# Patient Record
Sex: Female | Born: 1940 | Race: White | Hispanic: No | Smoking: Never smoker
Health system: Southern US, Community
[De-identification: ages and names within clinical notes are randomized; demographics above are authoritative.]

## PROBLEM LIST (undated history)

## (undated) DIAGNOSIS — I10 Essential (primary) hypertension: Secondary | ICD-10-CM

## (undated) DIAGNOSIS — Z780 Asymptomatic menopausal state: Secondary | ICD-10-CM

## (undated) DIAGNOSIS — M1990 Unspecified osteoarthritis, unspecified site: Secondary | ICD-10-CM

## (undated) DIAGNOSIS — E785 Hyperlipidemia, unspecified: Secondary | ICD-10-CM

## (undated) DIAGNOSIS — E079 Disorder of thyroid, unspecified: Secondary | ICD-10-CM

## (undated) DIAGNOSIS — M8580 Other specified disorders of bone density and structure, unspecified site: Secondary | ICD-10-CM

## (undated) DIAGNOSIS — F329 Major depressive disorder, single episode, unspecified: Secondary | ICD-10-CM

## (undated) DIAGNOSIS — I4891 Unspecified atrial fibrillation: Secondary | ICD-10-CM

## (undated) DIAGNOSIS — L301 Dyshidrosis [pompholyx]: Secondary | ICD-10-CM

## (undated) DIAGNOSIS — IMO0001 Reserved for inherently not codable concepts without codable children: Secondary | ICD-10-CM

## (undated) DIAGNOSIS — C07 Malignant neoplasm of parotid gland: Secondary | ICD-10-CM

## (undated) DIAGNOSIS — F419 Anxiety disorder, unspecified: Secondary | ICD-10-CM

## (undated) DIAGNOSIS — H269 Unspecified cataract: Secondary | ICD-10-CM

## (undated) DIAGNOSIS — T7840XA Allergy, unspecified, initial encounter: Secondary | ICD-10-CM

## (undated) DIAGNOSIS — E119 Type 2 diabetes mellitus without complications: Secondary | ICD-10-CM

## (undated) HISTORY — DX: Allergy, unspecified, initial encounter: T78.40XA

## (undated) HISTORY — DX: Anxiety disorder, unspecified: F41.9

## (undated) HISTORY — DX: Essential (primary) hypertension: I10

## (undated) HISTORY — PX: EYE SURGERY: SHX253

## (undated) HISTORY — DX: Other specified disorders of bone density and structure, unspecified site: M85.80

## (undated) HISTORY — DX: Type 2 diabetes mellitus without complications: E11.9

## (undated) HISTORY — PX: LUMBAR DISC SURGERY: SHX700

## (undated) HISTORY — PX: ABLATION: SHX5711

## (undated) HISTORY — DX: Disorder of thyroid, unspecified: E07.9

## (undated) HISTORY — PX: ROTATOR CUFF REPAIR: SHX139

## (undated) HISTORY — DX: Hyperlipidemia, unspecified: E78.5

## (undated) HISTORY — PX: ABDOMINAL HYSTERECTOMY: SHX81

## (undated) HISTORY — DX: Dyshidrosis (pompholyx): L30.1

## (undated) HISTORY — DX: Asymptomatic menopausal state: Z78.0

## (undated) HISTORY — DX: Major depressive disorder, single episode, unspecified: F32.9

## (undated) HISTORY — PX: OTHER SURGICAL HISTORY: SHX169

## (undated) HISTORY — DX: Unspecified atrial fibrillation: I48.91

## (undated) HISTORY — DX: Malignant neoplasm of parotid gland: C07

## (undated) HISTORY — DX: Unspecified cataract: H26.9

## (undated) HISTORY — DX: Reserved for inherently not codable concepts without codable children: IMO0001

## (undated) HISTORY — DX: Unspecified osteoarthritis, unspecified site: M19.90

## (undated) HISTORY — PX: TUBAL LIGATION: SHX77

---

## 1997-12-08 ENCOUNTER — Other Ambulatory Visit: Admission: RE | Admit: 1997-12-08 | Discharge: 1997-12-08 | Payer: Self-pay

## 1999-01-05 ENCOUNTER — Other Ambulatory Visit: Admission: RE | Admit: 1999-01-05 | Discharge: 1999-01-05 | Payer: Self-pay | Admitting: Family Medicine

## 2000-01-19 ENCOUNTER — Other Ambulatory Visit: Admission: RE | Admit: 2000-01-19 | Discharge: 2000-01-19 | Payer: Self-pay | Admitting: Family Medicine

## 2001-02-11 ENCOUNTER — Other Ambulatory Visit: Admission: RE | Admit: 2001-02-11 | Discharge: 2001-02-11 | Payer: Self-pay | Admitting: Family Medicine

## 2002-05-18 ENCOUNTER — Other Ambulatory Visit: Admission: RE | Admit: 2002-05-18 | Discharge: 2002-05-18 | Payer: Self-pay | Admitting: Obstetrics and Gynecology

## 2003-06-09 ENCOUNTER — Other Ambulatory Visit: Admission: RE | Admit: 2003-06-09 | Discharge: 2003-06-09 | Payer: Self-pay | Admitting: Obstetrics and Gynecology

## 2004-07-25 ENCOUNTER — Other Ambulatory Visit: Admission: RE | Admit: 2004-07-25 | Discharge: 2004-07-25 | Payer: Self-pay | Admitting: Obstetrics and Gynecology

## 2005-01-01 ENCOUNTER — Other Ambulatory Visit: Admission: RE | Admit: 2005-01-01 | Discharge: 2005-01-01 | Payer: Self-pay | Admitting: Obstetrics and Gynecology

## 2008-03-03 ENCOUNTER — Ambulatory Visit: Payer: Self-pay | Admitting: Cardiology

## 2008-03-15 ENCOUNTER — Ambulatory Visit: Payer: Self-pay

## 2009-08-19 ENCOUNTER — Encounter (INDEPENDENT_AMBULATORY_CARE_PROVIDER_SITE_OTHER): Payer: Self-pay | Admitting: *Deleted

## 2009-10-20 ENCOUNTER — Encounter (INDEPENDENT_AMBULATORY_CARE_PROVIDER_SITE_OTHER): Payer: Self-pay | Admitting: *Deleted

## 2009-10-20 ENCOUNTER — Ambulatory Visit: Payer: Self-pay | Admitting: Gastroenterology

## 2009-10-31 ENCOUNTER — Ambulatory Visit: Payer: Self-pay | Admitting: Gastroenterology

## 2009-11-01 ENCOUNTER — Encounter: Payer: Self-pay | Admitting: Gastroenterology

## 2010-08-01 NOTE — Procedures (Signed)
Summary: Colonoscopy  Patient: [name] Note: All result statuses are Final unless otherwise noted.  Tests: (1) Colonoscopy (COL)   COL Colonoscopy           DONE      [facility]     [address]     [place], [place]  [address]           COLONOSCOPY PROCEDURE REPORT           PATIENT:  Meredith Anderson, Meredith Anderson  MR#:  [id number]     BIRTHDATE:  [birth year], 68 yrs. old  GENDER:  female     ENDOSCOPIST:  Vania Rea. Jarold Motto, MD, [facility]     REF. BY:  Rudi Heap, M.D.     PROCEDURE DATE:  10/31/2009     PROCEDURE:  Colonoscopy with snare polypectomy, Average-risk     screening colonoscopy     G0121     ASA CLASS:  Class III     INDICATIONS:  Routine Risk Screening     MEDICATIONS:   Fentanyl 25 mcg IV, Versed 3 mg IV           DESCRIPTION OF PROCEDURE:   After the risks benefits and     alternatives of the procedure were thoroughly explained, informed     consent was obtained.  Digital rectal exam was performed and     revealed no abnormalities.   The LB [id number] endoscope     was introduced through the anus and advanced to the cecum, which     was identified by both the appendix and ileocecal valve, without     limitations.  The quality of the prep was excellent, using     MoviPrep.  The instrument was then slowly withdrawn as the colon     was fully examined.     <<PROCEDUREIMAGES>>           FINDINGS:  A diminutive polyp was found in the cecum. cold snare     excised and retrieved.   Retroflexed views in the rectum revealed     no abnormalities.    The scope was then withdrawn from the patient     and the procedure completed.           COMPLICATIONS:  None     ENDOSCOPIC IMPRESSION:     1) Diminutive polyp in the cecum     R/O ADENOMA.     RECOMMENDATIONS:     RESUME COUMADIN.PRN F/U     REPEAT EXAM:  No           ______________________________     Vania Rea. Jarold Motto, MD, Clement[facility] Graham           CC:           n.     eSIGNED:   Vania Rea. Roise Emert at 10/31/2009 10:21  AM           Eulis Foster, [id number]  Note: An exclamation mark (!) indicates a result that was not dispersed into the flowsheet. Document Creation Date: 10/31/2009 10:21 AM _______________________________________________________________________  (1) Order result status: Final Collection or observation date-time: 10/31/2009 10:14 Requested date-time:  Receipt date-time:  Reported date-time:  Referring Physician:   Ordering Physician: Sheryn Bison [phone]) Specimen Source:  Source: Launa Grill Order Number: [id number] Lab site:   Appended Document: Colonoscopy     Procedures Next Due Date:    Colonoscopy: 10/2014

## 2010-08-01 NOTE — Letter (Signed)
Summary: [facility] Instructions  [facility]  [address] [place], [place] [address]   Phone: [phone]  Fax: [phone]       Meredith Anderson    1941/05/06    MRN: [id number]        Procedure Day /Date: Monday, 10/31/09     Arrival Time: 8:30     Procedure Time: 9:30     Location of Procedure:                    [facility] ([facility] _                       PREPARATION FOR COLONOSCOPY WITH MOVIPREP   Starting 5 days prior to your procedure 10/26/09 do not eat nuts, seeds, popcorn, corn, beans, peas,  salads, or any raw vegetables.  Do not take any fiber supplements (e.g. Metamucil, Citrucel, and Benefiber).  THE DAY BEFORE YOUR PROCEDURE         DATE: 10/30/09   DAY: Sunday  1.  Drink clear liquids the entire day-NO SOLID FOOD  2.  Do not drink anything colored red or purple.  Avoid juices with pulp.  No orange juice.  3.  Drink at least 64 oz. (8 glasses) of fluid/clear liquids during the day to prevent dehydration and help the prep work efficiently.  CLEAR LIQUIDS INCLUDE: Water Jello Ice Popsicles Tea (sugar ok, no milk/cream) Powdered fruit flavored drinks Coffee (sugar ok, no milk/cream) Gatorade Juice: apple, white grape, white cranberry  Lemonade Clear bullion, consomm, broth Carbonated beverages (any kind) Strained chicken noodle soup Hard Candy                             4.  In the morning, mix first dose of MoviPrep solution:    Empty 1 Pouch A and 1 Pouch B into the disposable container    Add lukewarm drinking water to the top line of the container. Mix to dissolve    Refrigerate (mixed solution should be used within 24 hrs)  5.  Begin drinking the prep at 5:00 p.m. The MoviPrep container is divided by 4 marks.   Every 15 minutes drink the solution down to the next mark (approximately 8 oz) until the full liter is complete.   6.  Follow completed prep with 16 oz of clear liquid of your choice (Nothing red or  purple).  Continue to drink clear liquids until bedtime.  7.  Before going to bed, mix second dose of MoviPrep solution:    Empty 1 Pouch A and 1 Pouch B into the disposable container    Add lukewarm drinking water to the top line of the container. Mix to dissolve    Refrigerate  THE DAY OF YOUR PROCEDURE      DATE: 10/31/09   DAY: Monday  Beginning at 4:30 a.m. (5 hours before procedure):         1. Every 15 minutes, drink the solution down to the next mark (approx 8 oz) until the full liter is complete.  2. Follow completed prep with 16 oz. of clear liquid of your choice.    3. You may drink clear liquids until 7:30 (2 HOURS BEFORE PROCEDURE).   MEDICATION INSTRUCTIONS  Unless otherwise instructed, you should take regular prescription medications with a small sip of water   as early as possible the morning of  your procedure.  Diabetic patients - see separate instructions.      Stop taking Coumadin on  10/26/09  (5 days before procedure).           OTHER INSTRUCTIONS  You will need a responsible adult at least 70 years of age to accompany you and drive you home.   This person must remain in the waiting room during your procedure.  Wear loose fitting clothing that is easily removed.  Leave jewelry and other valuables at home.  However, you may wish to bring a book to read or  an iPod/MP3 player to listen to music as you wait for your procedure to start.  Remove all body piercing jewelry and leave at home.  Total time from sign-in until discharge is approximately 2-3 hours.  You should go home directly after your procedure and rest.  You can resume normal activities the  day after your procedure.  The day of your procedure you should not:   Drive   Make legal decisions   Operate machinery   Drink alcohol   Return to work  You will receive specific instructions about eating, activities and medications before you leave.    The above instructions have  been reviewed and explained to me by   _______________________    I fully understand and can verbalize these instructions _____________________________ Date _________

## 2010-08-01 NOTE — Letter (Signed)
Summary: Diabetic Instructions  [facility]  [address] [place], [place] [address]   Phone: [phone]  Fax: [phone]    Meredith Anderson January 29, 1941 MRN: [id number]   _ X _   ORAL DIABETIC MEDICATION INSTRUCTIONS  The day before your procedure:   Take your diabetic pill as you do normally  The day of your procedure:   Do not take your diabetic pill    We will check your blood sugar levels during the admission process and again in Recovery before discharging you home  ________________________________________________________________________  _  _   INSULIN (LONG ACTING) MEDICATION INSTRUCTIONS (Lantus, NPH, 70/30, Humulin, Novolin-N)   The day before your procedure:   Take  your regular evening dose    The day of your procedure:   Do not take your morning dose    _  _   INSULIN (SHORT ACTING) MEDICATION INSTRUCTIONS (Regular, Humulog, Novolog)   The day before your procedure:   Do not take your evening dose   The day of your procedure:   Do not take your morning dose   _  _   INSULIN PUMP MEDICATION INSTRUCTIONS  We will contact the physician managing your diabetic care for written dosage instructions for the day before your procedure and the day of your procedure.  Once we have received the instructions, we will contact you.

## 2010-08-01 NOTE — Assessment & Plan Note (Signed)
Summary: discuss colon on BT--ch.   History of Present Illness Visit Type: Initial Consult Primary GI MD: Sheryn Bison MD FACP FAGA Primary Provider: Rudi Heap, MD Requesting Provider: Rudi Heap, MD Chief Complaint: screening for colonoscopy on blood thinner History of Present Illness:   Pleasant 70 year old white female referred by Dr. Alden Server for possible screening colonoscopy.  This patient is a middle-aged Caucasian female with adult onset diabetes, atrial fibrillation requiring Coumadin therapy, chronic thyroid dysfunction, hypertension, hyperlipidemia, osteoporosis, and peripheral arthritis. She denies any GI complaints but has never had screening colonoscopy. She specifically denies abdominal pain, change in bowel habits, upper abdominal or hepatobiliary symptomatology. Her appetite is good and her weight is stable. Family history is noncontributory.  She is followed at [facility] by Dr. Rozell Searing for atrial fibrillation. She has not had previous cardioversion, and she denies a previous history of embolic problems. She also denies a current cardiovascular or pulmonary complaints. In addition to Coumadin she is on a daily aspirin 81 mg. She relates that Hemoccult cards in the past been guaiac-negative, and she has no history of anemia.   GI Review of Systems    Reports acid reflux and  bloating.      Denies abdominal pain, belching, chest pain, dysphagia with liquids, dysphagia with solids, heartburn, loss of appetite, nausea, vomiting, vomiting blood, weight loss, and  weight gain.      Reports diarrhea and  hemorrhoids.     Denies anal fissure, black tarry stools, change in bowel habit, constipation, diverticulosis, fecal incontinence, heme positive stool, irritable bowel syndrome, jaundice, light color stool, liver problems, rectal bleeding, and  rectal pain.    Current Medications (verified): 1)  Aspir-Low 81 Mg Tbec (Aspirin) .... Daily 2)  Warfarin  Sodium 2.5 Mg Tabs (Warfarin Sodium) .... Qd 3)  Pantoprazole Sodium 40 Mg Tbec (Pantoprazole Sodium) .... Qd 4)  Glipizide Xl 5 Mg Xr24h-Tab (Glipizide) .... Qd 5)  Actos 30 Mg Tabs (Pioglitazone Hcl) .... 1/2 Qd 6)  Sotalol Hcl 80 Mg Tabs (Sotalol Hcl) .... Bid 7)  Levoxyl 88 Mcg Tabs (Levothyroxine Sodium) .... Qd 8)  Zestoretic 20-25 Mg Tabs (Lisinopril-Hydrochlorothiazide) .... Qd 9)  Actos 15 Mg Tabs (Pioglitazone Hcl) .Marland Kitchen.. 1 Tablet By Mouth Once Daily 10)  Lipitor 10 Mg Tabs (Atorvastatin Calcium) .Marland Kitchen.. 1 Tablet By Mouth Once Daily 11)  Calcium 600+d Plus Minerals 600-400 Mg-Unit Tabs (Calcium Carbonate-Vit D-Min) .Marland Kitchen.. 1 Tablet By Mouth Once Weekly  Allergies (verified): No Known Drug Allergies  Past History:  Past medical, surgical, family and social histories (including risk factors) reviewed for relevance to current acute and chronic problems.  Past Medical History: Arthritis Atrial Fibrillation Diabetes Hemorrhoids Hyperlipidemia Hypertension Left Parotiod Radiation Hypothyroidism  Past Surgical History: Tubal Ligation Catarqct surgery x 2  Rotator cuff surgery  Rt. Shoulder Disc surgery 4-5   Family History: Reviewed history and no changes required. Family History of Diabetes: daughter, sisters , brother and mother Family History of Heart Disease: father No FH of Colon Cancer:  Social History: Reviewed history and no changes required. Divorced 2 girls Retired Patient has never smoked.  Alcohol Use - no Daily Caffeine Use 2 per day Illicit Drug Use - no Smoking Status:  never Drug Use:  no  Review of Systems       The patient complains of allergy/sinus, arthritis/joint pain, back pain, and change in vision.  The patient denies anemia, anxiety-new, blood in urine, breast changes/lumps, confusion, cough, coughing up blood, depression-new, fainting, fatigue,  fever, headaches-new, hearing problems, heart murmur, heart rhythm changes, itching, muscle  pains/cramps, night sweats, nosebleeds, shortness of breath, skin rash, sleeping problems, sore throat, swelling of feet/legs, swollen lymph glands, thirst - excessive, urination - excessive, urination changes/pain, urine leakage, vision changes, and voice change.    Vital Signs:  Patient profile:   70 year old female Height:      64 inches Weight:      173 pounds BMI:     29.80 Pulse rate:   56 / minute Pulse rhythm:   regular BP sitting:   152 / 84  (left arm)  Vitals Entered By: Milford Cage NCMA (October 20, 2009 9:48 AM)  Physical Exam  General:  Well developed, well nourished, no acute distress.healthy appearing.   Head:  Normocephalic and atraumatic. Eyes:  PERRLA, no icterus.exam deferred to patient's ophthalmologist.   Neck:  Supple; no masses or thyromegaly. Lungs:  Clear throughout to auscultation. Heart:  ; no murmurs, rubs,  or bruits.Distant heart sounds noted.irregular rhythm:.   Abdomen:  Soft, nontender and nondistended. No masses, hepatosplenomegaly or hernias noted. Normal bowel sounds. Pulses:  Normal pulses noted. Extremities:  No clubbing, cyanosis, edema or deformities noted. Neurologic:  Alert and  oriented x4;  grossly normal neurologically. Cervical Nodes:  No significant cervical adenopathy. Psych:  Alert and cooperative. Normal mood and affect.   Impression & Recommendations:  Problem # 1:  SCREENING COLORECTAL-CANCER (ICD-V76.51) Assessment Unchanged Because of Her Age She will need screening colonoscopy despite increased risk from associated adult onset diabetes and her chronic Coumadin administration because of atrial fibrillation. We will make adjustments in her medications for colonoscopy procedure in preparation with a balanced electrolyte solution. Unless otherwise notified, we will hold her Coumadin 5 days before her procedure but continue her salicylate therapy. I've explained the risk and benefits of colonoscopy and possible polypectomy to the  patient and her daughter in detail. They've agreed to proceed as planned. Of note is the fact that the patient also is on pantoprazole therapy for reasons which are unclear after speaking with her. We will send this note to Dr.Karl at [facility] cardiology Department for review. She seems well compensated at this time in terms of any cardiovascular or pulmonary compromise. Also her diabetes apparently is under good control.  Patient Instructions: 1)  You are scheduled for a colonoscopy. 2)  You will need to stop coumadin 5 days before the procedure. 3)  Pick your prep up from your pharmacy,. 4)  The medication list was reviewed and reconciled.  All changed / newly prescribed medications were explained.  A complete medication list was provided to the patient / caregiver. 5)  Copy sent to : Dr. Rudi Heap and Dr. Rozell Searing division of cardiology at [facility]. 6)  Please continue current medications.  7)  Colonoscopy and Flexible Sigmoidoscopy brochure given.  8)  Conscious Sedation brochure given.   Appended Document: discuss colon on BT--ch.    Clinical Lists Changes  Medications: Added new medication of MOVIPREP 100 GM  SOLR (PEG-KCL-NACL-NASULF-NA ASC-C) As per prep instructions. - Signed Rx of MOVIPREP 100 GM  SOLR (PEG-KCL-NACL-NASULF-NA ASC-C) As per prep instructions.;  #1 x 0;  Signed;  Entered by: Ashok Cordia RN;  Authorized by: Mardella Layman MD [facility];  Method used: Electronically to [facility] [phone]*, [address], [place], [place], [place]  [address], Ph: [phone] or [phone], Fax: [phone] Orders: Added new Test order of Colonoscopy (  Colon) - Signed    Prescriptions: MOVIPREP 100 GM  SOLR (PEG-KCL-NACL-NASULF-NA ASC-C) As per prep instructions.  #1 x 0   Entered by:   Ashok Cordia RN   Authorized by:   Mardella Layman MD [facility]   Signed by:   Ashok Cordia RN on 10/20/2009   Method used:    Electronically to        [facility] [phone]* (retail)       [address] [place], [place]  [address]       Ph: [phone] or [phone]       Fax: [phone]   RxID:   [id number]

## 2010-08-01 NOTE — Letter (Signed)
Summary: Patient Notice- Polyp Results  [facility]  [address] [place], [place] [address]   Phone: [phone]  Fax: [phone]        Nov 01, 2009 MRN: [id number]    [facility] [address] [place], [place]  [address]    Dear Ms. Newsome,  I am pleased to inform you that the colon polyp(s) removed during your recent colonoscopy was (were) found to be benign (no cancer detected) upon pathologic examination.  I recommend you have a repeat colonoscopy examination in 5_ years to look for recurrent polyps, as having colon polyps increases your risk for having recurrent polyps or even colon cancer in the future.  Should you develop new or worsening symptoms of abdominal pain, bowel habit changes or bleeding from the rectum or bowels, please schedule an evaluation with either your primary care physician or with me.  Additional information/recommendations:  _x_ No further action with gastroenterology is needed at this time. Please      follow-up with your primary care physician for your other healthcare      needs.  __ Please call [phone] to schedule a return visit to review your      situation.  __ Please keep your follow-up visit as already scheduled.  __ Continue treatment plan as outlined the day of your exam.  Please call us if you are having persistent problems or have questions about your condition that have not been fully answered at this time.  Sincerely,  Mardella Layman MD [facility]  This letter has been electronically signed by your physician.  Appended Document: Patient Notice- Polyp Results letter mailed 5.5.11  Appended Document: Patient Notice- Polyp Results Nov 01, 2009 MRN: [id number]    [facility] [address] [place], [place]  [address]    Dear Ms. Kenedy,  I am pleased to inform you that the colon polyp(s) removed during your recent colonoscopy was (were) found to be benign (no cancer detected) upon pathologic  examination.  I recommend you have a repeat colonoscopy examination in 5_ years to look for recurrent polyps, as having colon polyps increases your risk for having recurrent polyps or even colon cancer in the future.  Should you develop new or worsening symptoms of abdominal pain, bowel habit changes or bleeding from the rectum or bowels, please schedule an evaluation with either your primary care physician or with me.  Additional information/recommendations:  _x_ No further action with gastroenterology is needed at this time. Please      follow-up with your primary care physician for your other healthcare      needs.  __ Please call [phone] to schedule a return visit to review your      situation.  __ Please keep your follow-up visit as already scheduled.  __ Continue treatment plan as outlined the day of your exam.  Please call us if you are having persistent problems or have questions about your condition that have not been fully answered at this time.  Sincerely,  Mardella Layman MD [facility]  This letter has been electronically signed by your physician.    MAILED TO PATIENTS P.O BOX

## 2010-08-01 NOTE — Letter (Signed)
Summary: New Patient letter  [facility]  [address] [place], [place] [address]   Phone: [phone]  Fax: [phone]       08/19/2009 MRN: [id number]  [facility] [place], [place]  [address]  Dear Meredith Anderson,  Welcome to the Gastroenterology Division at [facility].    You are scheduled to see Dr. Jarold Motto on 10-18-09 at 10:00a.m. on the 3rd floor at [facility], [address]  We ask that you try to arrive at our office 15 minutes prior to your appointment time to allow for check-in.  We would like you to complete the enclosed self-administered evaluation form prior to your visit and bring it with you on the day of your appointment.  We will review it with you.  Also, please bring a complete list of all your medications or, if you prefer, bring the medication bottles and we will list them.  Please bring your insurance card so that we may make a copy of it.  If your insurance requires a referral to see a specialist, please bring your referral form from your primary care physician.  Co-payments are due at the time of your visit and may be paid by cash, check or credit card.     Your office visit will consist of a consult with your physician (includes a physical exam), any laboratory testing he/she may order, scheduling of any necessary diagnostic testing (e.g. x-ray, ultrasound, CT-scan), and scheduling of a procedure (e.g. Endoscopy, Colonoscopy) if required.  Please allow enough time on your schedule to allow for any/all of these possibilities.    If you cannot keep your appointment, please call [phone] to cancel or reschedule prior to your appointment date.  This allows [place] the opportunity to schedule an appointment for another patient in need of care.  If you do not cancel or reschedule by 5 p.m. the business day prior to your appointment date, you will be charged a $50.00 late cancellation/no-show fee.    Thank you for  choosing [facility] for your medical needs.  We appreciate the opportunity to care for you.  Please visit [place] at our website  to learn more about our practice.                     Sincerely,                                                             The Gastroenterology Division

## 2010-11-14 NOTE — Assessment & Plan Note (Signed)
[facility]                            CARDIOLOGY OFFICE NOTE   Meredith Anderson, Meredith Anderson                         MRN:          [id number]  DATE:03/03/2008                            DOB:          [birth year]    REASON FOR PRESENTATION:  Evaluate the patient with chest pain.   HISTORY OF PRESENT ILLNESS:  The patient is 70 years old.  She has no  history of coronary artery diseases.  She has multiple cardiovascular  risk factors.  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  She was told she had heart murmurs as a child, but it went away as an  adult.  She has never had an echocardiogram, this is the reason to have  one.   On February 09, 2008, she developed chest discomfort.  This was at rest at  8:30 p.m.  She was watching TV.  She felt a severe chest tightness or  fullness.  It was left-sided.  It did not radiate to her jaw or to her  arms.  It was unlike previous indigestion, though she did feel like she  needed to burp.  She could not.  She did feel her heart racing.  She  said it was 10/10 in intensity.  She was short of breath.  There was no  associated nausea or vomiting.  It went away spontaneously.  She did  present it on the next day and was not found to have any acute findings.  She did not have any recurrence of this.  She states she is otherwise  able to be active and has been able to bring on these symptoms.  She  denies any resting PND or orthopnea.  She had no palpitations,  presyncope, or syncope.   PAST MEDICAL HISTORY:  Hypertension since 1980s, hyperlipidemia since  1990s, hypothyroidism, recently elevated blood sugar (though not a  fasting level).   PAST SURGICAL HISTORY:  Tubal; bunionectomy bilaterally; back surgery,  lumbar; and cataract surgery.   ALLERGIES:  None.   MEDICATIONS:  1. Benicar 20 mg daily (just started).  2. Zetia 10 mg daily.  3. Crestor 10 mg daily.  4. Aspirin 81 mg daily.  5. Tylenol.  6. Levothyroxine 75 mcg daily.  7. Lisinopril HCT 20/25 daily.   SOCIAL HISTORY:  The patient is retired.  She is single.  She has 2  children.  She never really smoked cigarettes and does not drink  alcohol.   FAMILY HISTORY:  Contributory for her brother having emergent bypass in  his 15s.  Her father had heart disease at a later age, though she cannot  describe this in detail.   REVIEW OF SYSTEMS:  Positive for occasional dizziness, varicose veins,  and reflux.  Negative for all other systems.   PHYSICAL EXAMINATION:  GENERAL:  The patient is in no distress.  VITAL SIGNS:  Blood pressure 150/88, heart rate 76 and regular, weight  175 pounds, and body mass index 29.  HEENT:  Eyes unremarkable.  Pupils equal, round, and reactive to light.  Fundi not visualized.  Oral mucosa unremarkable.  NECK:  No jugular venous distension at 45 degrees, carotid upstroke  brisk and symmetrical.  No bruit.  No thyromegaly.  LYMPHATIC:  No cervical, axillary, or inguinal lymphadenopathy.  LUNGS:  Clear to auscultation bilaterally.  BACK:  No costovertebral angle tenderness.  CHEST:  Unremarkable.  HEART:  PMI not displaced or sustained.  S1 and S2 within normal limits.  No S3.  No S4.  No clicks.  No rubs.  No murmurs.  ABDOMEN:  Flat, positive bowel sounds, normal in frequency and pitch.  No bruits.  No rebound.  No guarding.  No midline pulsatile mass.  No  hepatomegaly.  No splenomegaly.  SKIN:  No rashes.  No nodules.  EXTREMITIES:  Pulses 2+ throughout.  No edema.  No cyanosis or clubbing.  NEUROLOGIC:  Oriented to place, person, and time.  Cranial nerves II  through XII grossly intact.  Motor grossly intact.   LABORATORY DATA:  EKG (done by Ms. Steadman) sinus rhythm, rate 64, axis  within normal limits, intervals within normal limits, poor anterior R-  wave progression.  No acute ST-T wave changes.   ASSESSMENT AND PLAN:   1. Chest discomfort.  The patient's chest discomfort has some      worrisome features.  She has a strong family history of coronary      disease.  She has multiple cardiovascular risk factors that she is      not taking care of aggressively.  Given this, her pretest probably      of obstructive coronary artery disease is at least moderately high.      Therefore, screening with a stress perfusion study is indicated.      The patient would be able to walk on a treadmill and will be      scheduled for an exercise perfusion study.  2. Hypertension.  Blood pressure is elevated.  She was recently      started on the Benicar.  I will defer to Ms. Steadman.  I would      suggest based on recent trials to use a combination other than ACEs      plus ARBs.  Perhaps, spironolactone or calcium channel blocker.  3. Non-insulin-dependent diabetes mellitus.  It has been diet      controlled in the past.  She is going to have this followed by Ms.      Bevelyn Buckles and is planning to have repeat fasting blood work.  4. Dyslipidemia.  I would say given her diabetes and family history,      the another risk factor of the goal should be an LDL less than 100      and an HDL greater than 50.  5. Hypothyroidism.  The patient is to continue on her Synthroid.   FOLLOWUP:  I will see the patient again as needed based on the results  of the above tests or future symptoms.     Rollene Rotunda, MD, [facility]  Electronically Signed    JH/MedQ  DD: 03/03/2008  DT: 03/04/2008  Job #: [id number]   cc:   Paulita Cradle

## 2010-11-15 ENCOUNTER — Encounter: Payer: Self-pay | Admitting: Family Medicine

## 2011-07-30 DIAGNOSIS — Z9889 Other specified postprocedural states: Secondary | ICD-10-CM | POA: Insufficient documentation

## 2011-07-30 DIAGNOSIS — E119 Type 2 diabetes mellitus without complications: Secondary | ICD-10-CM | POA: Insufficient documentation

## 2011-11-27 DIAGNOSIS — IMO0001 Reserved for inherently not codable concepts without codable children: Secondary | ICD-10-CM | POA: Insufficient documentation

## 2012-02-15 DIAGNOSIS — M545 Low back pain, unspecified: Secondary | ICD-10-CM | POA: Insufficient documentation

## 2012-02-15 DIAGNOSIS — M5136 Other intervertebral disc degeneration, lumbar region: Secondary | ICD-10-CM | POA: Insufficient documentation

## 2012-10-13 ENCOUNTER — Other Ambulatory Visit: Payer: Self-pay | Admitting: *Deleted

## 2012-10-13 MED ORDER — ATORVASTATIN CALCIUM 10 MG PO TABS: 10.0000 mg | ORAL_TABLET | Freq: Every day | ORAL | Status: DC

## 2012-10-27 DIAGNOSIS — H5319 Other subjective visual disturbances: Secondary | ICD-10-CM | POA: Insufficient documentation

## 2012-10-27 DIAGNOSIS — L659 Nonscarring hair loss, unspecified: Secondary | ICD-10-CM | POA: Insufficient documentation

## 2012-11-03 ENCOUNTER — Ambulatory Visit (INDEPENDENT_AMBULATORY_CARE_PROVIDER_SITE_OTHER): Payer: Medicare Other | Admitting: Pharmacist

## 2012-11-03 DIAGNOSIS — Z7901 Long term (current) use of anticoagulants: Secondary | ICD-10-CM | POA: Insufficient documentation

## 2012-11-03 DIAGNOSIS — I455 Other specified heart block: Secondary | ICD-10-CM | POA: Insufficient documentation

## 2012-11-03 DIAGNOSIS — I4891 Unspecified atrial fibrillation: Secondary | ICD-10-CM

## 2012-11-03 LAB — POCT INR: INR: 2.1

## 2012-11-03 NOTE — Patient Instructions (Addendum)
Anticoagulation Dose Instructions as of 11/03/2012     Meredith Anderson Tue Wed Thu Fri Sat   New Dose 2.5 mg 2.5 mg 2.5 mg 2.5 mg 2.5 mg 1.25 mg 2.5 mg    Description       Continue same dose. Recheck protime in 6 weeks       INR was 2.1 today

## 2012-11-05 ENCOUNTER — Ambulatory Visit (INDEPENDENT_AMBULATORY_CARE_PROVIDER_SITE_OTHER): Payer: Medicare Other | Admitting: General Practice

## 2012-11-05 VITALS — BP 159/78 | HR 59 | Temp 97.6°F | Ht 64.0 in | Wt 155.0 lb

## 2012-11-05 DIAGNOSIS — E119 Type 2 diabetes mellitus without complications: Secondary | ICD-10-CM

## 2012-11-05 DIAGNOSIS — I10 Essential (primary) hypertension: Secondary | ICD-10-CM

## 2012-11-05 DIAGNOSIS — J322 Chronic ethmoidal sinusitis: Secondary | ICD-10-CM

## 2012-11-05 DIAGNOSIS — E785 Hyperlipidemia, unspecified: Secondary | ICD-10-CM

## 2012-11-05 LAB — POCT CBC
Granulocyte percent: 73.4 %G (ref 37–80)
Lymph, poc: 1.3 (ref 0.6–3.4)
MCH, POC: 28.8 pg (ref 27–31.2)
MCHC: 32.3 g/dL (ref 31.8–35.4)
MCV: 89.4 fL (ref 80–97)
Platelet Count, POC: 202 10*3/uL (ref 142–424)
RDW, POC: 14.8 %
WBC: 5 10*3/uL (ref 4.6–10.2)

## 2012-11-05 LAB — COMPLETE METABOLIC PANEL WITH GFR
ALT: 41 U/L — ABNORMAL HIGH (ref 0–35)
AST: 42 U/L — ABNORMAL HIGH (ref 0–37)
Albumin: 4.4 g/dL (ref 3.5–5.2)
Alkaline Phosphatase: 98 U/L (ref 39–117)
Calcium: 9.4 mg/dL (ref 8.4–10.5)
Chloride: 105 mEq/L (ref 96–112)
Potassium: 3.7 mEq/L (ref 3.5–5.3)
Sodium: 139 mEq/L (ref 135–145)

## 2012-11-05 LAB — LIPID PANEL
LDL Cholesterol: 58 mg/dL (ref 0–99)
Total CHOL/HDL Ratio: 3.5 Ratio
VLDL: 28 mg/dL (ref 0–40)

## 2012-11-05 MED ORDER — AMOXICILLIN 500 MG PO CAPS: 500.0000 mg | ORAL_CAPSULE | Freq: Two times a day (BID) | ORAL | Status: DC

## 2012-11-05 NOTE — Patient Instructions (Addendum)
Sinusitis Sinusitis is redness, soreness, and swelling (inflammation) of the paranasal sinuses. Paranasal sinuses are air pockets within the bones of your face (beneath the eyes, the middle of the forehead, or above the eyes). In healthy paranasal sinuses, mucus is able to drain out, and air is able to circulate through them by way of your nose. However, when your paranasal sinuses are inflamed, mucus and air can become trapped. This can allow bacteria and other germs to grow and cause infection. Sinusitis can develop quickly and last only a short time (acute) or continue over a long period (chronic). Sinusitis that lasts for more than 12 weeks is considered chronic.  CAUSES  Causes of sinusitis include:  Allergies.  Structural abnormalities, such as displacement of the cartilage that separates your nostrils (deviated septum), which can decrease the air flow through your nose and sinuses and affect sinus drainage.  Functional abnormalities, such as when the small hairs (cilia) that line your sinuses and help remove mucus do not work properly or are not present. SYMPTOMS  Symptoms of acute and chronic sinusitis are the same. The primary symptoms are pain and pressure around the affected sinuses. Other symptoms include:  Upper toothache.  Earache.  Headache.  Bad breath.  Decreased sense of smell and taste.  A cough, which worsens when you are lying flat.  Fatigue.  Fever.  Thick drainage from your nose, which often is green and may contain pus (purulent).  Swelling and warmth over the affected sinuses. DIAGNOSIS  Your caregiver will perform a physical exam. During the exam, your caregiver may:  Look in your nose for signs of abnormal growths in your nostrils (nasal polyps).  Tap over the affected sinus to check for signs of infection.  View the inside of your sinuses (endoscopy) with a special imaging device with a light attached (endoscope), which is inserted into your  sinuses. If your caregiver suspects that you have chronic sinusitis, one or more of the following tests may be recommended:  Allergy tests.  Nasal culture A sample of mucus is taken from your nose and sent to a lab and screened for bacteria.  Nasal cytology A sample of mucus is taken from your nose and examined by your caregiver to determine if your sinusitis is related to an allergy. TREATMENT  Most cases of acute sinusitis are related to a viral infection and will resolve on their own within 10 days. Sometimes medicines are prescribed to help relieve symptoms (pain medicine, decongestants, nasal steroid sprays, or saline sprays).  However, for sinusitis related to a bacterial infection, your caregiver will prescribe antibiotic medicines. These are medicines that will help kill the bacteria causing the infection.  Rarely, sinusitis is caused by a fungal infection. In theses cases, your caregiver will prescribe antifungal medicine. For some cases of chronic sinusitis, surgery is needed. Generally, these are cases in which sinusitis recurs more than 3 times per year, despite other treatments. HOME CARE INSTRUCTIONS   Drink plenty of water. Water helps thin the mucus so your sinuses can drain more easily.  Use a humidifier.  Inhale steam 3 to 4 times a day (for example, sit in the bathroom with the shower running).  Apply a warm, moist washcloth to your face 3 to 4 times a day, or as directed by your caregiver.  Use saline nasal sprays to help moisten and clean your sinuses.  Take over-the-counter or prescription medicines for pain, discomfort, or fever only as directed by your caregiver. SEEK IMMEDIATE MEDICAL   CARE IF:  You have increasing pain or severe headaches.  You have nausea, vomiting, or drowsiness.  You have swelling around your face.  You have vision problems.  You have a stiff neck.  You have difficulty breathing. MAKE SURE YOU:   Understand these  instructions.  Will watch your condition.  Will get help right away if you are not doing well or get worse. Document Released: 06/18/2005 Document Revised: 09/10/2011 Document Reviewed: 07/03/2011 [facility] Patient Information 2013 [place], [place]. Hypertriglyceridemia  Diet for High blood levels of Triglycerides Most fats in food are triglycerides. Triglycerides in your blood are stored as fat in your body. High levels of triglycerides in your blood may put you at a greater risk for heart disease and stroke.  Normal triglyceride levels are less than 150 mg/dL. Borderline high levels are 150-199 mg/dl. High levels are 200 - 499 mg/dL, and very high triglyceride levels are greater than 500 mg/dL. The decision to treat high triglycerides is generally based on the level. For people with borderline or high triglyceride levels, treatment includes weight loss and exercise. Drugs are recommended for people with very high triglyceride levels. Many people who need treatment for high triglyceride levels have metabolic syndrome. This syndrome is a collection of disorders that often include: insulin resistance, high blood pressure, blood clotting problems, high cholesterol and triglycerides. TESTING PROCEDURE FOR TRIGLYCERIDES  You should not eat 4 hours before getting your triglycerides measured. The normal range of triglycerides is between 10 and 250 milligrams per deciliter (mg/dl). Some people may have extreme levels (1000 or above), but your triglyceride level may be too high if it is above 150 mg/dl, depending on what other risk factors you have for heart disease.  People with high blood triglycerides may also have high blood cholesterol levels. If you have high blood cholesterol as well as high blood triglycerides, your risk for heart disease is probably greater than if you only had high triglycerides. High blood cholesterol is one of the main risk factors for heart disease. CHANGING YOUR DIET  Your  weight can affect your blood triglyceride level. If you are more than 20% above your ideal body weight, you may be able to lower your blood triglycerides by losing weight. Eating less and exercising regularly is the best way to combat this. Fat provides more calories than any other food. The best way to lose weight is to eat less fat. Only 30% of your total calories should come from fat. Less than 7% of your diet should come from saturated fat. A diet low in fat and saturated fat is the same as a diet to decrease blood cholesterol. By eating a diet lower in fat, you may lose weight, lower your blood cholesterol, and lower your blood triglyceride level.  Eating a diet low in fat, especially saturated fat, may also help you lower your blood triglyceride level. Ask your dietitian to help you figure how much fat you can eat based on the number of calories your caregiver has prescribed for you.  Exercise, in addition to helping with weight loss may also help lower triglyceride levels.   Alcohol can increase blood triglycerides. You may need to stop drinking alcoholic beverages.  Too much carbohydrate in your diet may also increase your blood triglycerides. Some complex carbohydrates are necessary in your diet. These may include bread, rice, potatoes, other starchy vegetables and cereals.  Reduce "simple" carbohydrates. These may include pure sugars, candy, honey, and jelly without losing other nutrients. If you  have the kind of high blood triglycerides that is affected by the amount of carbohydrates in your diet, you will need to eat less sugar and less high-sugar foods. Your caregiver can help you with this.  Adding 2-4 grams of fish oil (EPA+ DHA) may also help lower triglycerides. Speak with your caregiver before adding any supplements to your regimen. Following the Diet  Maintain your ideal weight. Your caregivers can help you with a diet. Generally, eating less food and getting more exercise will help  you lose weight. Joining a weight control group may also help. Ask your caregivers for a good weight control group in your area.  Eat low-fat foods instead of high-fat foods. This can help you lose weight too.  These foods are lower in fat. Eat MORE of these:   Dried beans, peas, and lentils.  Egg whites.  Low-fat cottage cheese.  Fish.  Lean cuts of meat, such as round, sirloin, rump, and flank (cut extra fat off meat you fix).  Whole grain breads, cereals and pasta.  Skim and nonfat dry milk.  Low-fat yogurt.  Poultry without the skin.  Cheese made with skim or part-skim milk, such as mozzarella, parmesan, farmers', ricotta, or pot cheese. These are higher fat foods. Eat LESS of these:   Whole milk and foods made from whole milk, such as American, blue, cheddar, monterey jack, and swiss cheese  High-fat meats, such as luncheon meats, sausages, knockwurst, bratwurst, hot dogs, ribs, corned beef, ground pork, and regular ground beef.  Fried foods. Limit saturated fats in your diet. Substituting unsaturated fat for saturated fat may decrease your blood triglyceride level. You will need to read package labels to know which products contain saturated fats.  These foods are high in saturated fat. Eat LESS of these:   Fried pork skins.  Whole milk.  Skin and fat from poultry.  Palm oil.  Butter.  Shortening.  Cream cheese.  Tomasa Blase.  Margarines and baked goods made from listed oils.  Vegetable shortenings.  Chitterlings.  Fat from meats.  Coconut oil.  Palm kernel oil.  Lard.  Cream.  Sour cream.  Fatback.  Coffee whiteners and non-dairy creamers made with these oils.  Cheese made from whole milk. Use unsaturated fats (both polyunsaturated and monounsaturated) moderately. Remember, even though unsaturated fats are better than saturated fats; you still want a diet low in total fat.  These foods are high in unsaturated fat:   Canola oil.  Sunflower  oil.  Mayonnaise.  Almonds.  Peanuts.  Pine nuts.  Margarines made with these oils.  Safflower oil.  Olive oil.  Avocados.  Cashews.  Peanut butter.  Sunflower seeds.  Soybean oil.  Peanut oil.  Olives.  Pecans.  Walnuts.  Pumpkin seeds. Avoid sugar and other high-sugar foods. This will decrease carbohydrates without decreasing other nutrients. Sugar in your food goes rapidly to your blood. When there is excess sugar in your blood, your liver may use it to make more triglycerides. Sugar also contains calories without other important nutrients.  Eat LESS of these:   Sugar, brown sugar, powdered sugar, jam, jelly, preserves, honey, syrup, molasses, pies, candy, cakes, cookies, frosting, pastries, colas, soft drinks, punches, fruit drinks, and regular gelatin.  Avoid alcohol. Alcohol, even more than sugar, may increase blood triglycerides. In addition, alcohol is high in calories and low in nutrients. Ask for sparkling water, or a diet soft drink instead of an alcoholic beverage. Suggestions for planning and preparing meals   Bake, broil,  grill or roast meats instead of frying.  Remove fat from meats and skin from poultry before cooking.  Add spices, herbs, lemon juice or vinegar to vegetables instead of salt, rich sauces or gravies.  Use a non-stick skillet without fat or use no-stick sprays.  Cool and refrigerate stews and broth. Then remove the hardened fat floating on the surface before serving.  Refrigerate meat drippings and skim off fat to make low-fat gravies.  Serve more fish.  Use less butter, margarine and other high-fat spreads on bread or vegetables.  Use skim or reconstituted non-fat dry milk for cooking.  Cook with low-fat cheeses.  Substitute low-fat yogurt or cottage cheese for all or part of the sour cream in recipes for sauces, dips or congealed salads.  Use half yogurt/half mayonnaise in salad recipes.  Substitute evaporated skim  milk for cream. Evaporated skim milk or reconstituted non-fat dry milk can be whipped and substituted for whipped cream in certain recipes.  Choose fresh fruits for dessert instead of high-fat foods such as pies or cakes. Fruits are naturally low in fat. When Dining Out   Order low-fat appetizers such as fruit or vegetable juice, pasta with vegetables or tomato sauce.  Select clear, rather than cream soups.  Ask that dressings and gravies be served on the side. Then use less of them.  Order foods that are baked, broiled, poached, steamed, stir-fried, or roasted.  Ask for margarine instead of butter, and use only a small amount.  Drink sparkling water, unsweetened tea or coffee, or diet soft drinks instead of alcohol or other sweet beverages. QUESTIONS AND ANSWERS ABOUT OTHER FATS IN THE BLOOD: SATURATED FAT, TRANS FAT, AND CHOLESTEROL What is trans fat? Trans fat is a type of fat that is formed when vegetable oil is hardened through a process called hydrogenation. This process helps makes foods more solid, gives them shape, and prolongs their shelf life. Trans fats are also called hydrogenated or partially hydrogenated oils.  What do saturated fat, trans fat, and cholesterol in foods have to do with heart disease? Saturated fat, trans fat, and cholesterol in the diet all raise the level of LDL "bad" cholesterol in the blood. The higher the LDL cholesterol, the greater the risk for coronary heart disease (CHD). Saturated fat and trans fat raise LDL similarly.  What foods contain saturated fat, trans fat, and cholesterol? High amounts of saturated fat are found in animal products, such as fatty cuts of meat, chicken skin, and full-fat dairy products like butter, whole milk, cream, and cheese, and in tropical vegetable oils such as palm, palm kernel, and coconut oil. Trans fat is found in some of the same foods as saturated fat, such as vegetable shortening, some margarines (especially hard or  stick margarine), crackers, cookies, baked goods, fried foods, salad dressings, and other processed foods made with partially hydrogenated vegetable oils. Small amounts of trans fat also occur naturally in some animal products, such as milk products, beef, and lamb. Foods high in cholesterol include liver, other organ meats, egg yolks, shrimp, and full-fat dairy products. How can I use the new food label to make heart-healthy food choices? Check the Nutrition Facts panel of the food label. Choose foods lower in saturated fat, trans fat, and cholesterol. For saturated fat and cholesterol, you can also use the Percent Daily Value (%DV): 5% DV or less is low, and 20% DV or more is high. (There is no %DV for trans fat.) Use the Nutrition Facts panel to choose  foods low in saturated fat and cholesterol, and if the trans fat is not listed, read the ingredients and limit products that list shortening or hydrogenated or partially hydrogenated vegetable oil, which tend to be high in trans fat. POINTS TO REMEMBER:   Discuss your risk for heart disease with your caregivers, and take steps to reduce risk factors.  Change your diet. Choose foods that are low in saturated fat, trans fat, and cholesterol.  Add exercise to your daily routine if it is not already being done. Participate in physical activity of moderate intensity, like brisk walking, for at least 30 minutes on most, and preferably all days of the week. No time? Break the 30 minutes into three, 10-minute segments during the day.  Stop smoking. If you do smoke, contact your caregiver to discuss ways in which they can help you quit.  Do not use street drugs.  Maintain a normal weight.  Maintain a healthy blood pressure.  Keep up with your blood work for checking the fats in your blood as directed by your caregiver. Document Released: 04/05/2004 Document Revised: 12/18/2011 Document Reviewed: 11/01/2008 [facility] Patient Information 2013 [place],  [place].

## 2012-11-05 NOTE — Progress Notes (Signed)
  Subjective:    Patient ID: Meredith Anderson, female    DOB: [birth year], 72 y.o.   MRN: [id number]  HPI Patient presents today for follow up of chronic health conditions. Hypertension, atrial fibrillation, hyperlipidemia, diabetes, hypothyridism. Aslo complains of sinus pressure over eyes and cheek area. Reports taking prescribed medications as directed. Blood sugar checked three times a week and diary kept, but reports forgetting to bring it with her. Reports blood sugar ranges from 80-to occasional 147. Blood pressure check a couple times week, unsure of range. Denies headaches.  Reports feeling tired at times, but taking synthroid as directed.     Review of Systems  Constitutional: Negative for fever and chills.  HENT: Positive for congestion, sneezing, postnasal drip and sinus pressure. Negative for neck pain.   Respiratory: Negative for chest tightness.   Cardiovascular: Negative for chest pain and palpitations.  Gastrointestinal: Negative for abdominal pain and abdominal distention.  Genitourinary: Negative for difficulty urinating.  Musculoskeletal: Negative for myalgias and back pain.  Skin: Negative.   Neurological: Positive for dizziness. Negative for headaches.       Objective:   Physical Exam  Constitutional: She is oriented to person, place, and time. She appears well-developed and well-nourished.  HENT:  Head: Normocephalic and atraumatic.  Right Ear: External ear normal.  Left Ear: External ear normal.  Nose: Right sinus exhibits maxillary sinus tenderness and frontal sinus tenderness. Left sinus exhibits maxillary sinus tenderness and frontal sinus tenderness.  Cardiovascular: Normal rate, regular rhythm and normal heart sounds.   No murmur heard. Pulmonary/Chest: Effort normal and breath sounds normal.  Abdominal: Soft. Bowel sounds are normal. She exhibits no distension and no mass. There is no tenderness. There is no rebound and no guarding.  Neurological: She is alert  and oriented to person, place, and time.  Skin: Skin is warm and dry.  Psychiatric: She has a normal mood and affect.          Assessment & Plan:  Take antibiotic as directed and complete course even if feeling better Increase fluid intake Continue working on eating healthy (baked food, low fat, low carb) Increase physical activity as tolerated RTO if symptoms develop, worsen, or unresolved Patient verbalized understanding Coralie Keens, FNP-C

## 2012-11-08 ENCOUNTER — Other Ambulatory Visit: Payer: Self-pay | Admitting: Nurse Practitioner

## 2012-11-10 NOTE — Progress Notes (Signed)
Please inform that labs are ok. Please continue to work on healthy low fat diet and regular exercise. thx

## 2012-11-13 ENCOUNTER — Telehealth: Payer: Self-pay | Admitting: *Deleted

## 2012-11-13 NOTE — Telephone Encounter (Signed)
Number on file is incorrect

## 2012-11-13 NOTE — Telephone Encounter (Signed)
Message copied by Magdalene River on Thu Nov 13, 2012  8:49 AM ------      Message from: Carl Best, [place] E      Created: Wed Nov 12, 2012  7:19 PM       Labs ok. Continue diet and exercise. thx ------

## 2012-11-17 ENCOUNTER — Encounter: Payer: Self-pay | Admitting: *Deleted

## 2012-11-25 ENCOUNTER — Telehealth: Payer: Self-pay | Admitting: Nurse Practitioner

## 2012-11-25 NOTE — Telephone Encounter (Signed)
protime appt made 

## 2012-11-27 ENCOUNTER — Other Ambulatory Visit: Payer: Medicare Other

## 2012-11-27 ENCOUNTER — Ambulatory Visit (INDEPENDENT_AMBULATORY_CARE_PROVIDER_SITE_OTHER): Payer: Medicare Other | Admitting: Pharmacist

## 2012-11-27 DIAGNOSIS — I4891 Unspecified atrial fibrillation: Secondary | ICD-10-CM

## 2012-11-27 DIAGNOSIS — Z7901 Long term (current) use of anticoagulants: Secondary | ICD-10-CM

## 2012-11-27 NOTE — Progress Notes (Signed)
Patient came in for labs only.

## 2012-11-27 NOTE — Patient Instructions (Signed)
Anticoagulation Dose Instructions as of 11/27/2012     Glynis Smiles Tue Wed Thu Fri Sat   New Dose 3 mg 2 mg 3 mg 3 mg 3 mg 2 mg 3 mg    Description       Changes to 3mg  daily except 2mg  on mondays and fridays      INR was 2.6 today

## 2012-12-02 ENCOUNTER — Encounter: Payer: Self-pay | Admitting: General Practice

## 2012-12-02 ENCOUNTER — Ambulatory Visit: Payer: Medicare Other | Admitting: General Practice

## 2012-12-02 ENCOUNTER — Ambulatory Visit: Payer: Medicare Other | Admitting: Nurse Practitioner

## 2012-12-02 ENCOUNTER — Ambulatory Visit (INDEPENDENT_AMBULATORY_CARE_PROVIDER_SITE_OTHER): Payer: Medicare Other | Admitting: General Practice

## 2012-12-02 VITALS — BP 138/75 | HR 60 | Temp 98.0°F | Ht 64.0 in | Wt 152.5 lb

## 2012-12-02 DIAGNOSIS — I10 Essential (primary) hypertension: Secondary | ICD-10-CM

## 2012-12-02 MED ORDER — LISINOPRIL-HYDROCHLOROTHIAZIDE 20-12.5 MG PO TABS: 1.0000 | ORAL_TABLET | ORAL | Status: DC

## 2012-12-02 NOTE — Progress Notes (Signed)
  Subjective:    Patient ID: Meredith Anderson, female    DOB: 10-22-1940, 72 y.o.   MRN: [id number]  HPI Presents today for hospital follow up. Reports she admitted on Sunday May 28th and discharged on Tuesday. Reports she admitted for afib. Denies any similar episodes. Reports she needs a refill on blood pressure medication.     Review of Systems  Constitutional: Negative for fever and chills.  Respiratory: Negative for chest tightness and shortness of breath.   Cardiovascular: Negative for chest pain and palpitations.  Skin: Negative.   Neurological: Negative for dizziness, weakness, numbness and headaches.  Psychiatric/Behavioral: Negative.        Objective:   Physical Exam  Constitutional: She is oriented to person, place, and time. She appears well-developed and well-nourished.  Cardiovascular: Normal rate, regular rhythm and normal heart sounds.   Pulmonary/Chest: Effort normal and breath sounds normal. No respiratory distress. She exhibits no tenderness.  Neurological: She is alert and oriented to person, place, and time.  Skin: Skin is warm and dry.  Psychiatric: She has a normal mood and affect.          Assessment & Plan:  Hospital follow up -continue current medications as prescribed -keep appointment with Cardiologist -discussed healthy eating habits -discussed physical activity as tolerated -call 911 if symptoms develop (including but not limited to shortness of breath, palpitations, chest pain) -Patient verbalized understanding -Coralie Keens, FNP-C

## 2012-12-04 ENCOUNTER — Encounter: Payer: Self-pay | Admitting: *Deleted

## 2012-12-11 ENCOUNTER — Telehealth: Payer: Self-pay | Admitting: Pharmacist

## 2012-12-11 ENCOUNTER — Ambulatory Visit (INDEPENDENT_AMBULATORY_CARE_PROVIDER_SITE_OTHER): Payer: Medicare Other | Admitting: Pharmacist

## 2012-12-11 DIAGNOSIS — Z7901 Long term (current) use of anticoagulants: Secondary | ICD-10-CM

## 2012-12-11 DIAGNOSIS — I4891 Unspecified atrial fibrillation: Secondary | ICD-10-CM

## 2012-12-11 LAB — POCT INR: INR: 4.3

## 2012-12-11 MED ORDER — WARFARIN SODIUM 1 MG PO TABS: ORAL_TABLET | ORAL | Status: DC

## 2012-12-11 NOTE — Telephone Encounter (Signed)
error 

## 2012-12-11 NOTE — Patient Instructions (Addendum)
Anticoagulation Dose Instructions as of 12/11/2012     Meredith Anderson Tue Wed Thu Fri Sat   New Dose 3 mg 2 mg 3 mg 2 mg 3 mg 2 mg 3 mg    Description       Hold warfarin for 2 days, then start warfarin 1mg  tablets -  Take 2 tablets (=2mg ) on mondays, wednesdays and fridays Take 3 tablets (=3mg ) on tuesdays, thursdays, saturdays and sundays.         INR was 4.3 today.

## 2012-12-16 ENCOUNTER — Ambulatory Visit (INDEPENDENT_AMBULATORY_CARE_PROVIDER_SITE_OTHER): Payer: Medicare Other | Admitting: Pharmacist

## 2012-12-16 DIAGNOSIS — I4891 Unspecified atrial fibrillation: Secondary | ICD-10-CM

## 2012-12-16 DIAGNOSIS — Z7901 Long term (current) use of anticoagulants: Secondary | ICD-10-CM

## 2012-12-16 LAB — POCT INR: INR: 2.3

## 2012-12-16 NOTE — Patient Instructions (Signed)
Anticoagulation Dose Instructions as of 12/16/2012     Meredith Anderson Tue Wed Thu Fri Sat   New Dose 2.5 mg 2.5 mg 2.5 mg 2.5 mg 2.5 mg 2.5 mg 2.5 mg    Description       Patient requested change back to 2.5mg  tablets.  New dose is 2.5mg  take 1 tablet daily       INR was 2.3 today

## 2013-01-05 ENCOUNTER — Ambulatory Visit (INDEPENDENT_AMBULATORY_CARE_PROVIDER_SITE_OTHER): Payer: Medicare Other | Admitting: Pharmacist

## 2013-01-05 DIAGNOSIS — I4891 Unspecified atrial fibrillation: Secondary | ICD-10-CM

## 2013-01-05 DIAGNOSIS — Z7901 Long term (current) use of anticoagulants: Secondary | ICD-10-CM

## 2013-01-05 LAB — POCT INR: INR: 2.9

## 2013-01-05 NOTE — Patient Instructions (Signed)
Anticoagulation Dose Instructions as of 01/05/2013     Glynis Smiles Tue Wed Thu Fri Sat   New Dose 2.5 mg 2.5 mg 2.5 mg 2.5 mg 2.5 mg 2.5 mg 2.5 mg    Description       Continue to take 1 tablet daily [= 2.5mg  daily]         INR was 2.9 today

## 2013-01-29 ENCOUNTER — Encounter: Payer: Self-pay | Admitting: *Deleted

## 2013-02-06 ENCOUNTER — Ambulatory Visit: Payer: Medicare Other | Admitting: General Practice

## 2013-02-09 ENCOUNTER — Encounter: Payer: Self-pay | Admitting: General Practice

## 2013-02-09 ENCOUNTER — Ambulatory Visit (INDEPENDENT_AMBULATORY_CARE_PROVIDER_SITE_OTHER): Payer: Medicare Other | Admitting: General Practice

## 2013-02-09 VITALS — BP 154/77 | HR 58 | Temp 99.3°F | Ht 64.0 in | Wt 153.0 lb

## 2013-02-09 DIAGNOSIS — E039 Hypothyroidism, unspecified: Secondary | ICD-10-CM

## 2013-02-09 DIAGNOSIS — I10 Essential (primary) hypertension: Secondary | ICD-10-CM

## 2013-02-09 DIAGNOSIS — E785 Hyperlipidemia, unspecified: Secondary | ICD-10-CM

## 2013-02-09 DIAGNOSIS — E119 Type 2 diabetes mellitus without complications: Secondary | ICD-10-CM

## 2013-02-09 DIAGNOSIS — Z7901 Long term (current) use of anticoagulants: Secondary | ICD-10-CM

## 2013-02-09 DIAGNOSIS — I4891 Unspecified atrial fibrillation: Secondary | ICD-10-CM

## 2013-02-09 MED ORDER — LEVOTHYROXINE SODIUM 100 MCG PO TABS: 100.0000 ug | ORAL_TABLET | Freq: Every day | ORAL | Status: DC

## 2013-02-09 MED ORDER — GLIPIZIDE ER 5 MG PO TB24: 5.0000 mg | ORAL_TABLET | ORAL | Status: DC

## 2013-02-09 MED ORDER — ROSUVASTATIN CALCIUM 20 MG PO TABS: 20.0000 mg | ORAL_TABLET | Freq: Every day | ORAL | Status: DC

## 2013-02-09 MED ORDER — LISINOPRIL-HYDROCHLOROTHIAZIDE 20-12.5 MG PO TABS: 1.0000 | ORAL_TABLET | ORAL | Status: DC

## 2013-02-09 MED ORDER — SITAGLIPTIN PHOSPHATE 100 MG PO TABS: 100.0000 mg | ORAL_TABLET | Freq: Every day | ORAL | Status: DC

## 2013-02-09 NOTE — Patient Instructions (Addendum)
Hypertriglyceridemia  Diet for High blood levels of Triglycerides Most fats in food are triglycerides. Triglycerides in your blood are stored as fat in your body. High levels of triglycerides in your blood may put you at a greater risk for heart disease and stroke.  Normal triglyceride levels are less than 150 mg/dL. Borderline high levels are 150-199 mg/dl. High levels are 200 - 499 mg/dL, and very high triglyceride levels are greater than 500 mg/dL. The decision to treat high triglycerides is generally based on the level. For people with borderline or high triglyceride levels, treatment includes weight loss and exercise. Drugs are recommended for people with very high triglyceride levels. Many people who need treatment for high triglyceride levels have metabolic syndrome. This syndrome is a collection of disorders that often include: insulin resistance, high blood pressure, blood clotting problems, high cholesterol and triglycerides. TESTING PROCEDURE FOR TRIGLYCERIDES  You should not eat 4 hours before getting your triglycerides measured. The normal range of triglycerides is between 10 and 250 milligrams per deciliter (mg/dl). Some people may have extreme levels (1000 or above), but your triglyceride level may be too high if it is above 150 mg/dl, depending on what other risk factors you have for heart disease.  People with high blood triglycerides may also have high blood cholesterol levels. If you have high blood cholesterol as well as high blood triglycerides, your risk for heart disease is probably greater than if you only had high triglycerides. High blood cholesterol is one of the main risk factors for heart disease. CHANGING YOUR DIET  Your weight can affect your blood triglyceride level. If you are more than 20% above your ideal body weight, you may be able to lower your blood triglycerides by losing weight. Eating less and exercising regularly is the best way to combat this. Fat provides more  calories than any other food. The best way to lose weight is to eat less fat. Only 30% of your total calories should come from fat. Less than 7% of your diet should come from saturated fat. A diet low in fat and saturated fat is the same as a diet to decrease blood cholesterol. By eating a diet lower in fat, you may lose weight, lower your blood cholesterol, and lower your blood triglyceride level.  Eating a diet low in fat, especially saturated fat, may also help you lower your blood triglyceride level. Ask your dietitian to help you figure how much fat you can eat based on the number of calories your caregiver has prescribed for you.  Exercise, in addition to helping with weight loss may also help lower triglyceride levels.   Alcohol can increase blood triglycerides. You may need to stop drinking alcoholic beverages.  Too much carbohydrate in your diet may also increase your blood triglycerides. Some complex carbohydrates are necessary in your diet. These may include bread, rice, potatoes, other starchy vegetables and cereals.  Reduce "simple" carbohydrates. These may include pure sugars, candy, honey, and jelly without losing other nutrients. If you have the kind of high blood triglycerides that is affected by the amount of carbohydrates in your diet, you will need to eat less sugar and less high-sugar foods. Your caregiver can help you with this.  Adding 2-4 grams of fish oil (EPA+ DHA) may also help lower triglycerides. Speak with your caregiver before adding any supplements to your regimen. Following the Diet  Maintain your ideal weight. Your caregivers can help you with a diet. Generally, eating less food and getting more   exercise will help you lose weight. Joining a weight control group may also help. Ask your caregivers for a good weight control group in your area.  Eat low-fat foods instead of high-fat foods. This can help you lose weight too.  These foods are lower in fat. Eat MORE of these:    Dried beans, peas, and lentils.  Egg whites.  Low-fat cottage cheese.  Fish.  Lean cuts of meat, such as round, sirloin, rump, and flank (cut extra fat off meat you fix).  Whole grain breads, cereals and pasta.  Skim and nonfat dry milk.  Low-fat yogurt.  Poultry without the skin.  Cheese made with skim or part-skim milk, such as mozzarella, parmesan, farmers', ricotta, or pot cheese. These are higher fat foods. Eat LESS of these:   Whole milk and foods made from whole milk, such as American, blue, cheddar, monterey jack, and swiss cheese  High-fat meats, such as luncheon meats, sausages, knockwurst, bratwurst, hot dogs, ribs, corned beef, ground pork, and regular ground beef.  Fried foods. Limit saturated fats in your diet. Substituting unsaturated fat for saturated fat may decrease your blood triglyceride level. You will need to read package labels to know which products contain saturated fats.  These foods are high in saturated fat. Eat LESS of these:   Fried pork skins.  Whole milk.  Skin and fat from poultry.  Palm oil.  Butter.  Shortening.  Cream cheese.  Bacon.  Margarines and baked goods made from listed oils.  Vegetable shortenings.  Chitterlings.  Fat from meats.  Coconut oil.  Palm kernel oil.  Lard.  Cream.  Sour cream.  Fatback.  Coffee whiteners and non-dairy creamers made with these oils.  Cheese made from whole milk. Use unsaturated fats (both polyunsaturated and monounsaturated) moderately. Remember, even though unsaturated fats are better than saturated fats; you still want a diet low in total fat.  These foods are high in unsaturated fat:   Canola oil.  Sunflower oil.  Mayonnaise.  Almonds.  Peanuts.  Pine nuts.  Margarines made with these oils.  Safflower oil.  Olive oil.  Avocados.  Cashews.  Peanut butter.  Sunflower seeds.  Soybean oil.  Peanut  oil.  Olives.  Pecans.  Walnuts.  Pumpkin seeds. Avoid sugar and other high-sugar foods. This will decrease carbohydrates without decreasing other nutrients. Sugar in your food goes rapidly to your blood. When there is excess sugar in your blood, your liver may use it to make more triglycerides. Sugar also contains calories without other important nutrients.  Eat LESS of these:   Sugar, brown sugar, powdered sugar, jam, jelly, preserves, honey, syrup, molasses, pies, candy, cakes, cookies, frosting, pastries, colas, soft drinks, punches, fruit drinks, and regular gelatin.  Avoid alcohol. Alcohol, even more than sugar, may increase blood triglycerides. In addition, alcohol is high in calories and low in nutrients. Ask for sparkling water, or a diet soft drink instead of an alcoholic beverage. Suggestions for planning and preparing meals   Bake, broil, grill or roast meats instead of frying.  Remove fat from meats and skin from poultry before cooking.  Add spices, herbs, lemon juice or vinegar to vegetables instead of salt, rich sauces or gravies.  Use a non-stick skillet without fat or use no-stick sprays.  Cool and refrigerate stews and broth. Then remove the hardened fat floating on the surface before serving.  Refrigerate meat drippings and skim off fat to make low-fat gravies.  Serve more fish.  Use less butter,   margarine and other high-fat spreads on bread or vegetables.  Use skim or reconstituted non-fat dry milk for cooking.  Cook with low-fat cheeses.  Substitute low-fat yogurt or cottage cheese for all or part of the sour cream in recipes for sauces, dips or congealed salads.  Use half yogurt/half mayonnaise in salad recipes.  Substitute evaporated skim milk for cream. Evaporated skim milk or reconstituted non-fat dry milk can be whipped and substituted for whipped cream in certain recipes.  Choose fresh fruits for dessert instead of high-fat foods such as pies or  cakes. Fruits are naturally low in fat. When Dining Out   Order low-fat appetizers such as fruit or vegetable juice, pasta with vegetables or tomato sauce.  Select clear, rather than cream soups.  Ask that dressings and gravies be served on the side. Then use less of them.  Order foods that are baked, broiled, poached, steamed, stir-fried, or roasted.  Ask for margarine instead of butter, and use only a small amount.  Drink sparkling water, unsweetened tea or coffee, or diet soft drinks instead of alcohol or other sweet beverages. QUESTIONS AND ANSWERS ABOUT OTHER FATS IN THE BLOOD: SATURATED FAT, TRANS FAT, AND CHOLESTEROL What is trans fat? Trans fat is a type of fat that is formed when vegetable oil is hardened through a process called hydrogenation. This process helps makes foods more solid, gives them shape, and prolongs their shelf life. Trans fats are also called hydrogenated or partially hydrogenated oils.  What do saturated fat, trans fat, and cholesterol in foods have to do with heart disease? Saturated fat, trans fat, and cholesterol in the diet all raise the level of LDL "bad" cholesterol in the blood. The higher the LDL cholesterol, the greater the risk for coronary heart disease (CHD). Saturated fat and trans fat raise LDL similarly.  What foods contain saturated fat, trans fat, and cholesterol? High amounts of saturated fat are found in animal products, such as fatty cuts of meat, chicken skin, and full-fat dairy products like butter, whole milk, cream, and cheese, and in tropical vegetable oils such as palm, palm kernel, and coconut oil. Trans fat is found in some of the same foods as saturated fat, such as vegetable shortening, some margarines (especially hard or stick margarine), crackers, cookies, baked goods, fried foods, salad dressings, and other processed foods made with partially hydrogenated vegetable oils. Small amounts of trans fat also occur naturally in some animal  products, such as milk products, beef, and lamb. Foods high in cholesterol include liver, other organ meats, egg yolks, shrimp, and full-fat dairy products. How can I use the new food label to make heart-healthy food choices? Check the Nutrition Facts panel of the food label. Choose foods lower in saturated fat, trans fat, and cholesterol. For saturated fat and cholesterol, you can also use the Percent Daily Value (%DV): 5% DV or less is low, and 20% DV or more is high. (There is no %DV for trans fat.) Use the Nutrition Facts panel to choose foods low in saturated fat and cholesterol, and if the trans fat is not listed, read the ingredients and limit products that list shortening or hydrogenated or partially hydrogenated vegetable oil, which tend to be high in trans fat. POINTS TO REMEMBER:   Discuss your risk for heart disease with your caregivers, and take steps to reduce risk factors.  Change your diet. Choose foods that are low in saturated fat, trans fat, and cholesterol.  Add exercise to your daily routine if   it is not already being done. Participate in physical activity of moderate intensity, like brisk walking, for at least 30 minutes on most, and preferably all days of the week. No time? Break the 30 minutes into three, 10-minute segments during the day.  Stop smoking. If you do smoke, contact your caregiver to discuss ways in which they can help you quit.  Do not use street drugs.  Maintain a normal weight.  Maintain a healthy blood pressure.  Keep up with your blood work for checking the fats in your blood as directed by your caregiver. Document Released: 04/05/2004 Document Revised: 12/18/2011 Document Reviewed: 11/01/2008 [employer] Patient Information 2014 [employer].  

## 2013-02-09 NOTE — Progress Notes (Signed)
  Subjective:    Patient ID: Meredith Anderson, female    DOB: 01/22/1941, 72 y.o.   MRN: [id number]  HPI Patient presents today for 3 month follow up of chronic health conditions. She has a history of diabetes, hypertension, afib, hyperlipidemia, and hypothyroidism. She reports taking medications as directed. She reports checking daily and ranges 110's. She reports having numbness periodically to bilateral lower extremities, not simitanously and would like to try something other than lipitor (to see if this is cause). Reports checking blood pressure 1-2 times daily (sitting and standing) and ranges 80's-140/50's-80's. She reports feeling dizzy upon standing and blood pressure is normally in the 80 to 90's systolic.     Review of Systems  Constitutional: Negative for fever and chills.  HENT: Negative for neck pain and neck stiffness.   Respiratory: Negative for chest tightness and shortness of breath.   Cardiovascular: Negative for chest pain and palpitations.  Gastrointestinal: Negative for vomiting, abdominal pain, diarrhea and blood in stool.  Genitourinary: Negative for dysuria and difficulty urinating.  Neurological: Negative for dizziness, weakness and headaches.       Objective:   Physical Exam  Constitutional: She is oriented to person, place, and time. She appears well-developed and well-nourished.  HENT:  Head: Normocephalic and atraumatic.  Right Ear: External ear normal.  Left Ear: External ear normal.  Nose: Nose normal.  Mouth/Throat: Oropharynx is clear and moist.  Eyes: EOM are normal. Pupils are equal, round, and reactive to light.  Neck: Normal range of motion. Neck supple. No thyromegaly present.  Cardiovascular: Normal rate, regular rhythm and normal heart sounds.   Pulmonary/Chest: Effort normal and breath sounds normal. No respiratory distress. She exhibits no tenderness.  Abdominal: Soft. Bowel sounds are normal. She exhibits no distension. There is no tenderness.   Musculoskeletal: She exhibits no edema and no tenderness.  Lymphadenopathy:    She has no cervical adenopathy.  Neurological: She is alert and oriented to person, place, and time.  Skin: Skin is warm and dry.  Psychiatric: She has a normal mood and affect.          Assessment & Plan:  1. Unspecified hypothyroidism - Thyroid Panel With TSH - levothyroxine (SYNTHROID, LEVOTHROID) 100 MCG tablet; Take 1 tablet (100 mcg total) by mouth daily before breakfast.  Dispense: 30 tablet; Refill: 3  2. Other and unspecified hyperlipidemia - NMR, lipoprofile - rosuvastatin (CRESTOR) 20 MG tablet; Take 1 tablet (20 mg total) by mouth daily.  Dispense: 30 tablet; Refill: 3 -stop taking lipitor for now  3. Diabetes - POCT glycosylated hemoglobin (Hb A1C) - glipiZIDE (GLUCOTROL XL) 5 MG 24 hr tablet; Take 1 tablet (5 mg total) by mouth as directed. Take one tablet by mouth every other day  Dispense: 30 tablet; Refill: 3 - sitaGLIPtin (JANUVIA) 100 MG tablet; Take 1 tablet (100 mg total) by mouth daily. Take every other day  Dispense: 30 tablet; Refill: 3  4. Hypertension - POCT CBC - CMP14+EGFR  5. Essential hypertension, benign - lisinopril-hydrochlorothiazide (PRINZIDE,ZESTORETIC) 20-12.5 MG per tablet; Take 1 tablet by mouth as directed.  Dispense: 30 tablet; Refill: 3  6. Atrial fibrillation - POCT INR  7. Long term (current) use of anticoagulants - POCT INR -Continue all current medications Labs pending F/u in 3 months and prn Discussed regular exercise and healthy eating Patient verbalized understanding Coralie Keens, FNP-C

## 2013-02-10 ENCOUNTER — Other Ambulatory Visit: Payer: Medicare Other

## 2013-02-10 LAB — POCT CBC
Hemoglobin: 12.8 g/dL (ref 12.2–16.2)
MCHC: 33.3 g/dL (ref 31.8–35.4)
MPV: 9.4 fL (ref 0–99.8)
POC Granulocyte: 3.6 (ref 2–6.9)
POC LYMPH PERCENT: 22 %L (ref 10–50)
RDW, POC: 14.3 %
WBC: 5.2 10*3/uL (ref 4.6–10.2)

## 2013-02-10 LAB — POCT GLYCOSYLATED HEMOGLOBIN (HGB A1C): Hemoglobin A1C: 6.3

## 2013-02-11 LAB — THYROID PANEL WITH TSH
Free Thyroxine Index: 2.9 (ref 1.2–4.9)
T3 Uptake Ratio: 31 % (ref 24–39)
TSH: 7.67 u[IU]/mL — ABNORMAL HIGH (ref 0.450–4.500)

## 2013-02-11 LAB — CMP14+EGFR
ALT: 46 IU/L — ABNORMAL HIGH (ref 0–32)
AST: 43 IU/L — ABNORMAL HIGH (ref 0–40)
CO2: 25 mmol/L (ref 18–29)
Calcium: 9.7 mg/dL (ref 8.6–10.2)
Potassium: 4.7 mmol/L (ref 3.5–5.2)
Sodium: 139 mmol/L (ref 134–144)

## 2013-02-12 LAB — NMR, LIPOPROFILE
HDL Cholesterol by NMR: 39 mg/dL — ABNORMAL LOW (ref >=40)
LDLC SERPL CALC-MCNC: 81 mg/dL (ref <100)
Small LDL Particle Number: 119 nmol/L (ref <=527)

## 2013-02-16 ENCOUNTER — Other Ambulatory Visit: Payer: Self-pay | Admitting: General Practice

## 2013-02-16 DIAGNOSIS — E039 Hypothyroidism, unspecified: Secondary | ICD-10-CM

## 2013-02-16 MED ORDER — LEVOTHYROXINE SODIUM 112 MCG PO TABS: 112.0000 ug | ORAL_TABLET | Freq: Every day | ORAL | Status: DC

## 2013-02-17 ENCOUNTER — Encounter: Payer: Self-pay | Admitting: *Deleted

## 2013-02-17 ENCOUNTER — Telehealth: Payer: Self-pay | Admitting: *Deleted

## 2013-02-17 NOTE — Telephone Encounter (Signed)
Pt aware.

## 2013-02-20 ENCOUNTER — Encounter: Payer: Self-pay | Admitting: General Practice

## 2013-02-20 ENCOUNTER — Ambulatory Visit (INDEPENDENT_AMBULATORY_CARE_PROVIDER_SITE_OTHER): Payer: Medicare Other | Admitting: General Practice

## 2013-02-20 VITALS — BP 178/91 | HR 66 | Temp 98.0°F | Ht 64.0 in | Wt 150.0 lb

## 2013-02-20 DIAGNOSIS — I10 Essential (primary) hypertension: Secondary | ICD-10-CM

## 2013-02-20 NOTE — Progress Notes (Signed)
  Subjective:    Patient ID: Meredith Anderson, female    DOB: 02-Jul-1941, 72 y.o.   MRN: [id number]  HPI Patient presents today for follow up after hospital visit. She reports being seen in the emergency room on 02/16/13 and 02/17/13 for elevated blood pressure. Reports home readings were 188/112 and 180/101, prior to hospital visits. She reports checking her blood pressure 150's-180's/80's. She reports taking blood pressure medication as prescribed.     Review of Systems  Constitutional: Negative for fever and chills.  Respiratory: Negative for chest tightness and shortness of breath.   Cardiovascular: Negative for chest pain and palpitations.  Neurological: Negative for dizziness, weakness and headaches.       Objective:   Physical Exam  Constitutional: She is oriented to person, place, and time. She appears well-developed and well-nourished.  Cardiovascular: Normal rate, regular rhythm and normal heart sounds.   Pulmonary/Chest: Effort normal and breath sounds normal. No respiratory distress. She exhibits no tenderness.  Neurological: She is alert and oriented to person, place, and time.  Skin: Skin is warm and dry.  Psychiatric: She has a normal mood and affect.          Assessment & Plan:  1. Hypertension Continue current blood pressure medication Start benicar 20mg  daily (samples given, 21 tablets, lot # [id number], exp 08/16 Monitor blood pressure and maintain diary (bring to next appointment) Follow up in 2 weeks for blood pressure RTO if symptoms worsen Patient verbalized understanding Coralie Keens, FNP-C

## 2013-02-20 NOTE — Patient Instructions (Addendum)

## 2013-03-10 ENCOUNTER — Ambulatory Visit (INDEPENDENT_AMBULATORY_CARE_PROVIDER_SITE_OTHER): Payer: Medicare Other

## 2013-03-10 ENCOUNTER — Telehealth: Payer: Self-pay | Admitting: Pharmacist Clinician (PhC)/ Clinical Pharmacy Specialist

## 2013-03-10 VITALS — BP 160/72

## 2013-03-10 DIAGNOSIS — I4891 Unspecified atrial fibrillation: Secondary | ICD-10-CM

## 2013-03-10 DIAGNOSIS — Z7901 Long term (current) use of anticoagulants: Secondary | ICD-10-CM

## 2013-03-10 NOTE — Telephone Encounter (Signed)
Stopped Benicar 20mg , patient already on ACEI and started amlodipine 2.5mg  qd (1/2 tablet of 5mg ).  BP checked twice today and both times was 160-175/70-80.  Patient will check BP daily at home has monitor.

## 2013-03-16 ENCOUNTER — Telehealth: Payer: Self-pay | Admitting: General Practice

## 2013-03-16 NOTE — Telephone Encounter (Signed)
Patient has scheduled an appt for tomorrow with Marcelino Duster. Should she take Amlodipine 2.5 this evening to help lower her blood pressure?

## 2013-03-16 NOTE — Telephone Encounter (Signed)
Yes. Take a whole tablet daily.

## 2013-03-16 NOTE — Telephone Encounter (Signed)
Left message on pt voice mail regarding message below and advised to call in am if further questions

## 2013-03-17 ENCOUNTER — Telehealth: Payer: Self-pay | Admitting: Nurse Practitioner

## 2013-03-20 ENCOUNTER — Encounter: Payer: Self-pay | Admitting: Family Medicine

## 2013-03-20 ENCOUNTER — Ambulatory Visit (INDEPENDENT_AMBULATORY_CARE_PROVIDER_SITE_OTHER): Payer: Medicare Other | Admitting: Family Medicine

## 2013-03-20 VITALS — BP 177/81 | HR 69 | Temp 97.1°F | Ht 64.0 in | Wt 151.8 lb

## 2013-03-20 DIAGNOSIS — I10 Essential (primary) hypertension: Secondary | ICD-10-CM

## 2013-03-20 MED ORDER — LISINOPRIL-HYDROCHLOROTHIAZIDE 20-25 MG PO TABS: 1.0000 | ORAL_TABLET | Freq: Every day | ORAL | Status: DC

## 2013-03-20 NOTE — Patient Instructions (Addendum)

## 2013-03-20 NOTE — Progress Notes (Signed)
  Subjective:    Patient ID: Meredith Anderson, female    DOB: 05/10/1941, 72 y.o.   MRN: [id number]  HPI  This 72 y.o. female presents for evaluation of hypertension.  She was told to increase her Amlodipine 5mg  one half po qd to one po qd and she has been doing this for the last few days. She is still having elevated blood pressure readings. Review of Systems No chest pain, SOB, HA, dizziness, vision change, N/V, diarrhea, constipation, dysuria, urinary urgency or frequency, myalgias, arthralgias or rash.     Objective:   Physical Exam  Vital signs noted  Well developed well nourished female.  HEENT - Head atraumatic Normocephalic                Eyes - PERRLA, Conjuctiva - clear Sclera- Clear EOMI                Ears - EAC's Wnl TM's Wnl Gross Hearing WNL                Nose - Nares patent                 Throat - oropharanx wnl Respiratory - Lungs CTA bilateral Cardiac - RRR S1 and S2 without murmur GI - Abdomen soft Nontender and bowel sounds active x 4 Extremities - No edema. Neuro - Grossly intact.      Assessment & Plan:  Unspecified essential hypertension Lisinopril HCT 20/25 one po qd #30w/11 rf Follow up in 2 weeks.

## 2013-03-31 NOTE — Telephone Encounter (Signed)
error 

## 2013-04-03 ENCOUNTER — Encounter: Payer: Self-pay | Admitting: Family Medicine

## 2013-04-03 ENCOUNTER — Ambulatory Visit (INDEPENDENT_AMBULATORY_CARE_PROVIDER_SITE_OTHER): Payer: Medicare Other | Admitting: Family Medicine

## 2013-04-03 VITALS — BP 135/71 | HR 63 | Temp 97.9°F | Ht 64.0 in | Wt 152.0 lb

## 2013-04-03 DIAGNOSIS — I10 Essential (primary) hypertension: Secondary | ICD-10-CM

## 2013-04-03 DIAGNOSIS — Z01818 Encounter for other preprocedural examination: Secondary | ICD-10-CM

## 2013-04-03 DIAGNOSIS — Z23 Encounter for immunization: Secondary | ICD-10-CM

## 2013-04-03 DIAGNOSIS — E1159 Type 2 diabetes mellitus with other circulatory complications: Secondary | ICD-10-CM | POA: Insufficient documentation

## 2013-04-03 DIAGNOSIS — G4700 Insomnia, unspecified: Secondary | ICD-10-CM

## 2013-04-03 LAB — POCT CBC
Granulocyte percent: 72 %G (ref 37–80)
HCT, POC: 39.5 % (ref 37.7–47.9)
Hemoglobin: 13.6 g/dL (ref 12.2–16.2)
Lymph, poc: 1.2 (ref 0.6–3.4)
MCH, POC: 31.4 pg — AB (ref 27–31.2)
MCHC: 34.4 g/dL (ref 31.8–35.4)
MCV: 91.2 fL (ref 80–97)
MPV: 8.8 fL (ref 0–99.8)
POC Granulocyte: 4.2 (ref 2–6.9)
POC LYMPH PERCENT: 21 %L (ref 10–50)
Platelet Count, POC: 234 10*3/uL (ref 142–424)
RBC: 4.3 M/uL (ref 4.04–5.48)
RDW, POC: 13.9 %
WBC: 5.8 10*3/uL (ref 4.6–10.2)

## 2013-04-03 MED ORDER — CLONIDINE HCL 0.1 MG PO TABS: ORAL_TABLET | ORAL | Status: DC

## 2013-04-03 NOTE — Progress Notes (Signed)
  Subjective:    Patient ID: Meredith Anderson, female    DOB: 11-21-1940, 72 y.o.   MRN: [id number]  HPI This 72 y.o. female presents for evaluation of follow up on her blood pressure.  She was seen about 2 weeks Ago and she was put on lisinopril hct 20/25 one po qd along with amlodipine 5mg  po qd.  She states she Has been having some heart pounding and feels her her heart pound at night.  She c/o ringing in her ears At night when she lays down.  She c/o insomnia.  She is seeing cardiology for atrial fibrillation and she states She called her cardiologist and was told to f/u with pcp for bp management.  She states when she has these Episodes her bp is 150/90 and she feels flush.  She is seeing OBGYN for uterine prolapse and she states she Needs a surgical clearance for OBGYN to do a hysterectomy.   Review of Systems C/o uterine prolapse, hypertension, insomnia, and tinnitis No chest pain, SOB, HA, dizziness, vision change, N/V, diarrhea, constipation, dysuria, urinary urgency or frequency, myalgias, arthralgias or rash.     Objective:   Physical Exam Vital signs noted  Well developed well nourished anxious female.  HEENT - Head atraumatic Normocephalic                Eyes - PERRLA, Conjuctiva - clear Sclera- Clear EOMI                Ears - EAC's Wnl TM's Wnl Gross Hearing WNL                Nose - Nares patent                 Throat - oropharanx wnl Respiratory - Lungs CTA bilateral Cardiac - RRR S1 and S2 without murmur GI - Abdomen soft Nontender and bowel sounds active x 4 Extremities - No edema. Neuro - Grossly intact.       Assessment & Plan:  Unspecified essential hypertension - Plan: cloNIDine (CATAPRES) 0.1 MG tablet po qhs. B/p is better controlled but she has some breakthrough hypertension in the evenings and some Of this could be anxiety related.  Follow up in 3 months.  Insomnia - Plan: cloNIDine (CATAPRES) 0.1 MG tablet po qhs  Pre-operative clearance - Plan:  Ambulatory referral to Cardiology, POCT CBC, Thyroid Panel With TSH, CMP14+EGFR She is clear pending cardiac clearance.  Hypothyroidism - Check TSH.  Deatra Canter FNP

## 2013-04-03 NOTE — Patient Instructions (Signed)

## 2013-04-04 LAB — CMP14+EGFR
ALT: 42 IU/L — ABNORMAL HIGH (ref 0–32)
AST: 43 IU/L — ABNORMAL HIGH (ref 0–40)
Albumin/Globulin Ratio: 1.6 (ref 1.1–2.5)
Albumin: 5 g/dL — ABNORMAL HIGH (ref 3.5–4.8)
Alkaline Phosphatase: 106 IU/L (ref 39–117)
BUN/Creatinine Ratio: 15 (ref 11–26)
BUN: 18 mg/dL (ref 8–27)
CO2: 27 mmol/L (ref 18–29)
Calcium: 10.5 mg/dL — ABNORMAL HIGH (ref 8.6–10.2)
Chloride: 91 mmol/L — ABNORMAL LOW (ref 97–108)
Creatinine, Ser: 1.2 mg/dL — ABNORMAL HIGH (ref 0.57–1.00)
GFR calc Af Amer: 53 mL/min/{1.73_m2} — ABNORMAL LOW (ref >59)
GFR calc non Af Amer: 46 mL/min/{1.73_m2} — ABNORMAL LOW (ref >59)
Globulin, Total: 3.1 g/dL (ref 1.5–4.5)
Glucose: 111 mg/dL — ABNORMAL HIGH (ref 65–99)
Potassium: 4.4 mmol/L (ref 3.5–5.2)
Sodium: 134 mmol/L (ref 134–144)
Total Bilirubin: 0.4 mg/dL (ref 0.0–1.2)
Total Protein: 8.1 g/dL (ref 6.0–8.5)

## 2013-04-04 LAB — THYROID PANEL WITH TSH
Free Thyroxine Index: 3.1 (ref 1.2–4.9)
T3 Uptake Ratio: 29 % (ref 24–39)
T4, Total: 10.8 ug/dL (ref 4.5–12.0)
TSH: 6.01 u[IU]/mL — ABNORMAL HIGH (ref 0.450–4.500)

## 2013-04-04 LAB — SPECIMEN STATUS REPORT

## 2013-04-06 ENCOUNTER — Other Ambulatory Visit: Payer: Self-pay | Admitting: Family Medicine

## 2013-04-06 DIAGNOSIS — E039 Hypothyroidism, unspecified: Secondary | ICD-10-CM

## 2013-04-06 MED ORDER — LEVOTHYROXINE SODIUM 125 MCG PO TABS: 125.0000 ug | ORAL_TABLET | Freq: Every day | ORAL | Status: DC

## 2013-04-08 ENCOUNTER — Telehealth: Payer: Self-pay | Admitting: Family Medicine

## 2013-04-10 ENCOUNTER — Other Ambulatory Visit: Payer: Self-pay | Admitting: *Deleted

## 2013-04-10 NOTE — Telephone Encounter (Signed)
Received rx request for amlodipine 5mg . According to last office note it looks like clonidine was order and amlodipine was D/Cd. Please advise

## 2013-04-13 ENCOUNTER — Other Ambulatory Visit: Payer: Self-pay

## 2013-04-13 MED ORDER — AMLODIPINE BESYLATE 5 MG PO TABS: 5.0000 mg | ORAL_TABLET | Freq: Every day | ORAL | Status: DC

## 2013-04-14 ENCOUNTER — Other Ambulatory Visit: Payer: Self-pay | Admitting: Family Medicine

## 2013-04-20 ENCOUNTER — Encounter (INDEPENDENT_AMBULATORY_CARE_PROVIDER_SITE_OTHER): Payer: Self-pay

## 2013-04-20 ENCOUNTER — Ambulatory Visit (INDEPENDENT_AMBULATORY_CARE_PROVIDER_SITE_OTHER): Payer: Medicare Other | Admitting: Pharmacist

## 2013-04-20 VITALS — BP 146/68 | HR 68

## 2013-04-20 DIAGNOSIS — I10 Essential (primary) hypertension: Secondary | ICD-10-CM

## 2013-04-20 DIAGNOSIS — Z7901 Long term (current) use of anticoagulants: Secondary | ICD-10-CM

## 2013-04-20 DIAGNOSIS — I4891 Unspecified atrial fibrillation: Secondary | ICD-10-CM

## 2013-04-20 LAB — POCT INR: INR: 2.2

## 2013-04-20 NOTE — Patient Instructions (Addendum)
Anticoagulation Dose Instructions as of 04/20/2013     Glynis Smiles Tue Wed Thu Fri Sat   New Dose 2.5 mg 2.5 mg 2.5 mg 2.5 mg 2.5 mg 2.5 mg 2.5 mg    Description       Continue 2.5mg  -  1 tablet daily        INR was 2.2 today

## 2013-04-20 NOTE — Progress Notes (Signed)
Also checked BP today.  Current medications for BP include:  Lisinopril HCTZ 20/25mg  daily, amlodipine 5mg  daily, diltiazem 120mg  daily and she is suppose to take clonidine 0.1mg  at bedtime but she has not been taking this.   No LEE today Patient denies constipation  She has atrial fibrillation and in June was in hospital for change in medications to regulate heart rate.  She is currently taking diltiazem and rythmol / propafenone for rate control.   Filed Vitals:   04/20/13 1509  BP: 146/68  Pulse: 68   Assessment: BP and rate controlled today but unsure of patient being on both diltiazem and amlopidine since both are CCB's  Plan:   Recommended continue current medications since BP good today.  I have contacted patient's cardiologist at [facility] Dr. Sharol Harness and left a message with his nurse.  Would like his opinion about increasing diltiazem to 180mg  and discontinuing amlodipine.

## 2013-05-12 DIAGNOSIS — I340 Nonrheumatic mitral (valve) insufficiency: Secondary | ICD-10-CM | POA: Insufficient documentation

## 2013-05-12 DIAGNOSIS — I272 Pulmonary hypertension, unspecified: Secondary | ICD-10-CM | POA: Insufficient documentation

## 2013-05-21 ENCOUNTER — Ambulatory Visit (INDEPENDENT_AMBULATORY_CARE_PROVIDER_SITE_OTHER): Payer: Medicare Other | Admitting: Pharmacist

## 2013-05-21 ENCOUNTER — Encounter (INDEPENDENT_AMBULATORY_CARE_PROVIDER_SITE_OTHER): Payer: Self-pay

## 2013-05-21 VITALS — BP 140/66 | HR 64

## 2013-05-21 DIAGNOSIS — Z7901 Long term (current) use of anticoagulants: Secondary | ICD-10-CM

## 2013-05-21 DIAGNOSIS — I4891 Unspecified atrial fibrillation: Secondary | ICD-10-CM

## 2013-05-21 LAB — POCT INR: INR: 2.2

## 2013-05-21 NOTE — Progress Notes (Signed)
Had planned to check BMET and TSH today but patient had labs at [facility] / Cardiologist 05/18/13.  TSH was normal and calcium and renal function back to normal.

## 2013-05-21 NOTE — Patient Instructions (Signed)
Anticoagulation Dose Instructions as of 05/21/2013     Meredith Anderson Tue Wed Thu Fri Sat   New Dose 2.5 mg 2.5 mg 2.5 mg 2.5 mg 2.5 mg 2.5 mg 2.5 mg    Description       Continue 2.5mg  -  1 tablet daily       INR was 2.2 today

## 2013-05-31 ENCOUNTER — Other Ambulatory Visit: Payer: Self-pay | Admitting: General Practice

## 2013-06-05 ENCOUNTER — Other Ambulatory Visit: Payer: Self-pay | Admitting: *Deleted

## 2013-06-05 ENCOUNTER — Encounter: Payer: Self-pay | Admitting: *Deleted

## 2013-06-05 MED ORDER — ROSUVASTATIN CALCIUM 20 MG PO TABS: 20.0000 mg | ORAL_TABLET | Freq: Every day | ORAL | Status: DC

## 2013-06-08 ENCOUNTER — Encounter: Payer: Self-pay | Admitting: Family Medicine

## 2013-06-08 ENCOUNTER — Ambulatory Visit (INDEPENDENT_AMBULATORY_CARE_PROVIDER_SITE_OTHER): Payer: Medicare Other | Admitting: Family Medicine

## 2013-06-08 VITALS — BP 137/72 | HR 67 | Temp 98.2°F | Ht 64.0 in | Wt 157.2 lb

## 2013-06-08 DIAGNOSIS — M25512 Pain in left shoulder: Secondary | ICD-10-CM

## 2013-06-08 DIAGNOSIS — J069 Acute upper respiratory infection, unspecified: Secondary | ICD-10-CM

## 2013-06-08 DIAGNOSIS — M25519 Pain in unspecified shoulder: Secondary | ICD-10-CM

## 2013-06-08 MED ORDER — AMOXICILLIN 875 MG PO TABS: 875.0000 mg | ORAL_TABLET | Freq: Two times a day (BID) | ORAL | Status: DC

## 2013-06-08 MED ORDER — METHYLPREDNISOLONE (PAK) 4 MG PO TABS: ORAL_TABLET | ORAL | Status: DC

## 2013-06-08 NOTE — Progress Notes (Signed)
   Subjective:    Patient ID: Meredith Anderson, female    DOB: 07-06-1940, 72 y.o.   MRN: [id number]  HPI  This 72 y.o. female presents for evaluation of left shoulder discomfort.  She is  Having URI sx's for over a week.  Review of Systems C/o left shoulder discomfort and URI sx's.   No chest pain, SOB, HA, dizziness, vision change, N/V, diarrhea, constipation, dysuria, urinary urgency or frequency, myalgias, arthralgias or rash.  Objective:   Physical Exam  Vital signs noted  Well developed well nourished female.  HEENT - Head atraumatic Normocephalic                Eyes - PERRLA, Conjuctiva - clear Sclera- Clear EOMI                Ears - EAC's Wnl TM's Wnl Gross Hearing WNL                Throat - oropharanx wnl Respiratory - Lungs CTA bilateral Cardiac - RRR S1 and S2 without murmur GI - Abdomen soft Nontender and bowel sounds active x 4 Extremities - No edema. Neuro - Grossly intact. MS - TTP left shoulder, positive NEER and Hawkins Left shoulder.     Assessment & Plan:  Left shoulder pain - Plan: methylPREDNIsolone (MEDROL DOSPACK) 4 MG tablet as directed. Circumduction exercises left shoulder.    URI (upper respiratory infection) - Plan: amoxicillin (AMOXIL) 875 MG tablet po bid x 10 days  Push po fluids, rest, tylenol and motrin otc prn as directed for fever, arthralgias, and myalgias.  Follow up prn if sx's continue or persist.  Deatra Canter FNP

## 2013-06-23 DIAGNOSIS — I471 Supraventricular tachycardia: Secondary | ICD-10-CM | POA: Insufficient documentation

## 2013-07-03 ENCOUNTER — Telehealth: Payer: Self-pay | Admitting: Family Medicine

## 2013-07-05 ENCOUNTER — Other Ambulatory Visit: Payer: Self-pay | Admitting: Family Medicine

## 2013-07-06 ENCOUNTER — Encounter: Payer: Self-pay | Admitting: Family Medicine

## 2013-07-06 ENCOUNTER — Ambulatory Visit (INDEPENDENT_AMBULATORY_CARE_PROVIDER_SITE_OTHER): Payer: Medicare HMO | Admitting: Family Medicine

## 2013-07-06 VITALS — BP 116/64 | HR 61 | Temp 97.7°F | Ht 64.0 in | Wt 159.4 lb

## 2013-07-06 DIAGNOSIS — E559 Vitamin D deficiency, unspecified: Secondary | ICD-10-CM

## 2013-07-06 DIAGNOSIS — E119 Type 2 diabetes mellitus without complications: Secondary | ICD-10-CM

## 2013-07-06 DIAGNOSIS — Z7901 Long term (current) use of anticoagulants: Secondary | ICD-10-CM

## 2013-07-06 DIAGNOSIS — E039 Hypothyroidism, unspecified: Secondary | ICD-10-CM

## 2013-07-06 DIAGNOSIS — I4891 Unspecified atrial fibrillation: Secondary | ICD-10-CM

## 2013-07-06 LAB — POCT CBC
Granulocyte percent: 72.7 %G (ref 37–80)
HCT, POC: 37.4 % — AB (ref 37.7–47.9)
Hemoglobin: 12.5 g/dL (ref 12.2–16.2)
Lymph, poc: 1.3 (ref 0.6–3.4)
MCH, POC: 30.8 pg (ref 27–31.2)
MCHC: 33.3 g/dL (ref 31.8–35.4)
MCV: 92.4 fL (ref 80–97)
MPV: 8.8 fL (ref 0–99.8)
POC Granulocyte: 4.3 (ref 2–6.9)
POC LYMPH PERCENT: 22.4 %L (ref 10–50)
Platelet Count, POC: 219 10*3/uL (ref 142–424)
RBC: 4.1 M/uL (ref 4.04–5.48)
RDW, POC: 12.6 %
WBC: 5.9 10*3/uL (ref 4.6–10.2)

## 2013-07-06 LAB — POCT GLYCOSYLATED HEMOGLOBIN (HGB A1C): Hemoglobin A1C: 6.3

## 2013-07-06 LAB — POCT INR: INR: 1.6

## 2013-07-06 MED ORDER — GLIPIZIDE ER 5 MG PO TB24: 5.0000 mg | ORAL_TABLET | ORAL | Status: DC

## 2013-07-06 NOTE — Progress Notes (Signed)
   Subjective:    Patient ID: Meredith Anderson, female    DOB: [birth year], 73 y.o.   MRN: [id number]  HPI  This 73 y.o. female presents for evaluation of pre-operative evaluation.  She has seen cardiology and  Has had cardiology clearance.  She has hx of atrial fibrillation and is on chronic coumadin therapy. She has had PTINR this am and it is subtherapeutic at 1.6.  She has hx diabetes and her glucose has been elevated since she has had the steroid injection in her left shoulder.  She has been taking Januvia and has been having difficulty affording this.  She would like to change to a generic medicine. She is due for labs.    Review of Systems    No chest pain, SOB, HA, dizziness, vision change, N/V, diarrhea, constipation, dysuria, urinary urgency or frequency, myalgias, arthralgias or rash.  Objective:   Physical Exam  Vital signs noted  Well developed well nourished female.  HEENT - Head atraumatic Normocephalic                Eyes - PERRLA, Conjuctiva - clear Sclera- Clear EOMI                Ears - EAC's Wnl TM's Wnl Gross Hearing WNL                Nose - Nares patent                 Throat - oropharanx wnl Respiratory - Lungs CTA bilateral Cardiac - RRR S1 and S2 without murmur GI - Abdomen soft Nontender and bowel sounds active x 4 Extremities - No edema. Neuro - Grossly intact.      Results for orders placed in visit on 07/06/13  POCT INR      Result Value Range   INR 1.6    POCT CBC      Result Value Range   WBC 5.9  4.6 - 10.2 K/uL   Lymph, poc 1.3  0.6 - 3.4   POC LYMPH PERCENT 22.4  10 - 50 %L   POC Granulocyte 4.3  2 - 6.9   Granulocyte percent 72.7  37 - 80 %G   RBC 4.1  4.04 - 5.48 M/uL   Hemoglobin 12.5  12.2 - 16.2 g/dL   HCT, POC 37.4 (*) 37.7 - 47.9 %   MCV 92.4  80 - 97 fL   MCH, POC 30.8  27 - 31.2 pg   MCHC 33.3  31.8 - 35.4 g/dL   RDW, POC 12.6     Platelet Count, POC 219.0  142 - 424 K/uL   MPV 8.8  0 - 99.8 fL  POCT GLYCOSYLATED  HEMOGLOBIN (HGB A1C)      Result Value Range   Hemoglobin A1C 6.3     Assessment & Plan:  Atrial fibrillation - Plan: POCT INR.  Continue current coumadin and stop coumadin Saturday 5 days before surgery and restart coumadin day after surgery.  Long term (current) use of anticoagulants - Plan: POCT INR.  Continue current regimen.  Diabetes - Plan: glipiZIDE (GLUCOTROL XL) 5 MG 24 hr tablet, POCT CBC, POCT glycosylated hemoglobin (Hb A1C), CMP14+EGFR  Unspecified hypothyroidism - Plan: Thyroid Panel With TSH  Unspecified vitamin D deficiency - Plan: Vit D  25 hydroxy (rtn osteoporosis monitoring).  Lysbeth Penner FNP

## 2013-07-06 NOTE — Patient Instructions (Signed)
Anticoagulation Dose Instructions as of 07/06/2013     Meredith Anderson Tue Wed Thu Fri Sat   New Dose 2.5 mg 2.5 mg 2.5 mg 2.5 mg 2.5 mg 2.5 mg 2.5 mg    Description       Continue 2.5mg  -  1 tablet daily and stop coumadin 5 days prior to surgery and resume day after surgery.

## 2013-07-07 LAB — CMP14+EGFR
ALT: 16 IU/L (ref 0–32)
AST: 18 IU/L (ref 0–40)
Albumin/Globulin Ratio: 1.6 (ref 1.1–2.5)
Albumin: 4.7 g/dL (ref 3.5–4.8)
Alkaline Phosphatase: 98 IU/L (ref 39–117)
BUN/Creatinine Ratio: 19 (ref 11–26)
BUN: 20 mg/dL (ref 8–27)
CO2: 22 mmol/L (ref 18–29)
Calcium: 9.8 mg/dL (ref 8.6–10.2)
Chloride: 95 mmol/L — ABNORMAL LOW (ref 97–108)
Creatinine, Ser: 1.04 mg/dL — ABNORMAL HIGH (ref 0.57–1.00)
GFR calc Af Amer: 62 mL/min/{1.73_m2} (ref >59)
GFR calc non Af Amer: 54 mL/min/{1.73_m2} — ABNORMAL LOW (ref >59)
Globulin, Total: 2.9 g/dL (ref 1.5–4.5)
Glucose: 112 mg/dL — ABNORMAL HIGH (ref 65–99)
Potassium: 4.1 mmol/L (ref 3.5–5.2)
Sodium: 138 mmol/L (ref 134–144)
Total Bilirubin: 0.3 mg/dL (ref 0.0–1.2)
Total Protein: 7.6 g/dL (ref 6.0–8.5)

## 2013-07-07 LAB — THYROID PANEL WITH TSH
Free Thyroxine Index: 2.7 (ref 1.2–4.9)
T3 Uptake Ratio: 29 % (ref 24–39)
T4, Total: 9.3 ug/dL (ref 4.5–12.0)
TSH: 4.74 u[IU]/mL — ABNORMAL HIGH (ref 0.450–4.500)

## 2013-07-07 LAB — VITAMIN D 25 HYDROXY (VIT D DEFICIENCY, FRACTURES): Vit D, 25-Hydroxy: 21.1 ng/mL — ABNORMAL LOW (ref 30.0–100.0)

## 2013-07-09 ENCOUNTER — Other Ambulatory Visit: Payer: Self-pay | Admitting: Family Medicine

## 2013-07-09 ENCOUNTER — Other Ambulatory Visit: Payer: Self-pay

## 2013-07-09 DIAGNOSIS — E119 Type 2 diabetes mellitus without complications: Secondary | ICD-10-CM

## 2013-07-09 MED ORDER — VITAMIN D (ERGOCALCIFEROL) 1.25 MG (50000 UNIT) PO CAPS: 50000.0000 [IU] | ORAL_CAPSULE | ORAL | Status: DC

## 2013-07-09 MED ORDER — GLIPIZIDE ER 5 MG PO TB24: ORAL_TABLET | ORAL | Status: DC

## 2013-07-10 ENCOUNTER — Other Ambulatory Visit: Payer: Self-pay | Admitting: Nurse Practitioner

## 2013-07-10 ENCOUNTER — Ambulatory Visit: Payer: Medicare Other | Admitting: Family Medicine

## 2013-07-15 ENCOUNTER — Telehealth: Payer: Self-pay | Admitting: *Deleted

## 2013-07-15 NOTE — Telephone Encounter (Signed)
Pt notified and verbalized understanding.

## 2013-07-17 DIAGNOSIS — N814 Uterovaginal prolapse, unspecified: Secondary | ICD-10-CM | POA: Insufficient documentation

## 2013-07-22 ENCOUNTER — Other Ambulatory Visit: Payer: Self-pay | Admitting: General Practice

## 2013-07-22 DIAGNOSIS — E119 Type 2 diabetes mellitus without complications: Secondary | ICD-10-CM

## 2013-08-13 ENCOUNTER — Ambulatory Visit (INDEPENDENT_AMBULATORY_CARE_PROVIDER_SITE_OTHER): Payer: Medicare HMO | Admitting: Family Medicine

## 2013-08-13 ENCOUNTER — Encounter: Payer: Self-pay | Admitting: Family Medicine

## 2013-08-13 VITALS — BP 142/72 | HR 63 | Temp 98.2°F | Ht 64.0 in | Wt 157.0 lb

## 2013-08-13 DIAGNOSIS — E119 Type 2 diabetes mellitus without complications: Secondary | ICD-10-CM

## 2013-08-13 DIAGNOSIS — Z7901 Long term (current) use of anticoagulants: Secondary | ICD-10-CM

## 2013-08-13 DIAGNOSIS — I4891 Unspecified atrial fibrillation: Secondary | ICD-10-CM

## 2013-08-13 LAB — POCT INR: INR: 2

## 2013-08-13 MED ORDER — GLIPIZIDE ER 5 MG PO TB24: 5.0000 mg | ORAL_TABLET | Freq: Every day | ORAL | Status: DC

## 2013-08-13 NOTE — Patient Instructions (Addendum)
   Anticoagulation Dose Instructions as of 08/13/2013     Dorene Grebe Tue Wed Thu Fri Sat   New Dose 2.5 mg 2.5 mg 2.5 mg 2.5 mg 2.5 mg 2.5 mg 2.5 mg    Description       Continue current coumadin regimen and follow up in 1 month for ptinr

## 2013-08-13 NOTE — Progress Notes (Signed)
   Subjective:    Patient ID: Meredith Anderson, female    DOB: 07-02-1941, 73 y.o.   MRN: [id number]  HPI  This 73 y.o. female presents for evaluation of elevated blood sugar.  She has glucose readings of  100 - 160.  She is taking glipizide 5mg  po qd.  She admits to eating too many sweets.  She has started back on her coumadin after surgery and is here to get her INR checked.  Review of Systems    No chest pain, SOB, HA, dizziness, vision change, N/V, diarrhea, constipation, dysuria, urinary urgency or frequency, myalgias, arthralgias or rash.  Objective:   Physical Exam Vital signs noted  Well developed well nourished female.  HEENT - Head atraumatic Normocephalic                Eyes - PERRLA, Conjuctiva - clear Sclera- Clear EOMI                Ears - EAC's Wnl TM's Wnl Gross Hearing WNL Respiratory - Lungs CTA bilateral Cardiac - RRR S1 and S2 without murmur GI - Abdomen soft Nontender and bowel sounds active x 4   Results for orders placed in visit on 08/13/13  POCT INR      Result Value Ref Range   INR 2.0         Assessment & Plan:  Atrial fibrillation - Plan: POCT INR  Long term (current) use of anticoagulants - Plan: POCT INR.  Take   Diabetes - Plan: glipiZIDE (GLUCOTROL XL) 5 MG 24 hr tablet  Hyperlipidemia - Crestor 20mg  one po qd #56 samples given  Lysbeth Penner FNP

## 2013-10-08 ENCOUNTER — Telehealth: Payer: Self-pay | Admitting: Family Medicine

## 2013-10-08 NOTE — Telephone Encounter (Signed)
error 

## 2013-10-09 ENCOUNTER — Ambulatory Visit (INDEPENDENT_AMBULATORY_CARE_PROVIDER_SITE_OTHER): Payer: Medicare HMO | Admitting: Family Medicine

## 2013-10-09 ENCOUNTER — Encounter: Payer: Self-pay | Admitting: Family Medicine

## 2013-10-09 VITALS — BP 140/74 | HR 67 | Temp 98.2°F | Ht 64.0 in | Wt 160.0 lb

## 2013-10-09 DIAGNOSIS — Z7901 Long term (current) use of anticoagulants: Secondary | ICD-10-CM

## 2013-10-09 DIAGNOSIS — I4891 Unspecified atrial fibrillation: Secondary | ICD-10-CM

## 2013-10-09 DIAGNOSIS — J069 Acute upper respiratory infection, unspecified: Secondary | ICD-10-CM

## 2013-10-09 LAB — POCT INR: INR: 1.9

## 2013-10-09 MED ORDER — AMOXICILLIN 875 MG PO TABS: 875.0000 mg | ORAL_TABLET | Freq: Two times a day (BID) | ORAL | Status: DC

## 2013-10-09 MED ORDER — BENZONATATE 100 MG PO CAPS: 100.0000 mg | ORAL_CAPSULE | Freq: Three times a day (TID) | ORAL | Status: DC | PRN

## 2013-10-09 NOTE — Progress Notes (Signed)
   Subjective:    Patient ID: Meredith Anderson, female    DOB: 1940-10-31, 72 y.o.   MRN: [id number]  HPI This 73 y.o. female presents for evaluation of c/o uri and is wanting to get her inr checked.   Review of Systems    No chest pain, SOB, HA, dizziness, vision change, N/V, diarrhea, constipation, dysuria, urinary urgency or frequency, myalgias, arthralgias or rash.  Objective:   Physical Exam Vital signs noted  Well developed well nourished female.  HEENT - Head atraumatic Normocephalic                Eyes - PERRLA, Conjuctiva - clear Sclera- Clear EOMI                Ears - EAC's Wnl TM's Wnl Gross Hearing WNL                Throat - oropharanx wnl Respiratory - Lungs CTA bilateral Cardiac - RRR S1 and S2 without murmur GI - Abdomen soft Nontender and bowel sounds active x 4       Assessment & Plan:  Atrial fibrillation - Plan: POCT INR, amoxicillin (AMOXIL) 875 MG tablet, benzonatate (TESSALON PERLES) 100 MG capsule  Long term (current) use of anticoagulants - Plan: POCT INR, amoxicillin (AMOXIL) 875 MG tablet, benzonatate (TESSALON PERLES) 100 MG capsule  URI (upper respiratory infection) - Plan: amoxicillin (AMOXIL) 875 MG tablet, benzonatate (TESSALON PERLES) 100 MG capsule  Push po fluids, rest, tylenol and motrin otc prn as directed for fever, arthralgias, and myalgias.  Follow up prn if sx's continue or persist.  Lysbeth Penner FNP

## 2013-10-09 NOTE — Patient Instructions (Signed)
Anticoagulation Dose Instructions as of 10/09/2013     Meredith Anderson Tue Wed Thu Fri Sat   New Dose 2.5 mg 2.5 mg 2.5 mg 2.5 mg 2.5 mg 2.5 mg 2.5 mg    Description       Continue current coumadin regimen and follow up next week for repeat inr

## 2013-10-12 ENCOUNTER — Other Ambulatory Visit: Payer: Self-pay | Admitting: Family Medicine

## 2013-10-19 ENCOUNTER — Ambulatory Visit (INDEPENDENT_AMBULATORY_CARE_PROVIDER_SITE_OTHER): Payer: Medicare HMO | Admitting: Pharmacist

## 2013-10-19 DIAGNOSIS — I4891 Unspecified atrial fibrillation: Secondary | ICD-10-CM

## 2013-10-19 DIAGNOSIS — Z7901 Long term (current) use of anticoagulants: Secondary | ICD-10-CM

## 2013-10-19 LAB — POCT INR: INR: 2

## 2013-10-19 NOTE — Patient Instructions (Signed)
Anticoagulation Dose Instructions as of 10/19/2013     Dorene Grebe Tue Wed Thu Fri Sat   New Dose 2.5 mg 2.5 mg 2.5 mg 2.5 mg 2.5 mg 2.5 mg 2.5 mg    Description       Continue current coumadin/warfarin regimen of 1 tablet daily      INR was 2.0 today

## 2013-11-06 ENCOUNTER — Other Ambulatory Visit: Payer: Self-pay

## 2013-11-06 MED ORDER — DILTIAZEM HCL ER COATED BEADS 120 MG PO CP24: 120.0000 mg | ORAL_CAPSULE | Freq: Every day | ORAL | Status: DC

## 2013-11-19 ENCOUNTER — Ambulatory Visit (INDEPENDENT_AMBULATORY_CARE_PROVIDER_SITE_OTHER): Payer: Medicare HMO | Admitting: Pharmacist

## 2013-11-19 DIAGNOSIS — Z7901 Long term (current) use of anticoagulants: Secondary | ICD-10-CM

## 2013-11-19 DIAGNOSIS — I4891 Unspecified atrial fibrillation: Secondary | ICD-10-CM

## 2013-11-19 LAB — POCT INR: INR: 2.1

## 2013-11-19 NOTE — Patient Instructions (Signed)
Anticoagulation Dose Instructions as of 11/19/2013     Meredith Anderson Tue Wed Thu Fri Sat   New Dose 2.5 mg 2.5 mg 2.5 mg 2.5 mg 2.5 mg 2.5 mg 2.5 mg    Description       Continue current coumadin/warfarin regimen of 1 tablet daily      INR was 2.1 today

## 2013-12-15 ENCOUNTER — Telehealth: Payer: Self-pay | Admitting: Family Medicine

## 2013-12-16 MED ORDER — ROSUVASTATIN CALCIUM 20 MG PO TABS: 20.0000 mg | ORAL_TABLET | Freq: Every day | ORAL | Status: DC

## 2013-12-16 NOTE — Telephone Encounter (Signed)
No samples available at this time

## 2013-12-16 NOTE — Telephone Encounter (Signed)
Patient aware. Script sent to [facility] per patient's request.

## 2013-12-30 ENCOUNTER — Ambulatory Visit (INDEPENDENT_AMBULATORY_CARE_PROVIDER_SITE_OTHER): Payer: Medicare HMO | Admitting: Pharmacist

## 2013-12-30 DIAGNOSIS — I4891 Unspecified atrial fibrillation: Secondary | ICD-10-CM

## 2013-12-30 DIAGNOSIS — Z7901 Long term (current) use of anticoagulants: Secondary | ICD-10-CM

## 2013-12-30 LAB — POCT INR: INR: 2.4

## 2013-12-30 NOTE — Patient Instructions (Signed)
Anticoagulation Dose Instructions as of 12/30/2013     Meredith Anderson Tue Wed Thu Fri Sat   New Dose 2.5 mg 2.5 mg 2.5 mg 2.5 mg 2.5 mg 2.5 mg 2.5 mg    Description       Continue current coumadin/warfarin regimen of 1 tablet daily      INR was 2.4 today

## 2014-01-14 ENCOUNTER — Telehealth: Payer: Self-pay | Admitting: Pharmacist

## 2014-01-14 NOTE — Telephone Encounter (Signed)
Patient's HR was elevated so she went to ER.  They did not find anything wrong with HR / heart but she had UTI.  Started cipro for UTI for 7 days.  Recommend take 2 tablets today then resume 1 tablet daily since cipro can increase INR.  appt given to recheck INR 01/19/14.

## 2014-01-19 ENCOUNTER — Ambulatory Visit (INDEPENDENT_AMBULATORY_CARE_PROVIDER_SITE_OTHER): Payer: Medicare HMO | Admitting: Pharmacist

## 2014-01-19 DIAGNOSIS — Z7901 Long term (current) use of anticoagulants: Secondary | ICD-10-CM

## 2014-01-19 DIAGNOSIS — I4891 Unspecified atrial fibrillation: Secondary | ICD-10-CM

## 2014-01-19 LAB — POCT INR: INR: 1.7

## 2014-01-19 NOTE — Patient Instructions (Signed)
Anticoagulation Dose Instructions as of 01/19/2014     Meredith Anderson Tue Wed Thu Fri Sat   New Dose 2.5 mg 2.5 mg 2.5 mg 2.5 mg 2.5 mg 2.5 mg 2.5 mg    Description       Take 2 tablets today, then continue current coumadin/warfarin regimen of 1 tablet daily      INR was 1.7 today

## 2014-01-26 ENCOUNTER — Telehealth: Payer: Self-pay | Admitting: Family Medicine

## 2014-01-26 ENCOUNTER — Ambulatory Visit (INDEPENDENT_AMBULATORY_CARE_PROVIDER_SITE_OTHER): Payer: Medicare HMO | Admitting: Family Medicine

## 2014-01-26 ENCOUNTER — Encounter: Payer: Self-pay | Admitting: Family Medicine

## 2014-01-26 VITALS — BP 109/66 | HR 72 | Temp 98.7°F | Ht 64.0 in | Wt 163.0 lb

## 2014-01-26 DIAGNOSIS — J209 Acute bronchitis, unspecified: Secondary | ICD-10-CM

## 2014-01-26 MED ORDER — AMOXICILLIN 875 MG PO TABS: 875.0000 mg | ORAL_TABLET | Freq: Two times a day (BID) | ORAL | Status: DC

## 2014-01-26 NOTE — Progress Notes (Signed)
   Subjective:    Patient ID: Meredith Anderson, female    DOB: Feb 04, 1941, 73 y.o.   MRN: [id number]  HPI This 73 y.o. female presents for evaluation of URI.  She has been congested and has been feeling tired.  She was at her cardiologist office today and was told he could hear RALES with her breath sounds and to follow up with her PCP.   Review of Systems C/o uri sx's   No chest pain, SOB, HA, dizziness, vision change, N/V, diarrhea, constipation, dysuria, urinary urgency or frequency, myalgias, arthralgias or rash.  Objective:   Physical Exam   Vital signs noted  Well developed well nourished female.  HEENT - Head atraumatic Normocephalic                Eyes - PERRLA, Conjuctiva - clear Sclera- Clear EOMI                Ears - EAC's Wnl TM's Wnl Gross Hearing WNL                Throat - oropharanx wnl Respiratory - Lungs rales right middle lobe  Cardiac - RRR S1 and S2 without murmur GI - Abdomen soft Nontender and bowel sounds active x 4 Extremities - No edema. Neuro - Grossly intact.     Assessment & Plan:  Acute bronchitis, unspecified organism - Plan: amoxicillin (AMOXIL) 875 MG tablet, DG Chest 2 View Push po fluids, rest, tylenol and motrin otc prn as directed for fever, arthralgias, and myalgias.  Follow up prn if sx's continue or persist.  Follow up for cxr in 2 days and in one week for follow up.  Lysbeth Penner FNP

## 2014-01-27 DIAGNOSIS — J189 Pneumonia, unspecified organism: Secondary | ICD-10-CM | POA: Insufficient documentation

## 2014-01-27 NOTE — Telephone Encounter (Signed)
Appt scheduled

## 2014-02-02 ENCOUNTER — Encounter: Payer: Self-pay | Admitting: Family Medicine

## 2014-02-02 ENCOUNTER — Ambulatory Visit (INDEPENDENT_AMBULATORY_CARE_PROVIDER_SITE_OTHER): Payer: Medicare HMO

## 2014-02-02 ENCOUNTER — Ambulatory Visit (INDEPENDENT_AMBULATORY_CARE_PROVIDER_SITE_OTHER): Payer: Medicare HMO | Admitting: Family Medicine

## 2014-02-02 VITALS — BP 162/77 | HR 67 | Temp 97.8°F | Ht 64.0 in | Wt 160.0 lb

## 2014-02-02 DIAGNOSIS — J189 Pneumonia, unspecified organism: Secondary | ICD-10-CM

## 2014-02-02 DIAGNOSIS — I4891 Unspecified atrial fibrillation: Secondary | ICD-10-CM

## 2014-02-02 DIAGNOSIS — Z7901 Long term (current) use of anticoagulants: Secondary | ICD-10-CM

## 2014-02-02 DIAGNOSIS — J90 Pleural effusion, not elsewhere classified: Secondary | ICD-10-CM

## 2014-02-02 LAB — POCT CBC
Granulocyte percent: 70.6 %G (ref 37–80)
HCT, POC: 33.6 % — AB (ref 37.7–47.9)
Hemoglobin: 11 g/dL — AB (ref 12.2–16.2)
Lymph, poc: 1.2 (ref 0.6–3.4)
MCH, POC: 28.3 pg (ref 27–31.2)
MCHC: 32.6 g/dL (ref 31.8–35.4)
MCV: 86.6 fL (ref 80–97)
MPV: 7.2 fL (ref 0–99.8)
POC Granulocyte: 4 (ref 2–6.9)
POC LYMPH PERCENT: 20.7 %L (ref 10–50)
Platelet Count, POC: 383 10*3/uL (ref 142–424)
RBC: 3.9 M/uL — AB (ref 4.04–5.48)
RDW, POC: 15.7 %
WBC: 5.7 10*3/uL (ref 4.6–10.2)

## 2014-02-02 LAB — POCT INR: INR: 2.7

## 2014-02-02 NOTE — Progress Notes (Signed)
   Subjective:    Patient ID: Meredith Anderson, female    DOB: October 05, 1940, 73 y.o.   MRN: [id number]  HPI  This 73 y.o. female presents for evaluation of pneumonia.  She was seen a week ago for bronchitis and was tx'd with amoxicillin and [facility]'t [facility] and was hospitalized and tx with  levofloxacin 750mg  po qd.  She has one more tablet of levaquin.  She is not having fever. She is c/o DOE.  Review of chart for hospitalization at [facility] showing bilateral pneumonia with  Small left pleural effusion.  She is c/o severe SOB and DOE.  Review of Systems C/o fatigue and SOB   No chest pain, HA, dizziness, vision change, N/V, diarrhea, constipation, dysuria, urinary urgency or frequency, myalgias, arthralgias or rash.  Objective:   Physical Exam  Vital signs noted  Well developed well nourished female  HEENT - Head atraumatic Normocephalic                Eyes - PERRLA, Conjuctiva - clear Sclera- Clear EOMI                Ears - EAC's Wnl TM's Wnl Gross Hearing WNL                Throat - oropharanx wnl Respiratory - Lungs CTA bilateral Cardiac - RRR S1 and S2 without murmur GI - Abdomen soft Nontender and bowel sounds active x 4 Extremities - No edema. Neuro - Grossly intact. Results for orders placed in visit on 02/02/14  POCT INR      Result Value Ref Range   INR 2.7    POCT CBC      Result Value Ref Range   WBC 5.7  4.6 - 10.2 K/uL   Lymph, poc 1.2  0.6 - 3.4   POC LYMPH PERCENT 20.7  10 - 50 %L   POC Granulocyte 4.0  2 - 6.9   Granulocyte percent 70.6  37 - 80 %G   RBC 3.9 (*) 4.04 - 5.48 M/uL   Hemoglobin 11.0 (*) 12.2 - 16.2 g/dL   HCT, POC 33.6 (*) 37.7 - 47.9 %   MCV 86.6  80 - 97 fL   MCH, POC 28.3  27 - 31.2 pg   MCHC 32.6  31.8 - 35.4 g/dL   RDW, POC 15.7     Platelet Count, POC 383.0  142 - 424 K/uL   MPV 7.2  0 - 99.8 fL   Results for orders placed in visit on 02/02/14  POCT  INR      Result Value Ref Range   INR 2.7       CXR - Left pleural effusion Prelimnary reading by Gwyndolyn Saxon [place],FNP Assessment & Plan:  Atrial fibrillation - Plan: POCT INR  Long term (current) use of anticoagulants - Plan: POCT INR.  Recommend to continue current regimen and then follow up in 2 weeks with clinical pharmacist.  Pneumonia - CXR and continue levaquin 750mg .  CBC shows she is not failing levaquin.  Left Pleural Effusion - CXR - Left lateral decubitus film and pulmonary referral.  Lysbeth Penner FNP

## 2014-02-02 NOTE — Patient Instructions (Addendum)
Anticoagulation Dose Instructions as of 02/02/2014     Meredith Anderson Tue Wed Thu Fri Sat   New Dose 2.5 mg 2.5 mg 2.5 mg 2.5 mg 2.5 mg 2.5 mg 2.5 mg    Description       Continue current regimen and follow up in 2 weeks

## 2014-02-05 ENCOUNTER — Ambulatory Visit: Payer: Self-pay | Admitting: Family Medicine

## 2014-02-09 ENCOUNTER — Other Ambulatory Visit: Payer: Self-pay | Admitting: Family Medicine

## 2014-02-10 ENCOUNTER — Ambulatory Visit (INDEPENDENT_AMBULATORY_CARE_PROVIDER_SITE_OTHER): Payer: Medicare HMO | Admitting: Family Medicine

## 2014-02-10 ENCOUNTER — Ambulatory Visit: Payer: Medicare HMO

## 2014-02-10 VITALS — BP 149/79 | HR 68 | Temp 96.9°F | Ht 64.0 in | Wt 161.2 lb

## 2014-02-10 DIAGNOSIS — R0602 Shortness of breath: Secondary | ICD-10-CM

## 2014-02-10 DIAGNOSIS — R0789 Other chest pain: Secondary | ICD-10-CM

## 2014-02-10 DIAGNOSIS — J90 Pleural effusion, not elsewhere classified: Secondary | ICD-10-CM

## 2014-02-10 LAB — POCT CBC
Granulocyte percent: 71.7 %G (ref 37–80)
HCT, POC: 35.9 % — AB (ref 37.7–47.9)
Hemoglobin: 11.7 g/dL — AB (ref 12.2–16.2)
Lymph, poc: 1.7 (ref 0.6–3.4)
MCH, POC: 28.8 pg (ref 27–31.2)
MCHC: 32.6 g/dL (ref 31.8–35.4)
MCV: 88.2 fL (ref 80–97)
MPV: 7.2 fL (ref 0–99.8)
POC Granulocyte: 5.9 (ref 2–6.9)
POC LYMPH PERCENT: 21.1 %L (ref 10–50)
Platelet Count, POC: 423 10*3/uL (ref 142–424)
RBC: 4.1 M/uL (ref 4.04–5.48)
RDW, POC: 16.6 %
WBC: 8.2 10*3/uL (ref 4.6–10.2)

## 2014-02-10 MED ORDER — TRAMADOL HCL 50 MG PO TABS: 50.0000 mg | ORAL_TABLET | Freq: Three times a day (TID) | ORAL | Status: DC | PRN

## 2014-02-10 MED ORDER — METHYLPREDNISOLONE (PAK) 4 MG PO TABS: ORAL_TABLET | ORAL | Status: DC

## 2014-02-10 NOTE — Progress Notes (Signed)
   Subjective:    Patient ID: EMMALENA CANNY, female    DOB: 1940/07/29, 73 y.o.   MRN: [id number]  HPI  Patient is here for c/o left chest pain and discomfort that was worse this am and she hurt more when taking a deep breathe.  She has had pneumonia and subsequent left pleural effusion mild to moderate with consolidation and she had left lateral decubitus in [facility] showing pleural effusion.  She is scheduled to see pulmonary 02/24/14.  She denies Sob but has some mild left chest discomfort and she notices this more with taking a deep breath.   Review of Systems    No chest pain, SOB, HA, dizziness, vision change, N/V, diarrhea, constipation, dysuria, urinary urgency or frequency, myalgias, arthralgias or rash.  Objective:   Physical Exam Vital signs noted  Well developed well nourished female.  HEENT - Head atraumatic Normocephalic                Eyes - PERRLA, Conjuctiva - clear Sclera- Clear EOMI                Ears - EAC's Wnl TM's Wnl Gross Hearing WNL                Throat - oropharanx wnl Respiratory - Lungs CTA bilateral Cardiac - RRR S1 and S2 without murmur GI - Abdomen soft Nontender and bowel sounds active x 4  EKG - SB with first degree AV block an no acute st-t changes  Lysbeth Penner FNP    CXR - Pleural effusion left chest Prelimnary reading by Gwyndolyn Saxon [place],FNP Assessment & Plan:  SOB (shortness of breath) - Plan: EKG 12-Lead, DG Chest 2 View, POCT CBC  Other chest pain - Plan: DG Chest 2 View, POCT CBC, methylPREDNIsolone (MEDROL DOSPACK) 4 MG tablet, traMADol (ULTRAM) 50 MG tablet  Pleural effusion - Plan: methylPREDNIsolone (MEDROL DOSPACK) 4 MG tablet, traMADol (ULTRAM) 50 MG tablet  Lysbeth Penner FNP

## 2014-02-13 ENCOUNTER — Telehealth: Payer: Self-pay | Admitting: Family Medicine

## 2014-02-13 NOTE — Telephone Encounter (Signed)
Patient called back and stated BP was 171/84 and BS now is 262.

## 2014-02-13 NOTE — Telephone Encounter (Addendum)
Patient saw Rush [facility]mark on 8/12 and he started her on prednisone her BP was 166/94 and BS was 255. She started prednisone on the 12th. Per Mae have patient check BP and BS around 11 am with readings

## 2014-02-13 NOTE — Telephone Encounter (Signed)
Per Mae continue to take prednisone. Monitor BP and BS. If BP gets higher than 175/90 to call provider on call and if BS gets higher than 275 to call provider on call. Looking back at BP readings here at the office was 149/79. Patients normal BS readings at home are 125-130. Patient told that if BS do not return to normal after completing Prednisone dose pack to follow up with PCP. Patient also notified if she has any chest pain, increased sob, to call 911 and get to the hospital. Patient also notified any light headiness to call provider on call.   BS now is 230.  BP now is 173/85.

## 2014-02-15 ENCOUNTER — Telehealth: Payer: Self-pay | Admitting: Pharmacist

## 2014-02-15 NOTE — Telephone Encounter (Signed)
Patient's INR was 3.5 in hospital - likely related to prednisone which she stopped 3 days ago.  Recommend she hold warfarin 1 day then restart regular warfarin dose.  RTC 02/19/14 to check INR .

## 2014-02-19 ENCOUNTER — Ambulatory Visit (INDEPENDENT_AMBULATORY_CARE_PROVIDER_SITE_OTHER): Payer: Medicare HMO | Admitting: Pharmacist

## 2014-02-19 DIAGNOSIS — E8881 Metabolic syndrome: Secondary | ICD-10-CM | POA: Insufficient documentation

## 2014-02-19 DIAGNOSIS — E782 Mixed hyperlipidemia: Secondary | ICD-10-CM

## 2014-02-19 DIAGNOSIS — R7989 Other specified abnormal findings of blood chemistry: Secondary | ICD-10-CM

## 2014-02-19 DIAGNOSIS — R739 Hyperglycemia, unspecified: Secondary | ICD-10-CM

## 2014-02-19 DIAGNOSIS — Z7901 Long term (current) use of anticoagulants: Secondary | ICD-10-CM

## 2014-02-19 DIAGNOSIS — R7309 Other abnormal glucose: Secondary | ICD-10-CM | POA: Insufficient documentation

## 2014-02-19 DIAGNOSIS — E785 Hyperlipidemia, unspecified: Secondary | ICD-10-CM | POA: Insufficient documentation

## 2014-02-19 DIAGNOSIS — I4891 Unspecified atrial fibrillation: Secondary | ICD-10-CM

## 2014-02-19 LAB — POCT INR: INR: 2.4

## 2014-02-19 NOTE — Patient Instructions (Signed)
Anticoagulation Dose Instructions as of 02/19/2014     Dorene Grebe Tue Wed Thu Fri Sat   New Dose 2.5 mg 2.5 mg 2.5 mg 2.5 mg 2.5 mg 2.5 mg 2.5 mg    Description       Continue current regimen and follow up in 2 weeks      INR was 2.4 today

## 2014-02-23 ENCOUNTER — Other Ambulatory Visit: Payer: Self-pay

## 2014-02-24 ENCOUNTER — Telehealth: Payer: Self-pay | Admitting: Family Medicine

## 2014-02-24 NOTE — Telephone Encounter (Signed)
Only thing she can do is take an extra 1/2 of clonidine- but i would rather her not do anything and see what it is tomorrow. The red face and burning sensation comes from prednisone

## 2014-02-24 NOTE — Telephone Encounter (Signed)
Patient was taking prednisone on 8-12,13,14. Her BPs have been high since then. It was 175/79 yesterday and 170/86 last night, This morning it is 152/79. Wants to know if she can take additional BP meds because her face is red and burning. She is no longer taking prednisone. Only took for those 3 days

## 2014-02-25 ENCOUNTER — Other Ambulatory Visit (INDEPENDENT_AMBULATORY_CARE_PROVIDER_SITE_OTHER): Payer: Medicare HMO

## 2014-02-25 DIAGNOSIS — E8881 Metabolic syndrome: Secondary | ICD-10-CM

## 2014-02-25 DIAGNOSIS — E782 Mixed hyperlipidemia: Secondary | ICD-10-CM

## 2014-02-25 DIAGNOSIS — R7989 Other specified abnormal findings of blood chemistry: Secondary | ICD-10-CM

## 2014-02-25 DIAGNOSIS — R7309 Other abnormal glucose: Secondary | ICD-10-CM

## 2014-02-25 LAB — POCT GLYCOSYLATED HEMOGLOBIN (HGB A1C): HEMOGLOBIN A1C: 6.8

## 2014-02-25 NOTE — Telephone Encounter (Signed)
Patient reports blood pressure readings of 123/75 and 119/70 this morning. Her face is not flushed and warm anymore either. She did not take any additional blood pressure medications.  She will f/u as needed.

## 2014-02-25 NOTE — Telephone Encounter (Signed)
Left message to call back with current blood pressure reading. Left message that elev blood pressure and the facial redness is coming from the prednisone and it stays in your system for several days.

## 2014-02-26 ENCOUNTER — Other Ambulatory Visit: Payer: Self-pay | Admitting: Family Medicine

## 2014-02-26 LAB — CMP14+EGFR
ALBUMIN: 4.5 g/dL (ref 3.5–4.8)
ALT: 26 IU/L (ref 0–32)
AST: 25 IU/L (ref 0–40)
Albumin/Globulin Ratio: 1.5 (ref 1.1–2.5)
Alkaline Phosphatase: 99 IU/L (ref 39–117)
BUN/Creatinine Ratio: 14 (ref 11–26)
BUN: 14 mg/dL (ref 8–27)
CALCIUM: 10.1 mg/dL (ref 8.7–10.3)
CHLORIDE: 96 mmol/L — AB (ref 97–108)
CO2: 23 mmol/L (ref 18–29)
Creatinine, Ser: 1.03 mg/dL — ABNORMAL HIGH (ref 0.57–1.00)
GFR calc Af Amer: 63 mL/min/{1.73_m2} (ref >59)
GFR calc non Af Amer: 54 mL/min/{1.73_m2} — ABNORMAL LOW (ref >59)
GLUCOSE: 181 mg/dL — AB (ref 65–99)
Globulin, Total: 3 g/dL (ref 1.5–4.5)
Potassium: 4.5 mmol/L (ref 3.5–5.2)
Sodium: 137 mmol/L (ref 134–144)
TOTAL PROTEIN: 7.5 g/dL (ref 6.0–8.5)
Total Bilirubin: 0.4 mg/dL (ref 0.0–1.2)

## 2014-02-26 LAB — NMR, LIPOPROFILE
Cholesterol: 134 mg/dL (ref 100–199)
HDL CHOLESTEROL BY NMR: 45 mg/dL (ref >39)
HDL Particle Number: 31.8 umol/L (ref >=30.5)
LDL PARTICLE NUMBER: 581 nmol/L (ref <1000)
LDL Size: 19.7 nm (ref >20.5)
LDLC SERPL CALC-MCNC: 50 mg/dL (ref 0–99)
LP-IR Score: 43 (ref <=45)
SMALL LDL PARTICLE NUMBER: 471 nmol/L (ref <=527)
Triglycerides by NMR: 193 mg/dL — ABNORMAL HIGH (ref 0–149)

## 2014-02-26 LAB — VITAMIN D 25 HYDROXY (VIT D DEFICIENCY, FRACTURES): VIT D 25 HYDROXY: 32.2 ng/mL (ref 30.0–100.0)

## 2014-03-01 ENCOUNTER — Telehealth: Payer: Self-pay | Admitting: Pharmacist

## 2014-03-01 NOTE — Telephone Encounter (Signed)
Serum creatinine, calcium, potassium, LDL - C, HDL, A1c and vitamin D are at goals or WNL.  Triglycerides elevated but better than 1 year ago.  Continue current medications.  Patient notified of results.

## 2014-03-15 ENCOUNTER — Ambulatory Visit (INDEPENDENT_AMBULATORY_CARE_PROVIDER_SITE_OTHER): Payer: Medicare HMO | Admitting: Family Medicine

## 2014-03-15 ENCOUNTER — Encounter: Payer: Self-pay | Admitting: Family Medicine

## 2014-03-15 ENCOUNTER — Other Ambulatory Visit: Payer: Self-pay | Admitting: Family Medicine

## 2014-03-15 VITALS — BP 156/75 | HR 66 | Temp 96.8°F | Ht 64.0 in | Wt 158.2 lb

## 2014-03-15 DIAGNOSIS — F411 Generalized anxiety disorder: Secondary | ICD-10-CM

## 2014-03-15 MED ORDER — HYDROXYZINE HCL 25 MG PO TABS: 25.0000 mg | ORAL_TABLET | Freq: Three times a day (TID) | ORAL | Status: DC | PRN

## 2014-03-15 MED ORDER — CITALOPRAM HYDROBROMIDE 20 MG PO TABS: 20.0000 mg | ORAL_TABLET | Freq: Every day | ORAL | Status: DC

## 2014-03-15 NOTE — Progress Notes (Signed)
   Subjective:    Patient ID: Meredith Anderson, female    DOB: [birth year], 73 y.o.   MRN: [id number]  HPI This 73 y.o. female presents for evaluation of hospital follow up.  She was seen a week ago at [facility] for chest pain and hypertension. She states she had sbp of 200 and was tx'd.  According to patient she was told everything for her heart was normal and she has anxiety.  Review of her chart shows she was seen for atypical chest pain and had negative CE's and negative lexiscan.  She had her diltiazem dc'd.  She has appointment with her cardiologist in the next few months.  According to her chart she had some GERD sx's and was tx'd with PPI.  She denies any chest pain but does have anxiety and feels tense all the time.  She has hx of Afib and is on coumadin.  Her INR was 1.9.   Review of Systems C/o anxiety  No chest pain, SOB, HA, dizziness, vision change, N/V, diarrhea, constipation, dysuria, urinary urgency or frequency, myalgias, arthralgias or rash.     Objective:   Physical Exam Vital signs noted  Well developed well nourished female.  HEENT - Head atraumatic Normocephalic                Eyes - PERRLA, Conjuctiva - clear Sclera- Clear EOMI                Ears - EAC's Wnl TM's Wnl Gross Hearing WNL                Nose - Nares patent                 Throat - oropharanx wnl Respiratory - Lungs CTA bilateral Cardiac - RRR S1 and S2 without murmur GI - Abdomen soft Nontender and bowel sounds active x 4 Extremities - No edema. Neuro - Grossly intact.       Assessment & Plan:  Anxiety state, unspecified - Plan: citalopram (CELEXA) 20 MG tablet, hydrOXYzine (ATARAX/VISTARIL) 25 MG tablet  Follow up in one months  Atrial Fibrillation - continue current coumadin regimen and follow up with [place] FNP

## 2014-03-16 NOTE — Telephone Encounter (Signed)
Atarax was refilled 03/15/14. Please refuse this RX. Last thyroid 1/15.

## 2014-03-18 ENCOUNTER — Telehealth: Payer: Self-pay | Admitting: *Deleted

## 2014-03-18 NOTE — Telephone Encounter (Signed)
Please find out whythey will  Not pay for meclizine

## 2014-03-18 NOTE — Telephone Encounter (Signed)
Pharmacy called and said that insurance will not cover hydroxyzine. Can med be changed to something else

## 2014-03-19 NOTE — Telephone Encounter (Signed)
Hydroxyzine just isn't on their formulary. Med only costs $12.34 out of pocket.  Tried to contact patient but she was unavailable.

## 2014-03-26 ENCOUNTER — Ambulatory Visit (INDEPENDENT_AMBULATORY_CARE_PROVIDER_SITE_OTHER): Payer: Medicare HMO | Admitting: Family Medicine

## 2014-03-26 ENCOUNTER — Encounter: Payer: Self-pay | Admitting: Family Medicine

## 2014-03-26 VITALS — BP 139/78 | HR 69 | Temp 97.7°F | Ht 64.0 in | Wt 158.0 lb

## 2014-03-26 DIAGNOSIS — Z7901 Long term (current) use of anticoagulants: Secondary | ICD-10-CM

## 2014-03-26 DIAGNOSIS — I4891 Unspecified atrial fibrillation: Secondary | ICD-10-CM

## 2014-03-26 DIAGNOSIS — S2249XA Multiple fractures of ribs, unspecified side, initial encounter for closed fracture: Secondary | ICD-10-CM

## 2014-03-26 LAB — POCT INR: INR: 2

## 2014-03-26 MED ORDER — HYDROCODONE-ACETAMINOPHEN 5-325 MG PO TABS: 1.0000 | ORAL_TABLET | Freq: Four times a day (QID) | ORAL | Status: DC | PRN

## 2014-03-26 NOTE — Progress Notes (Addendum)
   Subjective:    Patient ID: Meredith Anderson, female    DOB: [birth year], 73 y.o.   MRN: [id number]  HPI Wrong Patient Note and Error made.  New note for today.  Please disregard prior note.  Patient was seen in the ED and hospitalized at [facility] and she states she was put on IV medicine and  Her heart rate was reduced.  She was discharged yesterday.  She states her coumadin was subtherapeutic and was adjusted and she needs INR.  She is taking coumadin 3 mg po qd.   Review of Systems No chest pain, SOB, HA, dizziness, vision change, N/V, diarrhea, constipation, dysuria, urinary urgency or frequency, myalgias, arthralgias or rash.     Objective:   Physical Exam Vital signs noted  Well developed well nourished female.  HEENT - Head atraumatic Normocephalic Respiratory - Lungs CTA bilateral Cardiac - RRR S1 and S2 without murmur GI - Abdomen soft Nontender and bowel sounds active x 4 Extremities - No edema. Neuro - Grossly intact.  Results for orders placed in visit on 03/26/14  POCT INR      Result Value Ref Range   INR 2.0         Assessment & Plan:   Atrial fibrillation - Plan: POCT INR  Long term (current) use of anticoagulants - Plan: POCT INR  Lysbeth Penner FNP

## 2014-03-26 NOTE — Telephone Encounter (Signed)
Aware, she can get meclizine but must pay out of pocket because pharmacy says it was not on their formulary.

## 2014-03-26 NOTE — Addendum Note (Signed)
Addended by: Earlene Plater on: 03/26/2014 11:11 AM   Modules accepted: Orders

## 2014-03-26 NOTE — Progress Notes (Signed)
   Subjective:    Patient ID: Meredith Anderson, female    DOB: April 03, 1941, 73 y.o.   MRN: [id number]  HPI She fell 4 days ago and fractured several ribs on the right side. She is in severe pain.  She denies Any SOB or fever.   Review of Systems No chest pain, SOB, HA, dizziness, vision change, N/V, diarrhea, constipation, dysuria, urinary urgency or frequency, myalgias, arthralgias or rash.     Objective:   Physical Exam   Vital signs noted  Well developed well nourished female.  HEENT - Head atraumatic Normocephalic                Eyes - PERRLA, Conjuctiva - clear Sclera- Clear EOMI                Ears - EAC's Wnl TM's Wnl Gross Hearing WNL                Throat - oropharanx wnl Respiratory - Lungs CTA bilateral Cardiac - RRR S1 and S2 without murmur GI - Abdomen soft Nontender and bowel sounds active x 4 MS - TTP right lateral chest wall.  No paradoxical movement and no sq emphysema.     Assessment & Plan:  Fracture five ribs-closed, right, initial encounter Norco 5mg  one to two po qid prn pain #45. Toradol 30 mg IM Discussed splinting right chest wall with pillow to TCDB. Incentive Spirometer Rx given.  Lysbeth Penner FNP

## 2014-03-26 NOTE — Patient Instructions (Signed)
Anticoagulation Dose Instructions as of 03/26/2014     Meredith Anderson Tue Wed Thu Fri Sat   New Dose 3 mg 3 mg 3 mg 3 mg 3 mg 3 mg 3 mg    Description       Continue current regimen and follow up in 2 weeks

## 2014-03-31 ENCOUNTER — Other Ambulatory Visit: Payer: Self-pay | Admitting: Family Medicine

## 2014-04-01 ENCOUNTER — Ambulatory Visit (INDEPENDENT_AMBULATORY_CARE_PROVIDER_SITE_OTHER): Payer: Medicare HMO | Admitting: Pharmacist

## 2014-04-01 DIAGNOSIS — Z7901 Long term (current) use of anticoagulants: Secondary | ICD-10-CM

## 2014-04-01 DIAGNOSIS — I4891 Unspecified atrial fibrillation: Secondary | ICD-10-CM

## 2014-04-01 LAB — POCT INR: INR: 3.2

## 2014-04-01 NOTE — Patient Instructions (Signed)
Anticoagulation Dose Instructions as of 04/01/2014     Meredith Anderson Tue Wed Thu Fri Sat   New Dose 3 mg 3 mg 3 mg 3 mg 1.5 mg 3 mg 3 mg    Description       No warfarin today - Thursdays, October 1st, then start new dose on 1/2 tablet (=1.5mg ) every Thursday and 1 tablet (=3mg ) all other days      INR was 3.2 today

## 2014-04-02 ENCOUNTER — Telehealth: Payer: Self-pay | Admitting: Family Medicine

## 2014-04-02 LAB — BMP8+EGFR
BUN/Creatinine Ratio: 17 (ref 11–26)
BUN: 16 mg/dL (ref 8–27)
CO2: 27 mmol/L (ref 18–29)
Calcium: 9.8 mg/dL (ref 8.7–10.3)
Chloride: 98 mmol/L (ref 97–108)
Creatinine, Ser: 0.96 mg/dL (ref 0.57–1.00)
GFR calc Af Amer: 68 mL/min/{1.73_m2} (ref >59)
GFR, EST NON AFRICAN AMERICAN: 59 mL/min/{1.73_m2} — AB (ref >59)
Glucose: 98 mg/dL (ref 65–99)
POTASSIUM: 4.2 mmol/L (ref 3.5–5.2)
SODIUM: 140 mmol/L (ref 134–144)

## 2014-04-02 LAB — MAGNESIUM: Magnesium: 2.1 mg/dL (ref 1.6–2.6)

## 2014-04-03 ENCOUNTER — Encounter: Payer: Self-pay | Admitting: *Deleted

## 2014-04-04 ENCOUNTER — Other Ambulatory Visit: Payer: Self-pay | Admitting: Family Medicine

## 2014-04-12 ENCOUNTER — Encounter: Payer: Self-pay | Admitting: Family

## 2014-04-12 ENCOUNTER — Telehealth: Payer: Self-pay | Admitting: Family Medicine

## 2014-04-12 ENCOUNTER — Ambulatory Visit (INDEPENDENT_AMBULATORY_CARE_PROVIDER_SITE_OTHER): Payer: Medicare HMO | Admitting: Family

## 2014-04-12 VITALS — BP 161/81 | HR 70 | Temp 99.3°F | Ht 64.0 in | Wt 161.0 lb

## 2014-04-12 DIAGNOSIS — J029 Acute pharyngitis, unspecified: Secondary | ICD-10-CM

## 2014-04-12 DIAGNOSIS — J069 Acute upper respiratory infection, unspecified: Secondary | ICD-10-CM

## 2014-04-12 LAB — POCT RAPID STREP A (OFFICE): Rapid Strep A Screen: NEGATIVE

## 2014-04-12 MED ORDER — AMOXICILLIN 500 MG PO CAPS: 500.0000 mg | ORAL_CAPSULE | Freq: Two times a day (BID) | ORAL | Status: DC

## 2014-04-12 NOTE — Patient Instructions (Addendum)
Upper Respiratory Infection, Adult An upper respiratory infection (URI) is also known as the common cold. It is often caused by a type of germ (virus). Colds are easily spread (contagious). You can pass it to others by kissing, coughing, sneezing, or drinking out of the same glass. Usually, you get better in 1 or 2 weeks.  HOME CARE   Only take medicine as told by your doctor.  Use a warm mist humidifier or breathe in steam from a hot shower.  Drink enough water and fluids to keep your pee (urine) clear or pale yellow.  Get plenty of rest.  Return to work when your temperature is back to normal or as told by your doctor. You may use a face mask and wash your hands to stop your cold from spreading. GET HELP RIGHT AWAY IF:   After the first few days, you feel you are getting worse.  You have questions about your medicine.  You have chills, shortness of breath, or brown or red spit (mucus).  You have yellow or brown snot (nasal discharge) or pain in the face, especially when you bend forward.  You have a fever, puffy (swollen) neck, pain when you swallow, or white spots in the back of your throat.  You have a bad headache, ear pain, sinus pain, or chest pain.  You have a high-pitched whistling sound when you breathe in and out (wheezing).  You have a lasting cough or cough up blood.  You have sore muscles or a stiff neck. MAKE SURE YOU:   Understand these instructions.  Will watch your condition.  Will get help right away if you are not doing well or get worse. Document Released: 12/05/2007 Document Revised: 09/10/2011 Document Reviewed: 09/23/2013 [facility] Patient Information 2015 [place], [place]. This information is not intended to replace advice given to you by your health care provider. Make sure you discuss any questions you have with your health care provider.  - Take meds as prescribed - Use a cool mist humidifier  -Use saline nose sprays frequently -Saline  irrigations of the nose can be very helpful if done frequently.  * 4X daily for 1 week*  * Use of a nettie pot can be helpful with this. Follow directions with this* -Force fluids -For any cough or congestion  Use plain Mucinex- regular strength or max strength is fine   * Children- consult with Pharmacist for dosing -For fever or aces or pains- take tylenol or ibuprofen appropriate for age and weight.  * for fevers greater than 101 orally you may alternate ibuprofen and tylenol every  3 hours. -Throat lozenges if help  Evelina Dun, FNP

## 2014-04-12 NOTE — Progress Notes (Signed)
Subjective:    Patient ID: Meredith Anderson, female    DOB: 24-Jan-1941, 73 y.o.   MRN: [id number]  Otalgia  There is pain in both ears. This is a new problem. The current episode started yesterday. The problem occurs constantly. The problem has been waxing and waning. The maximum temperature recorded prior to her arrival was 100 - 100.9 F. The patient is experiencing no pain. Associated symptoms include coughing, headaches, rhinorrhea and a sore throat. Pertinent negatives include no drainage. Treatments tried: benadryl. The treatment provided mild relief.  Sore Throat  This is a new problem. The current episode started yesterday. The problem has been waxing and waning. The maximum temperature recorded prior to her arrival was 100 - 100.9 F. The patient is experiencing no pain. Associated symptoms include congestion, coughing, ear pain, headaches, a hoarse voice and a plugged ear sensation. Pertinent negatives include no shortness of breath or trouble swallowing. Treatments tried: Benadryl. The treatment provided mild relief.      Review of Systems  Constitutional: Negative.   HENT: Positive for congestion, ear pain, hoarse voice, rhinorrhea and sore throat. Negative for trouble swallowing.   Eyes: Negative.   Respiratory: Positive for cough. Negative for shortness of breath.   Cardiovascular: Negative.  Negative for palpitations.  Gastrointestinal: Negative.   Endocrine: Negative.   Genitourinary: Negative.   Musculoskeletal: Negative.   Neurological: Positive for headaches.  Hematological: Negative.   Psychiatric/Behavioral: Negative.   All other systems reviewed and are negative.      Objective:   Physical Exam  Vitals reviewed. Constitutional: She is oriented to person, place, and time. She appears well-developed and well-nourished. No distress.  HENT:  Head: Normocephalic and atraumatic.  Right Ear: External ear normal.  Left Ear: External ear normal.  Nasal passage erythemas  with mild swelling Oropharynx erythemas  Eyes: Pupils are equal, round, and reactive to light.  Neck: Normal range of motion. Neck supple. No thyromegaly present.  Cardiovascular: Normal rate, regular rhythm, normal heart sounds and intact distal pulses.   No murmur heard. Pulmonary/Chest: Effort normal and breath sounds normal. No respiratory distress. She has no wheezes.  Abdominal: Soft. Bowel sounds are normal. She exhibits no distension. There is no tenderness.  Musculoskeletal: Normal range of motion. She exhibits no edema and no tenderness.  Neurological: She is alert and oriented to person, place, and time. She has normal reflexes. No cranial nerve deficit.  Skin: Skin is warm and dry.  Psychiatric: She has a normal mood and affect. Her behavior is normal. Judgment and thought content normal.      BP 161/81  Pulse 70  Temp(Src) 99.3 F (37.4 C) (Oral)  Ht 5\' 4"  (1.626 m)  Wt 161 lb (73.029 kg)  BMI 27.62 kg/m2     Assessment & Plan:  1. Sore throat - POCT rapid strep A  2. URI, acute - Take meds as prescribed - Use a cool mist humidifier  -Use saline nose sprays frequently -Saline irrigations of the nose can be very helpful if done frequently.  * 4X daily for 1 week*  * Use of a nettie pot can be helpful with this. Follow directions with this* -Force fluids -For any cough or congestion  Use plain Mucinex- regular strength or max strength is fine   * Children- consult with Pharmacist for dosing -For fever or aces or pains- take tylenol or ibuprofen appropriate for age and weight.  * for fevers greater than 101 orally you may alternate ibuprofen  and tylenol every  3 hours. -Throat lozenges if help - amoxicillin (AMOXIL) 500 MG capsule; Take 1 capsule (500 mg total) by mouth 2 (two) times daily.  Dispense: 14 capsule; Refill: 0  Evelina Dun, FNP

## 2014-04-12 NOTE — Telephone Encounter (Signed)
Appt given for today 

## 2014-04-13 ENCOUNTER — Encounter: Payer: Self-pay | Admitting: Gastroenterology

## 2014-04-14 ENCOUNTER — Ambulatory Visit: Payer: Medicare HMO | Admitting: Family Medicine

## 2014-04-19 DIAGNOSIS — M542 Cervicalgia: Secondary | ICD-10-CM | POA: Insufficient documentation

## 2014-04-26 ENCOUNTER — Ambulatory Visit (INDEPENDENT_AMBULATORY_CARE_PROVIDER_SITE_OTHER): Payer: Medicare HMO | Admitting: Nurse Practitioner

## 2014-04-26 ENCOUNTER — Encounter: Payer: Self-pay | Admitting: Nurse Practitioner

## 2014-04-26 VITALS — BP 121/71 | HR 62 | Temp 97.4°F | Ht 64.0 in | Wt 160.8 lb

## 2014-04-26 DIAGNOSIS — I482 Chronic atrial fibrillation, unspecified: Secondary | ICD-10-CM

## 2014-04-26 DIAGNOSIS — I4891 Unspecified atrial fibrillation: Secondary | ICD-10-CM

## 2014-04-26 DIAGNOSIS — S161XXA Strain of muscle, fascia and tendon at neck level, initial encounter: Secondary | ICD-10-CM

## 2014-04-26 DIAGNOSIS — Z7901 Long term (current) use of anticoagulants: Secondary | ICD-10-CM

## 2014-04-26 LAB — POCT INR: INR: 4

## 2014-04-26 MED ORDER — CYCLOBENZAPRINE HCL 5 MG PO TABS: 5.0000 mg | ORAL_TABLET | Freq: Three times a day (TID) | ORAL | Status: DC | PRN

## 2014-04-26 NOTE — Progress Notes (Signed)
   Subjective:    Patient ID: Meredith Anderson, female    DOB: 01-Oct-1940, 73 y.o.   MRN: [id number]  HPI Patient is here today complaining of neck pain that started a couple of weeks ago and it is gradually worsen. She has taken tramadol and tylenol and bengay without relief. Patient rate her pain a 4/10. The pain is constant, there are no aggravating factors, she reports the pain radiate to her left arm once in a while. She denies any headaches or fever, denies any recent trauma.    Review of Systems  Musculoskeletal: Positive for neck pain and neck stiffness (unable to turn her head to the left. ).  All other systems reviewed and are negative.      Objective:   Physical Exam  Constitutional: She is oriented to person, place, and time. She appears well-developed and well-nourished.  HENT:  Head: Normocephalic.  Eyes: Pupils are equal, round, and reactive to light.  Neck: Normal range of motion.  Cardiovascular: Normal rate.   Pulmonary/Chest: Effort normal.  Musculoskeletal: She exhibits tenderness (neck pain. ).  Decrease ROM of cervical spine due to pain with extension and rotation to the left Grips equal bil Motor strength and sensation bil upper ext intact and equla  Neurological: She is alert and oriented to person, place, and time. She has normal reflexes.  Skin: Skin is warm.  Psychiatric: She has a normal mood and affect. Her behavior is normal. Judgment and thought content normal.    BP 121/71  Pulse 62  Temp(Src) 97.4 F (36.3 C) (Oral)  Ht 5\' 4"  (1.626 m)  Wt 160 lb 12.8 oz (72.938 kg)  BMI 27.59 kg/m2       Assessment & Plan:  1. Long term current use of anticoagulant therapy - POCT INR  2. Chronic atrial fibrillation Take coumadin as instructed and recheck in 1 week  3. Strain of neck muscle, initial encounter Moist heat Continue tylenol OTC RTO prn - cyclobenzaprine (FLEXERIL) 5 MG tablet; Take 1 tablet (5 mg total) by mouth 3 (three) times daily as  needed for muscle spasms.  Dispense: 30 tablet; Refill: [place], FNP

## 2014-04-26 NOTE — Patient Instructions (Addendum)
  Anticoagulation Dose Instructions as of 04/26/2014     Meredith Anderson Tue Wed Thu Fri Sat   New Dose 4 mg 0 mg 2 mg 4 mg 2 mg 4 mg 2 mg    Description       Hold today- alt 2mg  and 4mg  daily- recheck in 1 week     Recheck in 1 week        Muscle Strain A muscle strain is an injury that occurs when a muscle is stretched beyond its normal length. Usually a small number of muscle fibers are torn when this happens. Muscle strain is rated in degrees. First-degree strains have the least amount of muscle fiber tearing and pain. Second-degree and third-degree strains have increasingly more tearing and pain.  Usually, recovery from muscle strain takes 1-2 weeks. Complete healing takes 5-6 weeks.  CAUSES  Muscle strain happens when a sudden, violent force placed on a muscle stretches it too far. This may occur with lifting, sports, or a fall.  RISK FACTORS Muscle strain is especially common in athletes.  SIGNS AND SYMPTOMS At the site of the muscle strain, there may be:  Pain.  Bruising.  Swelling.  Difficulty using the muscle due to pain or lack of normal function. DIAGNOSIS  Your health care provider will perform a physical exam and ask about your medical history. TREATMENT  Often, the best treatment for a muscle strain is resting, icing, and applying cold compresses to the injured area.  HOME CARE INSTRUCTIONS   Use the PRICE method of treatment to promote muscle healing during the first 2-3 days after your injury. The PRICE method involves:  Protecting the muscle from being injured again.  Restricting your activity and resting the injured body part.  Icing your injury. To do this, put ice in a plastic bag. Place a towel between your skin and the bag. Then, apply the ice and leave it on from 15-20 minutes each hour. After the third day, switch to moist heat packs.  Apply compression to the injured area with a splint or elastic bandage. Be careful not to wrap it too tightly.  This may interfere with blood circulation or increase swelling.  Elevate the injured body part above the level of your heart as often as you can.  Only take over-the-counter or prescription medicines for pain, discomfort, or fever as directed by your health care provider.  Warming up prior to exercise helps to prevent future muscle strains. SEEK MEDICAL CARE IF:   You have increasing pain or swelling in the injured area.  You have numbness, tingling, or a significant loss of strength in the injured area. MAKE SURE YOU:   Understand these instructions.  Will watch your condition.  Will get help right away if you are not doing well or get worse. Document Released: 06/18/2005 Document Revised: 04/08/2013 Document Reviewed: 01/15/2013 [facility] Patient Information 2015 [place], [place]. This information is not intended to replace advice given to you by your health care provider. Make sure you discuss any questions you have with your health care provider.

## 2014-04-29 ENCOUNTER — Telehealth: Payer: Self-pay | Admitting: Nurse Practitioner

## 2014-04-29 NOTE — Telephone Encounter (Signed)
Pt notified of MMM recommendations Verbalizes understanding

## 2014-04-29 NOTE — Telephone Encounter (Signed)
Really od not want to do anything stronger than tramadol due to her age.

## 2014-05-03 ENCOUNTER — Ambulatory Visit (INDEPENDENT_AMBULATORY_CARE_PROVIDER_SITE_OTHER): Payer: Medicare HMO | Admitting: Pharmacist

## 2014-05-03 DIAGNOSIS — Z7901 Long term (current) use of anticoagulants: Secondary | ICD-10-CM

## 2014-05-03 DIAGNOSIS — I482 Chronic atrial fibrillation, unspecified: Secondary | ICD-10-CM

## 2014-05-03 DIAGNOSIS — I4891 Unspecified atrial fibrillation: Secondary | ICD-10-CM

## 2014-05-03 LAB — POCT INR: INR: 2.1

## 2014-05-03 MED ORDER — DICLOFENAC SODIUM 1 % TD GEL: TRANSDERMAL | Status: DC

## 2014-05-03 MED ORDER — DILTIAZEM HCL ER COATED BEADS 120 MG PO CP24: 120.0000 mg | ORAL_CAPSULE | Freq: Every day | ORAL | Status: DC

## 2014-05-03 NOTE — Patient Instructions (Signed)
Anticoagulation Dose Instructions as of 05/03/2014      Meredith Anderson Tue Wed Thu Fri Sat   New Dose 2 mg 4 mg 2 mg 2 mg 2 mg 4 mg 2 mg    Description        Change to 4mg  or 2 tablets on mondays and fridays and 2mg  or 1 tablet all other days     INR was 2.1 today

## 2014-05-03 NOTE — Progress Notes (Signed)
Patient continues to c/o of neck pain that is not relieved with tramadol or cyclobenzeprine.  Discussed with PCP Chevis Pretty, NP.   Suggested trial of either Lidoderm patches or volteran gel Volteran gel - apply 1 to 2 grams to area of pain up to qid

## 2014-05-10 ENCOUNTER — Other Ambulatory Visit: Payer: Self-pay | Admitting: Family Medicine

## 2014-05-11 NOTE — Telephone Encounter (Signed)
Last seen 04/26/14 MMM  

## 2014-05-17 ENCOUNTER — Other Ambulatory Visit: Payer: Self-pay | Admitting: Pharmacist

## 2014-05-17 ENCOUNTER — Ambulatory Visit (INDEPENDENT_AMBULATORY_CARE_PROVIDER_SITE_OTHER): Payer: Medicare HMO | Admitting: Pharmacist

## 2014-05-17 ENCOUNTER — Encounter: Payer: Self-pay | Admitting: Pharmacist

## 2014-05-17 VITALS — BP 142/80 | HR 82 | Ht 64.0 in | Wt 164.5 lb

## 2014-05-17 DIAGNOSIS — I4891 Unspecified atrial fibrillation: Secondary | ICD-10-CM

## 2014-05-17 DIAGNOSIS — E119 Type 2 diabetes mellitus without complications: Secondary | ICD-10-CM

## 2014-05-17 DIAGNOSIS — Z13820 Encounter for screening for osteoporosis: Secondary | ICD-10-CM

## 2014-05-17 DIAGNOSIS — Z0000 Encounter for general adult medical examination without abnormal findings: Secondary | ICD-10-CM

## 2014-05-17 DIAGNOSIS — Z23 Encounter for immunization: Secondary | ICD-10-CM

## 2014-05-17 DIAGNOSIS — Z7901 Long term (current) use of anticoagulants: Secondary | ICD-10-CM

## 2014-05-17 LAB — POCT INR: INR: 2

## 2014-05-17 MED ORDER — WARFARIN SODIUM 2 MG PO TABS: 2.0000 mg | ORAL_TABLET | Freq: Every day | ORAL | Status: DC

## 2014-05-17 MED ORDER — ROSUVASTATIN CALCIUM 20 MG PO TABS: 20.0000 mg | ORAL_TABLET | Freq: Every day | ORAL | Status: DC

## 2014-05-17 NOTE — Addendum Note (Signed)
Addended by: Cherre Robins on: 05/17/2014 04:05 PM   Modules accepted: Level of Service

## 2014-05-17 NOTE — Patient Instructions (Addendum)
Anticoagulation Dose Instructions as of 05/17/2014      Meredith Anderson Tue Wed Thu Fri Sat   New Dose 2 mg 4 mg 2 mg 2 mg 2 mg 4 mg 2 mg    Description        Continue same warfarin dose of 42m or 2 tablets on mondays and fridays and 25mor 1 tablet all other days     INR was 2.0 today   Health Maintenance Summary     FOOT EXAM Done today      OPHTHALMOLOGY EXAM Overdue 06/24/1951 Make appt for Dr MaHassell Done  URINE MICROALBUMIN Done today      MAMMOGRAM Next Due 07/25/2014 Last done 07/25/2012    HEMOGLOBIN A1C Next Due 08/28/2014 Last done 02/25/2014    INFLUENZA VACCINE Next Due 01/31/2015 Last done 03/15/2014    COLONOSCOPY Next Due 11/01/2019 Last done 10/31/2009   Dexa / Bone density  Referral made today     Pneumonia Vaccine Completed     Zostavax / Shingles vaccine Completed      TETANUS/TDAP Next Due 01/30/2022 Last done 01/31/2012      Preventive Care for Adults A healthy lifestyle and preventive care can promote health and wellness. Preventive health guidelines for women include the following key practices.  A routine yearly physical is a good way to check with your health care provider about your health and preventive screening. It is a chance to share any concerns and updates on your health and to receive a thorough exam.  Visit your dentist for a routine exam and preventive care every 6 months. Brush your teeth twice a day and floss once a day. Good oral hygiene prevents tooth decay and gum disease.  The frequency of eye exams is based on your age, health, family medical history, use of contact lenses, and other factors. Follow your health care provider's recommendations for frequency of eye exams.  Eat a healthy diet. Foods like vegetables, fruits, whole grains, low-fat dairy products, and lean protein foods contain the nutrients you need without too many calories. Decrease your intake of foods high in solid fats, added sugars, and salt. Eat the right amount of calories for you.Get  information about a proper diet from your health care provider, if necessary.  Regular physical exercise is one of the most important things you can do for your health. Most adults should get at least 150 minutes of moderate-intensity exercise (any activity that increases your heart rate and causes you to sweat) each week. In addition, most adults need muscle-strengthening exercises on 2 or more days a week.  Maintain a healthy weight. The body mass index (BMI) is a screening tool to identify possible weight problems. It provides an estimate of body fat based on height and weight. Your health care provider can find your BMI and can help you achieve or maintain a healthy weight.For adults 20 years and older:  A BMI below 18.5 is considered underweight.  A BMI of 18.5 to 24.9 is normal.  A BMI of 25 to 29.9 is considered overweight.  A BMI of 30 and above is considered obese.  Maintain normal blood lipids and cholesterol levels by exercising and minimizing your intake of saturated fat. Eat a balanced diet with plenty of fruit and vegetables. Blood tests for lipids and cholesterol should begin at age 855nd be repeated every 5 years. If your lipid or cholesterol levels are high, you are over 50, or you are at high risk  for heart disease, you may need your cholesterol levels checked more frequently.Ongoing high lipid and cholesterol levels should be treated with medicines if diet and exercise are not working.  If you smoke, find out from your health care provider how to quit. If you do not use tobacco, do not start.  Lung cancer screening is recommended for adults aged 57-80 years who are at high risk for developing lung cancer because of a history of smoking. A yearly low-dose CT scan of the lungs is recommended for people who have at least a 30-pack-year history of smoking and are a current smoker or have quit within the past 15 years. A pack year of smoking is smoking an average of 1 pack of  cigarettes a day for 1 year (for example: 1 pack a day for 30 years or 2 packs a day for 15 years). Yearly screening should continue until the smoker has stopped smoking for at least 15 years. Yearly screening should be stopped for people who develop a health problem that would prevent them from having lung cancer treatment.  If you are pregnant, do not drink alcohol. If you are breastfeeding, be very cautious about drinking alcohol. If you are not pregnant and choose to drink alcohol, do not have more than 1 drink per day. One drink is considered to be 12 ounces (355 mL) of beer, 5 ounces (148 mL) of wine, or 1.5 ounces (44 mL) of liquor.  Avoid use of street drugs. Do not share needles with anyone. Ask for help if you need support or instructions about stopping the use of drugs.  High blood pressure causes heart disease and increases the risk of stroke. Your blood pressure should be checked at least every 1 to 2 years. Ongoing high blood pressure should be treated with medicines if weight loss and exercise do not work.  If you are 60-37 years old, ask your health care provider if you should take aspirin to prevent strokes.  Diabetes screening involves taking a blood sample to check your fasting blood sugar level. This should be done once every 3 years, after age 62, if you are within normal weight and without risk factors for diabetes. Testing should be considered at a younger age or be carried out more frequently if you are overweight and have at least 1 risk factor for diabetes.  Breast cancer screening is essential preventive care for women. You should practice "breast self-awareness." This means understanding the normal appearance and feel of your breasts and may include breast self-examination. Any changes detected, no matter how small, should be reported to a health care provider. Women in their 53s and 30s should have a clinical breast exam (CBE) by a health care provider as part of a regular  health exam every 1 to 3 years. After age 21, women should have a CBE every year. Starting at age 79, women should consider having a mammogram (breast X-ray test) every year. Women who have a family history of breast cancer should talk to their health care provider about genetic screening. Women at a high risk of breast cancer should talk to their health care providers about having an MRI and a mammogram every year.  Breast cancer gene (BRCA)-related cancer risk assessment is recommended for women who have family members with BRCA-related cancers. BRCA-related cancers include breast, ovarian, tubal, and peritoneal cancers. Having family members with these cancers may be associated with an increased risk for harmful changes (mutations) in the breast cancer genes BRCA1 and  BRCA2. Results of the assessment will determine the need for genetic counseling and BRCA1 and BRCA2 testing.  Routine pelvic exams to screen for cancer are no longer recommended for nonpregnant women who are considered low risk for cancer of the pelvic organs (ovaries, uterus, and vagina) and who do not have symptoms. Ask your health care provider if a screening pelvic exam is right for you.  If you have had past treatment for cervical cancer or a condition that could lead to cancer, you need Pap tests and screening for cancer for at least 20 years after your treatment. If Pap tests have been discontinued, your risk factors (such as having a new sexual partner) need to be reassessed to determine if screening should be resumed. Some women have medical problems that increase the chance of getting cervical cancer. In these cases, your health care provider may recommend more frequent screening and Pap tests.  The HPV test is an additional test that may be used for cervical cancer screening. The HPV test looks for the virus that can cause the cell changes on the cervix. The cells collected during the Pap test can be tested for HPV. The HPV test  could be used to screen women aged 37 years and older, and should be used in women of any age who have unclear Pap test results. After the age of 10, women should have HPV testing at the same frequency as a Pap test.  Colorectal cancer can be detected and often prevented. Most routine colorectal cancer screening begins at the age of 37 years and continues through age 21 years. However, your health care provider may recommend screening at an earlier age if you have risk factors for colon cancer. On a yearly basis, your health care provider may provide home test kits to check for hidden blood in the stool. Use of a small camera at the end of a tube, to directly examine the colon (sigmoidoscopy or colonoscopy), can detect the earliest forms of colorectal cancer. Talk to your health care provider about this at age 49, when routine screening begins. Direct exam of the colon should be repeated every 5-10 years through age 48 years, unless early forms of pre-cancerous polyps or small growths are found.  People who are at an increased risk for hepatitis B should be screened for this virus. You are considered at high risk for hepatitis B if:  You were born in a country where hepatitis B occurs often. Talk with your health care provider about which countries are considered high risk.  Your parents were born in a high-risk country and you have not received a shot to protect against hepatitis B (hepatitis B vaccine).  You have HIV or AIDS.  You use needles to inject street drugs.  You live with, or have sex with, someone who has hepatitis B.  You get hemodialysis treatment.  You take certain medicines for conditions like cancer, organ transplantation, and autoimmune conditions.  Hepatitis C blood testing is recommended for all people born from [birth year] through 1965 and any individual with known risks for hepatitis C.  Practice safe sex. Use condoms and avoid high-risk sexual practices to reduce the spread of  sexually transmitted infections (STIs). STIs include gonorrhea, chlamydia, syphilis, trichomonas, herpes, HPV, and human immunodeficiency virus (HIV). Herpes, HIV, and HPV are viral illnesses that have no cure. They can result in disability, cancer, and death.  You should be screened for sexually transmitted illnesses (STIs) including gonorrhea and chlamydia if:  You  are sexually active and are younger than 24 years.  You are older than 24 years and your health care provider tells you that you are at risk for this type of infection.  Your sexual activity has changed since you were last screened and you are at an increased risk for chlamydia or gonorrhea. Ask your health care provider if you are at risk.  If you are at risk of being infected with HIV, it is recommended that you take a prescription medicine daily to prevent HIV infection. This is called preexposure prophylaxis (PrEP). You are considered at risk if:  You are a heterosexual woman, are sexually active, and are at increased risk for HIV infection.  You take drugs by injection.  You are sexually active with a partner who has HIV.  Talk with your health care provider about whether you are at high risk of being infected with HIV. If you choose to begin PrEP, you should first be tested for HIV. You should then be tested every 3 months for as long as you are taking PrEP.  Osteoporosis is a disease in which the bones lose minerals and strength with aging. This can result in serious bone fractures or breaks. The risk of osteoporosis can be identified using a bone density scan. Women ages 34 years and over and women at risk for fractures or osteoporosis should discuss screening with their health care providers. Ask your health care provider whether you should take a calcium supplement or vitamin D to reduce the rate of osteoporosis.  Menopause can be associated with physical symptoms and risks. Hormone replacement therapy is available to  decrease symptoms and risks. You should talk to your health care provider about whether hormone replacement therapy is right for you.  Use sunscreen. Apply sunscreen liberally and repeatedly throughout the day. You should seek shade when your shadow is shorter than you. Protect yourself by wearing long sleeves, pants, a wide-brimmed hat, and sunglasses year round, whenever you are outdoors.  Once a month, do a whole body skin exam, using a mirror to look at the skin on your back. Tell your health care provider of new moles, moles that have irregular borders, moles that are larger than a pencil eraser, or moles that have changed in shape or color.  Stay current with required vaccines (immunizations).  Influenza vaccine. All adults should be immunized every year.  Tetanus, diphtheria, and acellular pertussis (Td, Tdap) vaccine. Pregnant women should receive 1 dose of Tdap vaccine during each pregnancy. The dose should be obtained regardless of the length of time since the last dose. Immunization is preferred during the 27th-36th week of gestation. An adult who has not previously received Tdap or who does not know her vaccine status should receive 1 dose of Tdap. This initial dose should be followed by tetanus and diphtheria toxoids (Td) booster doses every 10 years. Adults with an unknown or incomplete history of completing a 3-dose immunization series with Td-containing vaccines should begin or complete a primary immunization series including a Tdap dose. Adults should receive a Td booster every 10 years.  Varicella vaccine. An adult without evidence of immunity to varicella should receive 2 doses or a second dose if she has previously received 1 dose. Pregnant females who do not have evidence of immunity should receive the first dose after pregnancy. This first dose should be obtained before leaving the health care facility. The second dose should be obtained 4-8 weeks after the first dose.  Human  papillomavirus (  HPV) vaccine. Females aged 13-26 years who have not received the vaccine previously should obtain the 3-dose series. The vaccine is not recommended for use in pregnant females. However, pregnancy testing is not needed before receiving a dose. If a female is found to be pregnant after receiving a dose, no treatment is needed. In that case, the remaining doses should be delayed until after the pregnancy. Immunization is recommended for any person with an immunocompromised condition through the age of 74 years if she did not get any or all doses earlier. During the 3-dose series, the second dose should be obtained 4-8 weeks after the first dose. The third dose should be obtained 24 weeks after the first dose and 16 weeks after the second dose.  Zoster vaccine. One dose is recommended for adults aged 66 years or older unless certain conditions are present.  Measles, mumps, and rubella (MMR) vaccine. Adults born before [birth year] generally are considered immune to measles and mumps. Adults born in [birth year] or later should have 1 or more doses of MMR vaccine unless there is a contraindication to the vaccine or there is laboratory evidence of immunity to each of the three diseases. A routine second dose of MMR vaccine should be obtained at least 28 days after the first dose for students attending postsecondary schools, health care workers, or international travelers. People who received inactivated measles vaccine or an unknown type of measles vaccine during 1963-1967 should receive 2 doses of MMR vaccine. People who received inactivated mumps vaccine or an unknown type of mumps vaccine before 1979 and are at high risk for mumps infection should consider immunization with 2 doses of MMR vaccine. For females of childbearing age, rubella immunity should be determined. If there is no evidence of immunity, females who are not pregnant should be vaccinated. If there is no evidence of immunity, females who are pregnant  should delay immunization until after pregnancy. Unvaccinated health care workers born before [birth year] who lack laboratory evidence of measles, mumps, or rubella immunity or laboratory confirmation of disease should consider measles and mumps immunization with 2 doses of MMR vaccine or rubella immunization with 1 dose of MMR vaccine.  Pneumococcal 13-valent conjugate (PCV13) vaccine. When indicated, a person who is uncertain of her immunization history and has no record of immunization should receive the PCV13 vaccine. An adult aged 74 years or older who has certain medical conditions and has not been previously immunized should receive 1 dose of PCV13 vaccine. This PCV13 should be followed with a dose of pneumococcal polysaccharide (PPSV23) vaccine. The PPSV23 vaccine dose should be obtained at least 8 weeks after the dose of PCV13 vaccine. An adult aged 77 years or older who has certain medical conditions and previously received 1 or more doses of PPSV23 vaccine should receive 1 dose of PCV13. The PCV13 vaccine dose should be obtained 1 or more years after the last PPSV23 vaccine dose.  Pneumococcal polysaccharide (PPSV23) vaccine. When PCV13 is also indicated, PCV13 should be obtained first. All adults aged 97 years and older should be immunized. An adult younger than age 81 years who has certain medical conditions should be immunized. Any person who resides in a nursing home or long-term care facility should be immunized. An adult smoker should be immunized. People with an immunocompromised condition and certain other conditions should receive both PCV13 and PPSV23 vaccines. People with human immunodeficiency virus (HIV) infection should be immunized as soon as possible after diagnosis. Immunization during chemotherapy or radiation therapy should be  avoided. Routine use of PPSV23 vaccine is not recommended for American Indians, [place] Natives, or people younger than 65 years unless there are medical conditions  that require PPSV23 vaccine. When indicated, people who have unknown immunization and have no record of immunization should receive PPSV23 vaccine. One-time revaccination 5 years after the first dose of PPSV23 is recommended for people aged 19-64 years who have chronic kidney failure, nephrotic syndrome, asplenia, or immunocompromised conditions. People who received 1-2 doses of PPSV23 before age 98 years should receive another dose of PPSV23 vaccine at age 91 years or later if at least 5 years have passed since the previous dose. Doses of PPSV23 are not needed for people immunized with PPSV23 at or after age 66 years.  Meningococcal vaccine. Adults with asplenia or persistent complement component deficiencies should receive 2 doses of quadrivalent meningococcal conjugate (MenACWY-D) vaccine. The doses should be obtained at least 2 months apart. Microbiologists working with certain meningococcal bacteria, [place] recruits, people at risk during an outbreak, and people who travel to or live in countries with a high rate of meningitis should be immunized. A first-year college student up through age 39 years who is living in a residence hall should receive a dose if she did not receive a dose on or after her 16th birthday. Adults who have certain high-risk conditions should receive one or more doses of vaccine.  Hepatitis A vaccine. Adults who wish to be protected from this disease, have certain high-risk conditions, work with hepatitis A-infected animals, work in hepatitis A research labs, or travel to or work in countries with a high rate of hepatitis A should be immunized. Adults who were previously unvaccinated and who anticipate close contact with an international adoptee during the first 60 days after arrival in the [place] from a country with a high rate of hepatitis A should be immunized.  Hepatitis B vaccine. Adults who wish to be protected from this disease, have certain high-risk conditions, may  be exposed to blood or other infectious body fluids, are household contacts or sex partners of hepatitis B positive people, are clients or workers in certain care facilities, or travel to or work in countries with a high rate of hepatitis B should be immunized.  Haemophilus influenzae type b (Hib) vaccine. A previously unvaccinated person with asplenia or sickle cell disease or having a scheduled splenectomy should receive 1 dose of Hib vaccine. Regardless of previous immunization, a recipient of a hematopoietic stem cell transplant should receive a 3-dose series 6-12 months after her successful transplant. Hib vaccine is not recommended for adults with HIV infection. Preventive Services / Frequency Ages 45 to 56 years  Blood pressure check.** / Every 1 to 2 years.  Lipid and cholesterol check.** / Every 5 years beginning at age 43.  Clinical breast exam.** / Every 3 years for women in their 66s and 31s.  BRCA-related cancer risk assessment.** / For women who have family members with a BRCA-related cancer (breast, ovarian, tubal, or peritoneal cancers).  Pap test.** / Every 2 years from ages 49 through 29. Every 3 years starting at age 44 through age 73 or 28 with a history of 3 consecutive normal Pap tests.  HPV screening.** / Every 3 years from ages 97 through ages 75 to 93 with a history of 3 consecutive normal Pap tests.  Hepatitis C blood test.** / For any individual with known risks for hepatitis C.  Skin self-exam. / Monthly.  Influenza vaccine. / Every year.  Tetanus,  diphtheria, and acellular pertussis (Tdap, Td) vaccine.** / Consult your health care provider. Pregnant women should receive 1 dose of Tdap vaccine during each pregnancy. 1 dose of Td every 10 years.  Varicella vaccine.** / Consult your health care provider. Pregnant females who do not have evidence of immunity should receive the first dose after pregnancy.  HPV vaccine. / 3 doses over 6 months, if 68 and younger.  The vaccine is not recommended for use in pregnant females. However, pregnancy testing is not needed before receiving a dose.  Measles, mumps, rubella (MMR) vaccine.** / You need at least 1 dose of MMR if you were born in 1957 or later. You may also need a 2nd dose. For females of childbearing age, rubella immunity should be determined. If there is no evidence of immunity, females who are not pregnant should be vaccinated. If there is no evidence of immunity, females who are pregnant should delay immunization until after pregnancy.  Pneumococcal 13-valent conjugate (PCV13) vaccine.** / Consult your health care provider.  Pneumococcal polysaccharide (PPSV23) vaccine.** / 1 to 2 doses if you smoke cigarettes or if you have certain conditions.  Meningococcal vaccine.** / 1 dose if you are age 45 to 63 years and a Market researcher living in a residence hall, or have one of several medical conditions, you need to get vaccinated against meningococcal disease. You may also need additional booster doses.  Hepatitis A vaccine.** / Consult your health care provider.  Hepatitis B vaccine.** / Consult your health care provider.  Haemophilus influenzae type b (Hib) vaccine.** / Consult your health care provider. Ages 2 to 19 years  Blood pressure check.** / Every 1 to 2 years.  Lipid and cholesterol check.** / Every 5 years beginning at age 31 years.  Lung cancer screening. / Every year if you are aged 28-80 years and have a 30-pack-year history of smoking and currently smoke or have quit within the past 15 years. Yearly screening is stopped once you have quit smoking for at least 15 years or develop a health problem that would prevent you from having lung cancer treatment.  Clinical breast exam.** / Every year after age 73 years.  BRCA-related cancer risk assessment.** / For women who have family members with a BRCA-related cancer (breast, ovarian, tubal, or peritoneal  cancers).  Mammogram.** / Every year beginning at age 50 years and continuing for as long as you are in good health. Consult with your health care provider.  Pap test.** / Every 3 years starting at age 23 years through age 83 or 25 years with a history of 3 consecutive normal Pap tests.  HPV screening.** / Every 3 years from ages 63 years through ages 3 to 50 years with a history of 3 consecutive normal Pap tests.  Fecal occult blood test (FOBT) of stool. / Every year beginning at age 68 years and continuing until age 66 years. You may not need to do this test if you get a colonoscopy every 10 years.  Flexible sigmoidoscopy or colonoscopy.** / Every 5 years for a flexible sigmoidoscopy or every 10 years for a colonoscopy beginning at age 71 years and continuing until age 60 years.  Hepatitis C blood test.** / For all people born from [birth year] through 1965 and any individual with known risks for hepatitis C.  Skin self-exam. / Monthly.  Influenza vaccine. / Every year.  Tetanus, diphtheria, and acellular pertussis (Tdap/Td) vaccine.** / Consult your health care provider. Pregnant women should receive 1 dose of  Tdap vaccine during each pregnancy. 1 dose of Td every 10 years.  Varicella vaccine.** / Consult your health care provider. Pregnant females who do not have evidence of immunity should receive the first dose after pregnancy.  Zoster vaccine.** / 1 dose for adults aged 60 years or older.  Measles, mumps, rubella (MMR) vaccine.** / You need at least 1 dose of MMR if you were born in 1957 or later. You may also need a 2nd dose. For females of childbearing age, rubella immunity should be determined. If there is no evidence of immunity, females who are not pregnant should be vaccinated. If there is no evidence of immunity, females who are pregnant should delay immunization until after pregnancy.  Pneumococcal 13-valent conjugate (PCV13) vaccine.** / Consult your health care  provider.  Pneumococcal polysaccharide (PPSV23) vaccine.** / 1 to 2 doses if you smoke cigarettes or if you have certain conditions.  Meningococcal vaccine.** / Consult your health care provider.  Hepatitis A vaccine.** / Consult your health care provider.  Hepatitis B vaccine.** / Consult your health care provider.  Haemophilus influenzae type b (Hib) vaccine.** / Consult your health care provider. Ages 19 years and over  Blood pressure check.** / Every 1 to 2 years.  Lipid and cholesterol check.** / Every 5 years beginning at age 32 years.  Lung cancer screening. / Every year if you are aged 6-80 years and have a 30-pack-year history of smoking and currently smoke or have quit within the past 15 years. Yearly screening is stopped once you have quit smoking for at least 15 years or develop a health problem that would prevent you from having lung cancer treatment.  Clinical breast exam.** / Every year after age 46 years.  BRCA-related cancer risk assessment.** / For women who have family members with a BRCA-related cancer (breast, ovarian, tubal, or peritoneal cancers).  Mammogram.** / Every year beginning at age 24 years and continuing for as long as you are in good health. Consult with your health care provider.  Pap test.** / Every 3 years starting at age 39 years through age 62 or 67 years with 3 consecutive normal Pap tests. Testing can be stopped between 65 and 70 years with 3 consecutive normal Pap tests and no abnormal Pap or HPV tests in the past 10 years.  HPV screening.** / Every 3 years from ages 70 years through ages 44 or 23 years with a history of 3 consecutive normal Pap tests. Testing can be stopped between 65 and 70 years with 3 consecutive normal Pap tests and no abnormal Pap or HPV tests in the past 10 years.  Fecal occult blood test (FOBT) of stool. / Every year beginning at age 7 years and continuing until age 24 years. You may not need to do this test if you get  a colonoscopy every 10 years.  Flexible sigmoidoscopy or colonoscopy.** / Every 5 years for a flexible sigmoidoscopy or every 10 years for a colonoscopy beginning at age 69 years and continuing until age 46 years.  Hepatitis C blood test.** / For all people born from [birth year] through 1965 and any individual with known risks for hepatitis C.  Osteoporosis screening.** / A one-time screening for women ages 59 years and over and women at risk for fractures or osteoporosis.  Skin self-exam. / Monthly.  Influenza vaccine. / Every year.  Tetanus, diphtheria, and acellular pertussis (Tdap/Td) vaccine.** / 1 dose of Td every 10 years.  Varicella vaccine.** / Consult your health care provider.  Zoster vaccine.** / 1 dose for adults aged 33 years or older.  Pneumococcal 13-valent conjugate (PCV13) vaccine.** / Consult your health care provider.  Pneumococcal polysaccharide (PPSV23) vaccine.** / 1 dose for all adults aged 80 years and older.  Meningococcal vaccine.** / Consult your health care provider.  Hepatitis A vaccine.** / Consult your health care provider.  Hepatitis B vaccine.** / Consult your health care provider.  Haemophilus influenzae type b (Hib) vaccine.** / Consult your health care provider. ** Family history and personal history of risk and conditions may change your health care provider's recommendations. Document Released: 08/14/2001 Document Revised: 11/02/2013 Document Reviewed: 11/13/2010 [facility] Patient Information 2015 [place], [place]. This information is not intended to replace advice given to you by your health care provider. Make sure you discuss any questions you have with your health care provider.

## 2014-05-17 NOTE — Addendum Note (Signed)
Addended by: Marylin Crosby on: 05/17/2014 04:09 PM   Modules accepted: Orders

## 2014-05-17 NOTE — Progress Notes (Signed)
Patient ID: Meredith Anderson, female   DOB: 01/04/1941, 73 y.o.   MRN: [id number] Pt given Prevnar 13 injection left deltoid IM. Pt tolerated injection well.

## 2014-05-17 NOTE — Progress Notes (Signed)
Patient ID: Meredith Anderson, female   DOB: 07/25/1940, 73 y.o.   MRN: [id number] Subjective:    Meredith Anderson is a 73 y.o. female who presents for Medicare Initial Wellness Visit and recheck protime  Preventive Screening-Counseling & Management  Tobacco History  Smoking status  . Never Smoker   Smokeless tobacco  . Never Used    Current Problems (verified) Patient Active Problem List   Diagnosis Date Noted  . Strain of neck muscle 04/26/2014  . Elevated random blood glucose level 02/19/2014  . Elevated cholesterol with high triglycerides 02/19/2014  . Metabolic syndrome 82/50/5397  . Essential hypertension, benign 04/03/2013  . Atrial fibrillation 11/03/2012  . Long term (current) use of anticoagulants 11/03/2012    Medications Prior to Visit Current Outpatient Prescriptions on File Prior to Visit  Medication Sig Dispense Refill  . ACCU-CHEK AVIVA PLUS test strip USE TO CHECK BLOOD SUGAR ONCE A DAY OR AS INSTRUCTED 50 each 3  . acetaminophen (TYLENOL) 500 MG tablet 500 mg. Take 500 mg by mouth every 6 (six) hours as needed for Pain.    Marland Kitchen aspirin (ASPIRIN LOW DOSE) 81 MG EC tablet Take 81 mg by mouth daily.      Marland Kitchen conjugated estrogens (PREMARIN) vaginal cream Place 1 g vaginally 2 (two) times a week. Place vaginally twice a week.    . diclofenac sodium (VOLTAREN) 1 % GEL Apply 1 to 2 grams to area of pain up to qid as needed 100 g 0  . diltiazem (CARDIZEM CD) 120 MG 24 hr capsule Take 1 capsule (120 mg total) by mouth daily. Take one tablet by mouth daily for heart rate 90 capsule 1  . glipiZIDE (GLUCOTROL XL) 5 MG 24 hr tablet Take 1 tablet (5 mg total) by mouth daily with breakfast. 90 tablet 3  . hydrOXYzine (ATARAX/VISTARIL) 25 MG tablet TAKE ONE TABLET BY MOUTH THREE TIMES A DAY AS NEEDED FOR ANXIETY 30 tablet 0  . levothyroxine (SYNTHROID, LEVOTHROID) 125 MCG tablet TAKE 1 TABLET (125 MCG TOTAL) BY MOUTH DAILY. 90 tablet 0  . lisinopril-hydrochlorothiazide (PRINZIDE,ZESTORETIC)  20-25 MG per tablet TAKE ONE TABLET BY MOUTH ONE TIME DAILY 90 tablet 0  . magnesium oxide (MAG-OX) 400 MG tablet 400 mg.    . PROCTOZONE-HC 2.5 % rectal cream APPLY TO RECTAL AREA EVERY 6 HOURS AS NEEDED FOR HEMORRHOIDS 30 g 2  . propafenone (RYTHMOL) 150 MG tablet Take 225 mg by mouth every 8 (eight) hours.     . Cholecalciferol 1000 UNITS capsule Take 1,000 Units by mouth daily.    . citalopram (CELEXA) 20 MG tablet Take 1 tablet (20 mg total) by mouth daily. 30 tablet 3  . diphenhydrAMINE (BENADRYL) 25 mg capsule 25 mg.    . TETRAHYDROZOLINE HCL OP Apply to eye. Use as needed for redness     . traMADol (ULTRAM) 50 MG tablet Take 1 tablet (50 mg total) by mouth every 8 (eight) hours as needed. 30 tablet 0   No current facility-administered medications on file prior to visit.    Current Medications (verified) Current Outpatient Prescriptions  Medication Sig Dispense Refill  . ACCU-CHEK AVIVA PLUS test strip USE TO CHECK BLOOD SUGAR ONCE A DAY OR AS INSTRUCTED 50 each 3  . acetaminophen (TYLENOL) 500 MG tablet 500 mg. Take 500 mg by mouth every 6 (six) hours as needed for Pain.    Marland Kitchen aspirin (ASPIRIN LOW DOSE) 81 MG EC tablet Take 81 mg by mouth daily.      Marland Kitchen  conjugated estrogens (PREMARIN) vaginal cream Place 1 g vaginally 2 (two) times a week. Place vaginally twice a week.    . diclofenac sodium (VOLTAREN) 1 % GEL Apply 1 to 2 grams to area of pain up to qid as needed 100 g 0  . diltiazem (CARDIZEM CD) 120 MG 24 hr capsule Take 1 capsule (120 mg total) by mouth daily. Take one tablet by mouth daily for heart rate 90 capsule 1  . glipiZIDE (GLUCOTROL XL) 5 MG 24 hr tablet Take 1 tablet (5 mg total) by mouth daily with breakfast. 90 tablet 3  . hydrOXYzine (ATARAX/VISTARIL) 25 MG tablet TAKE ONE TABLET BY MOUTH THREE TIMES A DAY AS NEEDED FOR ANXIETY 30 tablet 0  . levothyroxine (SYNTHROID, LEVOTHROID) 125 MCG tablet TAKE 1 TABLET (125 MCG TOTAL) BY MOUTH DAILY. 90 tablet 0  .  lisinopril-hydrochlorothiazide (PRINZIDE,ZESTORETIC) 20-25 MG per tablet TAKE ONE TABLET BY MOUTH ONE TIME DAILY 90 tablet 0  . magnesium oxide (MAG-OX) 400 MG tablet 400 mg.    . PROCTOZONE-HC 2.5 % rectal cream APPLY TO RECTAL AREA EVERY 6 HOURS AS NEEDED FOR HEMORRHOIDS 30 g 2  . propafenone (RYTHMOL) 150 MG tablet Take 225 mg by mouth every 8 (eight) hours.     . rosuvastatin (CRESTOR) 20 MG tablet Take 1 tablet (20 mg total) by mouth daily. 90 tablet 1  . Cholecalciferol 1000 UNITS capsule Take 1,000 Units by mouth daily.    . citalopram (CELEXA) 20 MG tablet Take 1 tablet (20 mg total) by mouth daily. 30 tablet 3  . diphenhydrAMINE (BENADRYL) 25 mg capsule 25 mg.    . TETRAHYDROZOLINE HCL OP Apply to eye. Use as needed for redness     . traMADol (ULTRAM) 50 MG tablet Take 1 tablet (50 mg total) by mouth every 8 (eight) hours as needed. 30 tablet 0  . warfarin (COUMADIN) 2 MG tablet Take 1 tablet (2 mg total) by mouth daily. Take 1 to 2 tablets by mouth daily as directed by anticoagulation clinic. 120 tablet 1   No current facility-administered medications for this visit.     Allergies (verified) Amlodipine; Dronedarone; Glipizide-metformin hcl; Metformin; Metformin and related; Norvasc; and Niaspan   PAST HISTORY  Family History Family History  Problem Relation Age of Onset  . Stroke Mother   . Diabetes Mother   . Heart disease Father   . Cancer Sister   . Diabetes Sister   . Heart disease Brother   . Hyperlipidemia Brother   . Hypertension Brother   . Diabetes Brother   . Hyperlipidemia Sister   . Diabetes Sister   . Hyperlipidemia Sister   . Diabetes Sister   . Cancer Sister   . Diabetes Sister     Social History History  Substance Use Topics  . Smoking status: Never Smoker   . Smokeless tobacco: Never Used  . Alcohol Use: No     Are there smokers in your home (other than you)? No  Risk Factors Current exercise habits: The patient does not participate in  regular exercise at present.  Dietary issues discussed: limiting CHO   Cardiac risk factors: advanced age (older than 72 for men, 4 for women), diabetes mellitus, dyslipidemia, family history of premature cardiovascular disease, hypertension and sedentary lifestyle.  Depression Screen (Note: if answer to either of the following is "Yes", a more complete depression screening is indicated)   Over the past 2 weeks, have you felt down, depressed or hopeless? No  Over the  past 2 weeks, have you felt little interest or pleasure in doing things? No  Have you lost interest or pleasure in daily life? No  Do you often feel hopeless? No  Do you cry easily over simple problems? Yes  Activities of Daily Living In your present state of health, do you have any difficulty performing the following activities?:  Driving? No Managing money?  No Feeding yourself? No Getting from bed to chair? No  Climbing a flight of stairs? No - but hs to use handrails. Preparing food and eating?: No Bathing or showering? No Getting dressed: No Getting to the toilet? No Using the toilet:No Moving around from place to place: No In the past year have you fallen or had a near fall?:Yes   Are you sexually active?  No  Do you have more than one partner?  No  Hearing Difficulties: Yes Do you often ask people to speak up or repeat themselves? No Do you experience ringing or noises in your ears? Yes Do you have difficulty understanding soft or whispered voices? No   Do you feel that you have a problem with memory? No  Do you often misplace items? Yes  Do you feel safe at home?  Yes  Cognitive Testing  Alert? Yes  Normal Appearance?Yes  Oriented to person? Yes  Place? Yes   Time? Yes  Recall of three objects?  Yes  Can perform simple calculations? Yes  Displays appropriate judgment?Yes  Can read the correct time from a watch face?Yes   Advanced Directives have been discussed with the patient? Yes  List the  Names of Other Physician/Practitioners you currently use: 1.  Cardiology - Norville Haggard, MD and Anette Guarneri, MD 2.  [place], OD  Indicate any recent Medical Services you may have received from other than [facility] providers in the past year (date may be approximate).  Immunization History  Administered Date(s) Administered  . Influenza Whole 04/01/2012  . Influenza,inj,Quad PF,36+ Mos 04/03/2013  . Influenza-Unspecified 03/15/2014  . Pneumococcal Polysaccharide-23 08/02/2009  . Tdap 01/31/2012  . Zoster 01/31/2012    Screening Tests Health Maintenance  Topic Date Due  . FOOT EXAM  06/24/1951  . OPHTHALMOLOGY EXAM  06/24/1951  . URINE MICROALBUMIN  [birth year]1951  . MAMMOGRAM  07/25/2014  . HEMOGLOBIN A1C  08/28/2014  . INFLUENZA VACCINE  01/31/2015  . COLONOSCOPY  11/01/2019  . TETANUS/TDAP  01/30/2022  . PNEUMOCOCCAL POLYSACCHARIDE VACCINE AGE 64 AND OVER  Completed  . ZOSTAVAX  Completed    All answers were reviewed with the patient and necessary referrals were made:  Cherre Robins, [facility]   05/17/2014   History reviewed: allergies, current medications, past family history, past medical history, past social history, past surgical history and problem list      Objective:     Body mass index is 28.22 kg/(m^2). BP 142/80 mmHg  Pulse 82  Ht 5\' 4"  (1.626 m)  Wt 164 lb 8 oz (74.617 kg)  BMI 28.22 kg/m2   Detail food exam performed - see flow sheet  Assessment:     Initial Annual Wellness Visit Therapeutic Anticoagulation Foot Exam     Plan:     During the course of the visit the patient was educated and counseled about appropriate screening and preventive services including:    Pneumococcal vaccine - Prevnar 13 given in office today  Influenza vaccine - UTD  Td vaccine - UTD  Zostavax - UTD  Screening mammography- due 07/2014  Bone densitometry  screening - odered today  Colorectal cancer screening - UTD  Glaucoma and diabetic eye exam -  due now, offered to make referral but patient states she will make appt with Dr Hassell Done ASAP  Advanced directives: has NO advanced directive - not interested in additional information  Checking urine microalbumin today    Anticoagulation Dose Instructions as of 05/17/2014      Dorene Grebe Tue Wed Thu Fri Sat   New Dose 2 mg 4 mg 2 mg 2 mg 2 mg 4 mg 2 mg    Description        Continue same warfarin dose of 4mg  or 2 tablets on mondays and fridays and 2mg  or 1 tablet all other days      Patient Instructions (the written plan) was given to the patient.  Medicare Attestation I have personally reviewed: The patient's medical and social history Their use of alcohol, tobacco or illicit drugs Their current medications and supplements The patient's functional ability including ADLs,fall risks, home safety risks, cognitive, and hearing and visual impairment Diet and physical activities Evidence for depression or mood disorders  The patient's weight, height, BMI, and visual acuity have been recorded in the chart.  I have made referrals, counseling, and provided education to the patient based on review of the above and I have provided the patient with a written personalized care plan for preventive services.     Cherre Robins, [facility]   05/17/2014

## 2014-05-18 LAB — MICROALBUMIN, URINE

## 2014-05-19 ENCOUNTER — Telehealth: Payer: Self-pay | Admitting: Pharmacist

## 2014-05-19 NOTE — Telephone Encounter (Signed)
Urine microablumin was WNL.

## 2014-06-17 ENCOUNTER — Ambulatory Visit (INDEPENDENT_AMBULATORY_CARE_PROVIDER_SITE_OTHER): Payer: Commercial Managed Care - HMO | Admitting: Pharmacist

## 2014-06-17 DIAGNOSIS — I4891 Unspecified atrial fibrillation: Secondary | ICD-10-CM

## 2014-06-17 DIAGNOSIS — I482 Chronic atrial fibrillation, unspecified: Secondary | ICD-10-CM

## 2014-06-17 DIAGNOSIS — Z7901 Long term (current) use of anticoagulants: Secondary | ICD-10-CM

## 2014-06-17 LAB — POCT INR: INR: 1.8

## 2014-06-17 MED ORDER — HYDROXYZINE HCL 25 MG PO TABS: 25.0000 mg | ORAL_TABLET | Freq: Three times a day (TID) | ORAL | Status: DC | PRN

## 2014-06-17 NOTE — Patient Instructions (Signed)
Anticoagulation Dose Instructions as of 06/17/2014      Dorene Grebe Tue Wed Thu Fri Sat   New Dose 2 mg 4 mg 2 mg 2 mg 2 mg 4 mg 2 mg    Description        Take 2 tablets today, then continue same warfarin dose of 4mg  or 2 tablets on mondays and fridays and 2mg  or 1 tablet all other days.      INR was 1.8 today

## 2014-07-02 DIAGNOSIS — M8580 Other specified disorders of bone density and structure, unspecified site: Secondary | ICD-10-CM

## 2014-07-02 HISTORY — DX: Other specified disorders of bone density and structure, unspecified site: M85.80

## 2014-07-19 ENCOUNTER — Ambulatory Visit (INDEPENDENT_AMBULATORY_CARE_PROVIDER_SITE_OTHER): Payer: Commercial Managed Care - HMO | Admitting: Pharmacist

## 2014-07-19 DIAGNOSIS — I4891 Unspecified atrial fibrillation: Secondary | ICD-10-CM

## 2014-07-19 DIAGNOSIS — Z7901 Long term (current) use of anticoagulants: Secondary | ICD-10-CM

## 2014-07-19 DIAGNOSIS — I482 Chronic atrial fibrillation, unspecified: Secondary | ICD-10-CM

## 2014-07-19 LAB — POCT INR: INR: 1.5

## 2014-07-19 MED ORDER — LISINOPRIL-HYDROCHLOROTHIAZIDE 20-25 MG PO TABS: 1.0000 | ORAL_TABLET | Freq: Every morning | ORAL | Status: DC

## 2014-07-19 MED ORDER — PROPAFENONE HCL 225 MG PO TABS: 225.0000 mg | ORAL_TABLET | Freq: Three times a day (TID) | ORAL | Status: DC

## 2014-07-19 NOTE — Patient Instructions (Signed)
Anticoagulation Dose Instructions as of 07/19/2014      Meredith Anderson Tue Wed Thu Fri Sat   New Dose 2 mg 4 mg 2 mg 2 mg 2 mg 4 mg 2 mg    Description        Take 3 tablets today - Monday, January 18th 2016, then increase warfarin dose to 4mg  or 2 tablets on mondays, wednesdays and fridays and 2mg  or 1 tablet all other days.      INR was 1.5 today

## 2014-07-20 DIAGNOSIS — Z029 Encounter for administrative examinations, unspecified: Secondary | ICD-10-CM

## 2014-08-04 ENCOUNTER — Ambulatory Visit: Payer: Medicare HMO

## 2014-08-04 ENCOUNTER — Other Ambulatory Visit: Payer: Medicare HMO

## 2014-08-07 ENCOUNTER — Other Ambulatory Visit: Payer: Self-pay | Admitting: Family Medicine

## 2014-08-11 ENCOUNTER — Ambulatory Visit (INDEPENDENT_AMBULATORY_CARE_PROVIDER_SITE_OTHER): Payer: Commercial Managed Care - HMO

## 2014-08-11 ENCOUNTER — Ambulatory Visit (INDEPENDENT_AMBULATORY_CARE_PROVIDER_SITE_OTHER): Payer: Commercial Managed Care - HMO | Admitting: Pharmacist

## 2014-08-11 ENCOUNTER — Encounter: Payer: Self-pay | Admitting: Pharmacist

## 2014-08-11 VITALS — Ht 64.0 in | Wt 166.0 lb

## 2014-08-11 DIAGNOSIS — M899 Disorder of bone, unspecified: Secondary | ICD-10-CM | POA: Diagnosis not present

## 2014-08-11 DIAGNOSIS — Z13820 Encounter for screening for osteoporosis: Secondary | ICD-10-CM

## 2014-08-11 DIAGNOSIS — Z7901 Long term (current) use of anticoagulants: Secondary | ICD-10-CM

## 2014-08-11 DIAGNOSIS — I4891 Unspecified atrial fibrillation: Secondary | ICD-10-CM

## 2014-08-11 DIAGNOSIS — M8580 Other specified disorders of bone density and structure, unspecified site: Secondary | ICD-10-CM

## 2014-08-11 LAB — POCT INR: INR: 2

## 2014-08-11 LAB — HM DEXA SCAN

## 2014-08-11 NOTE — Patient Instructions (Signed)
Anticoagulation Dose Instructions as of 08/11/2014      Meredith Anderson Tue Wed Thu Fri Sat   New Dose 2 mg 4 mg 2 mg 4 mg 2 mg 4 mg 2 mg    Description        Continue warfarin dose of 7m or 2 tablets on mondays, wednesdays and fridays and 27mor 1 tablet all other days.      INR was 2.0 today                  Exercise for Strong Bones  Exercise is important to build and maintain strong bones / bone density.  There are 2 types of exercises that are important to building and maintaining strong bones:  Weight- bearing and muscle-stregthening.  Weight-bearing Exercises  These exercises include activities that make you move against gravity while staying upright. Weight-bearing exercises can be high-impact or low-impact.  High-impact weight-bearing exercises help build bones and keep them strong. If you have broken a bone due to osteoporosis or are at risk of breaking a bone, you may need to avoid high-impact exercises. If you're not sure, you should check with your healthcare provider.  Examples of high-impact weight-bearing exercises are: Dancing  Doing high-impact aerobics  Hiking  Jogging/running  Jumping Rope  Stair climbing  Tennis  Low-impact weight-bearing exercises can also help keep bones strong and are a safe alternative if you cannot do high-impact exercises.   Examples of low-impact weight-bearing exercises are: Using elliptical training machines  Doing low-impact aerobics  Using stair-step machines  Fast walking on a treadmill or outside   Muscle-Strengthening Exercises These exercises include activities where you move your body, a weight or some other resistance against gravity. They are also known as resistance exercises and include: Lifting weights  Using elastic exercise bands  Using weight machines  Lifting your own body weight  Functional movements, such as standing and rising up on your toes  Yoga and Pilates can also improve strength, balance and  flexibility. However, certain positions may not be safe for people with osteoporosis or those at increased risk of broken bones. For example, exercises that have you bend forward may increase the chance of breaking a bone in the spine.   Non-Impact Exercises There are other types of exercises that can help prevent falls.  Non-impact exercises can help you to improve balance, posture and how well you move in everyday activities. Some of these exercises include: Balance exercises that strengthen your legs and test your balance, such as Tai Chi, can decrease your risk of falls.  Posture exercises that improve your posture and reduce rounded or "sloping" shoulders can help you decrease the chance of breaking a bone, especially in the spine.  Functional exercises that improve how well you move can help you with everyday activities and decrease your chance of falling and breaking a bone. For example, if you have trouble getting up from a chair or climbing stairs, you should do these activities as exercises.   **A physical therapist can teach you balance, posture and functional exercises. He/she can also help you learn which exercises are safe and appropriate for you.  [place] has a physical therapy office in [place] front of our office and referrals can be made for assessments and treatment as needed and strength and balance training.  If you would like to have an assessment with Meredith Anderson our physical therapy team please let a nurse or provider know.

## 2014-08-11 NOTE — Patient Instructions (Addendum)
Anticoagulation Dose Instructions as of 08/11/2014      Meredith Anderson Tue Wed Thu Fri Sat   New Dose 2 mg 4 mg 2 mg 4 mg 2 mg 4 mg 2 mg    Description        Continue warfarin dose of 7m or 2 tablets on mondays, wednesdays and fridays and 27mor 1 tablet all other days.      INR was 2.0 today                  Exercise for Strong Bones  Exercise is important to build and maintain strong bones / bone density.  There are 2 types of exercises that are important to building and maintaining strong bones:  Weight- bearing and muscle-stregthening.  Weight-bearing Exercises  These exercises include activities that make you move against gravity while staying upright. Weight-bearing exercises can be high-impact or low-impact.  High-impact weight-bearing exercises help build bones and keep them strong. If you have broken a bone due to osteoporosis or are at risk of breaking a bone, you may need to avoid high-impact exercises. If you're not sure, you should check with your healthcare provider.  Examples of high-impact weight-bearing exercises are: Dancing  Doing high-impact aerobics  Hiking  Jogging/running  Jumping Rope  Stair climbing  Tennis  Low-impact weight-bearing exercises can also help keep bones strong and are a safe alternative if you cannot do high-impact exercises.   Examples of low-impact weight-bearing exercises are: Using elliptical training machines  Doing low-impact aerobics  Using stair-step machines  Fast walking on a treadmill or outside   Muscle-Strengthening Exercises These exercises include activities where you move your body, a weight or some other resistance against gravity. They are also known as resistance exercises and include: Lifting weights  Using elastic exercise bands  Using weight machines  Lifting your own body weight  Functional movements, such as standing and rising up on your toes  Yoga and Pilates can also improve strength, balance and  flexibility. However, certain positions may not be safe for people with osteoporosis or those at increased risk of broken bones. For example, exercises that have you bend forward may increase the chance of breaking a bone in the spine.   Non-Impact Exercises There are other types of exercises that can help prevent falls.  Non-impact exercises can help you to improve balance, posture and how well you move in everyday activities. Some of these exercises include: Balance exercises that strengthen your legs and test your balance, such as Tai Chi, can decrease your risk of falls.  Posture exercises that improve your posture and reduce rounded or "sloping" shoulders can help you decrease the chance of breaking a bone, especially in the spine.  Functional exercises that improve how well you move can help you with everyday activities and decrease your chance of falling and breaking a bone. For example, if you have trouble getting up from a chair or climbing stairs, you should do these activities as exercises.   **A physical therapist can teach you balance, posture and functional exercises. He/she can also help you learn which exercises are safe and appropriate for you.  [place] has a physical therapy office in [place] front of our office and referrals can be made for assessments and treatment as needed and strength and balance training.  If you would like to have an assessment with Meredith Anderson our physical therapy team please let a nurse or provider know.

## 2014-08-11 NOTE — Progress Notes (Signed)
Patient ID: Meredith Anderson, female   DOB: [birth year], 74 y.o.   MRN: [id number]  [facility] Current Height:        Max Lifetime Height:  5' 5.5" Current Weight:         Ethnicity:Caucasian  BP:       HR:         HPI: Does pt already have a diagnosis of:  Osteopenia?  No Osteoporosis?  No  Back Pain?  No       Kyphosis?  No Prior fracture?  No Med(s) for Osteoporosis/Osteopenia:  none Med(s) previously tried for Osteoporosis/Osteopenia:  none                                                             PMH: Age at menopause:  72's Hysterectomy?  Yes Oophorectomy?  No HRT? Yes - Current.  Type/duration: premarin cream currently Steroid Use?  No Thyroid med?  Yes History of cancer?  No History of digestive disorders (ie Crohn's)?  No Current or previous eating disorders?  No Last Vitamin D Result:  32.2 (03/01/2014) Last GFR Result:  59 (04/01/2014)   FH/SH: Family history of osteoporosis?  No Parent with history of hip fracture?  No Family history of breast cancer?  No Exercise?  No Smoking?  No Alcohol?  No    Calcium Assessment Calcium Intake  # of servings/day  Calcium mg  Milk (8 oz) 1  x  300  = 33m  Yogurt (4 oz) 0.5 x  200 = 1068mCheese (1 oz) 0 x  200 = 0  Other Calcium sources   25076mCa supplement Alk Seltzer chews = 300m58mEstimated calcium intake per day 950mg84mDEXA Results Date of Test T-Score for AP Spine L1-L4 T-Score for Total Left Hip T-Score for Total Right Hip  08/11/2014 0.5 -0.8 -0.9  06/04/2012 0.9 -0.2 -0.4  08/24/2009 1.1 0.2 -0.1       Lowest T-Score = -1.3 (neck of right and left hip)  FRAX 10 year estimate: Total FX risk:  10%  (consider medication if >/= 20%) Hip FX risk:  1.5%  (consider medication if >/= 3%)  INR was 2.0 today Assessment: Osteopenia - decrease in BMD Therapeutic anticoagulation  Recommendations: 1.  Discussed BMD results and fracture risk 2.  recommend calcium 1200mg 5my through  supplementation or diet.  3.  recommend weight bearing exercise - 30 minutes at least 4 days per week.   4.  Counseled and educated about fall risk and prevention. 5.   Anticoagulation Dose Instructions as of 08/11/2014      Sun MoDorene Grebeed Thu Fri Sat   New Dose 2 mg 4 mg 2 mg 4 mg 2 mg 4 mg 2 mg    Description        Continue warfarin dose of 4mg or31mtablets on mondays, wednesdays and fridays and 2mg or 75mablet all other days.      Recheck INR in 1 month Recheck DEXA:  2 years  Time spent counseling patient:  30 minutes  Mavryk Pino EcCherre Robins, CPP

## 2014-09-12 ENCOUNTER — Other Ambulatory Visit: Payer: Self-pay | Admitting: Pharmacist

## 2014-09-13 ENCOUNTER — Ambulatory Visit (INDEPENDENT_AMBULATORY_CARE_PROVIDER_SITE_OTHER): Payer: Commercial Managed Care - HMO | Admitting: Pharmacist

## 2014-09-13 ENCOUNTER — Other Ambulatory Visit: Payer: Self-pay | Admitting: Family Medicine

## 2014-09-13 DIAGNOSIS — I482 Chronic atrial fibrillation, unspecified: Secondary | ICD-10-CM

## 2014-09-13 DIAGNOSIS — I4891 Unspecified atrial fibrillation: Secondary | ICD-10-CM | POA: Diagnosis not present

## 2014-09-13 DIAGNOSIS — Z7901 Long term (current) use of anticoagulants: Secondary | ICD-10-CM

## 2014-09-13 LAB — POCT INR: INR: 1.7

## 2014-09-13 MED ORDER — ESCITALOPRAM OXALATE 10 MG PO TABS: 10.0000 mg | ORAL_TABLET | Freq: Every day | ORAL | Status: DC

## 2014-09-13 NOTE — Patient Instructions (Signed)
Anticoagulation Dose Instructions as of 09/13/2014      Dorene Grebe Tue Wed Thu Fri Sat   New Dose 4 mg 4 mg 2 mg 4 mg 2 mg 4 mg 2 mg    Description        Take 3 tablets today = 6mg  then increase dose to Continue warfarin dose of 4mg  or 2 tablets on sundays, mondays, wednesdays and fridays and 2mg  or 1 tablet tuesdays, thursdays and saturdays.     INR was 1.7 today (too thick)

## 2014-09-13 NOTE — Progress Notes (Signed)
Patient has stopped citalopram because she felt was causing edema.  She would like an alternative for anxiety because she would like to take less hydoxyzine.   Fluoxetine and sertraline could interfere with propafenone metabolism and affect HR.  Will try escitalopram 10mg  once daily - this might not have same side effect as citalopram.  Rx sent to her pharmacy.  Also made appt to follow up with PCP.

## 2014-10-04 ENCOUNTER — Ambulatory Visit (INDEPENDENT_AMBULATORY_CARE_PROVIDER_SITE_OTHER): Payer: Commercial Managed Care - HMO | Admitting: Pharmacist

## 2014-10-04 DIAGNOSIS — I482 Chronic atrial fibrillation, unspecified: Secondary | ICD-10-CM

## 2014-10-04 DIAGNOSIS — I4891 Unspecified atrial fibrillation: Secondary | ICD-10-CM

## 2014-10-04 DIAGNOSIS — Z7901 Long term (current) use of anticoagulants: Secondary | ICD-10-CM

## 2014-10-04 LAB — POCT INR: INR: 2.5

## 2014-10-04 NOTE — Patient Instructions (Signed)
Anticoagulation Dose Instructions as of 10/04/2014      Meredith Anderson Tue Wed Thu Fri Sat   New Dose 4 mg 4 mg 2 mg 4 mg 2 mg 4 mg 2 mg    Description        Continue warfarin dose of 4mg  or 2 tablets on sundays, mondays, wednesdays and fridays and 2mg  or 1 tablet tuesdays, thursdays and saturdays.

## 2014-10-16 ENCOUNTER — Other Ambulatory Visit: Payer: Self-pay | Admitting: Pharmacist

## 2014-10-22 ENCOUNTER — Other Ambulatory Visit: Payer: Self-pay | Admitting: Pharmacist

## 2014-11-05 ENCOUNTER — Other Ambulatory Visit: Payer: Self-pay | Admitting: Family Medicine

## 2014-11-08 ENCOUNTER — Ambulatory Visit (INDEPENDENT_AMBULATORY_CARE_PROVIDER_SITE_OTHER): Payer: Commercial Managed Care - HMO | Admitting: Pharmacist

## 2014-11-08 DIAGNOSIS — I482 Chronic atrial fibrillation, unspecified: Secondary | ICD-10-CM

## 2014-11-08 LAB — POCT INR: INR: 2.3

## 2014-11-08 MED ORDER — WARFARIN SODIUM 2 MG PO TABS: ORAL_TABLET | ORAL | Status: DC

## 2014-11-19 ENCOUNTER — Encounter: Payer: Self-pay | Admitting: Gastroenterology

## 2014-11-24 ENCOUNTER — Encounter: Payer: Self-pay | Admitting: Family Medicine

## 2014-11-24 ENCOUNTER — Ambulatory Visit (INDEPENDENT_AMBULATORY_CARE_PROVIDER_SITE_OTHER): Payer: Commercial Managed Care - HMO | Admitting: Family Medicine

## 2014-11-24 VITALS — BP 121/64 | HR 64 | Temp 97.1°F | Ht 64.0 in | Wt 172.6 lb

## 2014-11-24 DIAGNOSIS — E8881 Metabolic syndrome: Secondary | ICD-10-CM | POA: Diagnosis not present

## 2014-11-24 DIAGNOSIS — I10 Essential (primary) hypertension: Secondary | ICD-10-CM | POA: Diagnosis not present

## 2014-11-24 DIAGNOSIS — E559 Vitamin D deficiency, unspecified: Secondary | ICD-10-CM | POA: Diagnosis not present

## 2014-11-24 DIAGNOSIS — E038 Other specified hypothyroidism: Secondary | ICD-10-CM

## 2014-11-24 DIAGNOSIS — E782 Mixed hyperlipidemia: Secondary | ICD-10-CM | POA: Diagnosis not present

## 2014-11-24 LAB — POCT CBC
GRANULOCYTE PERCENT: 70.3 % (ref 37–80)
HCT, POC: 38.8 % (ref 37.7–47.9)
Hemoglobin: 12 g/dL — AB (ref 12.2–16.2)
Lymph, poc: 1.5 (ref 0.6–3.4)
MCH, POC: 27.5 pg (ref 27–31.2)
MCHC: 30.9 g/dL — AB (ref 31.8–35.4)
MCV: 89 fL (ref 80–97)
MPV: 8.4 fL (ref 0–99.8)
POC Granulocyte: 4.6 (ref 2–6.9)
POC LYMPH %: 22.2 % (ref 10–50)
Platelet Count, POC: 247 10*3/uL (ref 142–424)
RBC: 4.36 M/uL (ref 4.04–5.48)
RDW, POC: 14.8 %
WBC: 6.6 10*3/uL (ref 4.6–10.2)

## 2014-11-24 LAB — POCT GLYCOSYLATED HEMOGLOBIN (HGB A1C): Hemoglobin A1C: 6.6

## 2014-11-24 NOTE — Progress Notes (Signed)
Subjective:  Patient ID: Meredith Anderson, female    DOB: 1940-07-19  Age: 74 y.o. MRN: [id number]  CC: Atrial Fibrillation; Hyperlipidemia; Hypothyroidism; and Hypertension   HPI Meredith Anderson presents forFollow-up of diabetes. Patient does not check blood sugar at home Patient denies symptoms such as polyuria, polydipsia, excessive hunger, nausea No significant hypoglycemic spells noted. Medications as noted below. Taking them regularly without complication/adverse reaction being reported today.   Patient presents for follow-up on  thyroid. She has a history of hypothyroidism for many years. It has been stable recently. Pt. denies any change in  voice, loss of hair, heat or cold intolerance. Energy level has been adequate to good. She denies constipation and diarrhea. No myxedema. Medication is as noted below. Verified that pt is taking it daily on an empty stomach. Well tolerated.   follow-up of hypertension. Patient has no history of headache chest pain or shortness of breath or recent cough. Patient also denies symptoms of TIA such as numbness weakness lateralizing. Patient checks  blood pressure at home and has not had any elevated readings recently. Patient denies side effects from his medication. States taking it regularly.     History Meredith Anderson has a past medical history of Hypertension; Hyperlipidemia; Dyshydrosis; NIDDM (non-insulin dependent diabetes mellitus); CA - cancer of parotid gland; Menopause; Hemorrhoids; Arthritis; A-fib; Anxiety; Thyroid disease; Cataract; and Osteopenia (2016).   She has past surgical history that includes Tubal ligation; bunionectomy bilateral; Abdominal hysterectomy; Eye surgery (Bilateral); Ablation; Rotator cuff repair (Right); and Lumbar disc surgery.   Her family history includes Cancer in her sister, sister, and sister; Diabetes in her brother, mother, sister, sister, and sister; Heart disease in her brother and father; Hyperlipidemia in her brother,  sister, and sister; Hypertension in her brother; Stroke in her mother.She reports that she has never smoked. She has never used smokeless tobacco. She reports that she does not drink alcohol or use illicit drugs.  Current Outpatient Prescriptions on File Prior to Visit  Medication Sig Dispense Refill  . ACCU-CHEK AVIVA PLUS test strip USE TO CHECK BLOOD SUGAR ONCE A DAY OR AS INSTRUCTED 50 each 3  . acetaminophen (TYLENOL) 500 MG tablet 500 mg. Take 500 mg by mouth every 6 (six) hours as needed for Pain.    . Cholecalciferol 1000 UNITS capsule Take 1,000 Units by mouth daily.    Marland Kitchen conjugated estrogens (PREMARIN) vaginal cream Place 1 g vaginally 2 (two) times a week. Place vaginally twice a week.    . diclofenac sodium (VOLTAREN) 1 % GEL Apply 1 to 2 grams to area of pain up to qid as needed 100 g 0  . diltiazem (CARDIZEM CD) 120 MG 24 hr capsule TAKE 1 CAPSULE BY MOUTH ONCE DAILY FOR HEART RATE 90 capsule 0  . diphenhydrAMINE (BENADRYL) 25 mg capsule 25 mg.    . escitalopram (LEXAPRO) 10 MG tablet Take 1 tablet (10 mg total) by mouth daily. Replaced citalopram / celexa 90 tablet 0  . GLIPIZIDE XL 5 MG 24 hr tablet TAKE ONE TABLET BY MOUTH DAILY WITH BREAKFAST 30 tablet 0  . hydrOXYzine (ATARAX/VISTARIL) 25 MG tablet Take 1 tablet (25 mg total) by mouth 3 (three) times daily as needed. for anxiety 90 tablet 0  . lisinopril-hydrochlorothiazide (PRINZIDE,ZESTORETIC) 20-25 MG per tablet Take 1 tablet by mouth every morning. For blood pressure 90 tablet 0  . magnesium oxide (MAG-OX) 400 MG tablet 400 mg.    . PROCTOZONE-HC 2.5 % rectal cream APPLY TO RECTAL  AREA EVERY 6 HOURS AS NEEDED FOR HEMORRHOIDS 30 g 2  . propafenone (RYTHMOL) 225 MG tablet TAKE 1 TABLET (225 MG TOTAL)  BY MOUTH EVERY 8 (EIGHT) HOURS. 270 tablet 1  . rosuvastatin (CRESTOR) 20 MG tablet Take 1 tablet (20 mg total) by mouth daily. 90 tablet 1  . TETRAHYDROZOLINE HCL OP Apply to eye. Use as needed for redness     . warfarin  (COUMADIN) 2 MG tablet TAKE 1 OR 2 TABLETS BY MOUTH DAILY AS INSTRUCTED BY ANTICOAGULATION CLINIC 120 tablet 1   No current facility-administered medications on file prior to visit.    ROS Review of Systems  Objective:  BP 121/64 mmHg  Pulse 64  Temp(Src) 97.1 F (36.2 C) (Oral)  Ht 5' 4"  (1.626 m)  Wt 172 lb 9.6 oz (78.291 kg)  BMI 29.61 kg/m2  BP Readings from Last 3 Encounters:  11/24/14 121/64  05/17/14 142/80  04/26/14 121/71    Wt Readings from Last 3 Encounters:  11/24/14 172 lb 9.6 oz (78.291 kg)  08/11/14 166 lb (75.297 kg)  05/17/14 164 lb 8 oz (74.617 kg)     Physical Exam  Lab Results  Component Value Date   HGBA1C 6.6 11/24/2014   HGBA1C 6.8 02/25/2014   HGBA1C 6.3 07/06/2013    Lab Results  Component Value Date   WBC 6.6 11/24/2014   HGB 12.0* 11/24/2014   HCT 38.8 11/24/2014   GLUCOSE 140* 11/24/2014   CHOL 125 11/24/2014   TRIG 189* 11/24/2014   HDL 40 11/24/2014   LDLCALC 50 02/25/2014   ALT 29 11/24/2014   AST 27 11/24/2014   NA 134 11/24/2014   K 4.1 11/24/2014   CL 94* 11/24/2014   CREATININE 1.27* 11/24/2014   BUN 29* 11/24/2014   CO2 23 11/24/2014   TSH 7.000* 11/24/2014   INR 2.3 11/08/2014   HGBA1C 6.6 11/24/2014     Assessment & Plan:   Meredith Anderson was seen today for atrial fibrillation, hyperlipidemia, hypothyroidism and hypertension.  Diagnoses and all orders for this visit:  Essential hypertension, benign Orders: -     POCT CBC; Standing -     POCT glycosylated hemoglobin (Hb A1C); Standing -     CMP14+EGFR; Standing -     POCT CBC -     POCT glycosylated hemoglobin (Hb A1C) -     CMP14+EGFR  Elevated cholesterol with high triglycerides Orders: -     POCT glycosylated hemoglobin (Hb A1C); Standing -     CMP14+EGFR; Standing -     Lipid panel; Standing -     NMR, lipoprofile -     POCT glycosylated hemoglobin (Hb A1C) -     LHT34+KAJG  Metabolic syndrome Orders: -     POCT glycosylated hemoglobin (Hb A1C);  Standing -     CMP14+EGFR; Standing -     POCT glycosylated hemoglobin (Hb A1C) -     CMP14+EGFR  Other specified hypothyroidism Orders: -     Thyroid Panel With TSH  Vitamin D deficiency Orders: -     Vit D  25 hydroxy (rtn osteoporosis monitoring)   I am having Ms. Buist maintain her TETRAHYDROZOLINE HCL OP, conjugated estrogens, acetaminophen, magnesium oxide, diphenhydrAMINE, PROCTOZONE-HC, Cholecalciferol, ACCU-CHEK AVIVA PLUS, diclofenac sodium, rosuvastatin, hydrOXYzine, lisinopril-hydrochlorothiazide, escitalopram, propafenone, diltiazem, GLIPIZIDE XL, and warfarin.  No orders of the defined types were placed in this encounter.     Follow-up: Return in about 3 months (around 02/24/2015).  Claretta Fraise, M.D.

## 2014-11-24 NOTE — Patient Instructions (Signed)
SeeTammy 2 months and see me the third mmonth.

## 2014-11-25 ENCOUNTER — Other Ambulatory Visit: Payer: Self-pay | Admitting: Family Medicine

## 2014-11-25 LAB — CMP14+EGFR
ALK PHOS: 93 IU/L (ref 39–117)
ALT: 29 IU/L (ref 0–32)
AST: 27 IU/L (ref 0–40)
Albumin/Globulin Ratio: 1.5 (ref 1.1–2.5)
Albumin: 4.5 g/dL (ref 3.5–4.8)
BILIRUBIN TOTAL: 0.3 mg/dL (ref 0.0–1.2)
BUN / CREAT RATIO: 23 (ref 11–26)
BUN: 29 mg/dL — ABNORMAL HIGH (ref 8–27)
CHLORIDE: 94 mmol/L — AB (ref 97–108)
CO2: 23 mmol/L (ref 18–29)
Calcium: 9.4 mg/dL (ref 8.7–10.3)
Creatinine, Ser: 1.27 mg/dL — ABNORMAL HIGH (ref 0.57–1.00)
GFR, EST AFRICAN AMERICAN: 48 mL/min/{1.73_m2} — AB (ref >59)
GFR, EST NON AFRICAN AMERICAN: 42 mL/min/{1.73_m2} — AB (ref >59)
GLOBULIN, TOTAL: 3.1 g/dL (ref 1.5–4.5)
GLUCOSE: 140 mg/dL — AB (ref 65–99)
POTASSIUM: 4.1 mmol/L (ref 3.5–5.2)
Sodium: 134 mmol/L (ref 134–144)
Total Protein: 7.6 g/dL (ref 6.0–8.5)

## 2014-11-25 LAB — VITAMIN D 25 HYDROXY (VIT D DEFICIENCY, FRACTURES): Vit D, 25-Hydroxy: 23.5 ng/mL — ABNORMAL LOW (ref 30.0–100.0)

## 2014-11-25 LAB — NMR, LIPOPROFILE
Cholesterol: 125 mg/dL (ref 100–199)
HDL Cholesterol by NMR: 40 mg/dL (ref >39)
HDL PARTICLE NUMBER: 31.7 umol/L (ref >=30.5)
LDL PARTICLE NUMBER: 589 nmol/L (ref <1000)
LDL SIZE: 19.7 nm (ref >20.5)
LDL-C: 47 mg/dL (ref 0–99)
LP-IR SCORE: 46 — AB (ref <=45)
SMALL LDL PARTICLE NUMBER: 499 nmol/L (ref <=527)
Triglycerides by NMR: 189 mg/dL — ABNORMAL HIGH (ref 0–149)

## 2014-11-25 LAB — THYROID PANEL WITH TSH
FREE THYROXINE INDEX: 2.2 (ref 1.2–4.9)
T3 UPTAKE RATIO: 31 % (ref 24–39)
T4, Total: 7.2 ug/dL (ref 4.5–12.0)
TSH: 7 u[IU]/mL — AB (ref 0.450–4.500)

## 2014-11-25 MED ORDER — LEVOTHYROXINE SODIUM 137 MCG PO TABS: 137.0000 ug | ORAL_TABLET | Freq: Every day | ORAL | Status: DC

## 2014-11-25 MED ORDER — VITAMIN D (ERGOCALCIFEROL) 1.25 MG (50000 UNIT) PO CAPS: 50000.0000 [IU] | ORAL_CAPSULE | ORAL | Status: DC

## 2014-12-04 ENCOUNTER — Other Ambulatory Visit: Payer: Self-pay | Admitting: Nurse Practitioner

## 2014-12-06 ENCOUNTER — Other Ambulatory Visit: Payer: Self-pay | Admitting: Family Medicine

## 2014-12-07 ENCOUNTER — Encounter: Payer: Self-pay | Admitting: Family Medicine

## 2014-12-09 ENCOUNTER — Encounter: Payer: Self-pay | Admitting: *Deleted

## 2014-12-09 ENCOUNTER — Other Ambulatory Visit: Payer: Self-pay | Admitting: Pharmacist

## 2014-12-11 ENCOUNTER — Ambulatory Visit (INDEPENDENT_AMBULATORY_CARE_PROVIDER_SITE_OTHER): Payer: Commercial Managed Care - HMO | Admitting: Family Medicine

## 2014-12-11 ENCOUNTER — Encounter: Payer: Self-pay | Admitting: Family Medicine

## 2014-12-11 VITALS — BP 117/79 | HR 97 | Temp 97.2°F | Ht 64.0 in | Wt 174.0 lb

## 2014-12-11 DIAGNOSIS — J209 Acute bronchitis, unspecified: Secondary | ICD-10-CM

## 2014-12-11 MED ORDER — AMOXICILLIN-POT CLAVULANATE 875-125 MG PO TABS: 1.0000 | ORAL_TABLET | Freq: Two times a day (BID) | ORAL | Status: DC

## 2014-12-11 MED ORDER — HYDROCODONE-HOMATROPINE 5-1.5 MG/5ML PO SYRP: 5.0000 mL | ORAL_SOLUTION | Freq: Four times a day (QID) | ORAL | Status: DC | PRN

## 2014-12-11 NOTE — Progress Notes (Signed)
Subjective:  Patient ID: Meredith Anderson, female    DOB: 02/04/1941  Age: 74 y.o. MRN: [id number]  CC: Cough   HPI Meredith Anderson presents for productive cough yellow green sputum. Increasing over the last 5 days. Denies shortness of breath. Overall moderate severity no fever chills or sweats.    History Meredith Anderson has a past medical history of Hypertension; Hyperlipidemia; Dyshydrosis; NIDDM (non-insulin dependent diabetes mellitus); CA - cancer of parotid gland; Menopause; Hemorrhoids; Arthritis; A-fib; Anxiety; Thyroid disease; Cataract; and Osteopenia (2016).   She has past surgical history that includes Tubal ligation; bunionectomy bilateral; Abdominal hysterectomy; Eye surgery (Bilateral); Ablation; Rotator cuff repair (Right); and Lumbar disc surgery.   Her family history includes Cancer in her sister, sister, and sister; Diabetes in her brother, mother, sister, sister, and sister; Heart disease in her brother and father; Hyperlipidemia in her brother, sister, and sister; Hypertension in her brother; Stroke in her mother.She reports that she has never smoked. She has never used smokeless tobacco. She reports that she does not drink alcohol or use illicit drugs.  Outpatient Prescriptions Prior to Visit  Medication Sig Dispense Refill  . ACCU-CHEK AVIVA PLUS test strip USE TO CHECK BLOOD SUGAR ONCE A DAY OR AS INSTRUCTED 50 each 3  . acetaminophen (TYLENOL) 500 MG tablet 500 mg. Take 500 mg by mouth every 6 (six) hours as needed for Pain.    . Cholecalciferol 1000 UNITS capsule Take 1,000 Units by mouth daily.    Marland Kitchen conjugated estrogens (PREMARIN) vaginal cream Place 1 g vaginally 2 (two) times a week. Place vaginally twice a week.    . CRESTOR 20 MG tablet TAKE ONE TABLET BY MOUTH ONE TIME DAILY 90 tablet 1  . diclofenac sodium (VOLTAREN) 1 % GEL Apply 1 to 2 grams to area of pain up to qid as needed 100 g 0  . diltiazem (CARDIZEM CD) 120 MG 24 hr capsule TAKE 1 CAPSULE BY MOUTH ONCE DAILY FOR  HEART RATE 90 capsule 0  . diphenhydrAMINE (BENADRYL) 25 mg capsule 25 mg.    . escitalopram (LEXAPRO) 10 MG tablet TAKE 1 TABLET BY MOUTH ONCE A DAY (TO REPLACE CELEXA, GENERIC CITALOPRAM) 90 tablet 0  . GLIPIZIDE XL 5 MG 24 hr tablet TAKE ONE TABLET BY MOUTH DAILY WITH BREAKFAST 30 tablet 5  . hydrOXYzine (ATARAX/VISTARIL) 25 MG tablet Take 1 tablet (25 mg total) by mouth 3 (three) times daily as needed. for anxiety 90 tablet 0  . levothyroxine (SYNTHROID, LEVOTHROID) 137 MCG tablet Take 1 tablet (137 mcg total) by mouth daily before breakfast. 30 tablet 5  . lisinopril-hydrochlorothiazide (PRINZIDE,ZESTORETIC) 20-25 MG per tablet TAKE  ONE TABLET BY MOUTH EVERY MORNING FOR BLOOD PRESSURE 90 tablet 1  . magnesium oxide (MAG-OX) 400 MG tablet 400 mg.    . PROCTOZONE-HC 2.5 % rectal cream APPLY TO RECTAL AREA EVERY 6 HOURS AS NEEDED FOR HEMORRHOIDS 30 g 2  . TETRAHYDROZOLINE HCL OP Apply to eye. Use as needed for redness     . warfarin (COUMADIN) 2 MG tablet TAKE 1 OR 2 TABLETS BY MOUTH DAILY AS INSTRUCTED BY ANTICOAGULATION CLINIC 120 tablet 1  . propafenone (RYTHMOL) 225 MG tablet TAKE 1 TABLET (225 MG TOTAL)  BY MOUTH EVERY 8 (EIGHT) HOURS. 270 tablet 1  . Vitamin D, Ergocalciferol, (DRISDOL) 50000 UNITS CAPS capsule Take 1 capsule (50,000 Units total) by mouth 2 (two) times a week. (Patient not taking: Reported on 12/11/2014) 16 capsule 0   No facility-administered medications  prior to visit.    ROS Review of Systems  Constitutional: Negative for fever, chills, activity change and appetite change.  HENT: Positive for congestion, postnasal drip, rhinorrhea and sinus pressure. Negative for ear discharge, ear pain, hearing loss, nosebleeds, sneezing and trouble swallowing.   Respiratory: Negative for chest tightness and shortness of breath.   Cardiovascular: Negative for chest pain and palpitations.  Skin: Negative for rash.    Objective:  BP 117/79 mmHg  Pulse 97  Temp(Src) 97.2 F  (36.2 C) (Oral)  Ht 5\' 4"  (1.626 m)  Wt 174 lb (78.926 kg)  BMI 29.85 kg/m2  BP Readings from Last 3 Encounters:  12/11/14 117/79  11/24/14 121/64  05/17/14 142/80    Wt Readings from Last 3 Encounters:  12/11/14 174 lb (78.926 kg)  11/24/14 172 lb 9.6 oz (78.291 kg)  08/11/14 166 lb (75.297 kg)     Physical Exam  Constitutional: She is oriented to person, place, and time. She appears well-developed and well-nourished. No distress.  HENT:  Head: Normocephalic and atraumatic.  Right Ear: External ear normal.  Left Ear: External ear normal.  Nose: Nose normal.  Mouth/Throat: Oropharynx is clear and moist.  Eyes: Conjunctivae and EOM are normal. Pupils are equal, round, and reactive to light.  Neck: Normal range of motion. Neck supple. No thyromegaly present.  Cardiovascular: Normal rate, regular rhythm and normal heart sounds.   No murmur heard. Pulmonary/Chest: Effort normal and breath sounds normal. No respiratory distress. She has no wheezes. She has no rales.  Abdominal: Soft. Bowel sounds are normal. She exhibits no distension. There is no tenderness.  Lymphadenopathy:    She has no cervical adenopathy.  Neurological: She is alert and oriented to person, place, and time. She has normal reflexes.  Skin: Skin is warm and dry.  Psychiatric: She has a normal mood and affect. Her behavior is normal. Judgment and thought content normal.    Lab Results  Component Value Date   HGBA1C 6.6 11/24/2014   HGBA1C 6.8 02/25/2014   HGBA1C 6.3 07/06/2013    Lab Results  Component Value Date   WBC 6.6 11/24/2014   HGB 12.0* 11/24/2014   HCT 38.8 11/24/2014   GLUCOSE 140* 11/24/2014   CHOL 125 11/24/2014   TRIG 189* 11/24/2014   HDL 40 11/24/2014   LDLCALC 50 02/25/2014   ALT 29 11/24/2014   AST 27 11/24/2014   NA 134 11/24/2014   K 4.1 11/24/2014   CL 94* 11/24/2014   CREATININE 1.27* 11/24/2014   BUN 29* 11/24/2014   CO2 23 11/24/2014   TSH 7.000* 11/24/2014   INR  2.3 11/08/2014   HGBA1C 6.6 11/24/2014    No results found.  Assessment & Plan:   Meredith Anderson was seen today for cough.  Diagnoses and all orders for this visit:  Acute bronchitis, unspecified organism  Other orders -     HYDROcodone-homatropine (HYCODAN) 5-1.5 MG/5ML syrup; Take 5 mLs by mouth every 6 (six) hours as needed for cough. -     amoxicillin-clavulanate (AUGMENTIN) 875-125 MG per tablet; Take 1 tablet by mouth 2 (two) times daily. Take all of this medication   I am having Meredith Anderson start on HYDROcodone-homatropine and amoxicillin-clavulanate. I am also having her maintain her TETRAHYDROZOLINE HCL OP, conjugated estrogens, acetaminophen, magnesium oxide, diphenhydrAMINE, PROCTOZONE-HC, Cholecalciferol, ACCU-CHEK AVIVA PLUS, diclofenac sodium, hydrOXYzine, propafenone, diltiazem, warfarin, Vitamin D (Ergocalciferol), levothyroxine, GLIPIZIDE XL, escitalopram, CRESTOR, and lisinopril-hydrochlorothiazide.  Meds ordered this encounter  Medications  . HYDROcodone-homatropine (HYCODAN) 5-1.5  MG/5ML syrup    Sig: Take 5 mLs by mouth every 6 (six) hours as needed for cough.    Dispense:  120 mL    Refill:  0  . amoxicillin-clavulanate (AUGMENTIN) 875-125 MG per tablet    Sig: Take 1 tablet by mouth 2 (two) times daily. Take all of this medication    Dispense:  20 tablet    Refill:  0     Follow-up: Return if symptoms worsen or fail to improve.  Claretta Fraise, M.D.

## 2014-12-27 ENCOUNTER — Encounter (INDEPENDENT_AMBULATORY_CARE_PROVIDER_SITE_OTHER): Payer: Self-pay

## 2014-12-27 ENCOUNTER — Ambulatory Visit (INDEPENDENT_AMBULATORY_CARE_PROVIDER_SITE_OTHER): Payer: Commercial Managed Care - HMO | Admitting: Pharmacist

## 2014-12-27 DIAGNOSIS — I482 Chronic atrial fibrillation, unspecified: Secondary | ICD-10-CM

## 2014-12-27 LAB — POCT INR: INR: 2.4

## 2014-12-27 NOTE — Patient Instructions (Signed)
Anticoagulation Dose Instructions as of 12/27/2014      Meredith Anderson Tue Wed Thu Fri Sat   New Dose 2 mg 4 mg 2 mg 4 mg 2 mg 4 mg 2 mg    Description        Continue warfarin dose of 4mg  or 2 tablets on mondays, wednesdays and fridays and 2mg  or 1 tablet Sundays, tuesdays, thursdays and saturdays.     INR was 2.4 today

## 2015-01-22 ENCOUNTER — Other Ambulatory Visit: Payer: Self-pay | Admitting: Family Medicine

## 2015-01-27 ENCOUNTER — Ambulatory Visit (INDEPENDENT_AMBULATORY_CARE_PROVIDER_SITE_OTHER): Payer: Commercial Managed Care - HMO | Admitting: Family Medicine

## 2015-01-27 ENCOUNTER — Encounter: Payer: Self-pay | Admitting: Family Medicine

## 2015-01-27 VITALS — BP 117/74 | HR 78 | Temp 97.3°F | Ht 63.0 in | Wt 169.0 lb

## 2015-01-27 DIAGNOSIS — E038 Other specified hypothyroidism: Secondary | ICD-10-CM | POA: Diagnosis not present

## 2015-01-27 DIAGNOSIS — E119 Type 2 diabetes mellitus without complications: Secondary | ICD-10-CM

## 2015-01-27 DIAGNOSIS — I482 Chronic atrial fibrillation, unspecified: Secondary | ICD-10-CM

## 2015-01-27 DIAGNOSIS — I10 Essential (primary) hypertension: Secondary | ICD-10-CM | POA: Diagnosis not present

## 2015-01-27 LAB — POCT INR: INR: 2

## 2015-01-27 MED ORDER — VITAMIN D 50 MCG (2000 UT) PO TABS: 2000.0000 [IU] | ORAL_TABLET | Freq: Every day | ORAL | Status: DC

## 2015-01-27 NOTE — Progress Notes (Signed)
Subjective:  Patient ID: Meredith Anderson, female    DOB: 1940/10/01  Age: 74 y.o. MRN: [id number]  CC: Hypothyroidism   HPI PRUDIE GUTHRIDGE presents for Patient presents for follow-up on  thyroid. She has a history of hypothyroidism for many years. It has been stable recently. Pt. denies any change in  voice, loss of hair, heat or cold intolerance. Energy level has been adequate to good. She denies constipation and diarrhea. No myxedema. Medication is as noted below. Verified that pt is taking it daily on an empty stomach. Well tolerated.   follow-up of hypertension. Patient has no history of headache chest pain or shortness of breath or recent cough. Patient also denies symptoms of TIA such as numbness weakness lateralizing. Patient checks  blood pressure at home and has not had any elevated readings recently. Patient denies side effects from his medication. States taking it regularly.  Follow-up of diabetes. Patient does check blood sugar at home. Log not returned. Says low 100s are typical, but occasionally up to 200. Patient denies symptoms such as polyuria, polydipsia, excessive hunger, nausea No significant hypoglycemic spells noted. Medications as noted below. Taking them regularly without complication/adverse reaction being reported today.   History Shirlee has a past medical history of Hypertension; Hyperlipidemia; Dyshydrosis; NIDDM (non-insulin dependent diabetes mellitus); CA - cancer of parotid gland; Menopause; Hemorrhoids; Arthritis; A-fib; Anxiety; Thyroid disease; Cataract; and Osteopenia (2016).   She has past surgical history that includes Tubal ligation; bunionectomy bilateral; Abdominal hysterectomy; Eye surgery (Bilateral); Ablation; Rotator cuff repair (Right); and Lumbar disc surgery.   Her family history includes Cancer in her sister, sister, and sister; Diabetes in her brother, mother, sister, sister, and sister; Heart disease in her brother and father; Hyperlipidemia in her  brother, sister, and sister; Hypertension in her brother; Stroke in her mother.She reports that she has never smoked. She has never used smokeless tobacco. She reports that she does not drink alcohol or use illicit drugs.  Outpatient Prescriptions Prior to Visit  Medication Sig Dispense Refill  . ACCU-CHEK AVIVA PLUS test strip USE TO CHECK BLOOD SUGAR ONCE A DAY OR AS INSTRUCTED 50 each 3  . acetaminophen (TYLENOL) 500 MG tablet 500 mg. Take 500 mg by mouth every 6 (six) hours as needed for Pain.    . bismuth subsalicylate (PEPTO BISMOL) 262 MG chewable tablet     . conjugated estrogens (PREMARIN) vaginal cream Place 1 g vaginally 2 (two) times a week. Place vaginally twice a week.    . CRESTOR 20 MG tablet TAKE ONE TABLET BY MOUTH ONE TIME DAILY 90 tablet 1  . diclofenac sodium (VOLTAREN) 1 % GEL Apply 1 to 2 grams to area of pain up to qid as needed 100 g 0  . diltiazem (CARDIZEM CD) 120 MG 24 hr capsule TAKE 1 CAPSULE BY MOUTH ONCE  DAILY FOR HEART RATE 90 capsule 0  . diphenhydrAMINE (BENADRYL) 25 mg capsule 25 mg.    . escitalopram (LEXAPRO) 10 MG tablet TAKE 1 TABLET BY MOUTH ONCE A DAY (TO REPLACE CELEXA, GENERIC CITALOPRAM) 90 tablet 0  . GLIPIZIDE XL 5 MG 24 hr tablet TAKE ONE TABLET BY MOUTH DAILY WITH BREAKFAST 30 tablet 5  . levothyroxine (SYNTHROID, LEVOTHROID) 137 MCG tablet Take 1 tablet (137 mcg total) by mouth daily before breakfast. 30 tablet 5  . lisinopril-hydrochlorothiazide (PRINZIDE,ZESTORETIC) 20-25 MG per tablet TAKE  ONE TABLET BY MOUTH EVERY MORNING FOR BLOOD PRESSURE 90 tablet 1  . magnesium oxide (MAG-OX) 400  MG tablet 400 mg.    . PROCTOZONE-HC 2.5 % rectal cream APPLY TO RECTAL AREA EVERY 6 HOURS AS NEEDED FOR HEMORRHOIDS 30 g 2  . propafenone (RYTHMOL) 225 MG tablet TAKE 1 TABLET (225 MG TOTAL)  BY MOUTH EVERY 8 (EIGHT) HOURS. 270 tablet 1  . TETRAHYDROZOLINE HCL OP Apply to eye. Use as needed for redness     . warfarin (COUMADIN) 2 MG tablet TAKE 1 OR 2 TABLETS  BY MOUTH DAILY AS INSTRUCTED BY ANTICOAGULATION CLINIC 120 tablet 1  . Cholecalciferol 1000 UNITS capsule Take 1,000 Units by mouth daily.    Marland Kitchen HYDROcodone-homatropine (HYCODAN) 5-1.5 MG/5ML syrup Take 5 mLs by mouth every 6 (six) hours as needed for cough. (Patient not taking: Reported on 12/27/2014) 120 mL 0  . Vitamin D, Ergocalciferol, (DRISDOL) 50000 UNITS CAPS capsule Take 1 capsule (50,000 Units total) by mouth 2 (two) times a week. (Patient not taking: Reported on 12/11/2014) 16 capsule 0   No facility-administered medications prior to visit.    ROS Review of Systems  Constitutional: Negative for fever, chills, diaphoresis, appetite change, fatigue and unexpected weight change.  HENT: Negative for congestion, ear pain, hearing loss, postnasal drip, rhinorrhea, sneezing, sore throat and trouble swallowing.   Eyes: Negative for pain.  Respiratory: Negative for cough, chest tightness and shortness of breath.   Cardiovascular: Negative for chest pain and palpitations.  Gastrointestinal: Negative for nausea, vomiting, abdominal pain, diarrhea and constipation.  Genitourinary: Negative for dysuria, frequency and menstrual problem.  Musculoskeletal: Negative for joint swelling and arthralgias.  Skin: Negative for rash.  Neurological: Negative for dizziness, weakness, numbness and headaches.  Psychiatric/Behavioral: Negative for dysphoric mood and agitation.    Objective:  BP 117/74 mmHg  Pulse 78  Temp(Src) 97.3 F (36.3 C) (Oral)  Ht 5\' 3"  (1.6 m)  Wt 169 lb (76.658 kg)  BMI 29.94 kg/m2  BP Readings from Last 3 Encounters:  01/27/15 117/74  12/11/14 117/79  11/24/14 121/64    Wt Readings from Last 3 Encounters:  01/27/15 169 lb (76.658 kg)  12/11/14 174 lb (78.926 kg)  11/24/14 172 lb 9.6 oz (78.291 kg)     Physical Exam  Constitutional: She is oriented to person, place, and time. She appears well-developed and well-nourished. No distress.  HENT:  Head: Normocephalic  and atraumatic.  Right Ear: External ear normal.  Left Ear: External ear normal.  Nose: Nose normal.  Mouth/Throat: Oropharynx is clear and moist.  Eyes: Conjunctivae and EOM are normal. Pupils are equal, round, and reactive to light.  Neck: Normal range of motion. Neck supple. No thyromegaly present.  Cardiovascular: Normal rate, regular rhythm and normal heart sounds.   No murmur heard. Pulmonary/Chest: Effort normal and breath sounds normal. No respiratory distress. She has no wheezes. She has no rales.  Abdominal: Soft. Bowel sounds are normal. She exhibits no distension. There is no tenderness.  Lymphadenopathy:    She has no cervical adenopathy.  Neurological: She is alert and oriented to person, place, and time. She has normal reflexes.  Skin: Skin is warm and dry.  Psychiatric: She has a normal mood and affect. Her behavior is normal. Judgment and thought content normal.    Lab Results  Component Value Date   HGBA1C 6.6 11/24/2014   HGBA1C 6.8 02/25/2014   HGBA1C 6.3 07/06/2013    Lab Results  Component Value Date   WBC 6.6 11/24/2014   HGB 12.0* 11/24/2014   HCT 38.8 11/24/2014   GLUCOSE 140* 11/24/2014  CHOL 125 11/24/2014   TRIG 189* 11/24/2014   HDL 40 11/24/2014   LDLCALC 50 02/25/2014   ALT 29 11/24/2014   AST 27 11/24/2014   NA 134 11/24/2014   K 4.1 11/24/2014   CL 94* 11/24/2014   CREATININE 1.27* 11/24/2014   BUN 29* 11/24/2014   CO2 23 11/24/2014   TSH 3.230 01/27/2015   INR 2.0 01/27/2015   HGBA1C 6.6 11/24/2014    No results found.  Assessment & Plan:   Lively was seen today for hypothyroidism.  Diagnoses and all orders for this visit:  Other specified hypothyroidism Orders: -     Thyroid Panel With TSH  Chronic atrial fibrillation Orders: -     POCT INR  Essential hypertension, benign  Type 2 diabetes mellitus without complication  Other orders -     Cholecalciferol (VITAMIN D) 2000 UNITS tablet; Take 1 tablet (2,000 Units  total) by mouth daily.   I have discontinued Ms. Wasko's Cholecalciferol, Vitamin D (Ergocalciferol), and HYDROcodone-homatropine. I am also having her start on Vitamin D. Additionally, I am having her maintain her TETRAHYDROZOLINE HCL OP, conjugated estrogens, acetaminophen, magnesium oxide, diphenhydrAMINE, PROCTOZONE-HC, ACCU-CHEK AVIVA PLUS, diclofenac sodium, propafenone, warfarin, levothyroxine, GLIPIZIDE XL, escitalopram, CRESTOR, lisinopril-hydrochlorothiazide, bismuth subsalicylate, and diltiazem.  Meds ordered this encounter  Medications  . Cholecalciferol (VITAMIN D) 2000 UNITS tablet    Sig: Take 1 tablet (2,000 Units total) by mouth daily.    Dispense:  30 tablet    Refill:  11     Follow-up: Return in about 3 months (around 04/29/2015) for diabetes, Hypothyroidism.  Claretta Fraise, M.D.

## 2015-01-28 ENCOUNTER — Telehealth: Payer: Self-pay | Admitting: *Deleted

## 2015-01-28 LAB — THYROID PANEL WITH TSH
Free Thyroxine Index: 2.1 (ref 1.2–4.9)
T3 UPTAKE RATIO: 29 % (ref 24–39)
T4, Total: 7.1 ug/dL (ref 4.5–12.0)
TSH: 3.23 u[IU]/mL (ref 0.450–4.500)

## 2015-01-28 NOTE — Telephone Encounter (Signed)
-----   Message from Claretta Fraise, MD sent at 01/28/2015  8:03 AM EDT ----- Alfonso Ellis,    Your lab result is normal.Some minor variations that are not significant are commonly marked abnormal, but do not represent any medical problem for you.  Best regards, Claretta Fraise, M.D.

## 2015-01-28 NOTE — Telephone Encounter (Signed)
Left message with normal results

## 2015-02-10 ENCOUNTER — Other Ambulatory Visit: Payer: Self-pay

## 2015-02-10 MED ORDER — HYDROCORTISONE 2.5 % RE CREA: TOPICAL_CREAM | RECTAL | Status: DC

## 2015-02-25 ENCOUNTER — Ambulatory Visit: Payer: Commercial Managed Care - HMO | Admitting: Family Medicine

## 2015-03-04 ENCOUNTER — Other Ambulatory Visit: Payer: Self-pay | Admitting: Family Medicine

## 2015-03-10 ENCOUNTER — Ambulatory Visit (INDEPENDENT_AMBULATORY_CARE_PROVIDER_SITE_OTHER): Payer: Commercial Managed Care - HMO | Admitting: Pharmacist

## 2015-03-10 DIAGNOSIS — I482 Chronic atrial fibrillation, unspecified: Secondary | ICD-10-CM

## 2015-03-10 LAB — POCT INR: INR: 2.7

## 2015-03-10 NOTE — Patient Instructions (Signed)
Anticoagulation Dose Instructions as of 03/10/2015      Dorene Grebe Tue Wed Thu Fri Sat   New Dose 2 mg 4 mg 2 mg 4 mg 2 mg 4 mg 2 mg    Description        Continue warfarin dose of 4mg  or 2 tablets on mondays, wednesdays and fridays and 2mg  or 1 tablet Sundays, tuesdays, thursdays and saturdays.     INR was 2.7 today.

## 2015-04-15 ENCOUNTER — Other Ambulatory Visit: Payer: Self-pay | Admitting: Nurse Practitioner

## 2015-04-23 ENCOUNTER — Other Ambulatory Visit: Payer: Self-pay | Admitting: Family Medicine

## 2015-04-24 ENCOUNTER — Other Ambulatory Visit: Payer: Self-pay | Admitting: Pharmacist

## 2015-04-29 ENCOUNTER — Encounter: Payer: Self-pay | Admitting: Family Medicine

## 2015-04-29 ENCOUNTER — Ambulatory Visit (INDEPENDENT_AMBULATORY_CARE_PROVIDER_SITE_OTHER): Payer: Commercial Managed Care - HMO | Admitting: Family Medicine

## 2015-04-29 VITALS — BP 111/66 | HR 60 | Temp 97.0°F | Ht 63.0 in | Wt 170.0 lb

## 2015-04-29 DIAGNOSIS — I482 Chronic atrial fibrillation, unspecified: Secondary | ICD-10-CM

## 2015-04-29 DIAGNOSIS — Z7901 Long term (current) use of anticoagulants: Secondary | ICD-10-CM

## 2015-04-29 DIAGNOSIS — I10 Essential (primary) hypertension: Secondary | ICD-10-CM | POA: Diagnosis not present

## 2015-04-29 DIAGNOSIS — E119 Type 2 diabetes mellitus without complications: Secondary | ICD-10-CM

## 2015-04-29 DIAGNOSIS — E782 Mixed hyperlipidemia: Secondary | ICD-10-CM | POA: Diagnosis not present

## 2015-04-29 LAB — POCT INR: INR: 2.5

## 2015-04-29 LAB — POCT GLYCOSYLATED HEMOGLOBIN (HGB A1C): HEMOGLOBIN A1C: 6.9

## 2015-04-29 MED ORDER — PIOGLITAZONE HCL 15 MG PO TABS: 15.0000 mg | ORAL_TABLET | Freq: Every day | ORAL | Status: DC

## 2015-04-29 MED ORDER — ESCITALOPRAM OXALATE 10 MG PO TABS: ORAL_TABLET | ORAL | Status: DC

## 2015-04-29 MED ORDER — DILTIAZEM HCL ER COATED BEADS 120 MG PO CP24: ORAL_CAPSULE | ORAL | Status: DC

## 2015-04-29 MED ORDER — DICLOFENAC SODIUM 1 % TD GEL: TRANSDERMAL | Status: DC

## 2015-04-29 MED ORDER — GLIPIZIDE ER 5 MG PO TB24: 5.0000 mg | ORAL_TABLET | Freq: Every day | ORAL | Status: DC

## 2015-04-29 MED ORDER — LEVOTHYROXINE SODIUM 137 MCG PO TABS: 137.0000 ug | ORAL_TABLET | Freq: Every day | ORAL | Status: DC

## 2015-04-29 MED ORDER — ROSUVASTATIN CALCIUM 20 MG PO TABS: 20.0000 mg | ORAL_TABLET | Freq: Every day | ORAL | Status: DC

## 2015-04-29 MED ORDER — PROPAFENONE HCL 225 MG PO TABS: ORAL_TABLET | ORAL | Status: DC

## 2015-04-29 MED ORDER — LISINOPRIL-HYDROCHLOROTHIAZIDE 20-25 MG PO TABS: ORAL_TABLET | ORAL | Status: DC

## 2015-04-29 NOTE — Patient Instructions (Addendum)
Please start checking your blood glucose daily before you eat anything which is for your fasting level. Also check daily 2 hours after your biggest meal of the day. This is to measure her bodies response to your carbohydrate intake  Start right away taking the new medication Actos/rosiglitazone daily. After you have been taking it for 6 weeks you should be able to stop taking the glipizide. If you start noticing her sugar dropping too low. You can stop taking the glipizide sooner.

## 2015-04-29 NOTE — Progress Notes (Signed)
Subjective:  Patient ID: Meredith Anderson, female    DOB: 06/19/1941  Age: 74 y.o. MRN: [id number]  CC: Diabetes; Hyperlipidemia; Hypertension; Atrial Fibrillation; and Hypothyroidism   HPI Meredith Anderson presents for  follow-up of hypertension. Patient has no history of headache chest pain or shortness of breath or recent cough. Patient also denies symptoms of TIA such as numbness weakness lateralizing. Patient checks  blood pressure at home and has not had any elevated readings recently. Patient denies side effects from his medication. States taking it regularly.  Patient also  in for follow-up of elevated cholesterol. Doing well without complaints on current medication. Denies side effects of statin including myalgia and arthralgia and nausea. Also in today for liver function testing. Currently no chest pain, shortness of breath or other cardiovascular related symptoms noted.  Follow-up of diabetes. Patient does not check blood sugar at home. Patient denies symptoms such as polyuria, polydipsia, excessive hunger, nausea Occasional hypoglycemic spells noted. Medications as noted below. Taking them regularly without complication/adverse reaction being reported today.    History Meredith Anderson has a past medical history of Hypertension; Hyperlipidemia; Dyshydrosis; NIDDM (non-insulin dependent diabetes mellitus); CA - cancer of parotid gland; Menopause; Hemorrhoids; Arthritis; A-fib ([place]); Anxiety; Thyroid disease; Cataract; and Osteopenia (2016).   She has past surgical history that includes Tubal ligation; bunionectomy bilateral; Abdominal hysterectomy; Eye surgery (Bilateral); Ablation; Rotator cuff repair (Right); and Lumbar disc surgery.   Her family history includes Cancer in her sister, sister, and sister; Diabetes in her brother, mother, sister, sister, and sister; Heart disease in her brother and father; Hyperlipidemia in her brother, sister, and sister; Hypertension in her brother; Stroke in her  mother.She reports that she has never smoked. She has never used smokeless tobacco. She reports that she does not drink alcohol or use illicit drugs.  Current Outpatient Prescriptions on File Prior to Visit  Medication Sig Dispense Refill  . ACCU-CHEK AVIVA PLUS test strip USE TO CHECK BLOOD SUGAR ONCE A DAY OR AS INSTRUCTED 50 each 3  . acetaminophen (TYLENOL) 500 MG tablet 500 mg. Take 500 mg by mouth every 6 (six) hours as needed for Pain.    . bismuth subsalicylate (PEPTO BISMOL) 262 MG chewable tablet     . cholecalciferol (VITAMIN D) 1000 UNITS tablet Take 1,000 Units by mouth daily.    Marland Kitchen conjugated estrogens (PREMARIN) vaginal cream Place 1 g vaginally 2 (two) times a week. Place vaginally twice a week.    . CRESTOR 20 MG tablet TAKE ONE TABLET BY MOUTH ONE TIME DAILY 90 tablet 1  . diclofenac sodium (VOLTAREN) 1 % GEL Apply 1 to 2 grams to area of pain up to qid as needed 100 g 0  . diltiazem (CARDIZEM CD) 120 MG 24 hr capsule TAKE 1 CAPSULE BY MOUTH ONCE   DAILY FOR HEART RATE 90 capsule 0  . diphenhydrAMINE (BENADRYL) 25 mg capsule 25 mg.    . escitalopram (LEXAPRO) 10 MG tablet TAKE 1 TABLET BY MOUTH ONCE A DAY (TO REPLACE CELEXA, GENERIC CITALOPRAM) 90 tablet 0  . GLIPIZIDE XL 5 MG 24 hr tablet TAKE ONE TABLET BY MOUTH DAILY WITH BREAKFAST 30 tablet 5  . hydrocortisone (PROCTOZONE-HC) 2.5 % rectal cream APPLY TO RECTAL AREA EVERY 6 HOURS AS NEEDED FOR HEMORRHOIDS 30 g 2  . levothyroxine (SYNTHROID, LEVOTHROID) 137 MCG tablet Take 1 tablet (137 mcg total) by mouth daily before breakfast. 30 tablet 5  . lisinopril-hydrochlorothiazide (PRINZIDE,ZESTORETIC) 20-25 MG per tablet TAKE  ONE  TABLET BY MOUTH EVERY MORNING FOR BLOOD PRESSURE 90 tablet 1  . magnesium oxide (MAG-OX) 400 MG tablet 400 mg.    . propafenone (RYTHMOL) 225 MG tablet TAKE ONE TABLET BY MOUTH EVERY EIGHT HOURS 270 tablet 0  . TETRAHYDROZOLINE HCL OP Apply to eye. Use as needed for redness     . warfarin (COUMADIN) 2 MG  tablet TAKE 1 OR 2 TABLETS BY MOUTH ONCE A DAY AS INSTRUCTED BY ANTICOAGULATION CLINIC 120 tablet 1   No current facility-administered medications on file prior to visit.    ROS Review of Systems  Constitutional: Negative for fever, chills, diaphoresis, appetite change, fatigue and unexpected weight change.  HENT: Negative for congestion, ear pain, hearing loss, postnasal drip, rhinorrhea, sneezing, sore throat and trouble swallowing.   Eyes: Negative for pain.  Respiratory: Negative for cough, chest tightness and shortness of breath.   Cardiovascular: Negative for chest pain and palpitations.  Gastrointestinal: Negative for nausea, vomiting, abdominal pain, diarrhea and constipation.  Genitourinary: Negative for dysuria, frequency and menstrual problem.  Musculoskeletal: Negative for joint swelling and arthralgias.  Skin: Negative for rash.  Neurological: Negative for dizziness, weakness, numbness and headaches.  Psychiatric/Behavioral: Negative for dysphoric mood and agitation.    Objective:  BP 111/66 mmHg  Pulse 60  Temp(Src) 97 F (36.1 C) (Oral)  Ht 5' 3"  (1.6 m)  Wt 170 lb (77.111 kg)  BMI 30.12 kg/m2  BP Readings from Last 3 Encounters:  04/29/15 111/66  01/27/15 117/74  12/11/14 117/79    Wt Readings from Last 3 Encounters:  04/29/15 170 lb (77.111 kg)  01/27/15 169 lb (76.658 kg)  12/11/14 174 lb (78.926 kg)     Physical Exam  Constitutional: She is oriented to person, place, and time. She appears well-developed and well-nourished. No distress.  HENT:  Head: Normocephalic and atraumatic.  Right Ear: External ear normal.  Left Ear: External ear normal.  Nose: Nose normal.  Mouth/Throat: Oropharynx is clear and moist.  Eyes: Conjunctivae and EOM are normal. Pupils are equal, round, and reactive to light.  Neck: Normal range of motion. Neck supple. No thyromegaly present.  Cardiovascular: Normal rate, regular rhythm and normal heart sounds.   No murmur  heard. Pulmonary/Chest: Effort normal and breath sounds normal. No respiratory distress. She has no wheezes. She has no rales.  Abdominal: Soft. Bowel sounds are normal. She exhibits no distension. There is no tenderness.  Lymphadenopathy:    She has no cervical adenopathy.  Neurological: She is alert and oriented to person, place, and time. She has normal reflexes.  Skin: Skin is warm and dry.  Psychiatric: She has a normal mood and affect. Her behavior is normal. Judgment and thought content normal.    Lab Results  Component Value Date   HGBA1C 6.9 04/29/2015   HGBA1C 6.6 11/24/2014   HGBA1C 6.8 02/25/2014    Lab Results  Component Value Date   WBC 6.6 11/24/2014   HGB 12.0* 11/24/2014   HCT 38.8 11/24/2014   GLUCOSE 140* 11/24/2014   CHOL 125 11/24/2014   TRIG 189* 11/24/2014   HDL 40 11/24/2014   LDLCALC 50 02/25/2014   ALT 29 11/24/2014   AST 27 11/24/2014   NA 134 11/24/2014   K 4.1 11/24/2014   CL 94* 11/24/2014   CREATININE 1.27* 11/24/2014   BUN 29* 11/24/2014   CO2 23 11/24/2014   TSH 3.230 01/27/2015   INR 2.5 04/29/2015   HGBA1C 6.9 04/29/2015    No results found.  Assessment & Plan:   Meredith Anderson was seen today for diabetes, hyperlipidemia, hypertension, atrial fibrillation and hypothyroidism.  Diagnoses and all orders for this visit:  Long term (current) use of anticoagulants  Chronic atrial fibrillation ([place]) -     POCT INR -     CBC with Differential/Platelet  Essential hypertension, benign -     Cancel: POCT CBC -     POCT glycosylated hemoglobin (Hb A1C) -     CMP14+EGFR -     CBC with Differential/Platelet  Elevated cholesterol with high triglycerides -     POCT glycosylated hemoglobin (Hb A1C) -     CMP14+EGFR -     Lipid panel -     CBC with Differential/Platelet  Type 2 diabetes mellitus without complication, without long-term current use of insulin (HCC) -     POCT glycosylated hemoglobin (Hb A1C) -     CMP14+EGFR -     CBC with  Differential/Platelet  Other orders -     rosuvastatin (CRESTOR) 20 MG tablet; Take 1 tablet (20 mg total) by mouth daily. -     diclofenac sodium (VOLTAREN) 1 % GEL; Apply 1 to 2 grams to area of pain up to qid as needed -     diltiazem (CARDIZEM CD) 120 MG 24 hr capsule; TAKE 1 CAPSULE BY MOUTH ONCE   DAILY FOR HEART RATE -     escitalopram (LEXAPRO) 10 MG tablet; TAKE 1 TABLET BY MOUTH ONCE A DAY (TO REPLACE CELEXA, GENERIC CITALOPRAM) -     glipiZIDE (GLIPIZIDE XL) 5 MG 24 hr tablet; Take 1 tablet (5 mg total) by mouth daily with breakfast. -     levothyroxine (SYNTHROID, LEVOTHROID) 137 MCG tablet; Take 1 tablet (137 mcg total) by mouth daily before breakfast. -     lisinopril-hydrochlorothiazide (PRINZIDE,ZESTORETIC) 20-25 MG tablet; TAKE  ONE TABLET BY MOUTH EVERY MORNING FOR BLOOD PRESSURE -     propafenone (RYTHMOL) 225 MG tablet; TAKE ONE TABLET BY MOUTH EVERY EIGHT HOURS   I am having Meredith Anderson maintain her TETRAHYDROZOLINE HCL OP, conjugated estrogens, acetaminophen, magnesium oxide, diphenhydrAMINE, ACCU-CHEK AVIVA PLUS, diclofenac sodium, levothyroxine, GLIPIZIDE XL, CRESTOR, lisinopril-hydrochlorothiazide, bismuth subsalicylate, hydrocortisone, escitalopram, cholecalciferol, propafenone, diltiazem, and warfarin.  No orders of the defined types were placed in this encounter.   Please start checking your blood glucose daily before you eat anything which is for your fasting level. Also check daily 2 hours after your biggest meal of the day. This is to measure her bodies response to your carbohydrate intake  Start right away taking the new medication Actos/rosiglitazone daily. After you have been taking it for 6 weeks you should be able to stop taking the glipizide. If you start noticing her sugar dropping too low. You can stop taking the glipizide sooner.  Follow-up: Return in about 3 months (around 07/30/2015) for diabetes, hypertension, Hypothyroidism, cholesterol.  Claretta Fraise, M.D.

## 2015-04-30 LAB — CBC WITH DIFFERENTIAL/PLATELET
BASOS ABS: 0 10*3/uL (ref 0.0–0.2)
Basos: 1 %
EOS (ABSOLUTE): 0.1 10*3/uL (ref 0.0–0.4)
Eos: 3 %
HEMOGLOBIN: 12.1 g/dL (ref 11.1–15.9)
Hematocrit: 36.1 % (ref 34.0–46.6)
Immature Grans (Abs): 0 10*3/uL (ref 0.0–0.1)
Immature Granulocytes: 0 %
LYMPHS ABS: 1.2 10*3/uL (ref 0.7–3.1)
Lymphs: 22 %
MCH: 30.4 pg (ref 26.6–33.0)
MCHC: 33.5 g/dL (ref 31.5–35.7)
MCV: 91 fL (ref 79–97)
MONOCYTES: 10 %
MONOS ABS: 0.6 10*3/uL (ref 0.1–0.9)
NEUTROS ABS: 3.6 10*3/uL (ref 1.4–7.0)
Neutrophils: 64 %
Platelets: 281 10*3/uL (ref 150–379)
RBC: 3.98 x10E6/uL (ref 3.77–5.28)
RDW: 13.2 % (ref 12.3–15.4)
WBC: 5.6 10*3/uL (ref 3.4–10.8)

## 2015-04-30 LAB — LIPID PANEL
CHOL/HDL RATIO: 3.3 ratio (ref 0.0–4.4)
Cholesterol, Total: 115 mg/dL (ref 100–199)
HDL: 35 mg/dL — AB (ref >39)
LDL CALC: 31 mg/dL (ref 0–99)
TRIGLYCERIDES: 247 mg/dL — AB (ref 0–149)
VLDL CHOLESTEROL CAL: 49 mg/dL — AB (ref 5–40)

## 2015-04-30 LAB — CMP14+EGFR
A/G RATIO: 1.7 (ref 1.1–2.5)
ALT: 27 IU/L (ref 0–32)
AST: 26 IU/L (ref 0–40)
Albumin: 4.7 g/dL (ref 3.5–4.8)
Alkaline Phosphatase: 85 IU/L (ref 39–117)
BUN/Creatinine Ratio: 21 (ref 11–26)
BUN: 24 mg/dL (ref 8–27)
Bilirubin Total: 0.4 mg/dL (ref 0.0–1.2)
CALCIUM: 9.9 mg/dL (ref 8.7–10.3)
CO2: 24 mmol/L (ref 18–29)
CREATININE: 1.13 mg/dL — AB (ref 0.57–1.00)
Chloride: 93 mmol/L — ABNORMAL LOW (ref 97–106)
GFR, EST AFRICAN AMERICAN: 56 mL/min/{1.73_m2} — AB (ref >59)
GFR, EST NON AFRICAN AMERICAN: 48 mL/min/{1.73_m2} — AB (ref >59)
GLUCOSE: 164 mg/dL — AB (ref 65–99)
Globulin, Total: 2.8 g/dL (ref 1.5–4.5)
POTASSIUM: 4.1 mmol/L (ref 3.5–5.2)
Sodium: 133 mmol/L — ABNORMAL LOW (ref 136–144)
Total Protein: 7.5 g/dL (ref 6.0–8.5)

## 2015-05-20 ENCOUNTER — Ambulatory Visit (INDEPENDENT_AMBULATORY_CARE_PROVIDER_SITE_OTHER): Payer: Commercial Managed Care - HMO | Admitting: Pharmacist

## 2015-05-20 ENCOUNTER — Encounter: Payer: Self-pay | Admitting: Pharmacist

## 2015-05-20 VITALS — HR 68 | Ht 64.25 in | Wt 174.5 lb

## 2015-05-20 DIAGNOSIS — Z7901 Long term (current) use of anticoagulants: Secondary | ICD-10-CM | POA: Diagnosis not present

## 2015-05-20 DIAGNOSIS — Z0000 Encounter for general adult medical examination without abnormal findings: Secondary | ICD-10-CM

## 2015-05-20 DIAGNOSIS — E119 Type 2 diabetes mellitus without complications: Secondary | ICD-10-CM

## 2015-05-20 DIAGNOSIS — I482 Chronic atrial fibrillation, unspecified: Secondary | ICD-10-CM

## 2015-05-20 LAB — POCT INR: INR: 1.8

## 2015-05-20 NOTE — Progress Notes (Addendum)
Patient ID: Meredith Anderson, female   DOB: 1940-08-22, 74 y.o.   MRN: [id number]    Subjective:   Meredith Anderson is a 74 y.o. female who presents for a SUBSEQUENT Medicare Annual Wellness Visit and Protime check. She arrived in a good mood and was curious about her previous lab results. Pt reported good adherence to all current medications.   Last A1c: 6.9  Current Medications (verified) Outpatient Encounter Prescriptions as of 05/20/2015  Medication Sig  . ACCU-CHEK AVIVA PLUS test strip USE TO CHECK BLOOD SUGAR ONCE A DAY OR AS INSTRUCTED  . acetaminophen (TYLENOL) 500 MG tablet 500 mg. Take 500 mg by mouth every 6 (six) hours as needed for Pain.  . bismuth subsalicylate (PEPTO BISMOL) 262 MG chewable tablet as needed.   . cholecalciferol (VITAMIN D) 1000 UNITS tablet Take 1,000 Units by mouth daily.  Marland Kitchen conjugated estrogens (PREMARIN) vaginal cream Place 1 g vaginally 2 (two) times a week. Place vaginally twice a week.  . diclofenac sodium (VOLTAREN) 1 % GEL Apply 1 to 2 grams to area of pain up to qid as needed  . diltiazem (CARDIZEM CD) 120 MG 24 hr capsule TAKE 1 CAPSULE BY MOUTH ONCE   DAILY FOR HEART RATE  . diphenhydrAMINE (BENADRYL) 25 mg capsule 25 mg as needed.   Marland Kitchen escitalopram (LEXAPRO) 10 MG tablet TAKE 1 TABLET BY MOUTH ONCE A DAY (TO REPLACE CELEXA, GENERIC CITALOPRAM) (Patient taking differently: TAKE 1 TABLET BY MOUTH ONCE A DAY (TO REPLACE CELEXA, GENERIC CITALOPRAM)  Takes 1/2 tablet at night)  . glipiZIDE (GLIPIZIDE XL) 5 MG 24 hr tablet Take 1 tablet (5 mg total) by mouth daily with breakfast. (Patient taking differently: Take 5 mg by mouth daily with breakfast. Taking 1 tablet every other day)  . hydrocortisone (PROCTOZONE-HC) 2.5 % rectal cream APPLY TO RECTAL AREA EVERY 6 HOURS AS NEEDED FOR HEMORRHOIDS  . levothyroxine (SYNTHROID, LEVOTHROID) 137 MCG tablet Take 1 tablet (137 mcg total) by mouth daily before breakfast.  . lisinopril-hydrochlorothiazide  (PRINZIDE,ZESTORETIC) 20-25 MG tablet TAKE  ONE TABLET BY MOUTH EVERY MORNING FOR BLOOD PRESSURE  . magnesium oxide (MAG-OX) 400 MG tablet 400 mg.  . pioglitazone (ACTOS) 15 MG tablet Take 1 tablet (15 mg total) by mouth daily.  . propafenone (RYTHMOL) 225 MG tablet TAKE ONE TABLET BY MOUTH EVERY EIGHT HOURS  . rosuvastatin (CRESTOR) 20 MG tablet Take 1 tablet (20 mg total) by mouth daily.  . TETRAHYDROZOLINE HCL OP Apply to eye. Use as needed for redness   . warfarin (COUMADIN) 2 MG tablet TAKE 1 OR 2 TABLETS BY MOUTH ONCE A DAY AS INSTRUCTED BY ANTICOAGULATION CLINIC   No facility-administered encounter medications on file as of 05/20/2015.    Allergies (verified) Amlodipine; Dronedarone; Glipizide-metformin hcl; Metformin; Metformin and related; and Niaspan   History: Past Medical History  Diagnosis Date  . Hypertension   . Hyperlipidemia   . Dyshydrosis   . NIDDM (non-insulin dependent diabetes mellitus)     diet controlled   . 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 Menopause   . Hemorrhoids   . Arthritis   . A-fib ([place])   . Anxiety   . Thyroid disease   . Cataract   . Osteopenia 2016  . Allergy     seasonal   Past Surgical History  Procedure Laterality Date  . Tubal ligation    . Bunionectomy bilateral    . Abdominal hysterectomy  partial  . Eye surgery Bilateral     cataracts  . Ablation    . Rotator cuff repair Right   . Lumbar disc surgery      L4 and L5   Family History  Problem Relation Age of Onset  . Stroke Mother   . Diabetes Mother   . Heart disease Father   . Atrial fibrillation Father   . Cancer Sister     ovarian / colon  . Diabetes Sister   . Heart disease Brother   . Hyperlipidemia Brother   . Hypertension Brother   . Diabetes Brother   . Heart attack Brother 37    had to perform emergency surgery  . Hyperlipidemia Sister   . Cancer Sister     liver  . Hyperlipidemia Sister   . Diabetes Sister     [place] DM  .  Cancer Sister   . Diabetes Sister    Social History   Occupational History  . Not on file.   Social History Main Topics  . Smoking status: Never Smoker   . Smokeless tobacco: Never Used  . Alcohol Use: No  . Drug Use: No  . Sexual Activity: Not on file    Dietary issues and exercise activities: Current Exercise Habits:: The patient does not participate in regular exercise at present. Will get outside to walk occasionally, but is not scheduled.   Current Dietary habits: Drinks diet sodas and water. Has stopped drinking orange juice. Likes to eat fruit, especially bananas, counseled that 1 banana is two servings of fruit. Does not limit carbohydrate intake.   Objective:    Today's Vitals   05/20/15 1457  Pulse: 68  Height: 5' 4.25" (1.632 m)  Weight: 174 lb 8 oz (79.153 kg)  PainSc: 4   PainLoc: Neck   Body mass index is 29.72 kg/(m^2).  Activities of Daily Living In your present state of health, do you have any difficulty performing the following activities: 05/20/2015  Hearing? N  Vision? N  Difficulty concentrating or making decisions? N  Walking or climbing stairs? Y  Dressing or bathing? N  Doing errands, shopping? N  Preparing Food and eating ? N  Using the Toilet? N  In the past six months, have you accidently leaked urine? N  Do you have problems with loss of bowel control? N  Managing your Medications? N  Managing your Finances? N  Housekeeping or managing your Housekeeping? N    Are there smokers in your home (other than you)? No   Cardiac Risk Factors include: advanced age (>55men, >15 women);diabetes mellitus;dyslipidemia;hypertension;sedentary lifestyle  Depression Screen PHQ 2/9 Scores 05/20/2015 04/29/2015 01/27/2015 11/24/2014  PHQ - 2 Score 0 0 0 0    Fall Risk Fall Risk  05/20/2015 04/29/2015 01/27/2015 11/24/2014 05/17/2014  Falls in the past year? No No No No Yes  Number falls in past yr: - - - - 1  Injury with Fall? - - - - No  Risk for  fall due to : Medication side effect - - - -    Cognitive Function: MMSE - Mini Mental State Exam 05/20/2015  Orientation to time 5  Orientation to Place 5  Registration 3  Attention/ Calculation 5  Recall 3  Language- name 2 objects 2  Language- repeat 1  Language- follow 3 step command 3  Language- read & follow direction 1  Write a sentence 1  Copy design 1  Total score 30    Immunizations and Health Maintenance  Immunization History  Administered Date(s) Administered  . Influenza Whole 04/01/2012  . Influenza,inj,Quad PF,36+ Mos 04/03/2013  . Influenza-Unspecified 03/15/2014  . Pneumococcal Conjugate-13 05/17/2014  . Pneumococcal Polysaccharide-23 08/02/2009  . Tdap 01/31/2012  . Zoster 01/31/2012   Health Maintenance Due  Topic Date Due  . FOOT EXAM  06/24/1951  . OPHTHALMOLOGY EXAM  06/24/1951  . URINE MICROALBUMIN  05/18/2015    Patient Care Team: Claretta Fraise, MD as PCP - General (Family Medicine)  Assessment:    Annual Wellness Visit 1. Type 2 Diabetes Mellitis, managed w/o insulin. Currently on Actos 15mg  daily and taking glipizide 1 tablet every other day. Will eventually work off glipizide in ~6wks per Dr. Livia Snellen. 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 Performed diabetic foot exam in office, felt all areas of exam on left foot and all but two (heel and ball of foot) on right foot. Needs diabetic eye exam and microalbumin.   Last A1c: 6.9  2. Long term use of ACs w/ chronic A. Fib. Patient reported that she did not miss any doses or take any extra doses. Reported no change in diet and no recent events of bruising or bleeding.  INR today: 1.8  3. Essential hypertension, benign Currently on diltiazem 120mg  daily, lisinopril-HCTZ 20-25mg  every AM, propafenone 225mg  every 8 hours. BP today: 140/74  4. Hyperlipidemia w/ high triglycerides Currently on rosuvastatin 20mg  daily Last LDL: 31 Last TG: 247   Screening Tests Health Maintenance    Topic Date Due  . FOOT EXAM  Done today  . OPHTHALMOLOGY EXAM  [birth year]24/1951 - Remember to make appointment  . URINE MICROALBUMIN  05/18/2015 - Left sample at office  . HEMOGLOBIN A1C  10/28/2015  . INFLUENZA VACCINE  01/31/2016  . DEXA SCAN  08/11/2016  . MAMMOGRAM  10/24/2016  . COLONOSCOPY  11/01/2019  . TETANUS/TDAP  01/30/2022  . ZOSTAVAX  Completed  . PNA vac Low Risk Adult  Completed      Plan:   During the course of the visit Meredith Anderson was educated and counseled about the following appropriate screening and preventive services:   Vaccines to include Pneumoccal, Influenza, Hepatitis B, Td, Zostavax - UTD  Colorectal cancer screening - sent home FOBT to bring back to office  Diabetes screening - performed foot exam  Mammogram - UTD  PAP - needs to schedule appointment with gynocologist  Glaucoma screening / Diabetic Eye Exam - needs to schedule eye exam  Nutrition counseling - counseled on limiting carbohydrates, avoiding fruit juices, drinking plenty of water, and making healthy choices.  Advanced Directives - refused information   Anticoagulation Dose Instructions as of 05/20/2015      Dorene Grebe Tue Wed Thu Fri Sat   New Dose 2 mg 4 mg 2 mg 4 mg 2 mg 4 mg 2 mg    Description        Take 2 tablets today (05/20/2015) and tomorrow (05/21/2015). Then continue warfarin dose of 4mg  or 2 tablets on mondays, wednesdays and fridays and 2mg  or 1 tablet Sundays, tuesdays, thursdays and saturdays.       Orders Placed This Encounter  Procedures  . Microalbumin / creatinine urine ratio   Patient Instructions: Remember to make an appointment for your eye exam Remember to make an appointment with your gynecologist for a PAP smear Continue to check your feet regularly  Try to get more exercise - goal of 21min a day for 5 days a week. Try to make healthy choices when it comes to diet.  Continue to avoid orange juice and limit the amount of bananas you eat. Continue to drink plenty  of water.  Patient Instructions (the written plan) were given to the patient.   Cherre Robins, [facility]   05/20/2015          I have reviewed and agree with the above AWV documentation.  Claretta Fraise, M.D.

## 2015-05-20 NOTE — Addendum Note (Signed)
Addended by: Claretta Fraise on: 05/20/2015 04:57 PM   Modules accepted: Miquel Dunn

## 2015-05-20 NOTE — Patient Instructions (Addendum)
Anticoagulation Dose Instructions as of 05/20/2015      Meredith Anderson Tue Wed Thu Fri Sat   New Dose 2 mg 4 mg 2 mg 4 mg 2 mg 4 mg 2 mg    Description        Take 2 tablets today (05/20/2015) and tomorrow (05/21/2015). Then continue warfarin dose of 4mg  or 2 tablets on mondays, wednesdays and fridays and 2mg  or 1 tablet Sundays, tuesdays, thursdays and saturdays.     INR today was 1.8   Meredith Anderson , Thank you for taking time to come for your Medicare Wellness Visit. I appreciate your ongoing commitment to your health goals. Please review the following plan we discussed and let me know if I can assist you in the future.   Remember to make an appointment for your eye exam Remember to make an appointment with your gynecologist for a PAP smear Continue to check your feet regularly  Try to get more exercise - goal of 87min a day for 5 days a week. Try to make healthy choices when it comes to diet. Continue to avoid orange juice and limit the amount of bananas you eat. Continue to drink plenty of water.  This is a list of the screening recommended for you and due dates:  Health Maintenance  Topic Date Due  . Complete foot exam   Done today  . Eye exam for diabetics  06[birth year]24/1951 - Remember to make appointment  . Urine Protein Check  05/18/2015 - Left sample at office  . Hemoglobin A1C  10/28/2015  . Flu Shot  01/31/2016  . DEXA scan (bone density measurement)  08/11/2016  . Mammogram  10/24/2016  . Colon Cancer Screening  11/01/2019  . Tetanus Vaccine  01/30/2022  . Shingles Vaccine  Completed  . Pneumonia vaccines  Completed

## 2015-05-21 LAB — MICROALBUMIN / CREATININE URINE RATIO
Creatinine, Urine: 54.4 mg/dL
MICROALB/CREAT RATIO: 5.7 mg/g{creat} (ref 0.0–30.0)
MICROALBUM., U, RANDOM: 3.1 ug/mL

## 2015-06-09 ENCOUNTER — Ambulatory Visit (INDEPENDENT_AMBULATORY_CARE_PROVIDER_SITE_OTHER): Payer: Commercial Managed Care - HMO | Admitting: Pharmacist

## 2015-06-09 DIAGNOSIS — I4891 Unspecified atrial fibrillation: Secondary | ICD-10-CM

## 2015-06-09 DIAGNOSIS — Z7901 Long term (current) use of anticoagulants: Secondary | ICD-10-CM

## 2015-06-09 DIAGNOSIS — I482 Chronic atrial fibrillation, unspecified: Secondary | ICD-10-CM

## 2015-06-09 DIAGNOSIS — Z1212 Encounter for screening for malignant neoplasm of rectum: Secondary | ICD-10-CM

## 2015-06-09 LAB — POCT INR: INR: 2.2

## 2015-06-09 NOTE — Addendum Note (Signed)
Addended by: Earlene Plater on: 06/09/2015 02:42 PM   Modules accepted: Orders

## 2015-06-09 NOTE — Patient Instructions (Signed)
Anticoagulation Dose Instructions as of 06/09/2015      Dorene Grebe Tue Wed Thu Fri Sat   New Dose 2 mg 4 mg 2 mg 4 mg 2 mg 4 mg 2 mg    Description        Continue warfarin dose of 4mg  or 2 tablets on mondays, wednesdays and fridays and 2mg  or 1 tablet Sundays, tuesdays, thursdays and saturdays.     INR was 2.2 today

## 2015-06-09 NOTE — Progress Notes (Signed)
Reminded to get diabetic eye exam - patient states that she has not because her sister just died last week.  She is planning to get appt either end of December 2016 or beginning of January 2017

## 2015-06-11 LAB — FECAL OCCULT BLOOD, IMMUNOCHEMICAL: FECAL OCCULT BLD: NEGATIVE

## 2015-06-20 ENCOUNTER — Ambulatory Visit (INDEPENDENT_AMBULATORY_CARE_PROVIDER_SITE_OTHER): Payer: Commercial Managed Care - HMO | Admitting: Nurse Practitioner

## 2015-06-20 VITALS — BP 105/66 | HR 63 | Temp 96.8°F | Ht 64.0 in | Wt 174.0 lb

## 2015-06-20 DIAGNOSIS — J209 Acute bronchitis, unspecified: Secondary | ICD-10-CM

## 2015-06-20 MED ORDER — HYDROCODONE-HOMATROPINE 5-1.5 MG/5ML PO SYRP: 5.0000 mL | ORAL_SOLUTION | Freq: Four times a day (QID) | ORAL | Status: DC | PRN

## 2015-06-20 MED ORDER — AMOXICILLIN 500 MG PO CAPS: 500.0000 mg | ORAL_CAPSULE | Freq: Three times a day (TID) | ORAL | Status: DC

## 2015-06-20 NOTE — Patient Instructions (Signed)

## 2015-06-20 NOTE — Progress Notes (Signed)
  Subjective:     Meredith Anderson is a 74 y.o. female who presents for evaluation of sore throat. Associated symptoms include headache, hoarseness, nasal blockage, post nasal drip, sinus and nasal congestion and sore throat. Onset of symptoms was 4 days ago, and have been gradually worsening since that time. She is drinking plenty of fluids. She has not had a recent close exposure to someone with proven streptococcal pharyngitis. * SHe had some amoxicillin at home and she started taking it Friday only had 4 tablets so only took one tablet a day.- also had some hycodan left over and also started that.The following portions of the patient's history were reviewed and updated as appropriate: allergies, current medications, past family history, past medical history, past social history, past surgical history and problem list.  Review of Systems Pertinent items are noted in HPI.    Objective:    BP 105/66 mmHg  Pulse 63  Temp(Src) 96.8 F (36 C) (Oral)  Ht 5\' 4"  (1.626 m)  Wt 174 lb (78.926 kg)  BMI 29.85 kg/m2 General appearance: alert and cooperative Eyes: conjunctivae/corneas clear. PERRL, EOM's intact. Fundi benign. Ears: normal TM's and external ear canals both ears Nose: brown discharge, mild congestion, turbinates red, no sinus tenderness Throat: lips, mucosa, and tongue normal; teeth and gums normal Neck: no adenopathy, no carotid bruit, no JVD, supple, symmetrical, trachea midline and thyroid not enlarged, symmetric, no tenderness/mass/nodules Lungs: clear to auscultation bilaterally and deep dry cough Heart: regular rate and rhythm, S1, S2 normal, no murmur, click, rub or gallop  Laboratory Strep test not done.   Assessment:    Acute bronchitis  Plan:   1. Take meds as prescribed 2. Use a cool mist humidifier especially during the winter months and when heat has been humid. 3. Use saline nose sprays frequently 4. Saline irrigations of the nose can be very helpful if done  frequently.  * 4X daily for 1 week*  * Use of a nettie pot can be helpful with this. Follow directions with this* 5. Drink plenty of fluids 6. Keep thermostat turn down low 7.For any cough or congestion  Use plain Mucinex- regular strength or max strength is fine   * Children- consult with Pharmacist for dosing 8. For fever or aces or pains- take tylenol or ibuprofen appropriate for age and weight.  * for fevers greater than 101 orally you may alternate ibuprofen and tylenol every  3 hours.     Meds ordered this encounter  Medications  . amoxicillin (AMOXIL) 500 MG capsule    Sig: Take 1 capsule (500 mg total) by mouth 3 (three) times daily.    Dispense:  30 capsule    Refill:  0    Order Specific Question:  Supervising Provider    Answer:  Chipper Herb [[id number]]  . HYDROcodone-homatropine (HYCODAN) 5-1.5 MG/5ML syrup    Sig: Take 5 mLs by mouth every 6 (six) hours as needed for cough.    Dispense:  120 mL    Refill:  0    Order Specific Question:  Supervising Provider    Answer:  Chipper Herb [id number]   Mary-Margaret Hassell Done, FNP

## 2015-06-24 ENCOUNTER — Other Ambulatory Visit: Payer: Self-pay

## 2015-06-24 MED ORDER — GLUCOSE BLOOD VI STRP: ORAL_STRIP | Status: DC

## 2015-07-13 ENCOUNTER — Ambulatory Visit (INDEPENDENT_AMBULATORY_CARE_PROVIDER_SITE_OTHER): Payer: Medicare Other | Admitting: Pharmacist

## 2015-07-13 DIAGNOSIS — Z7901 Long term (current) use of anticoagulants: Secondary | ICD-10-CM | POA: Diagnosis not present

## 2015-07-13 DIAGNOSIS — I482 Chronic atrial fibrillation, unspecified: Secondary | ICD-10-CM

## 2015-07-13 DIAGNOSIS — H9190 Unspecified hearing loss, unspecified ear: Secondary | ICD-10-CM

## 2015-07-13 DIAGNOSIS — I4891 Unspecified atrial fibrillation: Secondary | ICD-10-CM

## 2015-07-13 LAB — POCT INR: INR: 1.8

## 2015-07-13 MED ORDER — RIVAROXABAN 15 MG PO TABS
15.0000 mg | ORAL_TABLET | Freq: Every day | ORAL | Status: DC
Start: 2015-07-13 — End: 2015-08-23

## 2015-07-13 MED ORDER — RIVAROXABAN 15 MG PO TABS: 15.0000 mg | ORAL_TABLET | Freq: Two times a day (BID) | ORAL | Status: DC

## 2015-07-13 NOTE — Progress Notes (Signed)
Subjective:     Indication: atrial fibrillation Bleeding signs/symptoms: None Thromboembolic signs/symptoms: None  Missed Coumadin doses: None Medication changes: yes - took amoxicillin at beginning of December 2016 Dietary changes: no Bacterial/viral infection: no Other concerns: yes - patient asks about changing to a NOAC so she does not have to have blood checked so oftne  The following portions of the patient's history were reviewed and updated as appropriate: allergies, current medications and problem list.    Objective:    INR Today: 1.8 Current dose: warfarin 2mg  - take 2mg  on tu/th/sat and 4mg  all other days.    Estimated Creatinine Clearance  = 32mL/min Assessment:    Subtherapeutic INR for goal of 2-3   Plan:    1. D/C warfarin.  Start Xarrelto 15mg  take 1 tablet each evening with supper (chose Xarelto because was tier 2 and Eliquis was tier 3).   2. No repeat INR required but I would recommend check CBC and BMET at least every 6 months    Cherre Robins, PharmD, CPP, CDE

## 2015-07-13 NOTE — Patient Instructions (Signed)
Stop warfarin. Start Xarelto 15mg  - take 1 tablet daily with supper.

## 2015-08-09 ENCOUNTER — Telehealth: Payer: Self-pay

## 2015-08-09 NOTE — Telephone Encounter (Signed)
Insurance prior authorized Xarelto through 07/01/16 

## 2015-08-12 ENCOUNTER — Telehealth: Payer: Self-pay | Admitting: Family Medicine

## 2015-08-23 ENCOUNTER — Encounter: Payer: Self-pay | Admitting: Family Medicine

## 2015-08-23 ENCOUNTER — Ambulatory Visit (INDEPENDENT_AMBULATORY_CARE_PROVIDER_SITE_OTHER): Payer: Medicare Other | Admitting: Family Medicine

## 2015-08-23 VITALS — BP 96/63 | HR 62 | Temp 97.1°F | Ht 63.0 in | Wt 169.8 lb

## 2015-08-23 DIAGNOSIS — R05 Cough: Secondary | ICD-10-CM | POA: Diagnosis not present

## 2015-08-23 DIAGNOSIS — Z7901 Long term (current) use of anticoagulants: Secondary | ICD-10-CM

## 2015-08-23 DIAGNOSIS — E1169 Type 2 diabetes mellitus with other specified complication: Secondary | ICD-10-CM | POA: Diagnosis not present

## 2015-08-23 DIAGNOSIS — I10 Essential (primary) hypertension: Secondary | ICD-10-CM | POA: Diagnosis not present

## 2015-08-23 DIAGNOSIS — R059 Cough, unspecified: Secondary | ICD-10-CM

## 2015-08-23 DIAGNOSIS — E8881 Metabolic syndrome: Secondary | ICD-10-CM

## 2015-08-23 DIAGNOSIS — E782 Mixed hyperlipidemia: Secondary | ICD-10-CM

## 2015-08-23 DIAGNOSIS — E039 Hypothyroidism, unspecified: Secondary | ICD-10-CM | POA: Diagnosis not present

## 2015-08-23 LAB — POCT INFLUENZA A/B
INFLUENZA A, POC: POSITIVE — AB
INFLUENZA B, POC: NEGATIVE

## 2015-08-23 LAB — POCT GLYCOSYLATED HEMOGLOBIN (HGB A1C): HEMOGLOBIN A1C: 6.9

## 2015-08-23 MED ORDER — PIOGLITAZONE HCL 30 MG PO TABS: 30.0000 mg | ORAL_TABLET | Freq: Every day | ORAL | Status: DC

## 2015-08-23 MED ORDER — LISINOPRIL-HYDROCHLOROTHIAZIDE 20-25 MG PO TABS: 0.5000 | ORAL_TABLET | Freq: Every day | ORAL | Status: DC

## 2015-08-23 MED ORDER — LISINOPRIL-HYDROCHLOROTHIAZIDE 10-12.5 MG PO TABS: 1.0000 | ORAL_TABLET | Freq: Every day | ORAL | Status: DC

## 2015-08-23 MED ORDER — RIVAROXABAN 15 MG PO TABS: 15.0000 mg | ORAL_TABLET | Freq: Every day | ORAL | Status: DC

## 2015-08-23 NOTE — Addendum Note (Signed)
Addended by: Marin Olp on: 08/23/2015 11:52 AM   Modules accepted: Orders, Medications

## 2015-08-23 NOTE — Addendum Note (Signed)
Addended by: Marin Olp on: 08/23/2015 06:26 PM   Modules accepted: Orders

## 2015-08-23 NOTE — Progress Notes (Signed)
Subjective:  Patient ID: Meredith Anderson, female    DOB: 08/13/1940  Age: 75 y.o. MRN: [id number]  CC: Diabetes; Hypertension; Hyperlipidemia; and Hypothyroidism   HPI Meredith Anderson presents for  follow-up of hypertension. Patient has no history of headache chest pain or shortness of breath or recent cough. Patient also denies symptoms of TIA such as numbness weakness lateralizing. Patient checks  blood pressure at home and has not had any elevated readings recently. Patient denies side effects from his medication. States taking it regularly.  Patient also  in for follow-up of elevated cholesterol. Doing well without complaints on current medication. Denies side effects of statin including myalgia and arthralgia and nausea. Also in today for liver function testing. Currently no chest pain, shortness of breath or other cardiovascular related symptoms noted.  Follow-up of diabetes. Patient does check blood sugar at home. Readings run between 125 and 150 Patient denies symptoms such as polyuria, polydipsia, excessive hunger, nausea Admits eating too much sugary food. No significant hypoglycemic spells noted. Medications as noted below. Taking them regularly without complication/adverse reaction being reported today.    History Meredith Anderson has a past medical history of Hypertension; Hyperlipidemia; Dyshydrosis; NIDDM (non-insulin dependent diabetes mellitus); CA - cancer of parotid gland; Menopause; Hemorrhoids; Arthritis; A-fib ([place]); Anxiety; Thyroid disease; Cataract; Osteopenia (2016); and Allergy.   She has past surgical history that includes Tubal ligation; bunionectomy bilateral; Abdominal hysterectomy; Eye surgery (Bilateral); Ablation; Rotator cuff repair (Right); and Lumbar disc surgery.   Her family history includes Atrial fibrillation in her father; Cancer in her sister, sister, and sister; Diabetes in her brother, mother, sister, sister, and sister; Heart attack (age of onset: 44) in her  brother; Heart disease in her brother and father; Hyperlipidemia in her brother, sister, and sister; Hypertension in her brother; Stroke in her mother.She reports that she has never smoked. She has never used smokeless tobacco. She reports that she does not drink alcohol or use illicit drugs.  Current Outpatient Prescriptions on File Prior to Visit  Medication Sig Dispense Refill  . Acetaminophen 500 MG coapsule     . bismuth subsalicylate (PEPTO BISMOL) 262 MG chewable tablet as needed. Reported on 07/13/2015    . cholecalciferol (VITAMIN D) 1000 UNITS tablet Take 1,000 Units by mouth daily.    Marland Kitchen conjugated estrogens (PREMARIN) vaginal cream Place 1 g vaginally 2 (two) times a week. Place vaginally twice a week.    . diclofenac sodium (VOLTAREN) 1 % GEL Apply 1 to 2 grams to area of pain up to qid as needed 100 g 2  . diltiazem (CARDIZEM CD) 120 MG 24 hr capsule TAKE 1 CAPSULE BY MOUTH ONCE   DAILY FOR HEART RATE 90 capsule 1  . diphenhydrAMINE (BENADRYL) 25 mg capsule 25 mg as needed.     Marland Kitchen escitalopram (LEXAPRO) 10 MG tablet TAKE 1 TABLET BY MOUTH ONCE A DAY (TO REPLACE CELEXA, GENERIC CITALOPRAM) (Patient taking differently: TAKE 1 TABLET BY MOUTH ONCE A DAY (TO REPLACE CELEXA, GENERIC CITALOPRAM)  Takes 1/2 tablet at night) 90 tablet 3  . glucose blood (ACCU-CHEK AVIVA PLUS) test strip USE TO CHECK BLOOD SUGAR ONCE A DAY OR AS INSTRUCTED 50 each 1  . hydrocortisone (PROCTOZONE-HC) 2.5 % rectal cream APPLY TO RECTAL AREA EVERY 6 HOURS AS NEEDED FOR HEMORRHOIDS 30 g 2  . levothyroxine (SYNTHROID, LEVOTHROID) 137 MCG tablet Take 1 tablet (137 mcg total) by mouth daily before breakfast. 90 tablet 3  . magnesium oxide (MAG-OX) 400 MG  tablet 400 mg.    . propafenone (RYTHMOL) 225 MG tablet TAKE ONE TABLET BY MOUTH EVERY EIGHT HOURS 270 tablet 1  . Rivaroxaban (XARELTO) 15 MG TABS tablet Take 1 tablet (15 mg total) by mouth daily with supper. 90 tablet 0  . TETRAHYDROZOLINE HCL OP Apply to eye. Use  as needed for redness     . HYDROcodone-homatropine (HYCODAN) 5-1.5 MG/5ML syrup Take 5 mLs by mouth every 6 (six) hours as needed for cough. (Patient not taking: Reported on 08/23/2015) 120 mL 0  . rosuvastatin (CRESTOR) 20 MG tablet Take 1 tablet (20 mg total) by mouth daily. 90 tablet 3   No current facility-administered medications on file prior to visit.    ROS Review of Systems  Constitutional: Negative for fever, activity change and appetite change.  HENT: Negative for congestion, rhinorrhea and sore throat.   Eyes: Negative for visual disturbance.  Respiratory: Negative for cough and shortness of breath.   Cardiovascular: Negative for chest pain and palpitations.  Gastrointestinal: Negative for nausea, abdominal pain and diarrhea.  Genitourinary: Negative for dysuria.  Musculoskeletal: Negative for myalgias and arthralgias.    Objective:  BP 96/63 mmHg  Pulse 62  Temp(Src) 97.1 F (36.2 C) (Oral)  Ht 5' 3"  (1.6 m)  Wt 169 lb 12.8 oz (77.021 kg)  BMI 30.09 kg/m2  SpO2 100%  BP Readings from Last 3 Encounters:  08/23/15 96/63  06/20/15 105/66  04/29/15 111/66    Wt Readings from Last 3 Encounters:  08/23/15 169 lb 12.8 oz (77.021 kg)  06/20/15 174 lb (78.926 kg)  05/20/15 174 lb 8 oz (79.153 kg)     Physical Exam  Constitutional: She is oriented to person, place, and time. She appears well-developed and well-nourished. No distress.  HENT:  Head: Normocephalic and atraumatic.  Right Ear: External ear normal.  Left Ear: External ear normal.  Nose: Nose normal.  Mouth/Throat: Oropharynx is clear and moist.  Eyes: Conjunctivae and EOM are normal. Pupils are equal, round, and reactive to light.  Neck: Normal range of motion. Neck supple. No thyromegaly present.  Cardiovascular: Normal rate, regular rhythm and normal heart sounds.   No murmur heard. Pulmonary/Chest: Effort normal and breath sounds normal. No respiratory distress. She has no wheezes. She has no  rales.  Abdominal: Soft. Bowel sounds are normal. She exhibits no distension. There is no tenderness.  Lymphadenopathy:    She has no cervical adenopathy.  Neurological: She is alert and oriented to person, place, and time. She has normal reflexes.  Skin: Skin is warm and dry.  Psychiatric: She has a normal mood and affect. Her behavior is normal. Judgment and thought content normal.   Diabetic Foot Form - Detailed   Diabetic Foot Exam - detailed  Visual Foot Exam completed.:  Yes  Is there a history of foot ulcer?:  No  Can the patient see the bottom of their feet?:  Yes  Are the shoes appropriate in style and fit?:  Yes  Is there swelling or and abnormal foot shape?:  No  Are the toenails long?:  No  Are the toenails thick?:  No  Do you have pain in calf while walking?:  No  Is there a claw toe deformity?:  No  Is there elevated skin temparature?:  No  Is there limited skin dorsiflexion?:  No  Is there foot or ankle muscle weakness?:  Yes  Are the toenails ingrown?:  No  Normal Range of Motion:  Yes  Pulse Foot Exam completed.:  Yes  Right posterior Tibialias:  Present Left posterior Tibialias:  Present  Right Dorsalis Pedis:  Present Left Dorsalis Pedis:  Present  Semmes-Weinstein Monofilament Test  R Foot Test Control:  Pos L Foot Test Control:  Pos  R Site 1-Great Toe:  Pos L Site 1-Great Toe:  Pos  R Site 4:  Pos L Site 4:  Pos  R Site 5:  Pos L Site 5:  Pos    Comments:  Right great toe sensation negative on initial test, positive on repeat after a delay       Lab Results  Component Value Date   HGBA1C 6.9 08/23/2015   HGBA1C 6.9 04/29/2015   HGBA1C 6.6 11/24/2014    Lab Results  Component Value Date   WBC 5.6 04/29/2015   HGB 12.0* 11/24/2014   HCT 36.1 04/29/2015   PLT 281 04/29/2015   GLUCOSE 164* 04/29/2015   CHOL 115 04/29/2015   TRIG 247* 04/29/2015   HDL 35* 04/29/2015   LDLCALC 31 04/29/2015   ALT 27 04/29/2015   AST 26 04/29/2015   NA 133*  04/29/2015   K 4.1 04/29/2015   CL 93* 04/29/2015   CREATININE 1.13* 04/29/2015   BUN 24 04/29/2015   CO2 24 04/29/2015   TSH 3.230 01/27/2015   INR 1.8 07/13/2015   HGBA1C 6.9 08/23/2015    No results found.  Assessment & Plan:   Meredith Anderson was seen today for diabetes, hypertension, hyperlipidemia and hypothyroidism.  Diagnoses and all orders for this visit:  Type 2 diabetes mellitus with other specified complication, without long-term current use of insulin (HCC)  Essential hypertension, benign -     Cancel: POCT CBC -     POCT glycosylated hemoglobin (Hb A1C) -     CMP14+EGFR -     CBC with Differential/Platelet  Elevated cholesterol with high triglycerides -     POCT glycosylated hemoglobin (Hb A1C) -     CMP14+EGFR -     Lipid panel -     CBC with Differential/Platelet  Metabolic syndrome -     POCT glycosylated hemoglobin (Hb A1C) -     CMP14+EGFR -     CBC with Differential/Platelet  Long term (current) use of anticoagulants  Hypothyroidism, unspecified hypothyroidism type -     TSH + free T4  Other orders -     Discontinue: lisinopril-hydrochlorothiazide (PRINZIDE,ZESTORETIC) 20-25 MG tablet; Take 0.5 tablets by mouth daily. TAKE  ONE TABLET BY MOUTH EVERY MORNING FOR BLOOD PRESSURE -     pioglitazone (ACTOS) 30 MG tablet; Take 1 tablet (30 mg total) by mouth daily. -     lisinopril-hydrochlorothiazide (ZESTORETIC) 10-12.5 MG tablet; Take 1 tablet by mouth daily.   I have discontinued Ms. Cavell's glipiZIDE, lisinopril-hydrochlorothiazide, and lisinopril-hydrochlorothiazide. I have also changed her pioglitazone. Additionally, I am having her start on lisinopril-hydrochlorothiazide. Lastly, I am having her maintain her TETRAHYDROZOLINE HCL OP, conjugated estrogens, magnesium oxide, diphenhydrAMINE, bismuth subsalicylate, hydrocortisone, cholecalciferol, rosuvastatin, diclofenac sodium, diltiazem, escitalopram, levothyroxine, propafenone, Acetaminophen,  HYDROcodone-homatropine, glucose blood, and Rivaroxaban.  Meds ordered this encounter  Medications  . DISCONTD: lisinopril-hydrochlorothiazide (PRINZIDE,ZESTORETIC) 20-25 MG tablet    Sig: Take 0.5 tablets by mouth daily. TAKE  ONE TABLET BY MOUTH EVERY MORNING FOR BLOOD PRESSURE    Dispense:  45 tablet    Refill:  3  . pioglitazone (ACTOS) 30 MG tablet    Sig: Take 1 tablet (30 mg total) by mouth daily.    Dispense:  90 tablet    Refill:  3  . lisinopril-hydrochlorothiazide (ZESTORETIC) 10-12.5 MG tablet    Sig: Take 1 tablet by mouth daily.    Dispense:  90 tablet    Refill:  3     Follow-up: Return in about 3 months (around 11/20/2015) for diabetes, hypertension, Hypothyroidism.  Claretta Fraise, M.D.

## 2015-08-24 LAB — CMP14+EGFR
A/G RATIO: 1.5 (ref 1.1–2.5)
ALT: 18 IU/L (ref 0–32)
AST: 18 IU/L (ref 0–40)
Albumin: 4.4 g/dL (ref 3.5–4.8)
Alkaline Phosphatase: 71 IU/L (ref 39–117)
BUN/Creatinine Ratio: 16 (ref 11–26)
BUN: 20 mg/dL (ref 8–27)
Bilirubin Total: 0.5 mg/dL (ref 0.0–1.2)
CALCIUM: 9.7 mg/dL (ref 8.7–10.3)
CHLORIDE: 90 mmol/L — AB (ref 96–106)
CO2: 22 mmol/L (ref 18–29)
Creatinine, Ser: 1.22 mg/dL — ABNORMAL HIGH (ref 0.57–1.00)
GFR, EST AFRICAN AMERICAN: 50 mL/min/{1.73_m2} — AB (ref >59)
GFR, EST NON AFRICAN AMERICAN: 44 mL/min/{1.73_m2} — AB (ref >59)
GLUCOSE: 137 mg/dL — AB (ref 65–99)
Globulin, Total: 2.9 g/dL (ref 1.5–4.5)
POTASSIUM: 3.4 mmol/L — AB (ref 3.5–5.2)
Sodium: 132 mmol/L — ABNORMAL LOW (ref 134–144)
TOTAL PROTEIN: 7.3 g/dL (ref 6.0–8.5)

## 2015-08-24 LAB — CBC WITH DIFFERENTIAL/PLATELET
Basophils Absolute: 0 10*3/uL (ref 0.0–0.2)
Basos: 0 %
EOS (ABSOLUTE): 0.1 10*3/uL (ref 0.0–0.4)
EOS: 2 %
HEMATOCRIT: 37 % (ref 34.0–46.6)
HEMOGLOBIN: 12.2 g/dL (ref 11.1–15.9)
IMMATURE GRANULOCYTES: 0 %
Immature Grans (Abs): 0 10*3/uL (ref 0.0–0.1)
Lymphocytes Absolute: 0.8 10*3/uL (ref 0.7–3.1)
Lymphs: 21 %
MCH: 30.1 pg (ref 26.6–33.0)
MCHC: 33 g/dL (ref 31.5–35.7)
MCV: 91 fL (ref 79–97)
MONOCYTES: 16 %
MONOS ABS: 0.6 10*3/uL (ref 0.1–0.9)
NEUTROS PCT: 61 %
Neutrophils Absolute: 2.1 10*3/uL (ref 1.4–7.0)
Platelets: 228 10*3/uL (ref 150–379)
RBC: 4.05 x10E6/uL (ref 3.77–5.28)
RDW: 14.2 % (ref 12.3–15.4)
WBC: 3.5 10*3/uL (ref 3.4–10.8)

## 2015-08-24 LAB — LIPID PANEL
Chol/HDL Ratio: 2.9 ratio units (ref 0.0–4.4)
Cholesterol, Total: 117 mg/dL (ref 100–199)
HDL: 40 mg/dL (ref >39)
LDL Calculated: 44 mg/dL (ref 0–99)
Triglycerides: 164 mg/dL — ABNORMAL HIGH (ref 0–149)
VLDL Cholesterol Cal: 33 mg/dL (ref 5–40)

## 2015-08-25 DIAGNOSIS — H9313 Tinnitus, bilateral: Secondary | ICD-10-CM | POA: Diagnosis not present

## 2015-08-25 DIAGNOSIS — H903 Sensorineural hearing loss, bilateral: Secondary | ICD-10-CM | POA: Diagnosis not present

## 2015-08-25 LAB — TSH+FREE T4
FREE T4: 1.36 ng/dL (ref 0.82–1.77)
TSH: 2.75 u[IU]/mL (ref 0.450–4.500)

## 2015-08-25 LAB — SPECIMEN STATUS REPORT

## 2015-08-25 NOTE — Progress Notes (Signed)
Patient aware.

## 2015-08-29 ENCOUNTER — Other Ambulatory Visit: Payer: Medicare Other

## 2015-08-29 ENCOUNTER — Encounter: Payer: Self-pay | Admitting: Family Medicine

## 2015-08-29 ENCOUNTER — Ambulatory Visit (INDEPENDENT_AMBULATORY_CARE_PROVIDER_SITE_OTHER): Payer: Medicare Other | Admitting: Family Medicine

## 2015-08-29 VITALS — BP 130/73 | HR 80 | Temp 97.5°F | Ht 63.0 in | Wt 171.4 lb

## 2015-08-29 DIAGNOSIS — R197 Diarrhea, unspecified: Secondary | ICD-10-CM | POA: Diagnosis not present

## 2015-08-29 DIAGNOSIS — J111 Influenza due to unidentified influenza virus with other respiratory manifestations: Secondary | ICD-10-CM | POA: Diagnosis not present

## 2015-08-29 MED ORDER — HYDROCODONE-HOMATROPINE 5-1.5 MG/5ML PO SYRP: 5.0000 mL | ORAL_SOLUTION | Freq: Four times a day (QID) | ORAL | Status: DC | PRN

## 2015-08-29 MED ORDER — OSELTAMIVIR PHOSPHATE 75 MG PO CAPS: 75.0000 mg | ORAL_CAPSULE | Freq: Two times a day (BID) | ORAL | Status: DC

## 2015-08-29 MED ORDER — DIPHENOXYLATE-ATROPINE 2.5-0.025 MG PO TABS: 1.0000 | ORAL_TABLET | Freq: Four times a day (QID) | ORAL | Status: DC | PRN

## 2015-08-29 NOTE — Progress Notes (Addendum)
Subjective:  Patient ID: Meredith Anderson, female    DOB: 1940/09/18  Age: 75 y.o. MRN: [id number]  CC: Cough; Fever; and Diarrhea   HPI Meredith Anderson presents for  Patient presents with dry cough runny stuffy nose. Diffuse headache of moderate intensity. Patient also has chills and subjective fever. Body aches worst in the back but present in the legs, shoulders, and torso as well. Has sapped the energy to the point that of being unable to perform usual activities other than ADLs.Started taking amoxicillin from a left over scrip three days ago. Also now having 2-3 loose BM/day.    History Meredith Anderson has a past medical history of Hypertension; Hyperlipidemia; Dyshydrosis; NIDDM (non-insulin dependent diabetes mellitus); CA - cancer of parotid gland; Menopause; Hemorrhoids; Arthritis; A-fib ([place]); Anxiety; Thyroid disease; Cataract; Osteopenia (2016); and Allergy.   She has past surgical history that includes Tubal ligation; bunionectomy bilateral; Abdominal hysterectomy; Eye surgery (Bilateral); Ablation; Rotator cuff repair (Right); and Lumbar disc surgery.   Her family history includes Atrial fibrillation in her father; Cancer in her sister, sister, and sister; Diabetes in her brother, mother, sister, sister, and sister; Heart attack (age of onset: 73) in her brother; Heart disease in her brother and father; Hyperlipidemia in her brother, sister, and sister; Hypertension in her brother; Stroke in her mother.She reports that she has never smoked. She has never used smokeless tobacco. She reports that she does not drink alcohol or use illicit drugs.    ROS Review of Systems  Constitutional: Negative for fever, chills, diaphoresis, appetite change and fatigue.  HENT: Negative for congestion, ear pain, hearing loss, postnasal drip, rhinorrhea, sore throat and trouble swallowing.   Respiratory: Positive for cough. Negative for chest tightness and shortness of breath.   Cardiovascular: Negative for  chest pain and palpitations.  Gastrointestinal: Positive for diarrhea. Negative for abdominal pain.  Musculoskeletal: Negative for arthralgias.  Skin: Negative for rash.    Objective:  BP 130/73 mmHg  Pulse 80  Temp(Src) 97.5 F (36.4 C) (Oral)  Ht 5\' 3"  (1.6 m)  Wt 171 lb 6.4 oz (77.747 kg)  BMI 30.37 kg/m2  SpO2 100%  BP Readings from Last 3 Encounters:  08/29/15 130/73  08/23/15 96/63  06/20/15 105/66    Wt Readings from Last 3 Encounters:  08/29/15 171 lb 6.4 oz (77.747 kg)  08/23/15 169 lb 12.8 oz (77.021 kg)  06/20/15 174 lb (78.926 kg)     Physical Exam  Constitutional: She is oriented to person, place, and time. She appears well-developed and well-nourished. No distress.  HENT:  Head: Normocephalic and atraumatic.  Eyes: Conjunctivae are normal. Pupils are equal, round, and reactive to light.  Neck: Normal range of motion. Neck supple. No thyromegaly present.  Cardiovascular: Normal rate, regular rhythm and normal heart sounds.   No murmur heard. Pulmonary/Chest: Effort normal and breath sounds normal. No respiratory distress. She has no wheezes. She has no rales.  Abdominal: Soft. Bowel sounds are normal. She exhibits no distension. There is no tenderness.  Musculoskeletal: Normal range of motion.  Lymphadenopathy:    She has no cervical adenopathy.  Neurological: She is alert and oriented to person, place, and time.  Skin: Skin is warm and dry.  Psychiatric: She has a normal mood and affect. Her behavior is normal. Judgment and thought content normal.     Lab Results  Component Value Date   WBC 3.5 08/23/2015   HGB 12.0* 11/24/2014   HCT 37.0 08/23/2015   PLT 228  08/23/2015   GLUCOSE 137* 08/23/2015   CHOL 117 08/23/2015   TRIG 164* 08/23/2015   HDL 40 08/23/2015   LDLCALC 44 08/23/2015   ALT 18 08/23/2015   AST 18 08/23/2015   NA 132* 08/23/2015   K 3.4* 08/23/2015   CL 90* 08/23/2015   CREATININE 1.22* 08/23/2015   BUN 20 08/23/2015   CO2  22 08/23/2015   TSH 2.750 08/23/2015   INR 1.8 07/13/2015   HGBA1C 6.9 08/23/2015    No results found.  Assessment & Plan:   Meredith Anderson was seen today for cough, fever and diarrhea.  Diagnoses and all orders for this visit:  Influenza with respiratory manifestation -     DG Chest 2 View; Future  Diarrhea, unspecified type  Other orders -     HYDROcodone-homatropine (HYCODAN) 5-1.5 MG/5ML syrup; Take 5 mLs by mouth every 6 (six) hours as needed for cough. -     diphenoxylate-atropine (LOMOTIL) 2.5-0.025 MG tablet; Take 1 tablet by mouth 4 (four) times daily as needed for diarrhea or loose stools. -     oseltamivir (TAMIFLU) 75 MG capsule; Take 1 capsule (75 mg total) by mouth 2 (two) times daily.    Diarrhea related to flu tx.  I am having Ms. Bertini start on diphenoxylate-atropine and oseltamivir. I am also having her maintain her TETRAHYDROZOLINE HCL OP, conjugated estrogens, magnesium oxide, diphenhydrAMINE, bismuth subsalicylate, hydrocortisone, cholecalciferol, rosuvastatin, diclofenac sodium, diltiazem, escitalopram, levothyroxine, propafenone, Acetaminophen, glucose blood, pioglitazone, lisinopril-hydrochlorothiazide, Rivaroxaban, and HYDROcodone-homatropine.  Meds ordered this encounter  Medications  . HYDROcodone-homatropine (HYCODAN) 5-1.5 MG/5ML syrup    Sig: Take 5 mLs by mouth every 6 (six) hours as needed for cough.    Dispense:  120 mL    Refill:  0  . diphenoxylate-atropine (LOMOTIL) 2.5-0.025 MG tablet    Sig: Take 1 tablet by mouth 4 (four) times daily as needed for diarrhea or loose stools.    Dispense:  30 tablet    Refill:  0  . oseltamivir (TAMIFLU) 75 MG capsule    Sig: Take 1 capsule (75 mg total) by mouth 2 (two) times daily.    Dispense:  10 capsule    Refill:  0     Follow-up: Return if symptoms worsen or fail to improve.  Claretta Fraise, M.D.

## 2015-08-29 NOTE — Addendum Note (Signed)
Addended by: Claretta Fraise on: 08/29/2015 11:17 AM   Modules accepted: Orders

## 2015-09-19 ENCOUNTER — Ambulatory Visit (INDEPENDENT_AMBULATORY_CARE_PROVIDER_SITE_OTHER): Payer: Medicare Other | Admitting: Family Medicine

## 2015-09-19 ENCOUNTER — Encounter: Payer: Self-pay | Admitting: Family Medicine

## 2015-09-19 VITALS — BP 134/73 | HR 62 | Temp 98.0°F | Ht 63.0 in | Wt 172.6 lb

## 2015-09-19 DIAGNOSIS — J019 Acute sinusitis, unspecified: Secondary | ICD-10-CM | POA: Diagnosis not present

## 2015-09-19 MED ORDER — CEFDINIR 300 MG PO CAPS: 300.0000 mg | ORAL_CAPSULE | Freq: Two times a day (BID) | ORAL | Status: DC

## 2015-09-19 NOTE — Progress Notes (Signed)
BP 134/73 mmHg  Pulse 62  Temp(Src) 98 F (36.7 C) (Oral)  Ht 5\' 3"  (1.6 m)  Wt 172 lb 9.6 oz (78.291 kg)  BMI 30.58 kg/m2  SpO2 99%   Subjective:    Patient ID: Meredith Anderson, female    DOB: 09-30-1940, 75 y.o.   MRN: [id number]  HPI: [name] is a 75 y.o. female presenting on 09/19/2015 for Cough and Sinusitis   HPI Cough and chest congestion and sinus congestion Patient was diagnosed with the flu one month ago and since that time her chills and fevers and aches have all gone but she is continuing to have sinus congestion and chest congestion and a cough. Her cough is mostly nonproductive at this point. She's been having sinus pressure above her eyes and below her eyes. Is losing having a headache associated with it. She says her cough is worse at night and she does admit that she is having some postnasal drainage. She has tried some nasal saline does not really use anything else consistently.  Relevant past medical, surgical, family and social history reviewed and updated as indicated. Interim medical history since our last visit reviewed. Allergies and medications reviewed and updated.  Review of Systems  Constitutional: Negative for fever and chills.  HENT: Positive for congestion, postnasal drip, rhinorrhea, sinus pressure, sneezing and sore throat. Negative for ear discharge and ear pain.   Eyes: Negative for pain, redness and visual disturbance.  Respiratory: Positive for cough. Negative for chest tightness and shortness of breath.   Cardiovascular: Negative for chest pain and leg swelling.  Genitourinary: Negative for dysuria and difficulty urinating.  Musculoskeletal: Negative for back pain and gait problem.  Skin: Negative for rash.  Neurological: Negative for light-headedness and headaches.  Psychiatric/Behavioral: Negative for behavioral problems and agitation.  All other systems reviewed and are negative.   Per HPI unless specifically indicated above       Medication List       This list is accurate as of: 09/19/15 11:39 AM.  Always use your most recent med list.               Acetaminophen 500 MG coapsule     bismuth subsalicylate 99991111 MG chewable tablet  Commonly known as:  PEPTO BISMOL  as needed. Reported on 07/13/2015     cefdinir 300 MG capsule  Commonly known as:  OMNICEF  Take 1 capsule (300 mg total) by mouth 2 (two) times daily. 1 po BID     cholecalciferol 1000 units tablet  Commonly known as:  VITAMIN D  Take 1,000 Units by mouth daily.     conjugated estrogens vaginal cream  Commonly known as:  PREMARIN  Place 1 g vaginally 2 (two) times a week. Place vaginally twice a week.     diclofenac sodium 1 % Gel  Commonly known as:  VOLTAREN  Apply 1 to 2 grams to area of pain up to qid as needed     diltiazem 120 MG 24 hr capsule  Commonly known as:  CARDIZEM CD  TAKE 1 CAPSULE BY MOUTH ONCE   DAILY FOR HEART RATE     diphenhydrAMINE 25 mg capsule  Commonly known as:  BENADRYL  25 mg as needed.     diphenoxylate-atropine 2.5-0.025 MG tablet  Commonly known as:  LOMOTIL  Take 1 tablet by mouth 4 (four) times daily as needed for diarrhea or loose stools.     escitalopram 10 MG tablet  Commonly  known as:  LEXAPRO  TAKE 1 TABLET BY MOUTH ONCE A DAY (TO REPLACE CELEXA, GENERIC CITALOPRAM)     glucose blood test strip  Commonly known as:  ACCU-CHEK AVIVA PLUS  USE TO CHECK BLOOD SUGAR ONCE A DAY OR AS INSTRUCTED     HYDROcodone-homatropine 5-1.5 MG/5ML syrup  Commonly known as:  HYCODAN  Take 5 mLs by mouth every 6 (six) hours as needed for cough.     hydrocortisone 2.5 % rectal cream  Commonly known as:  PROCTOZONE-HC  APPLY TO RECTAL AREA EVERY 6 HOURS AS NEEDED FOR HEMORRHOIDS     levothyroxine 137 MCG tablet  Commonly known as:  SYNTHROID, LEVOTHROID  Take 1 tablet (137 mcg total) by mouth daily before breakfast.     lisinopril-hydrochlorothiazide 10-12.5 MG tablet  Commonly known as:  ZESTORETIC   Take 1 tablet by mouth daily.     magnesium oxide 400 MG tablet  Commonly known as:  MAG-OX  400 mg.     pioglitazone 30 MG tablet  Commonly known as:  ACTOS  Take 1 tablet (30 mg total) by mouth daily.     propafenone 225 MG tablet  Commonly known as:  RYTHMOL  TAKE ONE TABLET BY MOUTH EVERY EIGHT HOURS     Rivaroxaban 15 MG Tabs tablet  Commonly known as:  XARELTO  Take 1 tablet (15 mg total) by mouth daily with supper.     rosuvastatin 20 MG tablet  Commonly known as:  CRESTOR  Take 1 tablet (20 mg total) by mouth daily.     TETRAHYDROZOLINE HCL OP  Apply to eye. Use as needed for redness           Objective:    BP 134/73 mmHg  Pulse 62  Temp(Src) 98 F (36.7 C) (Oral)  Ht 5\' 3"  (1.6 m)  Wt 172 lb 9.6 oz (78.291 kg)  BMI 30.58 kg/m2  SpO2 99%  Wt Readings from Last 3 Encounters:  09/19/15 172 lb 9.6 oz (78.291 kg)  08/29/15 171 lb 6.4 oz (77.747 kg)  08/23/15 169 lb 12.8 oz (77.021 kg)    Physical Exam  Constitutional: She is oriented to person, place, and time. She appears well-developed and well-nourished. No distress.  HENT:  Right Ear: Tympanic membrane, external ear and ear canal normal.  Left Ear: Tympanic membrane, external ear and ear canal normal.  Nose: Mucosal edema and rhinorrhea present. No epistaxis. Right sinus exhibits no maxillary sinus tenderness and no frontal sinus tenderness. Left sinus exhibits no maxillary sinus tenderness and no frontal sinus tenderness.  Mouth/Throat: Uvula is midline and mucous membranes are normal. Posterior oropharyngeal edema and posterior oropharyngeal erythema present. No oropharyngeal exudate or tonsillar abscesses.  Eyes: Conjunctivae and EOM are normal.  Neck: Neck supple. No thyromegaly present.  Cardiovascular: Normal rate, regular rhythm, normal heart sounds and intact distal pulses.   No murmur heard. Pulmonary/Chest: Effort normal and breath sounds normal. No respiratory distress. She has no wheezes.   Musculoskeletal: Normal range of motion. She exhibits no edema or tenderness.  Lymphadenopathy:    She has no cervical adenopathy.  Neurological: She is alert and oriented to person, place, and time. Coordination normal.  Skin: Skin is warm and dry. No rash noted. She is not diaphoretic.  Psychiatric: She has a normal mood and affect. Her behavior is normal.  Vitals reviewed.     Assessment & Plan:   Problem List Items Addressed This Visit    None    Visit  Diagnoses    Acute rhinosinusitis    -  Primary    Use Flonase and antihistamine for 3-4 days along with Hycodan, if not improved then pick up Omnicef    Relevant Medications    cefdinir (OMNICEF) 300 MG capsule       Follow up plan: Return if symptoms worsen or fail to improve.  Counseling provided for all of the vaccine components No orders of the defined types were placed in this encounter.    Caryl Pina, MD [facility] 09/19/2015, 11:39 AM

## 2015-11-08 DIAGNOSIS — E039 Hypothyroidism, unspecified: Secondary | ICD-10-CM | POA: Diagnosis not present

## 2015-11-08 DIAGNOSIS — R55 Syncope and collapse: Secondary | ICD-10-CM | POA: Insufficient documentation

## 2015-11-08 DIAGNOSIS — R002 Palpitations: Secondary | ICD-10-CM | POA: Diagnosis not present

## 2015-11-08 DIAGNOSIS — E119 Type 2 diabetes mellitus without complications: Secondary | ICD-10-CM | POA: Diagnosis not present

## 2015-11-08 DIAGNOSIS — I440 Atrioventricular block, first degree: Secondary | ICD-10-CM | POA: Diagnosis not present

## 2015-11-08 DIAGNOSIS — I10 Essential (primary) hypertension: Secondary | ICD-10-CM | POA: Diagnosis not present

## 2015-11-08 DIAGNOSIS — I340 Nonrheumatic mitral (valve) insufficiency: Secondary | ICD-10-CM | POA: Diagnosis not present

## 2015-11-08 DIAGNOSIS — I272 Other secondary pulmonary hypertension: Secondary | ICD-10-CM | POA: Diagnosis not present

## 2015-11-08 DIAGNOSIS — I480 Paroxysmal atrial fibrillation: Secondary | ICD-10-CM | POA: Diagnosis not present

## 2015-11-08 DIAGNOSIS — Z888 Allergy status to other drugs, medicaments and biological substances status: Secondary | ICD-10-CM | POA: Diagnosis not present

## 2015-11-08 DIAGNOSIS — Z7901 Long term (current) use of anticoagulants: Secondary | ICD-10-CM | POA: Diagnosis not present

## 2015-11-08 DIAGNOSIS — Z79899 Other long term (current) drug therapy: Secondary | ICD-10-CM | POA: Diagnosis not present

## 2015-11-08 DIAGNOSIS — Z881 Allergy status to other antibiotic agents status: Secondary | ICD-10-CM | POA: Diagnosis not present

## 2015-11-08 DIAGNOSIS — E785 Hyperlipidemia, unspecified: Secondary | ICD-10-CM | POA: Diagnosis not present

## 2015-11-08 HISTORY — DX: Syncope and collapse: R55

## 2015-11-16 ENCOUNTER — Other Ambulatory Visit: Payer: Self-pay | Admitting: Family Medicine

## 2015-11-21 ENCOUNTER — Ambulatory Visit (INDEPENDENT_AMBULATORY_CARE_PROVIDER_SITE_OTHER): Payer: Medicare Other | Admitting: Family Medicine

## 2015-11-21 ENCOUNTER — Encounter: Payer: Self-pay | Admitting: Family Medicine

## 2015-11-21 VITALS — BP 111/73 | HR 88 | Temp 97.1°F | Ht 63.0 in | Wt 171.2 lb

## 2015-11-21 DIAGNOSIS — D518 Other vitamin B12 deficiency anemias: Secondary | ICD-10-CM | POA: Diagnosis not present

## 2015-11-21 DIAGNOSIS — Z7901 Long term (current) use of anticoagulants: Secondary | ICD-10-CM

## 2015-11-21 DIAGNOSIS — E039 Hypothyroidism, unspecified: Secondary | ICD-10-CM | POA: Diagnosis not present

## 2015-11-21 DIAGNOSIS — E782 Mixed hyperlipidemia: Secondary | ICD-10-CM

## 2015-11-21 DIAGNOSIS — I10 Essential (primary) hypertension: Secondary | ICD-10-CM

## 2015-11-21 DIAGNOSIS — I4891 Unspecified atrial fibrillation: Secondary | ICD-10-CM

## 2015-11-21 DIAGNOSIS — E1169 Type 2 diabetes mellitus with other specified complication: Secondary | ICD-10-CM | POA: Diagnosis not present

## 2015-11-21 LAB — URINALYSIS
BILIRUBIN UA: NEGATIVE
Glucose, UA: NEGATIVE
Ketones, UA: NEGATIVE
Leukocytes, UA: NEGATIVE
NITRITE UA: NEGATIVE
PH UA: 7.5 (ref 5.0–7.5)
Protein, UA: NEGATIVE
Specific Gravity, UA: 1.02 (ref 1.005–1.030)
UUROB: 0.2 mg/dL (ref 0.2–1.0)

## 2015-11-21 LAB — BAYER DCA HB A1C WAIVED: HB A1C: 7.1 % — AB (ref <7.0)

## 2015-11-21 MED ORDER — FEXOFENADINE HCL 180 MG PO TABS: 180.0000 mg | ORAL_TABLET | Freq: Every day | ORAL | Status: DC

## 2015-11-21 MED ORDER — LISINOPRIL-HYDROCHLOROTHIAZIDE 20-25 MG PO TABS: 1.0000 | ORAL_TABLET | Freq: Every day | ORAL | Status: DC

## 2015-11-21 NOTE — Progress Notes (Signed)
Subjective:  Patient ID: Meredith Anderson, female    DOB: [birth year]  Age: 75 y.o. MRN: [id number]  CC: Hyperlipidemia; Hypertension; Diabetes; Hypothyroidism; and Depression   HPI Meredith Anderson presents for  follow-up of hypertension. Patient has no history of headache chest pain or shortness of breath or recent cough. Patient also denies symptoms of TIA such as numbness weakness lateralizing. Patient checks  blood pressure at home and has not had any elevated readings recently. Patient denies side effects from his medication. States taking it regularly.  Patient also  in for follow-up of elevated cholesterol. Doing well without complaints on current medication. Denies side effects of statin including myalgia and arthralgia and nausea. Also in today for liver function testing. Currently no chest pain, shortness of breath or other cardiovascular related symptoms noted.  Follow-up of diabetes. Patient does check blood sugar at home. Readings run between 100 and 140 Patient denies symptoms such as polyuria, polydipsia, excessive hunger, nausea No significant hypoglycemic spells noted. Gets shaky a couple hours after breakfast.)  Medications as noted below. Taking them regularly without complication/adverse reaction being reported today.   Recent lab at [facility] shows BNP of 166 and drop in hb to 10.8. Pt. Notes intermittent edema.  Patient presents for follow-up on  thyroid. The patient has a history of hypothyroidism for many years. It has been stable recently. Pt. denies any change in  voice, loss of hair, heat or cold intolerance. Energy level has been adequate to good. Patient denies constipation and diarrhea. No myxedema. Medication is as noted below. Verified that pt is taking it daily on an empty stomach. Well tolerated.  History Meredith Anderson has a past medical history of Hypertension; Hyperlipidemia; Dyshydrosis; NIDDM (non-insulin dependent diabetes mellitus); CA - cancer of parotid gland;  Menopause; Hemorrhoids; Arthritis; A-fib ([place]); Anxiety; Thyroid disease; Cataract; Osteopenia (2016); and Allergy.   She has past surgical history that includes Tubal ligation; bunionectomy bilateral; Abdominal hysterectomy; Eye surgery (Bilateral); Ablation; Rotator cuff repair (Right); and Lumbar disc surgery.   Her family history includes Atrial fibrillation in her father; Cancer in her sister, sister, and sister; Diabetes in her brother, mother, sister, sister, and sister; Heart attack (age of onset: 46) in her brother; Heart disease in her brother and father; Hyperlipidemia in her brother, sister, and sister; Hypertension in her brother; Stroke in her mother.She reports that she has never smoked. She has never used smokeless tobacco. She reports that she does not drink alcohol or use illicit drugs.  Current Outpatient Prescriptions on File Prior to Visit  Medication Sig Dispense Refill  . ACCU-CHEK AVIVA PLUS test strip USE TO CHECK BLOOD SUGAR ONCE A DAY OR AS INSTRUCTED 50 each 0  . Acetaminophen 500 MG coapsule     . bismuth subsalicylate (PEPTO BISMOL) 262 MG chewable tablet as needed. Reported on 07/13/2015    . cholecalciferol (VITAMIN D) 1000 UNITS tablet Take 1,000 Units by mouth daily.    Marland Kitchen conjugated estrogens (PREMARIN) vaginal cream Place 1 g vaginally 2 (two) times a week. Place vaginally twice a week.    . diclofenac sodium (VOLTAREN) 1 % GEL Apply 1 to 2 grams to area of pain up to qid as needed 100 g 2  . diltiazem (CARDIZEM CD) 120 MG 24 hr capsule TAKE 1 CAPSULE BY MOUTH ONCE   DAILY FOR HEART RATE 90 capsule 1  . diphenhydrAMINE (BENADRYL) 25 mg capsule 25 mg as needed.     . diphenoxylate-atropine (LOMOTIL) 2.5-0.025 MG tablet  Take 1 tablet by mouth 4 (four) times daily as needed for diarrhea or loose stools. 30 tablet 0  . escitalopram (LEXAPRO) 10 MG tablet TAKE 1 TABLET BY MOUTH ONCE A DAY (TO REPLACE CELEXA, GENERIC CITALOPRAM) (Patient taking differently: TAKE 1 TABLET  BY MOUTH ONCE A DAY (TO REPLACE CELEXA, GENERIC CITALOPRAM)  Takes 1/2 tablet at night) 90 tablet 3  . HYDROcodone-homatropine (HYCODAN) 5-1.5 MG/5ML syrup Take 5 mLs by mouth every 6 (six) hours as needed for cough. 120 mL 0  . hydrocortisone (PROCTOZONE-HC) 2.5 % rectal cream APPLY TO RECTAL AREA EVERY 6 HOURS AS NEEDED FOR HEMORRHOIDS 30 g 2  . levothyroxine (SYNTHROID, LEVOTHROID) 137 MCG tablet Take 1 tablet (137 mcg total) by mouth daily before breakfast. 90 tablet 3  . magnesium oxide (MAG-OX) 400 MG tablet 400 mg.    . pioglitazone (ACTOS) 30 MG tablet Take 1 tablet (30 mg total) by mouth daily. 90 tablet 3  . propafenone (RYTHMOL) 225 MG tablet TAKE ONE TABLET BY MOUTH EVERY EIGHT HOURS 270 tablet 1  . Rivaroxaban (XARELTO) 15 MG TABS tablet Take 1 tablet (15 mg total) by mouth daily with supper. 90 tablet 1  . rosuvastatin (CRESTOR) 20 MG tablet Take 1 tablet (20 mg total) by mouth daily. 90 tablet 3  . TETRAHYDROZOLINE HCL OP Apply to eye. Use as needed for redness      No current facility-administered medications on file prior to visit.    ROS Review of Systems  Constitutional: Negative for fever, activity change and appetite change.  HENT: Negative for congestion, rhinorrhea and sore throat.   Eyes: Negative for visual disturbance.  Respiratory: Negative for cough and shortness of breath.   Cardiovascular: Negative for chest pain and palpitations.  Gastrointestinal: Negative for nausea, abdominal pain and diarrhea.  Genitourinary: Negative for dysuria.  Musculoskeletal: Negative for myalgias and arthralgias.  Neurological: Positive for syncope (X1 walking to bathroom one night. Now wearing 2 weeks heart monitor from her cards MD).    Objective:  BP 111/73 mmHg  Pulse 88  Temp(Src) 97.1 F (36.2 C) (Oral)  Ht 5' 3"  (1.6 m)  Wt 171 lb 3.2 oz (77.656 kg)  BMI 30.33 kg/m2  SpO2 97%  BP Readings from Last 3 Encounters:  11/21/15 111/73  09/19/15 134/73  08/29/15  130/73    Wt Readings from Last 3 Encounters:  11/21/15 171 lb 3.2 oz (77.656 kg)  09/19/15 172 lb 9.6 oz (78.291 kg)  08/29/15 171 lb 6.4 oz (77.747 kg)     Physical Exam  Constitutional: She is oriented to person, place, and time. She appears well-developed and well-nourished. No distress.  HENT:  Head: Normocephalic and atraumatic.  Right Ear: External ear normal.  Left Ear: External ear normal.  Nose: Nose normal.  Mouth/Throat: Oropharynx is clear and moist.  Eyes: Conjunctivae and EOM are normal. Pupils are equal, round, and reactive to light.  Neck: Normal range of motion. Neck supple. No thyromegaly present.  Cardiovascular: Normal rate, regular rhythm and normal heart sounds.   No murmur heard. Pulmonary/Chest: Effort normal. No respiratory distress. She has no wheezes. She has rales (at bases.).  Abdominal: Soft. Bowel sounds are normal. She exhibits no distension. There is no tenderness.  Lymphadenopathy:    She has no cervical adenopathy.  Neurological: She is alert and oriented to person, place, and time. She has normal reflexes.  Skin: Skin is warm and dry.  Psychiatric: She has a normal mood and affect. Her behavior is  normal. Judgment and thought content normal.    Lab Results  Component Value Date   HGBA1C 6.9 08/23/2015   HGBA1C 6.9 04/29/2015   HGBA1C 6.6 11/24/2014    Lab Results  Component Value Date   WBC 3.5 08/23/2015   HGB 12.0* 11/24/2014   HCT 37.0 08/23/2015   PLT 228 08/23/2015   GLUCOSE 137* 08/23/2015   CHOL 117 08/23/2015   TRIG 164* 08/23/2015   HDL 40 08/23/2015   LDLCALC 44 08/23/2015   ALT 18 08/23/2015   AST 18 08/23/2015   NA 132* 08/23/2015   K 3.4* 08/23/2015   CL 90* 08/23/2015   CREATININE 1.22* 08/23/2015   BUN 20 08/23/2015   CO2 22 08/23/2015   TSH 2.750 08/23/2015   INR 1.8 07/13/2015   HGBA1C 6.9 08/23/2015    No results found.  Assessment & Plan:   Meredith Anderson was seen today for hyperlipidemia, hypertension,  diabetes, hypothyroidism and depression.  Diagnoses and all orders for this visit:  Atrial fibrillation, unspecified type ([place]) -     CBC with Differential/Platelet -     CMP14+EGFR  Type 2 diabetes mellitus with other specified complication, without long-term current use of insulin ([facility]) -     CBC with Differential/Platelet -     CMP14+EGFR -     Bayer DCA Hb A1c Waived; Standing -     Microalbumin / creatinine urine ratio -     Urinalysis -     Bayer DCA Hb A1c Waived  Elevated cholesterol with high triglycerides -     CBC with Differential/Platelet -     CMP14+EGFR -     Lipid panel  Essential hypertension, benign -     CBC with Differential/Platelet -     CMP14+EGFR  Long term (current) use of anticoagulants -     CBC with Differential/Platelet -     CMP14+EGFR  Hypothyroidism, unspecified hypothyroidism type -     CBC with Differential/Platelet -     CMP14+EGFR -     TSH + free T4  Other vitamin B12 deficiency anemia -     Vitamin B12  Other orders -     fexofenadine (ALLEGRA) 180 MG tablet; Take 1 tablet (180 mg total) by mouth daily. For allergy symptoms -     lisinopril-hydrochlorothiazide (PRINZIDE,ZESTORETIC) 20-25 MG tablet; Take 1 tablet by mouth daily.   I have discontinued Ms. Paparella's lisinopril-hydrochlorothiazide and cefdinir. I am also having her start on fexofenadine and lisinopril-hydrochlorothiazide. Additionally, I am having her maintain her TETRAHYDROZOLINE HCL OP, conjugated estrogens, magnesium oxide, diphenhydrAMINE, bismuth subsalicylate, hydrocortisone, cholecalciferol, rosuvastatin, diclofenac sodium, diltiazem, escitalopram, levothyroxine, propafenone, Acetaminophen, pioglitazone, Rivaroxaban, HYDROcodone-homatropine, diphenoxylate-atropine, and ACCU-CHEK AVIVA PLUS.  Meds ordered this encounter  Medications  . fexofenadine (ALLEGRA) 180 MG tablet    Sig: Take 1 tablet (180 mg total) by mouth daily. For allergy symptoms    Dispense:  30  tablet    Refill:  11  . lisinopril-hydrochlorothiazide (PRINZIDE,ZESTORETIC) 20-25 MG tablet    Sig: Take 1 tablet by mouth daily.    Dispense:  90 tablet    Refill:  3     Follow-up: Return in about 3 months (around 02/21/2016).  Claretta Fraise, M.D.

## 2015-11-22 LAB — CBC WITH DIFFERENTIAL/PLATELET
BASOS: 0 %
Basophils Absolute: 0 10*3/uL (ref 0.0–0.2)
EOS (ABSOLUTE): 0.2 10*3/uL (ref 0.0–0.4)
EOS: 4 %
HEMOGLOBIN: 12.2 g/dL (ref 11.1–15.9)
Hematocrit: 37.4 % (ref 34.0–46.6)
IMMATURE GRANULOCYTES: 0 %
Immature Grans (Abs): 0 10*3/uL (ref 0.0–0.1)
LYMPHS: 15 %
Lymphocytes Absolute: 1 10*3/uL (ref 0.7–3.1)
MCH: 30 pg (ref 26.6–33.0)
MCHC: 32.6 g/dL (ref 31.5–35.7)
MCV: 92 fL (ref 79–97)
MONOCYTES: 7 %
Monocytes Absolute: 0.5 10*3/uL (ref 0.1–0.9)
NEUTROS ABS: 4.7 10*3/uL (ref 1.4–7.0)
NEUTROS PCT: 74 %
Platelets: 220 10*3/uL (ref 150–379)
RBC: 4.07 x10E6/uL (ref 3.77–5.28)
RDW: 14.9 % (ref 12.3–15.4)
WBC: 6.4 10*3/uL (ref 3.4–10.8)

## 2015-11-22 LAB — MICROALBUMIN / CREATININE URINE RATIO
CREATININE, UR: 61.9 mg/dL
MICROALB/CREAT RATIO: 11.3 mg/g creat (ref 0.0–30.0)
MICROALBUM., U, RANDOM: 7 ug/mL

## 2015-11-22 LAB — TSH+FREE T4
FREE T4: 1.42 ng/dL (ref 0.82–1.77)
TSH: 2.8 u[IU]/mL (ref 0.450–4.500)

## 2015-11-22 LAB — CMP14+EGFR
ALK PHOS: 85 IU/L (ref 39–117)
ALT: 17 IU/L (ref 0–32)
AST: 23 IU/L (ref 0–40)
Albumin/Globulin Ratio: 1.5 (ref 1.2–2.2)
Albumin: 4.6 g/dL (ref 3.5–4.8)
BUN/Creatinine Ratio: 18 (ref 12–28)
BUN: 19 mg/dL (ref 8–27)
Bilirubin Total: 0.4 mg/dL (ref 0.0–1.2)
CALCIUM: 9.9 mg/dL (ref 8.7–10.3)
CO2: 23 mmol/L (ref 18–29)
CREATININE: 1.08 mg/dL — AB (ref 0.57–1.00)
Chloride: 96 mmol/L (ref 96–106)
GFR calc Af Amer: 58 mL/min/{1.73_m2} — ABNORMAL LOW (ref >59)
GFR calc non Af Amer: 51 mL/min/{1.73_m2} — ABNORMAL LOW (ref >59)
GLUCOSE: 125 mg/dL — AB (ref 65–99)
Globulin, Total: 3.1 g/dL (ref 1.5–4.5)
Potassium: 4.2 mmol/L (ref 3.5–5.2)
Sodium: 137 mmol/L (ref 134–144)
Total Protein: 7.7 g/dL (ref 6.0–8.5)

## 2015-11-22 LAB — LIPID PANEL
CHOL/HDL RATIO: 3.9 ratio (ref 0.0–4.4)
CHOLESTEROL TOTAL: 128 mg/dL (ref 100–199)
HDL: 33 mg/dL — ABNORMAL LOW (ref >39)
LDL Calculated: 47 mg/dL (ref 0–99)
TRIGLYCERIDES: 239 mg/dL — AB (ref 0–149)
VLDL Cholesterol Cal: 48 mg/dL — ABNORMAL HIGH (ref 5–40)

## 2015-11-22 LAB — VITAMIN B12: Vitamin B-12: 515 pg/mL (ref 211–946)

## 2015-11-22 NOTE — Progress Notes (Signed)
Patient aware.

## 2015-12-12 DIAGNOSIS — F32A Depression, unspecified: Secondary | ICD-10-CM

## 2015-12-12 DIAGNOSIS — F419 Anxiety disorder, unspecified: Secondary | ICD-10-CM | POA: Insufficient documentation

## 2015-12-12 DIAGNOSIS — F329 Major depressive disorder, single episode, unspecified: Secondary | ICD-10-CM | POA: Insufficient documentation

## 2015-12-12 HISTORY — DX: Depression, unspecified: F32.A

## 2015-12-14 DIAGNOSIS — I471 Supraventricular tachycardia: Secondary | ICD-10-CM | POA: Diagnosis not present

## 2015-12-14 DIAGNOSIS — I455 Other specified heart block: Secondary | ICD-10-CM | POA: Diagnosis not present

## 2015-12-14 DIAGNOSIS — J9811 Atelectasis: Secondary | ICD-10-CM | POA: Diagnosis not present

## 2015-12-14 DIAGNOSIS — F419 Anxiety disorder, unspecified: Secondary | ICD-10-CM | POA: Diagnosis not present

## 2015-12-14 DIAGNOSIS — F329 Major depressive disorder, single episode, unspecified: Secondary | ICD-10-CM | POA: Diagnosis not present

## 2015-12-14 DIAGNOSIS — I119 Hypertensive heart disease without heart failure: Secondary | ICD-10-CM | POA: Diagnosis not present

## 2015-12-14 DIAGNOSIS — E119 Type 2 diabetes mellitus without complications: Secondary | ICD-10-CM | POA: Diagnosis not present

## 2015-12-14 DIAGNOSIS — I480 Paroxysmal atrial fibrillation: Secondary | ICD-10-CM | POA: Diagnosis not present

## 2015-12-14 DIAGNOSIS — I495 Sick sinus syndrome: Secondary | ICD-10-CM | POA: Diagnosis not present

## 2015-12-14 DIAGNOSIS — E039 Hypothyroidism, unspecified: Secondary | ICD-10-CM | POA: Diagnosis not present

## 2015-12-14 DIAGNOSIS — E785 Hyperlipidemia, unspecified: Secondary | ICD-10-CM | POA: Diagnosis not present

## 2015-12-14 DIAGNOSIS — Z950 Presence of cardiac pacemaker: Secondary | ICD-10-CM | POA: Diagnosis not present

## 2015-12-14 DIAGNOSIS — R55 Syncope and collapse: Secondary | ICD-10-CM | POA: Diagnosis not present

## 2015-12-14 DIAGNOSIS — Z7901 Long term (current) use of anticoagulants: Secondary | ICD-10-CM | POA: Diagnosis not present

## 2015-12-14 DIAGNOSIS — K219 Gastro-esophageal reflux disease without esophagitis: Secondary | ICD-10-CM | POA: Diagnosis not present

## 2015-12-14 DIAGNOSIS — I340 Nonrheumatic mitral (valve) insufficiency: Secondary | ICD-10-CM | POA: Diagnosis not present

## 2015-12-14 HISTORY — PX: PACEMAKER INSERTION: SHX728

## 2015-12-15 DIAGNOSIS — Z7901 Long term (current) use of anticoagulants: Secondary | ICD-10-CM | POA: Diagnosis not present

## 2015-12-15 DIAGNOSIS — K219 Gastro-esophageal reflux disease without esophagitis: Secondary | ICD-10-CM | POA: Diagnosis not present

## 2015-12-15 DIAGNOSIS — I340 Nonrheumatic mitral (valve) insufficiency: Secondary | ICD-10-CM | POA: Diagnosis not present

## 2015-12-15 DIAGNOSIS — E785 Hyperlipidemia, unspecified: Secondary | ICD-10-CM | POA: Diagnosis not present

## 2015-12-15 DIAGNOSIS — Z950 Presence of cardiac pacemaker: Secondary | ICD-10-CM | POA: Diagnosis not present

## 2015-12-15 DIAGNOSIS — I480 Paroxysmal atrial fibrillation: Secondary | ICD-10-CM | POA: Diagnosis not present

## 2015-12-15 DIAGNOSIS — I495 Sick sinus syndrome: Secondary | ICD-10-CM | POA: Diagnosis not present

## 2015-12-15 DIAGNOSIS — I455 Other specified heart block: Secondary | ICD-10-CM | POA: Diagnosis not present

## 2015-12-15 DIAGNOSIS — E039 Hypothyroidism, unspecified: Secondary | ICD-10-CM | POA: Diagnosis not present

## 2015-12-15 DIAGNOSIS — E119 Type 2 diabetes mellitus without complications: Secondary | ICD-10-CM | POA: Diagnosis not present

## 2015-12-15 DIAGNOSIS — I471 Supraventricular tachycardia: Secondary | ICD-10-CM | POA: Diagnosis not present

## 2015-12-15 DIAGNOSIS — I119 Hypertensive heart disease without heart failure: Secondary | ICD-10-CM | POA: Diagnosis not present

## 2015-12-15 DIAGNOSIS — R55 Syncope and collapse: Secondary | ICD-10-CM | POA: Diagnosis not present

## 2015-12-22 DIAGNOSIS — R072 Precordial pain: Secondary | ICD-10-CM | POA: Diagnosis not present

## 2015-12-22 DIAGNOSIS — Z79899 Other long term (current) drug therapy: Secondary | ICD-10-CM | POA: Diagnosis not present

## 2015-12-22 DIAGNOSIS — I480 Paroxysmal atrial fibrillation: Secondary | ICD-10-CM | POA: Diagnosis not present

## 2015-12-22 DIAGNOSIS — Z9889 Other specified postprocedural states: Secondary | ICD-10-CM | POA: Diagnosis not present

## 2015-12-22 DIAGNOSIS — I4891 Unspecified atrial fibrillation: Secondary | ICD-10-CM | POA: Diagnosis not present

## 2015-12-22 DIAGNOSIS — R0789 Other chest pain: Secondary | ICD-10-CM | POA: Diagnosis not present

## 2015-12-22 DIAGNOSIS — I119 Hypertensive heart disease without heart failure: Secondary | ICD-10-CM | POA: Diagnosis not present

## 2015-12-22 DIAGNOSIS — E119 Type 2 diabetes mellitus without complications: Secondary | ICD-10-CM | POA: Diagnosis not present

## 2015-12-22 DIAGNOSIS — R002 Palpitations: Secondary | ICD-10-CM | POA: Diagnosis not present

## 2015-12-22 DIAGNOSIS — I495 Sick sinus syndrome: Secondary | ICD-10-CM | POA: Diagnosis not present

## 2015-12-22 DIAGNOSIS — Z888 Allergy status to other drugs, medicaments and biological substances status: Secondary | ICD-10-CM | POA: Diagnosis not present

## 2015-12-22 DIAGNOSIS — E785 Hyperlipidemia, unspecified: Secondary | ICD-10-CM | POA: Diagnosis not present

## 2015-12-22 DIAGNOSIS — I4581 Long QT syndrome: Secondary | ICD-10-CM | POA: Diagnosis not present

## 2015-12-22 DIAGNOSIS — E039 Hypothyroidism, unspecified: Secondary | ICD-10-CM | POA: Diagnosis not present

## 2015-12-22 DIAGNOSIS — Z950 Presence of cardiac pacemaker: Secondary | ICD-10-CM | POA: Diagnosis not present

## 2015-12-22 DIAGNOSIS — J309 Allergic rhinitis, unspecified: Secondary | ICD-10-CM | POA: Diagnosis not present

## 2015-12-22 DIAGNOSIS — R42 Dizziness and giddiness: Secondary | ICD-10-CM | POA: Diagnosis not present

## 2015-12-22 DIAGNOSIS — I471 Supraventricular tachycardia: Secondary | ICD-10-CM | POA: Diagnosis not present

## 2015-12-22 DIAGNOSIS — I472 Ventricular tachycardia: Secondary | ICD-10-CM | POA: Diagnosis not present

## 2015-12-22 DIAGNOSIS — Z7901 Long term (current) use of anticoagulants: Secondary | ICD-10-CM | POA: Diagnosis not present

## 2015-12-23 DIAGNOSIS — J309 Allergic rhinitis, unspecified: Secondary | ICD-10-CM | POA: Diagnosis not present

## 2015-12-23 DIAGNOSIS — E039 Hypothyroidism, unspecified: Secondary | ICD-10-CM | POA: Diagnosis not present

## 2015-12-23 DIAGNOSIS — I471 Supraventricular tachycardia: Secondary | ICD-10-CM | POA: Diagnosis not present

## 2015-12-23 DIAGNOSIS — I472 Ventricular tachycardia: Secondary | ICD-10-CM | POA: Diagnosis not present

## 2015-12-23 DIAGNOSIS — R0789 Other chest pain: Secondary | ICD-10-CM | POA: Diagnosis not present

## 2015-12-23 DIAGNOSIS — I4581 Long QT syndrome: Secondary | ICD-10-CM | POA: Diagnosis not present

## 2015-12-24 DIAGNOSIS — I495 Sick sinus syndrome: Secondary | ICD-10-CM | POA: Diagnosis not present

## 2015-12-24 DIAGNOSIS — I4891 Unspecified atrial fibrillation: Secondary | ICD-10-CM | POA: Diagnosis not present

## 2015-12-24 DIAGNOSIS — R079 Chest pain, unspecified: Secondary | ICD-10-CM | POA: Diagnosis not present

## 2015-12-24 DIAGNOSIS — R002 Palpitations: Secondary | ICD-10-CM | POA: Diagnosis not present

## 2015-12-26 ENCOUNTER — Encounter: Payer: Medicare Other | Admitting: *Deleted

## 2016-01-02 ENCOUNTER — Ambulatory Visit (INDEPENDENT_AMBULATORY_CARE_PROVIDER_SITE_OTHER): Payer: Medicare Other | Admitting: Family

## 2016-01-02 ENCOUNTER — Encounter: Payer: Self-pay | Admitting: Family

## 2016-01-02 VITALS — BP 127/81 | HR 84 | Temp 97.4°F | Ht 63.0 in | Wt 171.4 lb

## 2016-01-02 DIAGNOSIS — R609 Edema, unspecified: Secondary | ICD-10-CM | POA: Diagnosis not present

## 2016-01-02 MED ORDER — FUROSEMIDE 20 MG PO TABS: 20.0000 mg | ORAL_TABLET | Freq: Every day | ORAL | Status: DC

## 2016-01-02 NOTE — Progress Notes (Signed)
   Subjective:    Patient ID: Meredith Anderson, female    DOB: 06-04-1941, 75 y.o.   MRN: [id number]  HPI Pt presents to the office today with bilateral swelling in her ankles. Pt states this started about a week ago. PT went to the ED for palpitations and A Fib. PT's BP was low at that time and took her off of her lisinopril-HCTZ. Pt states since being discharged every evening she has moderate amount of swelling in both ankles. 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   Review of Systems  Respiratory: Negative for apnea, chest tightness, shortness of breath and stridor.   Cardiovascular: Positive for palpitations and leg swelling.  Gastrointestinal: Negative.   Genitourinary: Negative.   Musculoskeletal: Negative.        Objective:   Physical Exam  Constitutional: She is oriented to person, place, and time. She appears well-developed and well-nourished. No distress.  HENT:  Head: Normocephalic.  Eyes: Pupils are equal, round, and reactive to light.  Cardiovascular: Normal rate, regular rhythm, normal heart sounds and intact distal pulses.   No murmur heard. Pulmonary/Chest: Effort normal and breath sounds normal. No respiratory distress. She has no wheezes.  Abdominal: Soft. Bowel sounds are normal. She exhibits no distension. There is no tenderness.  Musculoskeletal: Normal range of motion. She exhibits edema (trace in bilateral ankles). She exhibits no tenderness.  Neurological: She is alert and oriented to person, place, and time.  Skin: Skin is warm and dry.  Psychiatric: She has a normal mood and affect. Her behavior is normal. Judgment and thought content normal.  Vitals reviewed.   BP 127/81 mmHg  Pulse 84  Temp(Src) 97.4 F (36.3 C) (Oral)  Ht 5\' 3"  (1.6 m)  Wt 171 lb 6.4 oz (77.747 kg)  BMI 30.37 kg/m2       Assessment & Plan:  1. Peripheral edema -Compression hose -Keep elevated when possible -Low salt diet -Pt told to only take lasix if she has  edema  -RTO in 2 weeks - furosemide (LASIX) 20 MG tablet; Take 1 tablet (20 mg total) by mouth daily.  Dispense: 30 tablet; Refill: [place], FNP

## 2016-01-02 NOTE — Patient Instructions (Signed)

## 2016-01-09 ENCOUNTER — Ambulatory Visit (INDEPENDENT_AMBULATORY_CARE_PROVIDER_SITE_OTHER): Payer: Medicare Other | Admitting: Family

## 2016-01-09 ENCOUNTER — Encounter: Payer: Self-pay | Admitting: Family

## 2016-01-09 VITALS — BP 121/76 | HR 73 | Temp 97.1°F | Ht 63.0 in | Wt 173.6 lb

## 2016-01-09 DIAGNOSIS — R609 Edema, unspecified: Secondary | ICD-10-CM | POA: Insufficient documentation

## 2016-01-09 MED ORDER — HYDROCHLOROTHIAZIDE 12.5 MG PO TABS: 12.5000 mg | ORAL_TABLET | Freq: Every day | ORAL | Status: DC

## 2016-01-09 NOTE — Patient Instructions (Signed)

## 2016-01-09 NOTE — Progress Notes (Addendum)
   Subjective:    Patient ID: Meredith Anderson, female    DOB: 03-16-1941, 75 y.o.   MRN: [id number]  HPI Pt presents to the office today with recurrent bilateral swelling in her ankles. PT went to the ED for palpitations and A Fib. PT's BP was low at that time and took her off of her lisinopril-HCTZ. Pt states since being discharged every evening she has moderate amount of swelling in both ankles and the swelling is worse in the evening. 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  Pt was seen in the office one week ago and started on Lasix 20 mg as needed for swelling. PT states this is not helping and in the even she has "so much swelling it is hard to walk". PT states she has been taking the lasix every AM. Pt has follow up appt with Cardiologists in August. PT states she has not tried compression hose.    Review of Systems  Respiratory: Negative for apnea, chest tightness, shortness of breath and stridor.   Cardiovascular: Positive for palpitations and leg swelling.  Gastrointestinal: Negative.   Genitourinary: Negative.   Musculoskeletal: Negative.        Objective:   Physical Exam  Constitutional: She is oriented to person, place, and time. She appears well-developed and well-nourished. No distress.  HENT:  Head: Normocephalic.  Eyes: Pupils are equal, round, and reactive to light.  Cardiovascular: Normal rate, regular rhythm, normal heart sounds and intact distal pulses.   No murmur heard. Pulmonary/Chest: Effort normal and breath sounds normal. No respiratory distress. She has no wheezes.  Abdominal: Soft. Bowel sounds are normal. She exhibits no distension. There is no tenderness.  Musculoskeletal: Normal range of motion. She exhibits edema (trace in bilateral ankles). She exhibits no tenderness.  Neurological: She is alert and oriented to person, place, and time.  Skin: Skin is warm and dry.  Psychiatric: She has a normal mood and affect. Her behavior is normal.  Judgment and thought content normal.  Vitals reviewed.   BP 121/76 mmHg  Pulse 73  Temp(Src) 97.1 F (36.2 C) (Oral)  Ht 5\' 3"  (1.6 m)  Wt 173 lb 9.6 oz (78.744 kg)  BMI 30.76 kg/m2       Assessment & Plan:  1. Peripheral edema -Compression hose -Keep elevated when possible -PT started on HCTZ 12.5 mg daily. Pt states this seemed to work better than lasix. RTO in 2 weeks to recheck blood pressure -Keep appt with Cardiologists - hydrochlorothiazide HYDRODIURIL) 12.5 MG tablet; Take 1 tablet (12.5 mg total) by mouth daily.  Dispense: 90 tablet; Refill: 3 - Brain natriuretic peptide  Evelina Dun, FNP

## 2016-01-10 ENCOUNTER — Other Ambulatory Visit: Payer: Self-pay | Admitting: Family Medicine

## 2016-01-10 LAB — BRAIN NATRIURETIC PEPTIDE: BNP: 132.7 pg/mL — ABNORMAL HIGH (ref 0.0–100.0)

## 2016-01-17 ENCOUNTER — Ambulatory Visit: Payer: Medicare Other | Admitting: Family

## 2016-01-17 DIAGNOSIS — I480 Paroxysmal atrial fibrillation: Secondary | ICD-10-CM | POA: Diagnosis not present

## 2016-01-17 DIAGNOSIS — E785 Hyperlipidemia, unspecified: Secondary | ICD-10-CM | POA: Diagnosis not present

## 2016-01-17 DIAGNOSIS — Z950 Presence of cardiac pacemaker: Secondary | ICD-10-CM | POA: Diagnosis not present

## 2016-01-17 DIAGNOSIS — Z79899 Other long term (current) drug therapy: Secondary | ICD-10-CM | POA: Diagnosis not present

## 2016-01-17 DIAGNOSIS — E039 Hypothyroidism, unspecified: Secondary | ICD-10-CM | POA: Diagnosis not present

## 2016-01-17 DIAGNOSIS — Z888 Allergy status to other drugs, medicaments and biological substances status: Secondary | ICD-10-CM | POA: Diagnosis not present

## 2016-01-17 DIAGNOSIS — I10 Essential (primary) hypertension: Secondary | ICD-10-CM | POA: Diagnosis not present

## 2016-01-17 DIAGNOSIS — E119 Type 2 diabetes mellitus without complications: Secondary | ICD-10-CM | POA: Diagnosis not present

## 2016-01-17 DIAGNOSIS — Z9889 Other specified postprocedural states: Secondary | ICD-10-CM | POA: Diagnosis not present

## 2016-01-25 ENCOUNTER — Ambulatory Visit (INDEPENDENT_AMBULATORY_CARE_PROVIDER_SITE_OTHER): Payer: Medicare Other | Admitting: Family

## 2016-01-25 ENCOUNTER — Encounter: Payer: Self-pay | Admitting: Family

## 2016-01-25 VITALS — BP 131/78 | HR 66 | Temp 97.8°F | Ht 63.0 in | Wt 174.0 lb

## 2016-01-25 DIAGNOSIS — I10 Essential (primary) hypertension: Secondary | ICD-10-CM

## 2016-01-25 DIAGNOSIS — R609 Edema, unspecified: Secondary | ICD-10-CM | POA: Diagnosis not present

## 2016-01-25 MED ORDER — HYDROCHLOROTHIAZIDE 25 MG PO TABS: 25.0000 mg | ORAL_TABLET | Freq: Every day | ORAL | 3 refills | Status: DC

## 2016-01-25 NOTE — Patient Instructions (Signed)
Edema °Edema is an abnormal buildup of fluids in your body tissues. Edema is somewhat dependent on gravity to pull the fluid to the lowest place in your body. That makes the condition more common in the legs and thighs (lower extremities). Painless swelling of the feet and ankles is common and becomes more likely as you get older. It is also common in looser tissues, like around your eyes.  °When the affected area is squeezed, the fluid may move out of that spot and leave a dent for a few moments. This dent is called pitting.  °CAUSES  °There are many possible causes of edema. Eating too much salt and being on your feet or sitting for a long time can cause edema in your legs and ankles. Hot weather may make edema worse. Common medical causes of edema include: °· Heart failure. °· Liver disease. °· Kidney disease. °· Weak blood vessels in your legs. °· Cancer. °· An injury. °· Pregnancy. °· Some medications. °· Obesity.  °SYMPTOMS  °Edema is usually painless. Your skin may look swollen or shiny.  °DIAGNOSIS  °Your health care provider may be able to diagnose edema by asking about your medical history and doing a physical exam. You may need to have tests such as X-rays, an electrocardiogram, or blood tests to check for medical conditions that may cause edema.  °TREATMENT  °Edema treatment depends on the cause. If you have heart, liver, or kidney disease, you need the treatment appropriate for these conditions. General treatment may include: °· Elevation of the affected body part above the level of your heart. °· Compression of the affected body part. Pressure from elastic bandages or support stockings squeezes the tissues and forces fluid back into the blood vessels. This keeps fluid from entering the tissues. °· Restriction of fluid and salt intake. °· Use of a water pill (diuretic). These medications are appropriate only for some types of edema. They pull fluid out of your body and make you urinate more often. This  gets rid of fluid and reduces swelling, but diuretics can have side effects. Only use diuretics as directed by your health care provider. °HOME CARE INSTRUCTIONS  °· Keep the affected body part above the level of your heart when you are lying down.   °· Do not sit still or stand for prolonged periods.   °· Do not put anything directly under your knees when lying down. °· Do not wear constricting clothing or garters on your upper legs.   °· Exercise your legs to work the fluid back into your blood vessels. This may help the swelling go down.   °· Wear elastic bandages or support stockings to reduce ankle swelling as directed by your health care provider.   °· Eat a low-salt diet to reduce fluid if your health care provider recommends it.   °· Only take medicines as directed by your health care provider.  °SEEK MEDICAL CARE IF:  °· Your edema is not responding to treatment. °· You have heart, liver, or kidney disease and notice symptoms of edema. °· You have edema in your legs that does not improve after elevating them.   °· You have sudden and unexplained weight gain. °SEEK IMMEDIATE MEDICAL CARE IF:  °· You develop shortness of breath or chest pain.   °· You cannot breathe when you lie down. °· You develop pain, redness, or warmth in the swollen areas.   °· You have heart, liver, or kidney disease and suddenly get edema. °· You have a fever and your symptoms suddenly get worse. °MAKE SURE YOU:  °·   Understand these instructions. °· Will watch your condition. °· Will get help right away if you are not doing well or get worse. °  °This information is not intended to replace advice given to you by your health care provider. Make sure you discuss any questions you have with your health care provider. °  °Document Released: 06/18/2005 Document Revised: 07/09/2014 Document Reviewed: 04/10/2013 °Elsevier Interactive Patient Education ©2016 [employer] ° °

## 2016-01-25 NOTE — Progress Notes (Signed)
   Subjective:    Patient ID: Meredith Anderson, female    DOB: 02-May-1941, 75 y.o.   MRN: [id number]  HPI Pt presents to the office today with recurrent bilateral swelling in her ankles. PT went to the ED for palpitations and A Fib. PT's BP was low at that time and took her off of her lisinopril-HCTZ. Pt states since being discharged every evening she has moderate amount of swelling in both ankles and the swelling is worse in the evening. 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  Then patent was seen in the clinic and  started on Lasix 20 mg as needed for swelling and this did not help. Then pt requested to go back to her HCTZ and it was reordered at 12.5 mg. PT states the swelling became worse and she increased it to 25 mg daily.  PT states this still is not helping. Pt saw her  Cardiologists on 01/17/16 and stated he was not concerned about the swelling. PT states she has not tried compression hose.   Review of Systems  Respiratory: Negative for apnea, chest tightness, shortness of breath and stridor.   Cardiovascular: Positive for leg swelling. Negative for palpitations.  Gastrointestinal: Negative.   Genitourinary: Negative.   Musculoskeletal: Negative.        Objective:   Physical Exam  Constitutional: She is oriented to person, place, and time. She appears well-developed and well-nourished. No distress.  HENT:  Head: Normocephalic.  Eyes: Pupils are equal, round, and reactive to light.  Cardiovascular: Normal rate, regular rhythm, normal heart sounds and intact distal pulses.   No murmur heard. Pulmonary/Chest: Effort normal and breath sounds normal. No respiratory distress. She has no wheezes.  Abdominal: Soft. Bowel sounds are normal. She exhibits no distension. There is no tenderness.  Musculoskeletal: Normal range of motion. She exhibits edema (trace in bilateral ankles). She exhibits no tenderness.  Neurological: She is alert and oriented to person, place, and time.    Skin: Skin is warm and dry.  Psychiatric: She has a normal mood and affect. Her behavior is normal. Judgment and thought content normal.  Vitals reviewed.   BP 131/78   Pulse 66   Temp 97.8 F (36.6 C) (Oral)   Ht 5\' 3"  (1.6 m)   Wt 174 lb (78.9 kg)   BMI 30.82 kg/m        Assessment & Plan:  1. Essential hypertension, benign - hydrochlorothiazide (HYDRODIURIL) 25 MG tablet; Take 1 tablet (25 mg total) by mouth daily.  Dispense: 90 tablet; Refill: 3  2. Peripheral edema -Compression hose -Keep elevated when possible -Low salt diet -Keep Cardiologists appt, and discuss edema. Pt would like to decrease Caredizem back to 240mg  from 360 mg. PT told to discuss this with her Cardiologists  - hydrochlorothiazide (HYDRODIURIL) 25 MG tablet; Take 1 tablet (25 mg total) by mouth daily.  Dispense: 90 tablet; Refill: [place], FNP

## 2016-02-01 DIAGNOSIS — I340 Nonrheumatic mitral (valve) insufficiency: Secondary | ICD-10-CM | POA: Diagnosis not present

## 2016-02-01 DIAGNOSIS — I272 Other secondary pulmonary hypertension: Secondary | ICD-10-CM | POA: Diagnosis not present

## 2016-02-01 DIAGNOSIS — Z950 Presence of cardiac pacemaker: Secondary | ICD-10-CM | POA: Diagnosis not present

## 2016-02-01 DIAGNOSIS — I480 Paroxysmal atrial fibrillation: Secondary | ICD-10-CM | POA: Diagnosis not present

## 2016-02-01 DIAGNOSIS — I10 Essential (primary) hypertension: Secondary | ICD-10-CM | POA: Diagnosis not present

## 2016-02-21 ENCOUNTER — Ambulatory Visit (INDEPENDENT_AMBULATORY_CARE_PROVIDER_SITE_OTHER): Payer: Medicare Other | Admitting: Family Medicine

## 2016-02-21 ENCOUNTER — Encounter: Payer: Self-pay | Admitting: Family Medicine

## 2016-02-21 VITALS — BP 121/71 | Temp 97.8°F | Ht 63.0 in | Wt 173.8 lb

## 2016-02-21 DIAGNOSIS — Z7901 Long term (current) use of anticoagulants: Secondary | ICD-10-CM

## 2016-02-21 DIAGNOSIS — I4891 Unspecified atrial fibrillation: Secondary | ICD-10-CM | POA: Diagnosis not present

## 2016-02-21 DIAGNOSIS — E039 Hypothyroidism, unspecified: Secondary | ICD-10-CM

## 2016-02-21 DIAGNOSIS — E1169 Type 2 diabetes mellitus with other specified complication: Secondary | ICD-10-CM

## 2016-02-21 DIAGNOSIS — I10 Essential (primary) hypertension: Secondary | ICD-10-CM

## 2016-02-21 DIAGNOSIS — E782 Mixed hyperlipidemia: Secondary | ICD-10-CM | POA: Diagnosis not present

## 2016-02-21 LAB — URINALYSIS
Bilirubin, UA: NEGATIVE
GLUCOSE, UA: NEGATIVE
KETONES UA: NEGATIVE
LEUKOCYTES UA: NEGATIVE
Nitrite, UA: NEGATIVE
PROTEIN UA: NEGATIVE
Specific Gravity, UA: 1.015 (ref 1.005–1.030)
UUROB: 0.2 mg/dL (ref 0.2–1.0)
pH, UA: 7.5 (ref 5.0–7.5)

## 2016-02-21 LAB — BAYER DCA HB A1C WAIVED: HB A1C (BAYER DCA - WAIVED): 7.1 % — ABNORMAL HIGH (ref <7.0)

## 2016-02-21 MED ORDER — FUROSEMIDE 40 MG PO TABS: 40.0000 mg | ORAL_TABLET | Freq: Every day | ORAL | 3 refills | Status: DC

## 2016-02-21 MED ORDER — POTASSIUM CHLORIDE CRYS ER 20 MEQ PO TBCR: 20.0000 meq | EXTENDED_RELEASE_TABLET | Freq: Every day | ORAL | 3 refills | Status: DC

## 2016-02-21 NOTE — Progress Notes (Signed)
Subjective:  Patient ID: Meredith Anderson, female    DOB: September 07, 1940  Age: 75 y.o. MRN: [id number]  CC: Hypertension (3 mth rck); Hyperlipidemia; and Diabetes   HPI HEAVAN DURNIL presents for  follow-up of hypertension. Patient has no history of headache chest pain or shortness of breath or recent cough. Patient also denies symptoms of TIA such as numbness weakness lateralizing. Patient checks  blood pressure at home and has not had any elevated readings recently. Patient denies side effects from his medication. States taking it regularly.  Patient also  in for follow-up of elevated cholesterol. Doing well without complaints on current medication. Denies side effects of statin including myalgia and arthralgia and nausea. Also in today for liver function testing. Currently no chest pain, shortness of breath or other cardiovascular related symptoms noted.  Follow-up of diabetes. Patient does check blood sugar at home. Readings run between 130 and 150. As low as 99. Patient denies symptoms such as polyuria, polydipsia, excessive hunger, nausea No significant hypoglycemic spells noted. One minor spell relieved by eating.  Medications as noted below. Taking them regularly without complication/adverse reaction being reported today.  Had a lot of leg swelling with increase of diltiazem so it was decreased. Helped some. Still swollen. No relief from curent diuretics   Patient in for follow-up of atrial fibrillation. Patient denies any recent bouts of chest pain or palpitations. Additionally, patient is taking anticoagulants. Patient denies any recent excessive bleeding episodes including epistaxis, bleeding from the gums, genitalia, rectal bleeding or hematuria. Additionally there has been no excessive bruising.   History Tarrin has a past medical history of A-fib ([place]); Allergy; Anxiety; Arthritis; CA - cancer of parotid gland; Cataract; Dyshydrosis; Hemorrhoids; Hyperlipidemia; Hypertension; Menopause;  NIDDM (non-insulin dependent diabetes mellitus); Osteopenia (2016); and Thyroid disease.   She has a past surgical history that includes Tubal ligation; bunionectomy bilateral; Abdominal hysterectomy; Eye surgery (Bilateral); Ablation; Rotator cuff repair (Right); Lumbar disc surgery; and Pacemaker insertion (12/14/2015).   Her family history includes Atrial fibrillation in her father; Cancer in her sister, sister, and sister; Diabetes in her brother, mother, sister, sister, and sister; Heart attack (age of onset: 40) in her brother; Heart disease in her brother and father; Hyperlipidemia in her brother, sister, and sister; Hypertension in her brother; Stroke in her mother.She reports that she has never smoked. She has never used smokeless tobacco. She reports that she does not drink alcohol or use drugs.  Current Outpatient Prescriptions on File Prior to Visit  Medication Sig Dispense Refill  . ACCU-CHEK AVIVA PLUS test strip USE TO CHECK BLOOD SUGAR ONCE A DAY OR AS INSTRUCTED 50 each 0  . Acetaminophen 500 MG coapsule     . bismuth subsalicylate (PEPTO BISMOL) 262 MG chewable tablet as needed. Reported on 07/13/2015    . cholecalciferol (VITAMIN D) 1000 UNITS tablet Take 1,000 Units by mouth daily.    Marland Kitchen conjugated estrogens (PREMARIN) vaginal cream Place 1 g vaginally 2 (two) times a week. Place vaginally twice a week.    . diclofenac sodium (VOLTAREN) 1 % GEL Apply 1 to 2 grams to area of pain up to qid as needed 100 g 2  . diltiazem (CARDIZEM CD) 360 MG 24 hr capsule TAKE 1 CAPSULE (360 MG TOTAL) BY MOUTH DAILY.  11  . diphenhydrAMINE (BENADRYL) 25 mg capsule 25 mg as needed.     . diphenoxylate-atropine (LOMOTIL) 2.5-0.025 MG tablet Take 1 tablet by mouth 4 (four) times daily as needed for diarrhea  or loose stools. 30 tablet 0  . escitalopram (LEXAPRO) 10 MG tablet TAKE 1 TABLET BY MOUTH ONCE A DAY (TO REPLACE CELEXA, GENERIC CITALOPRAM) (Patient taking differently: TAKE 1 TABLET BY MOUTH ONCE A  DAY (TO REPLACE CELEXA, GENERIC CITALOPRAM)  Takes 1/2 tablet at night) 90 tablet 3  . fexofenadine (ALLEGRA) 180 MG tablet Take 1 tablet (180 mg total) by mouth daily. For allergy symptoms 30 tablet 11  . hydrocortisone (PROCTOZONE-HC) 2.5 % rectal cream APPLY TO RECTAL AREA EVERY 6 HOURS AS NEEDED FOR HEMORRHOIDS 30 g 2  . levothyroxine (SYNTHROID, LEVOTHROID) 137 MCG tablet Take 1 tablet (137 mcg total) by mouth daily before breakfast. 90 tablet 3  . magnesium oxide (MAG-OX) 400 MG tablet 400 mg.    . pioglitazone (ACTOS) 30 MG tablet Take 1 tablet (30 mg total) by mouth daily. 90 tablet 3  . propafenone (RYTHMOL) 225 MG tablet TAKE 1 TABLET EVERY 8 HOURS 270 tablet 1  . Rivaroxaban (XARELTO) 15 MG TABS tablet Take 1 tablet (15 mg total) by mouth daily with supper. 90 tablet 1  . rosuvastatin (CRESTOR) 20 MG tablet Take 1 tablet (20 mg total) by mouth daily. 90 tablet 3  . TETRAHYDROZOLINE HCL OP Apply to eye. Reported on 01/02/2016     No current facility-administered medications on file prior to visit.     ROS Review of Systems  Constitutional: Negative for activity change, appetite change and fever.  HENT: Negative for congestion, rhinorrhea and sore throat.   Eyes: Negative for visual disturbance.  Respiratory: Negative for cough and shortness of breath.   Cardiovascular: Negative for chest pain and palpitations.  Gastrointestinal: Negative for abdominal pain, diarrhea and nausea.  Genitourinary: Negative for dysuria.  Musculoskeletal: Negative for arthralgias and myalgias.    Objective:  BP 121/71 (BP Location: Left Arm, Patient Position: Sitting, Cuff Size: Normal)   Temp 97.8 F (36.6 C) (Oral)   Ht 5\' 3"  (1.6 m)   Wt 173 lb 12.8 oz (78.8 kg)   SpO2 97%   BMI 30.79 kg/m   BP Readings from Last 3 Encounters:  02/21/16 121/71  01/25/16 131/78  01/09/16 121/76    Wt Readings from Last 3 Encounters:  02/21/16 173 lb 12.8 oz (78.8 kg)  01/25/16 174 lb (78.9 kg)    01/09/16 173 lb 9.6 oz (78.7 kg)     Physical Exam  Constitutional: She is oriented to person, place, and time. She appears well-developed and well-nourished. No distress.  HENT:  Head: Normocephalic and atraumatic.  Right Ear: External ear normal.  Left Ear: External ear normal.  Nose: Nose normal.  Mouth/Throat: Oropharynx is clear and moist.  Eyes: Conjunctivae and EOM are normal. Pupils are equal, round, and reactive to light.  Neck: Normal range of motion. Neck supple. No thyromegaly present.  Cardiovascular: Normal rate, regular rhythm and normal heart sounds.   No murmur heard. Pulmonary/Chest: Effort normal and breath sounds normal. No respiratory distress. She has no wheezes. She has no rales.  Abdominal: Soft. Bowel sounds are normal. She exhibits no distension. There is no tenderness.  Lymphadenopathy:    She has no cervical adenopathy.  Neurological: She is alert and oriented to person, place, and time. She has normal reflexes.  Skin: Skin is warm and dry.  Psychiatric: She has a normal mood and affect. Her behavior is normal. Judgment and thought content normal.    Lab Results  Component Value Date   HGBA1C 6.9 08/23/2015   HGBA1C 6.9 04/29/2015  HGBA1C 6.6 11/24/2014    Lab Results  Component Value Date   WBC 6.4 11/21/2015   HGB 12.0 (A) 11/24/2014   HCT 37.4 11/21/2015   PLT 220 11/21/2015   GLUCOSE 125 (H) 11/21/2015   CHOL 128 11/21/2015   TRIG 239 (H) 11/21/2015   HDL 33 (L) 11/21/2015   LDLCALC 47 11/21/2015   ALT 17 11/21/2015   AST 23 11/21/2015   NA 137 11/21/2015   K 4.2 11/21/2015   CL 96 11/21/2015   CREATININE 1.08 (H) 11/21/2015   BUN 19 11/21/2015   CO2 23 11/21/2015   TSH 2.800 11/21/2015   INR 1.8 07/13/2015   HGBA1C 6.9 08/23/2015    No results found.  Assessment & Plan:   Janete was seen today for hypertension, hyperlipidemia and diabetes.  Diagnoses and all orders for this visit:  Atrial fibrillation, unspecified type  ([place])  Type 2 diabetes mellitus with other specified complication, without long-term current use of insulin ([facility])  Elevated cholesterol with high triglycerides  Essential hypertension, benign  Long term (current) use of anticoagulants  Hypothyroidism, unspecified hypothyroidism type  Other orders -     furosemide (LASIX) 40 MG tablet; Take 1 tablet (40 mg total) by mouth daily. -     potassium chloride SA (K-DUR,KLOR-CON) 20 MEQ tablet; Take 1 tablet (20 mEq total) by mouth daily.   I have discontinued Ms. Nees's furosemide and hydrochlorothiazide. I am also having her start on furosemide and potassium chloride SA. Additionally, I am having her maintain her TETRAHYDROZOLINE HCL OP, conjugated estrogens, magnesium oxide, diphenhydrAMINE, bismuth subsalicylate, hydrocortisone, cholecalciferol, rosuvastatin, diclofenac sodium, escitalopram, levothyroxine, Acetaminophen, pioglitazone, Rivaroxaban, diphenoxylate-atropine, ACCU-CHEK AVIVA PLUS, fexofenadine, diltiazem, and propafenone.  Meds ordered this encounter  Medications  . furosemide (LASIX) 40 MG tablet    Sig: Take 1 tablet (40 mg total) by mouth daily.    Dispense:  30 tablet    Refill:  3  . potassium chloride SA (K-DUR,KLOR-CON) 20 MEQ tablet    Sig: Take 1 tablet (20 mEq total) by mouth daily.    Dispense:  30 tablet    Refill:  3     Follow-up: Return in about 3 months (around 05/23/2016), or if symptoms worsen or fail to improve.  Claretta Fraise, M.D.

## 2016-02-21 NOTE — Patient Instructions (Signed)
Call your Eye doctor today to arrange a diabetic eye exam

## 2016-02-22 ENCOUNTER — Encounter: Payer: Medicare Other | Admitting: *Deleted

## 2016-02-22 DIAGNOSIS — Z1231 Encounter for screening mammogram for malignant neoplasm of breast: Secondary | ICD-10-CM | POA: Diagnosis not present

## 2016-02-22 LAB — CMP14+EGFR
ALBUMIN: 4.7 g/dL (ref 3.5–4.8)
ALK PHOS: 102 IU/L (ref 39–117)
ALT: 22 IU/L (ref 0–32)
AST: 25 IU/L (ref 0–40)
Albumin/Globulin Ratio: 1.6 (ref 1.2–2.2)
BILIRUBIN TOTAL: 0.4 mg/dL (ref 0.0–1.2)
BUN / CREAT RATIO: 21 (ref 12–28)
BUN: 20 mg/dL (ref 8–27)
CHLORIDE: 94 mmol/L — AB (ref 96–106)
CO2: 22 mmol/L (ref 18–29)
Calcium: 9.8 mg/dL (ref 8.7–10.3)
Creatinine, Ser: 0.97 mg/dL (ref 0.57–1.00)
GFR calc Af Amer: 67 mL/min/{1.73_m2} (ref >59)
GFR calc non Af Amer: 58 mL/min/{1.73_m2} — ABNORMAL LOW (ref >59)
GLUCOSE: 134 mg/dL — AB (ref 65–99)
Globulin, Total: 2.9 g/dL (ref 1.5–4.5)
Potassium: 3.8 mmol/L (ref 3.5–5.2)
Sodium: 137 mmol/L (ref 134–144)
Total Protein: 7.6 g/dL (ref 6.0–8.5)

## 2016-02-22 LAB — MICROALBUMIN / CREATININE URINE RATIO
Creatinine, Urine: 30.4 mg/dL
MICROALB/CREAT RATIO: 12.5 mg/g{creat} (ref 0.0–30.0)
Microalbumin, Urine: 3.8 ug/mL

## 2016-02-22 LAB — LIPID PANEL
CHOL/HDL RATIO: 2.7 ratio (ref 0.0–4.4)
Cholesterol, Total: 115 mg/dL (ref 100–199)
HDL: 42 mg/dL (ref >39)
LDL CALC: 46 mg/dL (ref 0–99)
Triglycerides: 137 mg/dL (ref 0–149)
VLDL CHOLESTEROL CAL: 27 mg/dL (ref 5–40)

## 2016-02-22 LAB — TSH+FREE T4
Free T4: 1.38 ng/dL (ref 0.82–1.77)
TSH: 4.19 u[IU]/mL (ref 0.450–4.500)

## 2016-02-22 LAB — HM MAMMOGRAPHY

## 2016-03-02 ENCOUNTER — Ambulatory Visit (INDEPENDENT_AMBULATORY_CARE_PROVIDER_SITE_OTHER): Payer: Medicare Other | Admitting: Family Medicine

## 2016-03-02 ENCOUNTER — Encounter: Payer: Self-pay | Admitting: Family Medicine

## 2016-03-02 VITALS — BP 124/68 | HR 86 | Temp 97.6°F | Ht 63.0 in | Wt 173.0 lb

## 2016-03-02 DIAGNOSIS — L989 Disorder of the skin and subcutaneous tissue, unspecified: Secondary | ICD-10-CM | POA: Diagnosis not present

## 2016-03-02 DIAGNOSIS — C44622 Squamous cell carcinoma of skin of right upper limb, including shoulder: Secondary | ICD-10-CM | POA: Diagnosis not present

## 2016-03-02 DIAGNOSIS — L820 Inflamed seborrheic keratosis: Secondary | ICD-10-CM

## 2016-03-02 DIAGNOSIS — D485 Neoplasm of uncertain behavior of skin: Secondary | ICD-10-CM

## 2016-03-02 DIAGNOSIS — L570 Actinic keratosis: Secondary | ICD-10-CM | POA: Diagnosis not present

## 2016-03-02 NOTE — Patient Instructions (Signed)
Follow up  / call if you see any signs of infection  1- redness 2- pus 3-swelling

## 2016-03-03 ENCOUNTER — Other Ambulatory Visit: Payer: Self-pay | Admitting: Family Medicine

## 2016-03-03 NOTE — Progress Notes (Signed)
CC: three lesions to be removed.  HPI: Pt has three lesions growing on legs that she wants removed. Each is growing and tow on right have suspicious appearance. There is  itching noted as well as burning.No Redness swelling or drainage. Pt. Denies fever.  O: The first  Lesion is lateral, proximal on right calf. It is 1 cm in diameter, raised, perlescent with central nib. It has a regular, circumscribrd border.  #2 right lateral distal calf. Measures 27mm. Raised, rough & round. #3 left mid calf rough raised, warty  A: #1&2 have appearancesuspicious for possible basal cell carcinoma.       #3 irritated seb. Keratosis Procedures:  Lesion 1 & 2 prepped and draped in sterile fashion. Local with 1% lido with epi intradermal. Lesions then shaved with dermablade. Specimens preserved for path. Site cauteized with hyfrecator set at 10. Blood loss < 1cc. Tolerated well. Dressings applied.  Wound care, s&s of infectino reviewed. Lesion 3 treated with liquid nitrogen  Cryo for 30 seconds. Wound care,s&s infectrion reviewed. Folow up prn.  1. Skin lesion of right leg    Follow up as needed for healing, infection Claretta Fraise, MD

## 2016-03-08 ENCOUNTER — Encounter: Payer: Self-pay | Admitting: Family

## 2016-03-08 ENCOUNTER — Ambulatory Visit (INDEPENDENT_AMBULATORY_CARE_PROVIDER_SITE_OTHER): Payer: Medicare Other | Admitting: Family

## 2016-03-08 VITALS — BP 109/65 | HR 83 | Temp 97.7°F | Ht 63.0 in | Wt 171.8 lb

## 2016-03-08 DIAGNOSIS — J069 Acute upper respiratory infection, unspecified: Secondary | ICD-10-CM

## 2016-03-08 LAB — PATHOLOGY

## 2016-03-08 MED ORDER — AMOXICILLIN-POT CLAVULANATE 875-125 MG PO TABS: 1.0000 | ORAL_TABLET | Freq: Two times a day (BID) | ORAL | 0 refills | Status: DC

## 2016-03-08 NOTE — Progress Notes (Signed)
Subjective:    Patient ID: Meredith Anderson, female    DOB: 05-17-1941, 75 y.o.   MRN: [id number]  Otalgia   There is pain in both ears. This is a new problem. The current episode started in the past 7 days. The problem occurs constantly. The problem has been waxing and waning. Associated symptoms include coughing, headaches, rhinorrhea and a sore throat.  Cough  This is a new problem. The current episode started in the past 7 days (5 days ago). The problem has been gradually worsening. The problem occurs every few minutes. The cough is productive of purulent sputum. Associated symptoms include ear congestion, ear pain, headaches, nasal congestion, postnasal drip, rhinorrhea and a sore throat. Pertinent negatives include no chills, fever, myalgias or shortness of breath. The symptoms are aggravated by lying down and pollens. She has tried rest and OTC cough suppressant for the symptoms. The treatment provided mild relief. There is no history of asthma.      Review of Systems  Constitutional: Negative for chills and fever.  HENT: Positive for ear pain, postnasal drip, rhinorrhea and sore throat.   Respiratory: Positive for cough. Negative for shortness of breath.   Musculoskeletal: Negative for myalgias.  Neurological: Positive for headaches.  All other systems reviewed and are negative.      Objective:   Physical Exam  Constitutional: She is oriented to person, place, and time. She appears well-developed and well-nourished. No distress.  HENT:  Head: Normocephalic and atraumatic.  Right Ear: External ear normal.  Left Ear: External ear normal.  Mouth/Throat: Oropharynx is clear and moist.  Nasal passage erythemas with mild swelling   Eyes: Pupils are equal, round, and reactive to light.  Neck: Normal range of motion. Neck supple. No thyromegaly present.  Cardiovascular: Normal rate, regular rhythm, normal heart sounds and intact distal pulses.   No murmur heard. Pulmonary/Chest:  Effort normal and breath sounds normal. No respiratory distress. She has no wheezes.  Abdominal: Soft. Bowel sounds are normal. She exhibits no distension. There is no tenderness.  Musculoskeletal: Normal range of motion. She exhibits no edema or tenderness.  Neurological: She is alert and oriented to person, place, and time.  Skin: Skin is warm and dry.  Psychiatric: She has a normal mood and affect. Her behavior is normal. Judgment and thought content normal.  Vitals reviewed.     BP 109/65   Pulse 83   Temp 97.7 F (36.5 C) (Oral)   Ht 5\' 3"  (1.6 m)   Wt 171 lb 12.8 oz (77.9 kg)   BMI 30.43 kg/m      Assessment & Plan:  1. Acute upper respiratory infection -- Take meds as prescribed - Use a cool mist humidifier  -Use saline nose sprays frequently -Saline irrigations of the nose can be very helpful if done frequently.  * 4X daily for 1 week*  * Use of a nettie pot can be helpful with this. Follow directions with this* -Force fluids -For any cough or congestion  Use plain Mucinex- regular strength or max strength is fine   * Children- consult with Pharmacist for dosing -For fever or aces or pains- take tylenol or ibuprofen appropriate for age and weight.  * for fevers greater than 101 orally you may alternate ibuprofen and tylenol every  3 hours. -Throat lozenges if help -Pt told not to pick up antibiotic until 09/069/17 and only start if her symptoms have not improved or become worse - amoxicillin-clavulanate (AUGMENTIN) 875-125 MG tablet;  Take 1 tablet by mouth 2 (two) times daily.  Dispense: 10 tablet; Refill: 0  Evelina Dun, FNP

## 2016-03-08 NOTE — Patient Instructions (Signed)
Upper Respiratory Infection, Adult Most upper respiratory infections (URIs) are a viral infection of the air passages leading to the lungs. A URI affects the nose, throat, and upper air passages. The most common type of URI is nasopharyngitis and is typically referred to as "the common cold." URIs run their course and usually go away on their own. Most of the time, a URI does not require medical attention, but sometimes a bacterial infection in the upper airways can follow a viral infection. This is called a secondary infection. Sinus and middle ear infections are common types of secondary upper respiratory infections. Bacterial pneumonia can also complicate a URI. A URI can worsen asthma and chronic obstructive pulmonary disease (COPD). Sometimes, these complications can require emergency medical care and may be life threatening.  CAUSES Almost all URIs are caused by viruses. A virus is a type of germ and can spread from one person to another.  RISKS FACTORS You may be at risk for a URI if:   You smoke.   You have chronic heart or lung disease.  You have a weakened defense (immune) system.   You are very young or very old.   You have nasal allergies or asthma.  You work in crowded or poorly ventilated areas.  You work in health care facilities or schools. SIGNS AND SYMPTOMS  Symptoms typically develop 2-3 days after you come in contact with a cold virus. Most viral URIs last 7-10 days. However, viral URIs from the influenza virus (flu virus) can last 14-18 days and are typically more severe. Symptoms may include:   Runny or stuffy (congested) nose.   Sneezing.   Cough.   Sore throat.   Headache.   Fatigue.   Fever.   Loss of appetite.   Pain in your forehead, behind your eyes, and over your cheekbones (sinus pain).  Muscle aches.  DIAGNOSIS  Your health care provider may diagnose a URI by:  Physical exam.  Tests to check that your symptoms are not due to  another condition such as:  Strep throat.  Sinusitis.  Pneumonia.  Asthma. TREATMENT  A URI goes away on its own with time. It cannot be cured with medicines, but medicines may be prescribed or recommended to relieve symptoms. Medicines may help:  Reduce your fever.  Reduce your cough.  Relieve nasal congestion. HOME CARE INSTRUCTIONS   Take medicines only as directed by your health care provider.   Gargle warm saltwater or take cough drops to comfort your throat as directed by your health care provider.  Use a warm mist humidifier or inhale steam from a shower to increase air moisture. This may make it easier to breathe.  Drink enough fluid to keep your urine clear or pale yellow.   Eat soups and other clear broths and maintain good nutrition.   Rest as needed.   Return to work when your temperature has returned to normal or as your health care provider advises. You may need to stay home longer to avoid infecting others. You can also use a face mask and careful hand washing to prevent spread of the virus.  Increase the usage of your inhaler if you have asthma.   Do not use any tobacco products, including cigarettes, chewing tobacco, or electronic cigarettes. If you need help quitting, ask your health care provider. PREVENTION  The best way to protect yourself from getting a cold is to practice good hygiene.   Avoid oral or hand contact with people with cold   symptoms.   Wash your hands often if contact occurs.  There is no clear evidence that vitamin C, vitamin E, echinacea, or exercise reduces the chance of developing a cold. However, it is always recommended to get plenty of rest, exercise, and practice good nutrition.  SEEK MEDICAL CARE IF:   You are getting worse rather than better.   Your symptoms are not controlled by medicine.   You have chills.  You have worsening shortness of breath.  You have brown or red mucus.  You have yellow or brown nasal  discharge.  You have pain in your face, especially when you bend forward.  You have a fever.  You have swollen neck glands.  You have pain while swallowing.  You have white areas in the back of your throat. SEEK IMMEDIATE MEDICAL CARE IF:   You have severe or persistent:  Headache.  Ear pain.  Sinus pain.  Chest pain.  You have chronic lung disease and any of the following:  Wheezing.  Prolonged cough.  Coughing up blood.  A change in your usual mucus.  You have a stiff neck.  You have changes in your:  Vision.  Hearing.  Thinking.  Mood. MAKE SURE YOU:   Understand these instructions.  Will watch your condition.  Will get help right away if you are not doing well or get worse.   This information is not intended to replace advice given to you by your health care provider. Make sure you discuss any questions you have with your health care provider.   Document Released: 12/12/2000 Document Revised: 11/02/2014 Document Reviewed: 09/23/2013 Elsevier Interactive Patient Education 2016 [employer]  - Take meds as prescribed - Use a cool mist humidifier  -Use saline nose sprays frequently -Saline irrigations of the nose can be very helpful if done frequently.  * 4X daily for 1 week*  * Use of a nettie pot can be helpful with this. Follow directions with this* -Force fluids -For any cough or congestion  Use plain Mucinex- regular strength or max strength is fine   * Children- consult with Pharmacist for dosing -For fever or aces or pains- take tylenol or ibuprofen appropriate for age and weight.  * for fevers greater than 101 orally you may alternate ibuprofen and tylenol every  3 hours. -Throat lozenges if help -New toothbrush in 3 days   Salayah Meares, FNP  

## 2016-03-09 ENCOUNTER — Other Ambulatory Visit: Payer: Self-pay | Admitting: Family Medicine

## 2016-03-09 DIAGNOSIS — C44722 Squamous cell carcinoma of skin of right lower limb, including hip: Secondary | ICD-10-CM

## 2016-03-10 ENCOUNTER — Other Ambulatory Visit: Payer: Self-pay | Admitting: Family Medicine

## 2016-03-20 ENCOUNTER — Telehealth: Payer: Self-pay | Admitting: Family Medicine

## 2016-03-20 NOTE — Telephone Encounter (Signed)
[facility] just wanted to let you know the patient has been scheduled for surgery on October 4th at 10:30 with Dr. Sarajane Jews.   Patient is aware as well.

## 2016-04-03 ENCOUNTER — Ambulatory Visit (INDEPENDENT_AMBULATORY_CARE_PROVIDER_SITE_OTHER): Payer: Medicare Other

## 2016-04-03 DIAGNOSIS — Z23 Encounter for immunization: Secondary | ICD-10-CM | POA: Diagnosis not present

## 2016-04-04 DIAGNOSIS — C44722 Squamous cell carcinoma of skin of right lower limb, including hip: Secondary | ICD-10-CM | POA: Diagnosis not present

## 2016-05-02 DIAGNOSIS — Z45018 Encounter for adjustment and management of other part of cardiac pacemaker: Secondary | ICD-10-CM | POA: Diagnosis not present

## 2016-05-02 DIAGNOSIS — Z950 Presence of cardiac pacemaker: Secondary | ICD-10-CM | POA: Diagnosis not present

## 2016-05-02 DIAGNOSIS — I495 Sick sinus syndrome: Secondary | ICD-10-CM | POA: Diagnosis not present

## 2016-05-09 DIAGNOSIS — I110 Hypertensive heart disease with heart failure: Secondary | ICD-10-CM | POA: Diagnosis not present

## 2016-05-09 DIAGNOSIS — R079 Chest pain, unspecified: Secondary | ICD-10-CM | POA: Diagnosis not present

## 2016-05-09 DIAGNOSIS — E119 Type 2 diabetes mellitus without complications: Secondary | ICD-10-CM | POA: Diagnosis not present

## 2016-05-09 DIAGNOSIS — Z950 Presence of cardiac pacemaker: Secondary | ICD-10-CM | POA: Diagnosis not present

## 2016-05-09 DIAGNOSIS — I480 Paroxysmal atrial fibrillation: Secondary | ICD-10-CM | POA: Diagnosis not present

## 2016-05-09 DIAGNOSIS — Z9889 Other specified postprocedural states: Secondary | ICD-10-CM | POA: Diagnosis not present

## 2016-05-09 DIAGNOSIS — I495 Sick sinus syndrome: Secondary | ICD-10-CM | POA: Diagnosis not present

## 2016-05-09 DIAGNOSIS — E785 Hyperlipidemia, unspecified: Secondary | ICD-10-CM | POA: Diagnosis not present

## 2016-05-09 DIAGNOSIS — R55 Syncope and collapse: Secondary | ICD-10-CM | POA: Diagnosis not present

## 2016-05-09 DIAGNOSIS — E039 Hypothyroidism, unspecified: Secondary | ICD-10-CM | POA: Diagnosis not present

## 2016-05-09 DIAGNOSIS — Z7901 Long term (current) use of anticoagulants: Secondary | ICD-10-CM | POA: Diagnosis not present

## 2016-05-09 DIAGNOSIS — Z95810 Presence of automatic (implantable) cardiac defibrillator: Secondary | ICD-10-CM | POA: Diagnosis not present

## 2016-05-09 DIAGNOSIS — I5030 Unspecified diastolic (congestive) heart failure: Secondary | ICD-10-CM | POA: Diagnosis not present

## 2016-05-09 DIAGNOSIS — Z79899 Other long term (current) drug therapy: Secondary | ICD-10-CM | POA: Diagnosis not present

## 2016-05-16 DIAGNOSIS — H524 Presbyopia: Secondary | ICD-10-CM | POA: Diagnosis not present

## 2016-05-16 DIAGNOSIS — H52221 Regular astigmatism, right eye: Secondary | ICD-10-CM | POA: Diagnosis not present

## 2016-05-16 DIAGNOSIS — H1849 Other corneal degeneration: Secondary | ICD-10-CM | POA: Diagnosis not present

## 2016-05-16 DIAGNOSIS — H5203 Hypermetropia, bilateral: Secondary | ICD-10-CM | POA: Diagnosis not present

## 2016-05-16 LAB — HM DIABETES EYE EXAM

## 2016-05-23 ENCOUNTER — Encounter: Payer: Self-pay | Admitting: Pharmacist

## 2016-05-23 ENCOUNTER — Ambulatory Visit (INDEPENDENT_AMBULATORY_CARE_PROVIDER_SITE_OTHER): Payer: Medicare Other | Admitting: Pharmacist

## 2016-05-23 VITALS — BP 136/80 | HR 68 | Ht 64.0 in | Wt 173.5 lb

## 2016-05-23 DIAGNOSIS — M8580 Other specified disorders of bone density and structure, unspecified site: Secondary | ICD-10-CM

## 2016-05-23 DIAGNOSIS — Z0000 Encounter for general adult medical examination without abnormal findings: Secondary | ICD-10-CM | POA: Diagnosis not present

## 2016-05-23 DIAGNOSIS — R29898 Other symptoms and signs involving the musculoskeletal system: Secondary | ICD-10-CM

## 2016-05-23 NOTE — Patient Instructions (Addendum)
Meredith Anderson , Thank you for taking time to come for your Medicare Wellness Visit. I appreciate your ongoing commitment to your health goals. Please review the following plan we discussed and let me know if I can assist you in the future.   These are the goals we discussed: I am sending referral for balance assessment to physical therapy.   Remember to use diphenhydramine / Benadryl only as needed - it can increase falls and affect concentration / memory.    This is a list of the screening recommended for you and due dates:  Health Maintenance  Topic Date Due  . DEXA scan (bone density measurement)  08/11/2016  . Hemoglobin A1C  08/23/2016  . Complete foot exam   01/24/2017  . Urine Protein Check  02/20/2017  . Eye exam for diabetics  05/16/2017  . Mammogram  02/21/2018  . Colon Cancer Screening  11/01/2019  . Tetanus Vaccine  01/30/2022  . Flu Shot  Completed  . Shingles Vaccine  Completed  . Pneumonia vaccines  Completed   Fall Prevention in the Home Falls can cause injuries and can affect people from all age groups. There are many simple things that you can do to make your home safe and to help prevent falls. What can I do on the outside of my home?  Regularly repair the edges of walkways and driveways and fix any cracks.  Remove high doorway thresholds.  Trim any shrubbery on the main path into your home.  Use bright outdoor lighting.  Clear walkways of debris and clutter, including tools and rocks.  Regularly check that handrails are securely fastened and in good repair. Both sides of any steps should have handrails.  Install guardrails along the edges of any raised decks or porches.  Have leaves, snow, and ice cleared regularly.  Use sand or salt on walkways during winter months.  In the garage, clean up any spills right away, including grease or oil spills. What can I do in the bathroom?  Use night lights.  Install grab bars by the toilet and in the tub and  shower. Do not use towel bars as grab bars.  Use non-skid mats or decals on the floor of the tub or shower.  If you need to sit down while you are in the shower, use a plastic, non-slip stool.  Keep the floor dry. Immediately clean up any water that spills on the floor.  Remove soap buildup in the tub or shower on a regular basis.  Attach bath mats securely with double-sided non-slip rug tape.  Remove throw rugs and other tripping hazards from the floor. What can I do in the bedroom?  Use night lights.  Make sure that a bedside light is easy to reach.  Do not use oversized bedding that drapes onto the floor.  Have a firm chair that has side arms to use for getting dressed.  Remove throw rugs and other tripping hazards from the floor. What can I do in the kitchen?  Clean up any spills right away.  Avoid walking on wet floors.  Place frequently used items in easy-to-reach places.  If you need to reach for something above you, use a sturdy step stool that has a grab bar.  Keep electrical cables out of the way.  Do not use floor polish or wax that makes floors slippery. If you have to use wax, make sure that it is non-skid floor wax.  Remove throw rugs and other tripping hazards from  the floor. What can I do in the stairways?  Do not leave any items on the stairs.  Make sure that there are handrails on both sides of the stairs. Fix handrails that are broken or loose. Make sure that handrails are as long as the stairways.  Check any carpeting to make sure that it is firmly attached to the stairs. Fix any carpet that is loose or worn.  Avoid having throw rugs at the top or bottom of stairways, or secure the rugs with carpet tape to prevent them from moving.  Make sure that you have a light switch at the top of the stairs and the bottom of the stairs. If you do not have them, have them installed. What are some other fall prevention tips?  Wear closed-toe shoes that fit  well and support your feet. Wear shoes that have rubber soles or low heels.  When you use a stepladder, make sure that it is completely opened and that the sides are firmly locked. Have someone hold the ladder while you are using it. Do not climb a closed stepladder.  Add color or contrast paint or tape to grab bars and handrails in your home. Place contrasting color strips on the first and last steps.  Use mobility aids as needed, such as canes, walkers, scooters, and crutches.  Turn on lights if it is dark. Replace any light bulbs that burn out.  Set up furniture so that there are clear paths. Keep the furniture in the same spot.  Fix any uneven floor surfaces.  Choose a carpet design that does not hide the edge of steps of a stairway.  Be aware of any and all pets.  Review your medicines with your healthcare provider. Some medicines can cause dizziness or changes in blood pressure, which increase your risk of falling. Talk with your health care provider about other ways that you can decrease your risk of falls. This may include working with a physical therapist or trainer to improve your strength, balance, and endurance. This information is not intended to replace advice given to you by your health care provider. Make sure you discuss any questions you have with your health care provider. Document Released: 06/08/2002 Document Revised: 11/15/2015 Document Reviewed: 07/23/2014 Elsevier Interactive Patient Education  2017 [employer].

## 2016-05-23 NOTE — Progress Notes (Addendum)
Patient ID: Meredith Anderson, female   DOB: December 22, 1940, 75 y.o.   MRN: [id number]    Subjective:   Meredith Anderson is a 75 y.o. female who presents for a subsequent Medicare Annual Wellness Visit.  Meredith Anderson reports that she is doing well.  Her only complaint is that she is not able to climb stairs well or walk very far.  She is concerned that she might fall.   SH:  Divorced female.  She lives in [place], [place] alone.  She has 2 daughters.   Meredith Anderson is retired - worked at [employer] and [employer] in past.   Current Medications (verified) Outpatient Encounter Prescriptions as of 05/23/2016  Medication Sig  . ACCU-CHEK AVIVA PLUS test strip USE TO CHECK BLOOD SUGAR ONCE A DAY OR AS INSTRUCTED  . Acetaminophen 500 MG coapsule   . bismuth subsalicylate (PEPTO BISMOL) 262 MG chewable tablet as needed. Reported on 07/13/2015  . cholecalciferol (VITAMIN D) 1000 UNITS tablet Take 1,000 Units by mouth daily.  Marland Kitchen conjugated estrogens (PREMARIN) vaginal cream Place 1 g vaginally 2 (two) times a week. Place vaginally twice a week.  . diclofenac sodium (VOLTAREN) 1 % GEL Apply 1 to 2 grams to area of pain up to qid as needed  . diltiazem (CARDIZEM CD) 180 MG 24 hr capsule Take 180 mg by mouth.  . diphenhydrAMINE (BENADRYL) 25 mg capsule 25 mg as needed.   . fexofenadine (ALLEGRA) 180 MG tablet Take 1 tablet (180 mg total) by mouth daily. For allergy symptoms  . furosemide (LASIX) 40 MG tablet Take 1 tablet (40 mg total) by mouth daily.  . hydrocortisone (PROCTOZONE-HC) 2.5 % rectal cream APPLY TO RECTAL AREA EVERY 6 HOURS AS NEEDED FOR HEMORRHOIDS  . levothyroxine (SYNTHROID, LEVOTHROID) 137 MCG tablet Take 1 tablet (137 mcg total) by mouth daily before breakfast.  . magnesium oxide (MAG-OX) 400 MG tablet 400 mg.  . pioglitazone (ACTOS) 30 MG tablet Take 1 tablet (30 mg total) by mouth daily.  . potassium chloride SA (K-DUR,KLOR-CON) 20 MEQ tablet Take 1 tablet (20 mEq total) by mouth daily.  .  propafenone (RYTHMOL) 225 MG tablet TAKE 1 TABLET EVERY 8 HOURS  . rosuvastatin (CRESTOR) 20 MG tablet Take 1 tablet (20 mg total) by mouth daily.  . TETRAHYDROZOLINE HCL OP Apply to eye. Reported on 01/02/2016  . XARELTO 15 MG TABS tablet TAKE 1 TABLET (15 MG TOTAL) BY MOUTH DAILY WITH SUPPER.  . [DISCONTINUED] diphenoxylate-atropine (LOMOTIL) 2.5-0.025 MG tablet Take 1 tablet by mouth 4 (four) times daily as needed for diarrhea or loose stools.  . [DISCONTINUED] amoxicillin-clavulanate (AUGMENTIN) 875-125 MG tablet Take 1 tablet by mouth 2 (two) times daily. (Patient not taking: Reported on 05/23/2016)  . [DISCONTINUED] diltiazem (CARDIZEM CD) 360 MG 24 hr capsule TAKE 1 CAPSULE (360 MG TOTAL) BY MOUTH DAILY.   No facility-administered encounter medications on file as of 05/23/2016.     Allergies (verified) Amlodipine; Dronedarone; Glipizide-metformin hcl; Metformin; and Niaspan [niacin]   History: Past Medical History:  Diagnosis Date  . A-fib (Anderson)   . Allergy    seasonal  . Anxiety   . Arthritis   . 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 Cataract   . Dyshydrosis   . Hemorrhoids   . Hyperlipidemia   . Hypertension   . Menopause   . NIDDM (non-insulin dependent diabetes mellitus)    diet controlled   . Osteopenia 2016  .  Thyroid disease    Past Surgical History:  Procedure Laterality Date  . ABDOMINAL HYSTERECTOMY     partial  . ABLATION    . bunionectomy bilateral    . EYE SURGERY Bilateral    cataracts  . LUMBAR DISC SURGERY     L4 and L5  . PACEMAKER INSERTION  12/14/2015  . ROTATOR CUFF REPAIR Right   . TUBAL LIGATION     Family History  Problem Relation Age of Onset  . Stroke Mother   . Diabetes Mother   . Heart disease Father   . Atrial fibrillation Father   . Cancer Sister     ovarian / colon  . Diabetes Sister   . Heart disease Brother   . Hyperlipidemia Brother   . Hypertension Brother   . Diabetes Brother   . Heart attack Brother 12     had to perform emergency surgery  . Dementia Brother   . Hyperlipidemia Sister   . Cancer Sister     liver  . Hyperlipidemia Sister   . Diabetes Sister     [place] DM  . Cancer Sister   . Diabetes Sister   . Diabetes Daughter   . Rheum arthritis Daughter   . Psoriasis Daughter    Social History   Occupational History  . Not on file.   Social History Main Topics  . Smoking status: Never Smoker  . Smokeless tobacco: Never Used  . Alcohol use No  . Drug use: No  . Sexual activity: No    Do you feel safe at home?  Yes  Does patient have fear of falling?  Yes Are there smokers in your home (other than you)? No    Dietary issues and exercise activities: Current Exercise Habits: The patient does not participate in regular exercise at present, Exercise limited by: orthopedic condition(s)  Current Dietary habits:  Patient follows a CHO counting diet, eats lots of vegetables and tries to choose lean proteins.  Objective:    Today's Vitals   05/23/16 1528  BP: 136/80  Pulse: 68  Weight: 173 lb 8 oz (78.7 kg)  Height: 5\' 4"  (1.626 m)  PainSc: 2   PainLoc: Leg   Body mass index is 29.78 kg/m.  Activities of Daily Living In your present state of health, do you have any difficulty performing the following activities: 05/23/2016 05/23/2016  Hearing? - Y  Vision? - N  Difficulty concentrating or making decisions? - N  Walking or climbing stairs? N N  Dressing or bathing? - N  Doing errands, shopping? - N  Conservation officer, nature and eating ? - N  Using the Toilet? - N  In the past six months, have you accidently leaked urine? - N  Do you have problems with loss of bowel control? - N  Managing your Medications? - N  Managing your Finances? - N  Housekeeping or managing your Housekeeping? - N  Some recent data might be hidden     Cardiac Risk Factors include: advanced age (>71men, >50 women);diabetes mellitus;dyslipidemia;hypertension;sedentary lifestyle  Depression  Screen PHQ 2/9 Scores 05/23/2016 03/08/2016 03/02/2016 02/21/2016  PHQ - 2 Score 0 0 0 0     Fall Risk Fall Risk  05/23/2016 03/08/2016 03/02/2016 02/21/2016 01/25/2016  Falls in the past year? No Yes Yes Yes Yes  Number falls in past yr: - 1 1 1 1   Injury with Fall? - No No No No  Risk for fall due to : - - - - -  Cognitive Function: MMSE - Mini Mental State Exam 05/23/2016 05/20/2015  Orientation to time 5 5  Orientation to Place 5 5  Registration 3 3  Attention/ Calculation 3 5  Recall 3 3  Language- name 2 objects 2 2  Language- repeat 1 1  Language- follow 3 step command 3 3  Language- read & follow direction 1 1  Write a sentence 1 1  Copy design 1 1  Total score 28 30    Immunizations and Health Maintenance Immunization History  Administered Date(s) Administered  . Influenza Whole 04/01/2012  . Influenza,inj,Quad PF,36+ Mos 04/03/2013, 04/03/2016  . Influenza-Unspecified 03/15/2014  . Pneumococcal Conjugate-13 05/17/2014  . Pneumococcal Polysaccharide-23 08/02/2009  . Tdap 01/31/2012  . Zoster 01/31/2012   There are no preventive care reminders to display for this patient.  Patient Care Team: Claretta Fraise, MD as PCP - General (Family Medicine) Frederik Pear, MD as Consulting Physician (Internal Medicine) Wyvonnia Dusky, MD as Consulting Physician (Cardiology) Griselda Miner, MD as Consulting Physician (Dermatology)  Indicate any recent Medical Services you may have received from other than [facility] providers in the past year (date may be approximate).    Assessment:    Annual Wellness Visit    Screening Tests Health Maintenance  Topic Date Due  . DEXA SCAN  08/11/2016  . HEMOGLOBIN A1C  08/23/2016  . FOOT EXAM  01/24/2017  . URINE MICROALBUMIN  02/20/2017  . OPHTHALMOLOGY EXAM  05/16/2017  . MAMMOGRAM  02/21/2018  . COLONOSCOPY  11/01/2019  . TETANUS/TDAP  01/30/2022  . INFLUENZA VACCINE  Completed  . ZOSTAVAX  Completed  . PNA vac Low Risk  Adult  Completed        Plan:   During the course of the visit Meredith Anderson was educated and counseled about the following appropriate screening and preventive services:   Vaccines to include Pneumoccal, Influenza, Td, Zostavax - all vaccines are UTD  Colorectal cancer screening - UTD  Cardiovascular disease screening -UTD, patient [facility]ology [facility] on regular basis  Diabetes screening  Bone Denisty / Osteoporosis Screening - UTD  Mammogram - UTD  Glaucoma screening / Diabetic Eye Exam - UTD   Nutrition counseling - continue to follow diabetic / CHO counting diet  Advanced Directives - pt declined  Fall prevention discussed.    Discussed medications that can increase falls - patient is to use diphenhydramine only as needed  Physical Activity - referral sent to PT for balance assessment and treat as needed.   Cherre Robins, PharmD, CPP, CDE   I have reviewed and agree with the above AWV documentation.  Claretta Fraise, M.D.

## 2016-05-24 ENCOUNTER — Encounter: Payer: Self-pay | Admitting: Pharmacist

## 2016-05-25 ENCOUNTER — Encounter: Payer: Self-pay | Admitting: Family Medicine

## 2016-05-25 ENCOUNTER — Ambulatory Visit (INDEPENDENT_AMBULATORY_CARE_PROVIDER_SITE_OTHER): Payer: Medicare Other | Admitting: Family Medicine

## 2016-05-25 VITALS — BP 136/77 | HR 79 | Temp 97.0°F | Ht 64.0 in | Wt 173.0 lb

## 2016-05-25 DIAGNOSIS — E119 Type 2 diabetes mellitus without complications: Secondary | ICD-10-CM | POA: Diagnosis not present

## 2016-05-25 DIAGNOSIS — E039 Hypothyroidism, unspecified: Secondary | ICD-10-CM

## 2016-05-25 DIAGNOSIS — I482 Chronic atrial fibrillation, unspecified: Secondary | ICD-10-CM

## 2016-05-25 DIAGNOSIS — Z7901 Long term (current) use of anticoagulants: Secondary | ICD-10-CM

## 2016-05-25 DIAGNOSIS — I10 Essential (primary) hypertension: Secondary | ICD-10-CM | POA: Diagnosis not present

## 2016-05-25 DIAGNOSIS — E782 Mixed hyperlipidemia: Secondary | ICD-10-CM | POA: Diagnosis not present

## 2016-05-25 LAB — BAYER DCA HB A1C WAIVED: HB A1C: 6.9 % (ref <7.0)

## 2016-05-25 NOTE — Progress Notes (Signed)
Subjective:  Patient ID: Meredith Anderson, female    DOB: 1941/04/25  Age: 75 y.o. MRN: [id number]  CC: Hypertension (pt here for routine follow up and no problems voiced)   HPI Meredith Anderson presents for  follow-up of hypertension. Patient has no history of headache chest pain or shortness of breath or recent cough. Patient also denies symptoms of TIA such as numbness weakness lateralizing. Patient checks  blood pressure at home and has not had any elevated readings recently. Patient denies side effects from his medication. States taking it regularly.  Patient also  in for follow-up of elevated cholesterol. Doing well without complaints on current medication. Denies side effects of statin including myalgia and arthralgia and nausea. Also in today for liver function testing. Currently no chest pain, shortness of breath or other cardiovascular related symptoms noted.  Follow-up of diabetes. Patient does check blood sugar at home. Readings run between 140 and 150. Sometimes lower - to 100 later in the day. Patient denies symptoms such as polyuria, polydipsia, excessive hunger, nausea No significant hypoglycemic spells noted. Medications as noted below. Taking them regularly without complication/adverse reaction being reported today.    History Meredith Anderson has a past medical history of A-fib ([place]); Allergy; Anxiety; Arthritis; CA - cancer of parotid gland; Cataract; Dyshydrosis; Hemorrhoids; Hyperlipidemia; Hypertension; Menopause; NIDDM (non-insulin dependent diabetes mellitus); Osteopenia (2016); and Thyroid disease.   She has a past surgical history that includes Tubal ligation; bunionectomy bilateral; Abdominal hysterectomy; Eye surgery (Bilateral); Ablation; Rotator cuff repair (Right); Lumbar disc surgery; and Pacemaker insertion (12/14/2015).   Her family history includes Atrial fibrillation in her father; Cancer in her sister, sister, and sister; Dementia in her brother; Diabetes in her brother,  daughter, mother, sister, sister, and sister; Heart attack (age of onset: 27) in her brother; Heart disease in her brother and father; Hyperlipidemia in her brother, sister, and sister; Hypertension in her brother; Psoriasis in her daughter; Rheum arthritis in her daughter; Stroke in her mother.She reports that she has never smoked. She has never used smokeless tobacco. She reports that she does not drink alcohol or use drugs.  Current Outpatient Prescriptions on File Prior to Visit  Medication Sig Dispense Refill  . ACCU-CHEK AVIVA PLUS test strip USE TO CHECK BLOOD SUGAR ONCE A DAY OR AS INSTRUCTED 50 each 2  . Acetaminophen 500 MG coapsule     . bismuth subsalicylate (PEPTO BISMOL) 262 MG chewable tablet as needed. Reported on 07/13/2015    . cholecalciferol (VITAMIN D) 1000 UNITS tablet Take 1,000 Units by mouth daily.    Marland Kitchen conjugated estrogens (PREMARIN) vaginal cream Place 1 g vaginally 2 (two) times a week. Place vaginally twice a week.    . diclofenac sodium (VOLTAREN) 1 % GEL Apply 1 to 2 grams to area of pain up to qid as needed 100 g 2  . diltiazem (CARDIZEM CD) 180 MG 24 hr capsule Take 180 mg by mouth.    . diphenhydrAMINE (BENADRYL) 25 mg capsule 25 mg as needed.     . fexofenadine (ALLEGRA) 180 MG tablet Take 1 tablet (180 mg total) by mouth daily. For allergy symptoms 30 tablet 11  . furosemide (LASIX) 40 MG tablet Take 1 tablet (40 mg total) by mouth daily. 30 tablet 3  . hydrocortisone (PROCTOZONE-HC) 2.5 % rectal cream APPLY TO RECTAL AREA EVERY 6 HOURS AS NEEDED FOR HEMORRHOIDS 30 g 2  . levothyroxine (SYNTHROID, LEVOTHROID) 137 MCG tablet Take 1 tablet (137 mcg total) by mouth daily  before breakfast. 90 tablet 3  . magnesium oxide (MAG-OX) 400 MG tablet 400 mg.    . pioglitazone (ACTOS) 30 MG tablet Take 1 tablet (30 mg total) by mouth daily. 90 tablet 3  . potassium chloride SA (K-DUR,KLOR-CON) 20 MEQ tablet Take 1 tablet (20 mEq total) by mouth daily. 30 tablet 3  .  propafenone (RYTHMOL) 225 MG tablet TAKE 1 TABLET EVERY 8 HOURS 270 tablet 1  . rosuvastatin (CRESTOR) 20 MG tablet Take 1 tablet (20 mg total) by mouth daily. 90 tablet 3  . TETRAHYDROZOLINE HCL OP Apply to eye. Reported on 01/02/2016    . XARELTO 15 MG TABS tablet TAKE 1 TABLET (15 MG TOTAL) BY MOUTH DAILY WITH SUPPER. 90 tablet 1   No current facility-administered medications on file prior to visit.     ROS Review of Systems  Constitutional: Negative for activity change, appetite change and fever.  HENT: Negative for congestion, rhinorrhea and sore throat.   Eyes: Negative for visual disturbance.  Respiratory: Negative for cough and shortness of breath.   Cardiovascular: Negative for chest pain and palpitations.  Gastrointestinal: Negative for abdominal pain, diarrhea and nausea.  Genitourinary: Negative for dysuria.  Musculoskeletal: Negative for arthralgias and myalgias.    Objective:  BP 136/77   Pulse 79   Temp 97 F (36.1 C) (Oral)   Ht 5' 4"  (1.626 m)   Wt 173 lb (78.5 kg)   BMI 29.70 kg/m   BP Readings from Last 3 Encounters:  05/25/16 136/77  05/23/16 136/80  03/08/16 109/65    Wt Readings from Last 3 Encounters:  05/25/16 173 lb (78.5 kg)  05/23/16 173 lb 8 oz (78.7 kg)  03/08/16 171 lb 12.8 oz (77.9 kg)     Physical Exam  Constitutional: She is oriented to person, place, and time. She appears well-developed and well-nourished. No distress.  HENT:  Head: Normocephalic and atraumatic.  Right Ear: External ear normal.  Left Ear: External ear normal.  Nose: Nose normal.  Mouth/Throat: Oropharynx is clear and moist.  Eyes: Conjunctivae and EOM are normal. Pupils are equal, round, and reactive to light.  Neck: Normal range of motion. Neck supple. No thyromegaly present.  Cardiovascular: Normal rate, regular rhythm and normal heart sounds.   No murmur heard. Pulmonary/Chest: Effort normal and breath sounds normal. No respiratory distress. She has no wheezes.  She has no rales.  Abdominal: Soft. Bowel sounds are normal. She exhibits no distension. There is no tenderness.  Lymphadenopathy:    She has no cervical adenopathy.  Neurological: She is alert and oriented to person, place, and time. She has normal reflexes.  Skin: Skin is warm and dry.  Psychiatric: She has a normal mood and affect. Her behavior is normal. Judgment and thought content normal.    Lab Results  Component Value Date   HGBA1C 6.9 08/23/2015   HGBA1C 6.9 04/29/2015   HGBA1C 6.6 11/24/2014    Lab Results  Component Value Date   WBC 6.4 11/21/2015   HGB 12.0 (A) 11/24/2014   HCT 37.4 11/21/2015   PLT 220 11/21/2015   GLUCOSE 134 (H) 02/21/2016   CHOL 115 02/21/2016   TRIG 137 02/21/2016   HDL 42 02/21/2016   LDLCALC 46 02/21/2016   ALT 22 02/21/2016   AST 25 02/21/2016   NA 137 02/21/2016   K 3.8 02/21/2016   CL 94 (L) 02/21/2016   CREATININE 0.97 02/21/2016   BUN 20 02/21/2016   CO2 22 02/21/2016   TSH  4.190 02/21/2016   INR 1.8 07/13/2015   HGBA1C 6.9 08/23/2015    No results found.  Assessment & Plan:   Meredith Anderson was seen today for hypertension.  Diagnoses and all orders for this visit:  Chronic atrial fibrillation ([place]) -     CBC with Differential/Platelet -     CMP14+EGFR  Elevated cholesterol with high triglycerides -     CBC with Differential/Platelet -     CMP14+EGFR  Essential hypertension, benign -     CBC with Differential/Platelet -     CMP14+EGFR  Long term current use of anticoagulant therapy -     CBC with Differential/Platelet -     CMP14+EGFR  Hypothyroidism, unspecified type -     CBC with Differential/Platelet -     CMP14+EGFR -     TSH + free T4  Type 2 diabetes mellitus without complication, without long-term current use of insulin ([facility]) -     CBC with Differential/Platelet -     CMP14+EGFR -     Bayer DCA Hb A1c Waived   I am having Meredith Anderson maintain her TETRAHYDROZOLINE HCL OP, conjugated estrogens, magnesium  oxide, diphenhydrAMINE, bismuth subsalicylate, hydrocortisone, cholecalciferol, rosuvastatin, diclofenac sodium, levothyroxine, Acetaminophen, pioglitazone, fexofenadine, propafenone, furosemide, potassium chloride SA, XARELTO, ACCU-CHEK AVIVA PLUS, and diltiazem.  No orders of the defined types were placed in this encounter.    Follow-up: Return in about 3 months (around 08/25/2016).  Meredith Anderson, M.D.

## 2016-05-28 ENCOUNTER — Other Ambulatory Visit: Payer: Self-pay | Admitting: Family Medicine

## 2016-05-28 LAB — CBC WITH DIFFERENTIAL/PLATELET

## 2016-05-28 LAB — CMP14+EGFR
ALK PHOS: 120 IU/L — AB (ref 39–117)
ALT: 10 IU/L (ref 0–32)
AST: 16 IU/L (ref 0–40)
Albumin/Globulin Ratio: 1.7 (ref 1.2–2.2)
Albumin: 5.1 g/dL — ABNORMAL HIGH (ref 3.5–4.8)
BUN/Creatinine Ratio: 17 (ref 12–28)
BUN: 18 mg/dL (ref 8–27)
Bilirubin Total: 0.4 mg/dL (ref 0.0–1.2)
CO2: 27 mmol/L (ref 18–29)
CREATININE: 1.05 mg/dL — AB (ref 0.57–1.00)
Calcium: 9.8 mg/dL (ref 8.7–10.3)
Chloride: 97 mmol/L (ref 96–106)
GFR calc Af Amer: 60 mL/min/{1.73_m2} (ref >59)
GFR calc non Af Amer: 52 mL/min/{1.73_m2} — ABNORMAL LOW (ref >59)
GLUCOSE: 137 mg/dL — AB (ref 65–99)
Globulin, Total: 3 g/dL (ref 1.5–4.5)
Potassium: 3.9 mmol/L (ref 3.5–5.2)
SODIUM: 141 mmol/L (ref 134–144)
Total Protein: 8.1 g/dL (ref 6.0–8.5)

## 2016-05-28 LAB — TSH+FREE T4
FREE T4: 1.45 ng/dL (ref 0.82–1.77)
TSH: 4.69 u[IU]/mL — AB (ref 0.450–4.500)

## 2016-05-28 MED ORDER — LEVOTHYROXINE SODIUM 150 MCG PO TABS: 150.0000 ug | ORAL_TABLET | Freq: Every day | ORAL | 1 refills | Status: DC

## 2016-05-29 ENCOUNTER — Other Ambulatory Visit: Payer: Self-pay | Admitting: *Deleted

## 2016-05-29 DIAGNOSIS — E039 Hypothyroidism, unspecified: Secondary | ICD-10-CM

## 2016-05-29 NOTE — Progress Notes (Signed)
Ordered per Dr. Livia Snellen.

## 2016-05-30 ENCOUNTER — Other Ambulatory Visit: Payer: Self-pay | Admitting: Family Medicine

## 2016-06-03 ENCOUNTER — Other Ambulatory Visit: Payer: Self-pay | Admitting: Family Medicine

## 2016-06-07 DIAGNOSIS — Z950 Presence of cardiac pacemaker: Secondary | ICD-10-CM | POA: Diagnosis not present

## 2016-06-07 DIAGNOSIS — Z888 Allergy status to other drugs, medicaments and biological substances status: Secondary | ICD-10-CM | POA: Diagnosis not present

## 2016-06-07 DIAGNOSIS — E119 Type 2 diabetes mellitus without complications: Secondary | ICD-10-CM | POA: Diagnosis not present

## 2016-06-07 DIAGNOSIS — I480 Paroxysmal atrial fibrillation: Secondary | ICD-10-CM | POA: Diagnosis not present

## 2016-06-07 DIAGNOSIS — Z9889 Other specified postprocedural states: Secondary | ICD-10-CM | POA: Diagnosis not present

## 2016-06-07 DIAGNOSIS — I495 Sick sinus syndrome: Secondary | ICD-10-CM | POA: Insufficient documentation

## 2016-06-07 DIAGNOSIS — Z7901 Long term (current) use of anticoagulants: Secondary | ICD-10-CM | POA: Diagnosis not present

## 2016-06-07 DIAGNOSIS — E785 Hyperlipidemia, unspecified: Secondary | ICD-10-CM | POA: Diagnosis not present

## 2016-06-07 DIAGNOSIS — Z79899 Other long term (current) drug therapy: Secondary | ICD-10-CM | POA: Diagnosis not present

## 2016-06-07 DIAGNOSIS — I10 Essential (primary) hypertension: Secondary | ICD-10-CM | POA: Diagnosis not present

## 2016-06-11 ENCOUNTER — Other Ambulatory Visit: Payer: Self-pay | Admitting: Family Medicine

## 2016-06-19 ENCOUNTER — Encounter: Payer: Self-pay | Admitting: *Deleted

## 2016-07-08 ENCOUNTER — Other Ambulatory Visit: Payer: Self-pay | Admitting: Family Medicine

## 2016-07-13 ENCOUNTER — Encounter: Payer: Self-pay | Admitting: Family Medicine

## 2016-07-13 ENCOUNTER — Ambulatory Visit (INDEPENDENT_AMBULATORY_CARE_PROVIDER_SITE_OTHER): Payer: Medicare Other | Admitting: Family Medicine

## 2016-07-13 VITALS — BP 131/81 | HR 81 | Temp 97.4°F | Ht 64.0 in | Wt 173.0 lb

## 2016-07-13 DIAGNOSIS — E039 Hypothyroidism, unspecified: Secondary | ICD-10-CM

## 2016-07-13 DIAGNOSIS — E782 Mixed hyperlipidemia: Secondary | ICD-10-CM

## 2016-07-13 DIAGNOSIS — E119 Type 2 diabetes mellitus without complications: Secondary | ICD-10-CM

## 2016-07-13 DIAGNOSIS — I10 Essential (primary) hypertension: Secondary | ICD-10-CM

## 2016-07-13 DIAGNOSIS — I482 Chronic atrial fibrillation, unspecified: Secondary | ICD-10-CM

## 2016-07-13 DIAGNOSIS — Z7901 Long term (current) use of anticoagulants: Secondary | ICD-10-CM | POA: Diagnosis not present

## 2016-07-13 LAB — BAYER DCA HB A1C WAIVED: HB A1C: 6.8 % (ref <7.0)

## 2016-07-13 NOTE — Progress Notes (Signed)
Subjective:  Patient ID: Meredith Anderson, female    DOB: 1940-12-08  Age: 76 y.o. MRN: [id number]  CC: Hypertension (pt here today for routine follow up on her HTN and thyroid, needs bloodwork)   HPI Meredith Anderson presents for  follow-up of hypertension. Patient has no history of headache chest pain or shortness of breath or recent cough. Patient also denies symptoms of TIA such as numbness weakness lateralizing. Patient checks  blood pressure at home and has not had any elevated readings recently. Patient denies side effects from his medication. States taking it regularly.  Patient also  in for follow-up of elevated cholesterol. Doing well without complaints on current medication. Denies side effects of statin including myalgia and arthralgia and nausea. Also in today for liver function testing. Currently no chest pain, shortness of breath or other cardiovascular related symptoms noted.  Follow-up of diabetes. Patient does check blood sugar at home. Readings run between 140 and 150 Patient denies symptoms such as polyuria, polydipsia, excessive hunger, nausea No significant hypoglycemic spells noted. Medications as noted below. Taking them regularly without complication/adverse reaction being reported today.   Patient presents for follow-up on  thyroid. The patient has a history of hypothyroidism for many years. It has been stable recently. Pt. denies any change in  voice, loss of hair, heat or cold intolerance. Energy level has been adequate to good. Patient denies constipation and diarrhea. No myxedema. Medication is as noted below. Verified that pt is taking it daily on an empty stomach. Well tolerated. History Meredith Anderson has a past medical history of A-fib ([place]); Allergy; Anxiety; Arthritis; CA - cancer of parotid gland; Cataract; Dyshydrosis; Hemorrhoids; Hyperlipidemia; Hypertension; Menopause; NIDDM (non-insulin dependent diabetes mellitus); Osteopenia (2016); and Thyroid disease.   She has a  past surgical history that includes Tubal ligation; bunionectomy bilateral; Abdominal hysterectomy; Eye surgery (Bilateral); Ablation; Rotator cuff repair (Right); Lumbar disc surgery; and Pacemaker insertion (12/14/2015).   Her family history includes Atrial fibrillation in her father; Cancer in her sister, sister, and sister; Dementia in her brother; Diabetes in her brother, daughter, mother, sister, sister, and sister; Heart attack (age of onset: 68) in her brother; Heart disease in her brother and father; Hyperlipidemia in her brother, sister, and sister; Hypertension in her brother; Psoriasis in her daughter; Rheum arthritis in her daughter; Stroke in her mother.She reports that she has never smoked. She has never used smokeless tobacco. She reports that she does not drink alcohol or use drugs.  Current Outpatient Prescriptions on File Prior to Visit  Medication Sig Dispense Refill  . ACCU-CHEK AVIVA PLUS test strip USE TO CHECK BLOOD SUGAR ONCE A DAY OR AS INSTRUCTED 50 each 2  . Acetaminophen 500 MG coapsule     . bismuth subsalicylate (PEPTO BISMOL) 262 MG chewable tablet as needed. Reported on 07/13/2015    . cholecalciferol (VITAMIN D) 1000 UNITS tablet Take 1,000 Units by mouth daily.    Marland Kitchen conjugated estrogens (PREMARIN) vaginal cream Place 1 g vaginally 2 (two) times a week. Place vaginally twice a week.    . diclofenac sodium (VOLTAREN) 1 % GEL Apply 1 to 2 grams to area of pain up to qid as needed 100 g 2  . diltiazem (CARDIZEM CD) 180 MG 24 hr capsule Take 180 mg by mouth.    . diphenhydrAMINE (BENADRYL) 25 mg capsule 25 mg as needed.     Marland Kitchen escitalopram (LEXAPRO) 10 MG tablet TAKE 1 TABLET DAILY (REPLACES CELEXA) 90 tablet 0  .  fexofenadine (ALLEGRA) 180 MG tablet Take 1 tablet (180 mg total) by mouth daily. For allergy symptoms 30 tablet 11  . furosemide (LASIX) 40 MG tablet TAKE 1 TABLET (40 MG TOTAL) BY MOUTH DAILY. 30 tablet 5  . hydrocortisone (PROCTOZONE-HC) 2.5 % rectal cream  APPLY TO RECTAL AREA EVERY 6 HOURS AS NEEDED FOR HEMORRHOIDS 30 g 2  . KLOR-CON M20 20 MEQ tablet TAKE 1 TABLET (20 MEQ TOTAL) BY MOUTH DAILY. 30 tablet 5  . levothyroxine (SYNTHROID, LEVOTHROID) 150 MCG tablet Take 1 tablet (150 mcg total) by mouth daily before breakfast. 90 tablet 1  . magnesium oxide (MAG-OX) 400 MG tablet 400 mg.    . pioglitazone (ACTOS) 30 MG tablet Take 1 tablet (30 mg total) by mouth daily. 90 tablet 3  . propafenone (RYTHMOL) 225 MG tablet TAKE 1 TABLET EVERY 8 HOURS 270 tablet 0  . rosuvastatin (CRESTOR) 20 MG tablet TAKE 1 TABLET DAILY 90 tablet 1  . TETRAHYDROZOLINE HCL OP Apply to eye. Reported on 01/02/2016    . XARELTO 15 MG TABS tablet TAKE 1 TABLET (15 MG TOTAL) BY MOUTH DAILY WITH SUPPER. 90 tablet 1   No current facility-administered medications on file prior to visit.     ROS Review of Systems  Constitutional: Negative for activity change, appetite change and fever.  HENT: Negative for congestion, rhinorrhea and sore throat.   Eyes: Negative for visual disturbance.  Respiratory: Negative for cough and shortness of breath.   Cardiovascular: Negative for chest pain and palpitations.  Gastrointestinal: Negative for abdominal pain, diarrhea and nausea.  Genitourinary: Negative for dysuria.  Musculoskeletal: Negative for arthralgias and myalgias.    Objective:  BP 131/81   Pulse 81   Temp 97.4 F (36.3 C) (Oral)   Ht 5' 4"  (1.626 m)   Wt 173 lb (78.5 kg)   BMI 29.70 kg/m   BP Readings from Last 3 Encounters:  07/13/16 131/81  05/25/16 136/77  05/23/16 136/80    Wt Readings from Last 3 Encounters:  07/13/16 173 lb (78.5 kg)  05/25/16 173 lb (78.5 kg)  05/23/16 173 lb 8 oz (78.7 kg)     Physical Exam  Constitutional: She is oriented to person, place, and time. She appears well-developed and well-nourished. No distress.  HENT:  Head: Normocephalic and atraumatic.  Right Ear: External ear normal.  Left Ear: External ear normal.  Nose:  Nose normal.  Mouth/Throat: Oropharynx is clear and moist.  Eyes: Conjunctivae and EOM are normal. Pupils are equal, round, and reactive to light.  Neck: Normal range of motion. Neck supple. No thyromegaly present.  Cardiovascular: Normal rate, regular rhythm and normal heart sounds.   No murmur heard. Pulmonary/Chest: Effort normal and breath sounds normal. No respiratory distress. She has no wheezes. She has no rales.  Abdominal: Soft. Bowel sounds are normal. She exhibits no distension. There is no tenderness.  Lymphadenopathy:    She has no cervical adenopathy.  Neurological: She is alert and oriented to person, place, and time. She has normal reflexes.  Skin: Skin is warm and dry.  Psychiatric: She has a normal mood and affect. Her behavior is normal. Judgment and thought content normal.   Diabetic Foot Exam - Simple   Simple Foot Form Diabetic Foot exam was performed with the following findings:  Yes 07/13/2016  1:21 PM  Visual Inspection No deformities, no ulcerations, no other skin breakdown bilaterally:  Yes Sensation Testing Intact to touch and monofilament testing bilaterally:  Yes Pulse Check Posterior  Tibialis and Dorsalis pulse intact bilaterally:  Yes Comments       Assessment & Plan:   Meredith Anderson was seen today for hypertension.  Diagnoses and all orders for this visit:  Essential hypertension, benign  Type 2 diabetes mellitus without complication, without long-term current use of insulin (HCC) -     TSH + free T4 -     Microalbumin / creatinine urine ratio -     Bayer DCA Hb A1c Waived -     Lipid panel -     CMP14+EGFR  Hypothyroidism, unspecified type -     TSH + free T4  Chronic atrial fibrillation (HCC)  Long term current use of anticoagulant therapy  Elevated cholesterol with high triglycerides   I am having Meredith Anderson maintain her TETRAHYDROZOLINE HCL OP, conjugated estrogens, magnesium oxide, diphenhydrAMINE, bismuth subsalicylate, hydrocortisone,  cholecalciferol, diclofenac sodium, Acetaminophen, pioglitazone, fexofenadine, XARELTO, ACCU-CHEK AVIVA PLUS, diltiazem, levothyroxine, escitalopram, rosuvastatin, furosemide, KLOR-CON M20, and propafenone.  No orders of the defined types were placed in this encounter.  Allergies as of 07/13/2016      Reactions   Amlodipine    Other reaction(s): Other (See Comments) Swollen ankles   Dronedarone Other (See Comments)   Increased BG   Glipizide-metformin Hcl    Other reaction(s): Diarrhea, Other (See Comments) Weakness   Metformin    Other reaction(s): Diarrhea   Niaspan [niacin] Rash      Medication List       Accurate as of 07/13/16  1:23 PM. Always use your most recent med list.          ACCU-CHEK AVIVA PLUS test strip Generic drug:  glucose blood USE TO CHECK BLOOD SUGAR ONCE A DAY OR AS INSTRUCTED   Acetaminophen 500 MG coapsule   bismuth subsalicylate 505 MG chewable tablet Commonly known as:  PEPTO BISMOL as needed. Reported on 07/13/2015   cholecalciferol 1000 units tablet Commonly known as:  VITAMIN D Take 1,000 Units by mouth daily.   conjugated estrogens vaginal cream Commonly known as:  PREMARIN Place 1 g vaginally 2 (two) times a week. Place vaginally twice a week.   diclofenac sodium 1 % Gel Commonly known as:  VOLTAREN Apply 1 to 2 grams to area of pain up to qid as needed   diltiazem 180 MG 24 hr capsule Commonly known as:  CARDIZEM CD Take 180 mg by mouth.   diphenhydrAMINE 25 mg capsule Commonly known as:  BENADRYL 25 mg as needed.   escitalopram 10 MG tablet Commonly known as:  LEXAPRO TAKE 1 TABLET DAILY (REPLACES CELEXA)   fexofenadine 180 MG tablet Commonly known as:  ALLEGRA Take 1 tablet (180 mg total) by mouth daily. For allergy symptoms   furosemide 40 MG tablet Commonly known as:  LASIX TAKE 1 TABLET (40 MG TOTAL) BY MOUTH DAILY.   hydrocortisone 2.5 % rectal cream Commonly known as:  PROCTOZONE-HC APPLY TO RECTAL AREA EVERY  6 HOURS AS NEEDED FOR HEMORRHOIDS   KLOR-CON M20 20 MEQ tablet Generic drug:  potassium chloride SA TAKE 1 TABLET (20 MEQ TOTAL) BY MOUTH DAILY.   levothyroxine 150 MCG tablet Commonly known as:  SYNTHROID, LEVOTHROID Take 1 tablet (150 mcg total) by mouth daily before breakfast.   magnesium oxide 400 MG tablet Commonly known as:  MAG-OX 400 mg.   pioglitazone 30 MG tablet Commonly known as:  ACTOS Take 1 tablet (30 mg total) by mouth daily.   propafenone 225 MG tablet Commonly known as:  RYTHMOL TAKE  1 TABLET EVERY 8 HOURS   rosuvastatin 20 MG tablet Commonly known as:  CRESTOR TAKE 1 TABLET DAILY   TETRAHYDROZOLINE HCL OP Apply to eye. Reported on 01/02/2016   XARELTO 15 MG Tabs tablet Generic drug:  Rivaroxaban TAKE 1 TABLET (15 MG TOTAL) BY MOUTH DAILY WITH SUPPER.        Follow-up: Return in about 3 months (around 10/11/2016) for diabetes, hypertension, cholesterol.  Claretta Fraise, M.D.

## 2016-07-14 LAB — CMP14+EGFR
ALT: 12 IU/L (ref 0–32)
AST: 14 IU/L (ref 0–40)
Albumin/Globulin Ratio: 1.9 (ref 1.2–2.2)
Albumin: 4.8 g/dL (ref 3.5–4.8)
Alkaline Phosphatase: 108 IU/L (ref 39–117)
BUN/Creatinine Ratio: 16 (ref 12–28)
BUN: 23 mg/dL (ref 8–27)
Bilirubin Total: 0.4 mg/dL (ref 0.0–1.2)
CALCIUM: 9.5 mg/dL (ref 8.7–10.3)
CO2: 26 mmol/L (ref 18–29)
CREATININE: 1.45 mg/dL — AB (ref 0.57–1.00)
Chloride: 97 mmol/L (ref 96–106)
GFR, EST AFRICAN AMERICAN: 41 mL/min/{1.73_m2} — AB (ref >59)
GFR, EST NON AFRICAN AMERICAN: 35 mL/min/{1.73_m2} — AB (ref >59)
GLUCOSE: 99 mg/dL (ref 65–99)
Globulin, Total: 2.5 g/dL (ref 1.5–4.5)
Potassium: 4.2 mmol/L (ref 3.5–5.2)
Sodium: 141 mmol/L (ref 134–144)
TOTAL PROTEIN: 7.3 g/dL (ref 6.0–8.5)

## 2016-07-14 LAB — TSH+FREE T4
Free T4: 1.47 ng/dL (ref 0.82–1.77)
TSH: 2.4 u[IU]/mL (ref 0.450–4.500)

## 2016-07-14 LAB — LIPID PANEL
CHOL/HDL RATIO: 3.2 ratio (ref 0.0–4.4)
Cholesterol, Total: 122 mg/dL (ref 100–199)
HDL: 38 mg/dL — ABNORMAL LOW (ref >39)
LDL Calculated: 43 mg/dL (ref 0–99)
TRIGLYCERIDES: 204 mg/dL — AB (ref 0–149)
VLDL Cholesterol Cal: 41 mg/dL — ABNORMAL HIGH (ref 5–40)

## 2016-07-17 DIAGNOSIS — Y92007 Garden or yard of unspecified non-institutional (private) residence as the place of occurrence of the external cause: Secondary | ICD-10-CM | POA: Diagnosis not present

## 2016-07-17 DIAGNOSIS — S9031XA Contusion of right foot, initial encounter: Secondary | ICD-10-CM | POA: Diagnosis not present

## 2016-07-17 DIAGNOSIS — W172XXA Fall into hole, initial encounter: Secondary | ICD-10-CM | POA: Diagnosis not present

## 2016-07-17 DIAGNOSIS — S99921A Unspecified injury of right foot, initial encounter: Secondary | ICD-10-CM | POA: Diagnosis not present

## 2016-07-17 DIAGNOSIS — I4891 Unspecified atrial fibrillation: Secondary | ICD-10-CM | POA: Diagnosis not present

## 2016-07-17 DIAGNOSIS — M25571 Pain in right ankle and joints of right foot: Secondary | ICD-10-CM | POA: Diagnosis not present

## 2016-07-17 DIAGNOSIS — Z7984 Long term (current) use of oral hypoglycemic drugs: Secondary | ICD-10-CM | POA: Diagnosis not present

## 2016-07-17 DIAGNOSIS — S93601A Unspecified sprain of right foot, initial encounter: Secondary | ICD-10-CM | POA: Diagnosis not present

## 2016-07-17 DIAGNOSIS — E119 Type 2 diabetes mellitus without complications: Secondary | ICD-10-CM | POA: Diagnosis not present

## 2016-07-17 DIAGNOSIS — Z7901 Long term (current) use of anticoagulants: Secondary | ICD-10-CM | POA: Diagnosis not present

## 2016-07-23 ENCOUNTER — Encounter: Payer: Self-pay | Admitting: Family

## 2016-07-23 ENCOUNTER — Ambulatory Visit (INDEPENDENT_AMBULATORY_CARE_PROVIDER_SITE_OTHER): Payer: Medicare Other | Admitting: Family

## 2016-07-23 VITALS — BP 139/84 | HR 85 | Temp 97.2°F | Ht 64.0 in | Wt 171.4 lb

## 2016-07-23 DIAGNOSIS — H61899 Other specified disorders of external ear, unspecified ear: Secondary | ICD-10-CM

## 2016-07-23 DIAGNOSIS — L989 Disorder of the skin and subcutaneous tissue, unspecified: Secondary | ICD-10-CM | POA: Diagnosis not present

## 2016-07-23 NOTE — Patient Instructions (Signed)
Allergic Rhinitis Allergic rhinitis is when the mucous membranes in the nose respond to allergens. Allergens are particles in the air that cause your body to have an allergic reaction. This causes you to release allergic antibodies. Through a chain of events, these eventually cause you to release histamine into the blood stream. Although meant to protect the body, it is this release of histamine that causes your discomfort, such as frequent sneezing, congestion, and an itchy, runny nose. What are the causes? Seasonal allergic rhinitis (hay fever) is caused by pollen allergens that may come from grasses, trees, and weeds. Year-round allergic rhinitis (perennial allergic rhinitis) is caused by allergens such as house dust mites, pet dander, and mold spores. What are the signs or symptoms?  Nasal stuffiness (congestion).  Itchy, runny nose with sneezing and tearing of the eyes. How is this diagnosed? Your health care provider can help you determine the allergen or allergens that trigger your symptoms. If you and your health care provider are unable to determine the allergen, skin or blood testing may be used. Your health care provider will diagnose your condition after taking your health history and performing a physical exam. Your health care provider may assess you for other related conditions, such as asthma, pink eye, or an ear infection. How is this treated? Allergic rhinitis does not have a cure, but it can be controlled by:  Medicines that block allergy symptoms. These may include allergy shots, nasal sprays, and oral antihistamines.  Avoiding the allergen. Hay fever may often be treated with antihistamines in pill or nasal spray forms. Antihistamines block the effects of histamine. There are over-the-counter medicines that may help with nasal congestion and swelling around the eyes. Check with your health care provider before taking or giving this medicine. If avoiding the allergen or the  medicine prescribed do not work, there are many new medicines your health care provider can prescribe. Stronger medicine may be used if initial measures are ineffective. Desensitizing injections can be used if medicine and avoidance does not work. Desensitization is when a patient is given ongoing shots until the body becomes less sensitive to the allergen. Make sure you follow up with your health care provider if problems continue. Follow these instructions at home: It is not possible to completely avoid allergens, but you can reduce your symptoms by taking steps to limit your exposure to them. It helps to know exactly what you are allergic to so that you can avoid your specific triggers. Contact a health care provider if:  You have a fever.  You develop a cough that does not stop easily (persistent).  You have shortness of breath.  You start wheezing.  Symptoms interfere with normal daily activities. This information is not intended to replace advice given to you by your health care provider. Make sure you discuss any questions you have with your health care provider. Document Released: 03/13/2001 Document Revised: 02/17/2016 Document Reviewed: 02/23/2013 Elsevier Interactive Patient Education  2017 [employer]  

## 2016-07-23 NOTE — Progress Notes (Signed)
   Subjective:    Patient ID: Meredith Anderson, female    DOB: 04-Aug-1940, 76 y.o.   MRN: [id number]  HPI  PT presents to the office today with a small amount of bleeding from her right ear. PT denies any pain or discharge, but states she is on xarelto and was worried. Pt brought in two q-tips that had scant amount of bright red blood present.    Review of Systems  Eyes: Positive for discharge (blood).  All other systems reviewed and are negative.      Objective:   Physical Exam  Constitutional: She is oriented to person, place, and time. She appears well-developed and well-nourished. No distress.  HENT:  Head: Normocephalic and atraumatic.  Right Ear: There is tenderness (small blood scab present in right ear canal).  Left Ear: External ear normal.  Nose: Nose normal.  Mouth/Throat: Oropharynx is clear and moist.  Eyes: Pupils are equal, round, and reactive to light.  Neck: Normal range of motion. Neck supple. No thyromegaly present.  Cardiovascular: Normal rate, regular rhythm, normal heart sounds and intact distal pulses.   No murmur heard. Pulmonary/Chest: Effort normal and breath sounds normal. No respiratory distress. She has no wheezes.  Abdominal: Soft. Bowel sounds are normal. She exhibits no distension. There is no tenderness.  Musculoskeletal: Normal range of motion. She exhibits no edema or tenderness.  Neurological: She is alert and oriented to person, place, and time. She has normal reflexes. No cranial nerve deficit.  Skin: Skin is warm and dry.  Psychiatric: She has a normal mood and affect. Her behavior is normal. Judgment and thought content normal.  Vitals reviewed.    BP 139/84   Pulse 85   Temp 97.2 F (36.2 C) (Oral)   Ht 5\' 4"  (1.626 m)   Wt 171 lb 6.4 oz (77.7 kg)   BMI 29.42 kg/m       Assessment & Plan:  1. Lesion of ear canal -Do not stick anything into ears Continue xarelto Report any other bleeding  Continue flonase for sinus issues RTO  prn   Evelina Dun, FNP

## 2016-07-26 ENCOUNTER — Ambulatory Visit (INDEPENDENT_AMBULATORY_CARE_PROVIDER_SITE_OTHER): Payer: Medicare Other

## 2016-07-26 ENCOUNTER — Encounter: Payer: Self-pay | Admitting: Family Medicine

## 2016-07-26 ENCOUNTER — Ambulatory Visit (INDEPENDENT_AMBULATORY_CARE_PROVIDER_SITE_OTHER): Payer: Medicare Other | Admitting: Family Medicine

## 2016-07-26 VITALS — BP 118/69 | HR 87 | Temp 97.6°F | Ht 64.0 in | Wt 171.5 lb

## 2016-07-26 DIAGNOSIS — M25512 Pain in left shoulder: Secondary | ICD-10-CM

## 2016-07-26 NOTE — Progress Notes (Addendum)
Subjective:  Patient ID: Meredith Anderson, female    DOB: 23-Dec-1940  Age: 76 y.o. MRN: [id number]  CC: Shoulder Pain (pt here today c/o left shoulder/arm pain for a few days, she had a fall on jan 16th and caught herself with her hands and thinks it is related to that)   HPI VERONICA DENAULT presents for Pain noted at the superior border of the trapezius and into the ball of the shoulder at the deltoid. Limits her ability to abduct the left shoulder primarily.   History Soraya has a past medical history of A-fib ([place]); Allergy; Anxiety; Arthritis; CA - cancer of parotid gland; Cataract; Dyshydrosis; Hemorrhoids; Hyperlipidemia; Hypertension; Menopause; NIDDM (non-insulin dependent diabetes mellitus); Osteopenia (2016); and Thyroid disease.   She has a past surgical history that includes Tubal ligation; bunionectomy bilateral; Abdominal hysterectomy; Eye surgery (Bilateral); Ablation; Rotator cuff repair (Right); Lumbar disc surgery; and Pacemaker insertion (12/14/2015).   Her family history includes Atrial fibrillation in her father; Cancer in her sister, sister, and sister; Dementia in her brother; Diabetes in her brother, daughter, mother, sister, sister, and sister; Heart attack (age of onset: 72) in her brother; Heart disease in her brother and father; Hyperlipidemia in her brother, sister, and sister; Hypertension in her brother; Psoriasis in her daughter; Rheum arthritis in her daughter; Stroke in her mother.She reports that she has never smoked. She has never used smokeless tobacco. She reports that she does not drink alcohol or use drugs.    ROS Review of Systems  Constitutional: Negative for fever.  HENT: Negative for congestion, rhinorrhea and sore throat.   Respiratory: Negative for cough and shortness of breath.   Cardiovascular: Negative for chest pain and palpitations.  Gastrointestinal: Negative for abdominal pain.  Musculoskeletal: Negative for myalgias. Arthralgias: see history  oresent illness.    Objective:  BP 118/69   Pulse 87   Temp 97.6 F (36.4 C) (Oral)   Ht 5\' 4"  (1.626 m)   Wt 171 lb 8 oz (77.8 kg)   BMI 29.44 kg/m   BP Readings from Last 3 Encounters:  08/30/16 124/76  07/26/16 118/69  07/23/16 139/84    Wt Readings from Last 3 Encounters:  08/30/16 175 lb (79.4 kg)  07/26/16 171 lb 8 oz (77.8 kg)  07/23/16 171 lb 6.4 oz (77.7 kg)     Physical Exam  Constitutional: She is oriented to person, place, and time. She appears well-developed and well-nourished.  HENT:  Head: Normocephalic.  Cardiovascular: Normal rate and regular rhythm.   No murmur heard. Pulmonary/Chest: Effort normal and breath sounds normal.  Musculoskeletal: She exhibits tenderness (mild at [facility] region). She exhibits no edema or deformity.  Neurological: She is alert and oriented to person, place, and time.  Psychiatric: She has a normal mood and affect.    No results found.  Assessment & Plan:   Shawnte was seen today for shoulder pain.  Diagnoses and all orders for this visit:  Pain in joint of left shoulder -     DG Shoulder Left; Future   XR - moderate DJD L Shoulder   I am having Ms. Hark maintain her TETRAHYDROZOLINE HCL OP, magnesium oxide, diphenhydrAMINE, bismuth subsalicylate, hydrocortisone, cholecalciferol, diclofenac sodium, Acetaminophen, pioglitazone, fexofenadine, ACCU-CHEK AVIVA PLUS, levothyroxine, escitalopram, rosuvastatin, furosemide, KLOR-CON M20, and propafenone.  Allergies as of 07/26/2016      Reactions   Amlodipine    Other reaction(s): Other (See Comments) Swollen ankles   Dronedarone Other (See Comments)   Increased BG  Glipizide-metformin Hcl    Other reaction(s): Diarrhea, Other (See Comments) Weakness   Metformin    Other reaction(s): Diarrhea   Niaspan [niacin] Rash      Medication List       Accurate as of 07/26/16 11:59 PM. Always use your most recent med list.          ACCU-CHEK AVIVA PLUS test strip Generic  drug:  glucose blood USE TO CHECK BLOOD SUGAR ONCE A DAY OR AS INSTRUCTED   Acetaminophen 500 MG coapsule   bismuth subsalicylate 99991111 MG chewable tablet Commonly known as:  PEPTO BISMOL as needed. Reported on 07/13/2015   cholecalciferol 1000 units tablet Commonly known as:  VITAMIN D Take 1,000 Units by mouth daily.   conjugated estrogens vaginal cream Commonly known as:  PREMARIN Place 1 g vaginally 2 (two) times a week. Place vaginally twice a week.   diclofenac sodium 1 % Gel Commonly known as:  VOLTAREN Apply 1 to 2 grams to area of pain up to qid as needed   diltiazem 180 MG 24 hr capsule Commonly known as:  CARDIZEM CD Take 180 mg by mouth.   diphenhydrAMINE 25 mg capsule Commonly known as:  BENADRYL 25 mg as needed.   escitalopram 10 MG tablet Commonly known as:  LEXAPRO TAKE 1 TABLET DAILY (REPLACES CELEXA)   fexofenadine 180 MG tablet Commonly known as:  ALLEGRA Take 1 tablet (180 mg total) by mouth daily. For allergy symptoms   furosemide 40 MG tablet Commonly known as:  LASIX TAKE 1 TABLET (40 MG TOTAL) BY MOUTH DAILY.   hydrocortisone 2.5 % rectal cream Commonly known as:  PROCTOZONE-HC APPLY TO RECTAL AREA EVERY 6 HOURS AS NEEDED FOR HEMORRHOIDS   KLOR-CON M20 20 MEQ tablet Generic drug:  potassium chloride SA TAKE 1 TABLET (20 MEQ TOTAL) BY MOUTH DAILY.   levothyroxine 150 MCG tablet Commonly known as:  SYNTHROID, LEVOTHROID Take 1 tablet (150 mcg total) by mouth daily before breakfast.   magnesium oxide 400 MG tablet Commonly known as:  MAG-OX 400 mg.   pioglitazone 30 MG tablet Commonly known as:  ACTOS Take 1 tablet (30 mg total) by mouth daily.   propafenone 225 MG tablet Commonly known as:  RYTHMOL TAKE 1 TABLET EVERY 8 HOURS   rosuvastatin 20 MG tablet Commonly known as:  CRESTOR TAKE 1 TABLET DAILY   TETRAHYDROZOLINE HCL OP Apply to eye. Reported on 01/02/2016   XARELTO 15 MG Tabs tablet Generic drug:  Rivaroxaban TAKE 1  TABLET (15 MG TOTAL) BY MOUTH DAILY WITH SUPPER.        Follow-up: Return in about 6 weeks (around 09/06/2016).  Claretta Fraise, M.D.

## 2016-08-03 DIAGNOSIS — Z950 Presence of cardiac pacemaker: Secondary | ICD-10-CM | POA: Diagnosis not present

## 2016-08-03 DIAGNOSIS — I495 Sick sinus syndrome: Secondary | ICD-10-CM | POA: Diagnosis not present

## 2016-08-17 ENCOUNTER — Other Ambulatory Visit: Payer: Self-pay | Admitting: Family Medicine

## 2016-08-30 ENCOUNTER — Ambulatory Visit (INDEPENDENT_AMBULATORY_CARE_PROVIDER_SITE_OTHER): Payer: Medicare Other | Admitting: Nurse Practitioner

## 2016-08-30 ENCOUNTER — Encounter: Payer: Self-pay | Admitting: Nurse Practitioner

## 2016-08-30 VITALS — BP 124/76 | HR 83 | Temp 98.6°F | Ht 64.0 in | Wt 175.0 lb

## 2016-08-30 DIAGNOSIS — J0120 Acute ethmoidal sinusitis, unspecified: Secondary | ICD-10-CM | POA: Diagnosis not present

## 2016-08-30 MED ORDER — BENZONATATE 100 MG PO CAPS: 100.0000 mg | ORAL_CAPSULE | Freq: Three times a day (TID) | ORAL | 0 refills | Status: DC | PRN

## 2016-08-30 MED ORDER — AMOXICILLIN 875 MG PO TABS: 875.0000 mg | ORAL_TABLET | Freq: Two times a day (BID) | ORAL | 0 refills | Status: DC

## 2016-08-30 MED ORDER — AZITHROMYCIN 250 MG PO TABS: ORAL_TABLET | ORAL | 0 refills | Status: DC

## 2016-08-30 NOTE — Progress Notes (Addendum)
   Subjective:    Patient ID: Meredith Anderson, female    DOB: 1941-01-12, 76 y.o.   MRN: [id number]  HPI Nasal congestion with clear/yellow drainage worsening for the past week. +cough, without productive sputum. +sore throat. -nausea    Review of Systems  Constitutional: Positive for fever (low grade temperature last night, afebrile today).  HENT: Positive for congestion, postnasal drip, sinus pain, sinus pressure and sore throat.   Eyes: Negative.   Respiratory: Negative.  Negative for shortness of breath.   Cardiovascular: Negative.  Negative for chest pain.  Gastrointestinal: Negative for abdominal pain.  Psychiatric/Behavioral: Negative.   All other systems reviewed and are negative.      Objective:   Physical Exam  Constitutional: She appears well-developed and well-nourished.  HENT:  Right Ear: Hearing, tympanic membrane, external ear and ear canal normal.  Left Ear: Hearing, tympanic membrane, external ear and ear canal normal.  Nose: Mucosal edema, rhinorrhea and sinus tenderness present. Right sinus exhibits frontal sinus tenderness (and ethmoid). Left sinus exhibits frontal sinus tenderness (and ethmoid).  Mouth/Throat: Posterior oropharyngeal erythema present.  Cardiovascular: Normal rate and regular rhythm.   Pulmonary/Chest: Breath sounds normal. No respiratory distress. She has no wheezes.  Abdominal: Soft. Bowel sounds are normal. There is no tenderness.  Skin: Skin is warm and dry.  Psychiatric: She has a normal mood and affect. Her behavior is normal. Judgment and thought content normal.     BP 124/76   Pulse 83   Temp 98.6 F (37 C) (Oral)   Ht 5\' 4"  (1.626 m)   Wt 175 lb (79.4 kg)   BMI 30.04 kg/m       Assessment & Plan:  1. Acute ethmoidal sinusitis, recurrence not specified Meds ordered this encounter  Medications  . azithromycin (ZITHROMAX Z-PAK) 250 MG tablet    Sig: As directed    Dispense:  6 tablet    Refill:  0    Order Specific  Question:   Supervising Provider    Answer:   VINCENT, CAROL L [[id number]]  . benzonatate (TESSALON PERLES) 100 MG capsule    Sig: Take 1 capsule (100 mg total) by mouth 3 (three) times daily as needed.    Dispense:  20 capsule    Refill:  0    Order Specific Question:   Supervising Provider    Answer:   VINCENT, CAROL L [[id number]]   1. Take meds as prescribed 2. Use a cool mist humidifier especially during the winter months and when heat has been humid. 3. Use saline nose sprays frequently 4. Saline irrigations of the nose can be very helpful if done frequently.  * 4X daily for 1 week*  * Use of a nettie pot can be helpful with this. Follow directions with this* 5. Drink plenty of fluids 6. Keep thermostat turn down low 7.For any cough or congestion  Use plain Mucinex- regular strength or max strength is fine   * Children- consult with Pharmacist for dosing 8. For fever or aces or pains- take tylenol or ibuprofen appropriate for age and weight.  * for fevers greater than 101 orally you may alternate ibuprofen and tylenol every  3 hours.   Mary-Margaret Hassell Done, [place] called and cannot take z pak with ryhmol- changed to amoxicillin 875mg  1 po BID X10 days #20 0nrefills.

## 2016-08-30 NOTE — Addendum Note (Signed)
Addended by: Chevis Pretty on: 08/30/2016 03:41 PM   Modules accepted: Orders

## 2016-08-30 NOTE — Patient Instructions (Signed)

## 2016-09-01 ENCOUNTER — Other Ambulatory Visit: Payer: Self-pay | Admitting: Family Medicine

## 2016-09-04 NOTE — Addendum Note (Signed)
Addended by: Claretta Fraise on: 09/04/2016 01:25 PM   Modules accepted: Level of Service

## 2016-10-06 ENCOUNTER — Other Ambulatory Visit: Payer: Self-pay | Admitting: Family Medicine

## 2016-10-15 ENCOUNTER — Other Ambulatory Visit: Payer: Self-pay | Admitting: Family Medicine

## 2016-10-17 ENCOUNTER — Encounter: Payer: Self-pay | Admitting: Family Medicine

## 2016-10-17 ENCOUNTER — Ambulatory Visit (INDEPENDENT_AMBULATORY_CARE_PROVIDER_SITE_OTHER): Payer: Medicare Other | Admitting: Family Medicine

## 2016-10-17 ENCOUNTER — Telehealth: Payer: Self-pay | Admitting: Family Medicine

## 2016-10-17 VITALS — BP 113/66 | HR 74 | Temp 97.6°F | Ht 64.0 in | Wt 170.0 lb

## 2016-10-17 DIAGNOSIS — E039 Hypothyroidism, unspecified: Secondary | ICD-10-CM | POA: Diagnosis not present

## 2016-10-17 DIAGNOSIS — E119 Type 2 diabetes mellitus without complications: Secondary | ICD-10-CM | POA: Diagnosis not present

## 2016-10-17 DIAGNOSIS — I482 Chronic atrial fibrillation, unspecified: Secondary | ICD-10-CM

## 2016-10-17 DIAGNOSIS — E782 Mixed hyperlipidemia: Secondary | ICD-10-CM | POA: Diagnosis not present

## 2016-10-17 DIAGNOSIS — I10 Essential (primary) hypertension: Secondary | ICD-10-CM

## 2016-10-17 LAB — BAYER DCA HB A1C WAIVED: HB A1C (BAYER DCA - WAIVED): 7 % — ABNORMAL HIGH (ref <7.0)

## 2016-10-17 NOTE — Progress Notes (Signed)
Subjective:  Patient ID: VALLEY KE, female    DOB: 1940/11/22  Age: 76 y.o. MRN: [id number]  CC: Hypertension (pt here today for routine follow up on her chronic medical conditions. No other concerns voiced.)   HPI Meredith Anderson presents for  follow-up of hypertension. Patient has no history of headache chest pain or shortness of breath or recent cough. Patient also denies symptoms of TIA such as numbness weakness lateralizing. Patient checks  blood pressure at home. Recent readings have been adequate Patient denies side effects from medication. States taking it regularly.  Patient also  in for follow-up of elevated cholesterol. Doing well without complaints on current medication. Denies side effects of statin including myalgia and arthralgia and nausea. Also in today for liver function testing. Currently no chest pain, shortness of breath or other cardiovascular related symptoms noted.  Follow-up of diabetes. Patient does check blood sugar at home. Readings run between 100 and 140. (Toward the upper numbers fasting, the lower end postprandial) Patient denies symptoms such as polyuria, polydipsia, excessive hunger, nausea No significant hypoglycemic spells noted. Medications reviewed. Pt reports taking them regularly. Pt. denies complication/adverse reaction today.    Patient in for follow-up of atrial fibrillation. Patient denies any recent bouts of chest pain or palpitations. Additionally, patient is taking anticoagulants. Patient denies any recent excessive bleeding episodes including epistaxis, bleeding from the gums, genitalia, rectal bleeding or hematuria. Additionally there has been no excessive bruising. History Hillery has a past medical history of A-fib ([place]); Allergy; Anxiety; Arthritis; CA - cancer of parotid gland; Cataract; Dyshydrosis; Hemorrhoids; Hyperlipidemia; Hypertension; Menopause; NIDDM (non-insulin dependent diabetes mellitus); Osteopenia (2016); and Thyroid disease.    She has a past surgical history that includes Tubal ligation; bunionectomy bilateral; Abdominal hysterectomy; Eye surgery (Bilateral); Ablation; Rotator cuff repair (Right); Lumbar disc surgery; and Pacemaker insertion (12/14/2015).   Her family history includes Atrial fibrillation in her father; Cancer in her sister, sister, and sister; Dementia in her brother; Diabetes in her brother, daughter, mother, sister, sister, and sister; Heart attack (age of onset: 58) in her brother; Heart disease in her brother and father; Hyperlipidemia in her brother, sister, and sister; Hypertension in her brother; Psoriasis in her daughter; Rheum arthritis in her daughter; Stroke in her mother.She reports that she has never smoked. She has never used smokeless tobacco. She reports that she does not drink alcohol or use drugs.  Current Outpatient Prescriptions on File Prior to Visit  Medication Sig Dispense Refill  . ACCU-CHEK AVIVA PLUS test strip USE TO CHECK BLOOD SUGAR ONCE A DAY OR AS INSTRUCTED 50 each 2  . Acetaminophen 500 MG coapsule     . bismuth subsalicylate (PEPTO BISMOL) 262 MG chewable tablet as needed. Reported on 07/13/2015    . cholecalciferol (VITAMIN D) 1000 UNITS tablet Take 1,000 Units by mouth daily.    . diclofenac sodium (VOLTAREN) 1 % GEL Apply 1 to 2 grams to area of pain up to qid as needed 100 g 2  . diphenhydrAMINE (BENADRYL) 25 mg capsule 25 mg as needed.     Marland Kitchen escitalopram (LEXAPRO) 10 MG tablet TAKE 1 TABLET DAILY (REPLACES CELEXA) 90 tablet 0  . fexofenadine (ALLEGRA) 180 MG tablet Take 1 tablet (180 mg total) by mouth daily. For allergy symptoms 30 tablet 11  . furosemide (LASIX) 40 MG tablet TAKE 1 TABLET (40 MG TOTAL) BY MOUTH DAILY. 30 tablet 5  . hydrocortisone (PROCTOZONE-HC) 2.5 % rectal cream APPLY TO RECTAL AREA EVERY 6 HOURS  AS NEEDED FOR HEMORRHOIDS 30 g 2  . KLOR-CON M20 20 MEQ tablet TAKE 1 TABLET (20 MEQ TOTAL) BY MOUTH DAILY. 30 tablet 5  . levothyroxine (SYNTHROID,  LEVOTHROID) 150 MCG tablet Take 1 tablet (150 mcg total) by mouth daily before breakfast. 90 tablet 1  . magnesium oxide (MAG-OX) 400 MG tablet 400 mg.    . pioglitazone (ACTOS) 30 MG tablet TAKE 1 TABLET (30 MG TOTAL) BY MOUTH DAILY. 90 tablet 3  . PREMARIN vaginal cream USING APPLICATOR PLACE 1 GRAM IN VAGINA FOR 3 WEEKS,THEN USE EVERY OTHER NIGHT AS INSTRUCTED 30 g 4  . propafenone (RYTHMOL) 225 MG tablet TAKE 1 TABLET EVERY 8 HOURS 270 tablet 0  . rosuvastatin (CRESTOR) 20 MG tablet TAKE 1 TABLET DAILY 90 tablet 1  . TETRAHYDROZOLINE HCL OP Apply to eye. Reported on 01/02/2016    . XARELTO 15 MG TABS tablet TAKE 1 TABLET (15 MG TOTAL) BY MOUTH DAILY WITH SUPPER. 90 tablet 1   No current facility-administered medications on file prior to visit.     ROS Review of Systems  Constitutional: Negative for activity change, appetite change and fever.  HENT: Negative for congestion, rhinorrhea and sore throat.   Eyes: Negative for visual disturbance.  Respiratory: Negative for cough and shortness of breath.   Cardiovascular: Positive for palpitations (self-limited due to pacemaker and Rythmol). Negative for chest pain.  Gastrointestinal: Negative for abdominal pain, diarrhea and nausea.  Genitourinary: Negative for dysuria.  Musculoskeletal: Negative for arthralgias and myalgias.    Objective:  BP 113/66   Pulse 74   Temp 97.6 F (36.4 C) (Oral)   Ht 5' 4"  (1.626 m)   Wt 170 lb (77.1 kg)   BMI 29.18 kg/m   BP Readings from Last 3 Encounters:  10/17/16 113/66  08/30/16 124/76  07/26/16 118/69    Wt Readings from Last 3 Encounters:  10/17/16 170 lb (77.1 kg)  08/30/16 175 lb (79.4 kg)  07/26/16 171 lb 8 oz (77.8 kg)     Physical Exam  Constitutional: She is oriented to person, place, and time. She appears well-developed and well-nourished. No distress.  HENT:  Head: Normocephalic and atraumatic.  Eyes: Conjunctivae are normal. Pupils are equal, round, and reactive to light.    Neck: Normal range of motion. Neck supple. No thyromegaly present.  Cardiovascular: Normal rate, regular rhythm and normal heart sounds.   No murmur heard. Pulmonary/Chest: Effort normal and breath sounds normal. No respiratory distress. She has no wheezes. She has no rales.  Abdominal: Soft. Bowel sounds are normal. She exhibits no distension. There is no tenderness.  Musculoskeletal: Normal range of motion.  Lymphadenopathy:    She has no cervical adenopathy.  Neurological: She is alert and oriented to person, place, and time.  Skin: Skin is warm and dry.  Psychiatric: She has a normal mood and affect. Her behavior is normal. Judgment and thought content normal.    Diabetic Foot Form - Detailed   Diabetic Foot Exam - detailed Diabetic Foot exam was performed with the following findings:  Yes 10/17/2016  2:44 PM  Is there a history of foot ulcer?:  No Can the patient see the bottom of their feet?:  Yes Are the shoes appropriate in style and fit?:  Yes Is there swelling or and abnormal foot shape?:  No Are the toenails long?:  No Are the toenails thick?:  No Do you have pain in calf while walking?:  No Is there a claw toe deformity?:  No Is there elevated skin temparature?:  No Is there limited skin dorsiflexion?:  No Is there foot or ankle muscle weakness?:  No Are the toenails ingrown?:  No Normal Range of Motion:  Yes Pulse Foot Exam completed.:  Yes  Right posterior Tibialias:  Present Left posterior Tibialias:  Present  Right Dorsalis Pedis:  Present Left Dorsalis Pedis:  Present  Sensory Foot Exam Completed.:  Yes Semmes-Weinstein Monofilament Test R Foot Test Control:  Pos L Foot Test Control:  Pos  R Site 1-Great Toe:  Pos L Site 1-Great Toe:  Pos  R Site 4:  Pos L Site 4:  Pos  R Site 5:  Pos L Site 5:  Pos           Assessment & Plan:   Satara was seen today for hypertension.  Diagnoses and all orders for this visit:  Chronic atrial fibrillation (HCC) -      EKG 12-Lead  Essential hypertension, benign  Elevated cholesterol with high triglycerides  Type 2 diabetes mellitus without complication, without long-term current use of insulin (HCC) -     CBC with Differential/Platelet -     CMP14+EGFR -     Bayer DCA Hb A1c Waived -     Microalbumin / creatinine urine ratio  Hypothyroidism, unspecified type -     CMP14+EGFR -     TSH + free T4   I have discontinued Ms. Schweer's benzonatate and amoxicillin. I am also having her maintain her TETRAHYDROZOLINE HCL OP, magnesium oxide, diphenhydrAMINE, bismuth subsalicylate, hydrocortisone, cholecalciferol, diclofenac sodium, Acetaminophen, fexofenadine, ACCU-CHEK AVIVA PLUS, levothyroxine, escitalopram, rosuvastatin, furosemide, KLOR-CON M20, PREMARIN, XARELTO, propafenone, and pioglitazone.  No orders of the defined types were placed in this encounter.    Follow-up: Return in about 3 months (around 01/16/2017).  Claretta Fraise, M.D.

## 2016-10-18 LAB — CBC WITH DIFFERENTIAL/PLATELET
Basophils Absolute: 0 10*3/uL (ref 0.0–0.2)
Basos: 1 %
EOS (ABSOLUTE): 0.1 10*3/uL (ref 0.0–0.4)
EOS: 2 %
HEMATOCRIT: 35.8 % (ref 34.0–46.6)
HEMOGLOBIN: 11.8 g/dL (ref 11.1–15.9)
IMMATURE GRANULOCYTES: 0 %
Immature Grans (Abs): 0 10*3/uL (ref 0.0–0.1)
Lymphocytes Absolute: 1.5 10*3/uL (ref 0.7–3.1)
Lymphs: 29 %
MCH: 29.9 pg (ref 26.6–33.0)
MCHC: 33 g/dL (ref 31.5–35.7)
MCV: 91 fL (ref 79–97)
MONOCYTES: 11 %
Monocytes Absolute: 0.6 10*3/uL (ref 0.1–0.9)
Neutrophils Absolute: 3 10*3/uL (ref 1.4–7.0)
Neutrophils: 57 %
Platelets: 221 10*3/uL (ref 150–379)
RBC: 3.94 x10E6/uL (ref 3.77–5.28)
RDW: 15.3 % (ref 12.3–15.4)
WBC: 5.2 10*3/uL (ref 3.4–10.8)

## 2016-10-18 LAB — CMP14+EGFR
ALBUMIN: 5 g/dL — AB (ref 3.5–4.8)
ALT: 13 IU/L (ref 0–32)
AST: 17 IU/L (ref 0–40)
Albumin/Globulin Ratio: 1.8 (ref 1.2–2.2)
Alkaline Phosphatase: 112 IU/L (ref 39–117)
BUN / CREAT RATIO: 19 (ref 12–28)
BUN: 26 mg/dL (ref 8–27)
Bilirubin Total: 0.4 mg/dL (ref 0.0–1.2)
CALCIUM: 9.9 mg/dL (ref 8.7–10.3)
CO2: 23 mmol/L (ref 18–29)
CREATININE: 1.4 mg/dL — AB (ref 0.57–1.00)
Chloride: 95 mmol/L — ABNORMAL LOW (ref 96–106)
GFR calc Af Amer: 42 mL/min/{1.73_m2} — ABNORMAL LOW (ref >59)
GFR, EST NON AFRICAN AMERICAN: 37 mL/min/{1.73_m2} — AB (ref >59)
GLOBULIN, TOTAL: 2.8 g/dL (ref 1.5–4.5)
Glucose: 100 mg/dL — ABNORMAL HIGH (ref 65–99)
Potassium: 4.2 mmol/L (ref 3.5–5.2)
SODIUM: 138 mmol/L (ref 134–144)
Total Protein: 7.8 g/dL (ref 6.0–8.5)

## 2016-10-18 LAB — MICROALBUMIN / CREATININE URINE RATIO
CREATININE, UR: 102.7 mg/dL
MICROALB/CREAT RATIO: 7.9 mg/g{creat} (ref 0.0–30.0)
MICROALBUM., U, RANDOM: 8.1 ug/mL

## 2016-10-18 LAB — TSH+FREE T4
Free T4: 1.69 ng/dL (ref 0.82–1.77)
TSH: 1.35 u[IU]/mL (ref 0.450–4.500)

## 2016-10-18 NOTE — Telephone Encounter (Signed)
Patient aware of lab results.

## 2016-11-07 DIAGNOSIS — I447 Left bundle-branch block, unspecified: Secondary | ICD-10-CM | POA: Diagnosis not present

## 2016-11-07 DIAGNOSIS — I480 Paroxysmal atrial fibrillation: Secondary | ICD-10-CM | POA: Diagnosis not present

## 2016-11-07 DIAGNOSIS — R5383 Other fatigue: Secondary | ICD-10-CM | POA: Diagnosis not present

## 2016-11-07 DIAGNOSIS — I495 Sick sinus syndrome: Secondary | ICD-10-CM | POA: Diagnosis not present

## 2016-11-07 DIAGNOSIS — R55 Syncope and collapse: Secondary | ICD-10-CM | POA: Diagnosis not present

## 2016-11-07 DIAGNOSIS — Z950 Presence of cardiac pacemaker: Secondary | ICD-10-CM | POA: Diagnosis not present

## 2016-11-07 DIAGNOSIS — I272 Pulmonary hypertension, unspecified: Secondary | ICD-10-CM | POA: Diagnosis not present

## 2016-11-07 DIAGNOSIS — Z8679 Personal history of other diseases of the circulatory system: Secondary | ICD-10-CM | POA: Diagnosis not present

## 2016-11-07 DIAGNOSIS — E785 Hyperlipidemia, unspecified: Secondary | ICD-10-CM | POA: Diagnosis not present

## 2016-11-07 DIAGNOSIS — E119 Type 2 diabetes mellitus without complications: Secondary | ICD-10-CM | POA: Diagnosis not present

## 2016-11-07 DIAGNOSIS — I499 Cardiac arrhythmia, unspecified: Secondary | ICD-10-CM | POA: Diagnosis not present

## 2016-11-07 DIAGNOSIS — I479 Paroxysmal tachycardia, unspecified: Secondary | ICD-10-CM | POA: Diagnosis not present

## 2016-11-07 DIAGNOSIS — I10 Essential (primary) hypertension: Secondary | ICD-10-CM | POA: Diagnosis not present

## 2016-11-07 DIAGNOSIS — R000 Tachycardia, unspecified: Secondary | ICD-10-CM | POA: Diagnosis not present

## 2016-11-07 DIAGNOSIS — I340 Nonrheumatic mitral (valve) insufficiency: Secondary | ICD-10-CM | POA: Diagnosis not present

## 2016-11-07 DIAGNOSIS — R42 Dizziness and giddiness: Secondary | ICD-10-CM | POA: Diagnosis not present

## 2016-11-07 DIAGNOSIS — I472 Ventricular tachycardia: Secondary | ICD-10-CM | POA: Diagnosis not present

## 2016-11-07 DIAGNOSIS — Z79899 Other long term (current) drug therapy: Secondary | ICD-10-CM | POA: Diagnosis not present

## 2016-11-07 DIAGNOSIS — R531 Weakness: Secondary | ICD-10-CM | POA: Diagnosis not present

## 2016-11-07 DIAGNOSIS — Z9889 Other specified postprocedural states: Secondary | ICD-10-CM | POA: Diagnosis not present

## 2016-11-07 DIAGNOSIS — R0602 Shortness of breath: Secondary | ICD-10-CM | POA: Diagnosis not present

## 2016-11-07 DIAGNOSIS — E039 Hypothyroidism, unspecified: Secondary | ICD-10-CM | POA: Diagnosis not present

## 2016-11-07 DIAGNOSIS — Z7901 Long term (current) use of anticoagulants: Secondary | ICD-10-CM | POA: Diagnosis not present

## 2016-11-08 DIAGNOSIS — I479 Paroxysmal tachycardia, unspecified: Secondary | ICD-10-CM | POA: Diagnosis not present

## 2016-11-08 DIAGNOSIS — Z950 Presence of cardiac pacemaker: Secondary | ICD-10-CM | POA: Diagnosis not present

## 2016-11-10 ENCOUNTER — Other Ambulatory Visit: Payer: Self-pay | Admitting: Family Medicine

## 2016-11-17 ENCOUNTER — Other Ambulatory Visit: Payer: Self-pay | Admitting: Family Medicine

## 2016-11-24 ENCOUNTER — Other Ambulatory Visit: Payer: Self-pay | Admitting: Family Medicine

## 2016-12-01 ENCOUNTER — Other Ambulatory Visit: Payer: Self-pay | Admitting: Family Medicine

## 2016-12-05 DIAGNOSIS — Z9889 Other specified postprocedural states: Secondary | ICD-10-CM | POA: Diagnosis not present

## 2016-12-05 DIAGNOSIS — I10 Essential (primary) hypertension: Secondary | ICD-10-CM | POA: Diagnosis not present

## 2016-12-05 DIAGNOSIS — Z79899 Other long term (current) drug therapy: Secondary | ICD-10-CM | POA: Diagnosis not present

## 2016-12-05 DIAGNOSIS — E119 Type 2 diabetes mellitus without complications: Secondary | ICD-10-CM | POA: Diagnosis not present

## 2016-12-05 DIAGNOSIS — E785 Hyperlipidemia, unspecified: Secondary | ICD-10-CM | POA: Diagnosis not present

## 2016-12-05 DIAGNOSIS — E039 Hypothyroidism, unspecified: Secondary | ICD-10-CM | POA: Diagnosis not present

## 2016-12-05 DIAGNOSIS — I495 Sick sinus syndrome: Secondary | ICD-10-CM | POA: Diagnosis not present

## 2016-12-05 DIAGNOSIS — I480 Paroxysmal atrial fibrillation: Secondary | ICD-10-CM | POA: Diagnosis not present

## 2016-12-05 DIAGNOSIS — Z7901 Long term (current) use of anticoagulants: Secondary | ICD-10-CM | POA: Diagnosis not present

## 2016-12-05 DIAGNOSIS — I471 Supraventricular tachycardia: Secondary | ICD-10-CM | POA: Diagnosis not present

## 2016-12-05 DIAGNOSIS — Z950 Presence of cardiac pacemaker: Secondary | ICD-10-CM | POA: Diagnosis not present

## 2016-12-05 DIAGNOSIS — R55 Syncope and collapse: Secondary | ICD-10-CM | POA: Diagnosis not present

## 2016-12-05 DIAGNOSIS — I340 Nonrheumatic mitral (valve) insufficiency: Secondary | ICD-10-CM | POA: Diagnosis not present

## 2016-12-06 DIAGNOSIS — I480 Paroxysmal atrial fibrillation: Secondary | ICD-10-CM | POA: Diagnosis not present

## 2016-12-06 DIAGNOSIS — Z8679 Personal history of other diseases of the circulatory system: Secondary | ICD-10-CM | POA: Diagnosis not present

## 2016-12-06 DIAGNOSIS — I495 Sick sinus syndrome: Secondary | ICD-10-CM | POA: Diagnosis not present

## 2016-12-06 DIAGNOSIS — Z9889 Other specified postprocedural states: Secondary | ICD-10-CM | POA: Diagnosis not present

## 2016-12-06 DIAGNOSIS — I471 Supraventricular tachycardia: Secondary | ICD-10-CM | POA: Diagnosis not present

## 2016-12-06 DIAGNOSIS — Z950 Presence of cardiac pacemaker: Secondary | ICD-10-CM | POA: Diagnosis not present

## 2016-12-06 DIAGNOSIS — I10 Essential (primary) hypertension: Secondary | ICD-10-CM | POA: Diagnosis not present

## 2016-12-08 ENCOUNTER — Other Ambulatory Visit: Payer: Self-pay | Admitting: Family Medicine

## 2016-12-12 DIAGNOSIS — I272 Pulmonary hypertension, unspecified: Secondary | ICD-10-CM | POA: Diagnosis not present

## 2016-12-12 DIAGNOSIS — K219 Gastro-esophageal reflux disease without esophagitis: Secondary | ICD-10-CM | POA: Diagnosis not present

## 2016-12-12 DIAGNOSIS — Z7901 Long term (current) use of anticoagulants: Secondary | ICD-10-CM | POA: Diagnosis not present

## 2016-12-12 DIAGNOSIS — R9431 Abnormal electrocardiogram [ECG] [EKG]: Secondary | ICD-10-CM | POA: Diagnosis not present

## 2016-12-12 DIAGNOSIS — Z79890 Hormone replacement therapy (postmenopausal): Secondary | ICD-10-CM | POA: Diagnosis not present

## 2016-12-12 DIAGNOSIS — I495 Sick sinus syndrome: Secondary | ICD-10-CM | POA: Diagnosis not present

## 2016-12-12 DIAGNOSIS — Z9889 Other specified postprocedural states: Secondary | ICD-10-CM | POA: Diagnosis not present

## 2016-12-12 DIAGNOSIS — Z8249 Family history of ischemic heart disease and other diseases of the circulatory system: Secondary | ICD-10-CM | POA: Diagnosis not present

## 2016-12-12 DIAGNOSIS — I4892 Unspecified atrial flutter: Secondary | ICD-10-CM | POA: Diagnosis not present

## 2016-12-12 DIAGNOSIS — I10 Essential (primary) hypertension: Secondary | ICD-10-CM | POA: Diagnosis not present

## 2016-12-12 DIAGNOSIS — Z833 Family history of diabetes mellitus: Secondary | ICD-10-CM | POA: Diagnosis not present

## 2016-12-12 DIAGNOSIS — Z79899 Other long term (current) drug therapy: Secondary | ICD-10-CM | POA: Diagnosis not present

## 2016-12-12 DIAGNOSIS — Z823 Family history of stroke: Secondary | ICD-10-CM | POA: Diagnosis not present

## 2016-12-12 DIAGNOSIS — Z800 Family history of malignant neoplasm of digestive organs: Secondary | ICD-10-CM | POA: Diagnosis not present

## 2016-12-12 DIAGNOSIS — E785 Hyperlipidemia, unspecified: Secondary | ICD-10-CM | POA: Diagnosis not present

## 2016-12-12 DIAGNOSIS — E039 Hypothyroidism, unspecified: Secondary | ICD-10-CM | POA: Diagnosis not present

## 2016-12-12 DIAGNOSIS — I480 Paroxysmal atrial fibrillation: Secondary | ICD-10-CM | POA: Diagnosis not present

## 2016-12-12 DIAGNOSIS — Z888 Allergy status to other drugs, medicaments and biological substances status: Secondary | ICD-10-CM | POA: Diagnosis not present

## 2016-12-12 DIAGNOSIS — I4891 Unspecified atrial fibrillation: Secondary | ICD-10-CM | POA: Diagnosis not present

## 2016-12-12 DIAGNOSIS — Z5181 Encounter for therapeutic drug level monitoring: Secondary | ICD-10-CM | POA: Diagnosis not present

## 2016-12-12 DIAGNOSIS — E119 Type 2 diabetes mellitus without complications: Secondary | ICD-10-CM | POA: Diagnosis not present

## 2016-12-12 DIAGNOSIS — Z950 Presence of cardiac pacemaker: Secondary | ICD-10-CM | POA: Diagnosis not present

## 2016-12-12 DIAGNOSIS — I340 Nonrheumatic mitral (valve) insufficiency: Secondary | ICD-10-CM | POA: Diagnosis not present

## 2016-12-21 ENCOUNTER — Ambulatory Visit (INDEPENDENT_AMBULATORY_CARE_PROVIDER_SITE_OTHER): Payer: Medicare Other | Admitting: Physician Assistant

## 2016-12-21 ENCOUNTER — Encounter: Payer: Self-pay | Admitting: Physician Assistant

## 2016-12-21 ENCOUNTER — Ambulatory Visit (INDEPENDENT_AMBULATORY_CARE_PROVIDER_SITE_OTHER): Payer: Medicare Other

## 2016-12-21 ENCOUNTER — Telehealth: Payer: Self-pay | Admitting: Family Medicine

## 2016-12-21 VITALS — BP 138/85 | HR 86 | Temp 97.3°F | Ht 64.0 in | Wt 167.4 lb

## 2016-12-21 DIAGNOSIS — S2231XA Fracture of one rib, right side, initial encounter for closed fracture: Secondary | ICD-10-CM

## 2016-12-21 DIAGNOSIS — I10 Essential (primary) hypertension: Secondary | ICD-10-CM | POA: Diagnosis not present

## 2016-12-21 DIAGNOSIS — I482 Chronic atrial fibrillation, unspecified: Secondary | ICD-10-CM

## 2016-12-21 DIAGNOSIS — S20211A Contusion of right front wall of thorax, initial encounter: Secondary | ICD-10-CM

## 2016-12-21 NOTE — Patient Instructions (Signed)
Rib Contusion A rib contusion is a deep bruise on your rib area. Contusions are the result of a blunt trauma that causes bleeding and injury to the tissues under the skin. A rib contusion may involve bruising of the ribs and of the skin and muscles in the area. The skin overlying the contusion may turn blue, purple, or yellow. Minor injuries will give you a painless contusion, but more severe contusions may stay painful and swollen for a few weeks. What are the causes? A contusion is usually caused by a blow, trauma, or direct force to an area of the body. This often occurs while playing contact sports. What are the signs or symptoms?  Swelling and redness of the injured area.  Discoloration of the injured area.  Tenderness and soreness of the injured area.  Pain with or without movement. How is this diagnosed? The diagnosis can be made by taking a medical history and performing a physical exam. An X-ray, CT scan, or MRI may be needed to determine if there were any associated injuries, such as broken bones (fractures) or internal injuries. How is this treated? Often, the best treatment for a rib contusion is rest. Icing or applying cold compresses to the injured area may help reduce swelling and inflammation. Deep breathing exercises may be recommended to reduce the risk of partial lung collapse and pneumonia. Over-the-counter or prescription medicines may also be recommended for pain control. Follow these instructions at home:  Apply ice to the injured area: ? Put ice in a plastic bag. ? Place a towel between your skin and the bag. ? Leave the ice on for 20 minutes, 2-3 times per day.  Take medicines only as directed by your health care provider.  Rest the injured area. Avoid strenuous activity and any activities or movements that cause pain. Be careful during activities and avoid bumping the injured area.  Perform deep-breathing exercises as directed by your health care provider.  Do  not lift anything that is heavier than 5 lb (2.3 kg) until your health care provider approves.  Do not use any tobacco products, including cigarettes, chewing tobacco, or electronic cigarettes. If you need help quitting, ask your health care provider. Contact a health care provider if:  You have increased bruising or swelling.  You have pain that is not controlled with treatment.  You have a fever. Get help right away if:  You have difficulty breathing or shortness of breath.  You develop a continual cough, or you cough up thick or bloody sputum.  You feel sick to your stomach (nauseous), you throw up (vomit), or you have abdominal pain. This information is not intended to replace advice given to you by your health care provider. Make sure you discuss any questions you have with your health care provider. Document Released: 03/13/2001 Document Revised: 11/24/2015 Document Reviewed: 03/30/2014 Elsevier Interactive Patient Education  2018 [employer]  

## 2016-12-23 DIAGNOSIS — S2231XA Fracture of one rib, right side, initial encounter for closed fracture: Secondary | ICD-10-CM | POA: Insufficient documentation

## 2016-12-23 NOTE — Progress Notes (Signed)
BP 138/85   Pulse 86   Temp 97.3 F (36.3 C) (Oral)   Ht 5\' 4"  (1.626 m)   Wt 167 lb 6.4 oz (75.9 kg)   BMI 28.73 kg/m    Subjective:    Patient ID: Meredith Anderson, female    DOB: April 18, 1941, 76 y.o.   MRN: [id number]  HPI: Meredith Anderson is a 76 y.o. female presenting on 12/21/2016 for Fall (Patient fell this am and hurt her right side)  Woke up this morning, fell to floor when she was standing at the counter as she was taking her meds.  Sometimes has dizziness upon standing and tried to hold tightly to something. Denies chest pain, SOB, hypoglycemia.  Pain is concentrated on the right rib. Had hospital admit last week for arrthymia, and is on a new medication Tikosyn.  Relevant past medical, surgical, family and social history reviewed and updated as indicated. Allergies and medications reviewed and updated.  Past Medical History:  Diagnosis Date  . A-fib ([place])   . Allergy    seasonal  . Anxiety   . Arthritis   . 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 Cataract   . Dyshydrosis   . Hemorrhoids   . Hyperlipidemia   . Hypertension   . Menopause   . NIDDM (non-insulin dependent diabetes mellitus)    diet controlled   . Osteopenia 2016  . Thyroid disease     Past Surgical History:  Procedure Laterality Date  . ABDOMINAL HYSTERECTOMY     partial  . ABLATION    . bunionectomy bilateral    . EYE SURGERY Bilateral    cataracts  . LUMBAR DISC SURGERY     L4 and L5  . PACEMAKER INSERTION  12/14/2015  . ROTATOR CUFF REPAIR Right   . TUBAL LIGATION      Review of Systems  Constitutional: Negative.  Negative for activity change, fatigue and fever.  HENT: Negative.   Eyes: Negative.   Respiratory: Negative.  Negative for cough, chest tightness and shortness of breath.   Cardiovascular: Positive for chest pain. Negative for palpitations and leg swelling.  Gastrointestinal: Negative.  Negative for abdominal pain.  Endocrine: Negative.   Genitourinary: Negative.   Negative for dysuria.  Musculoskeletal: Positive for myalgias.  Skin: Negative.   Neurological: Negative.     Allergies as of 12/21/2016      Reactions   Amlodipine    Other reaction(s): Other (See Comments) Swollen ankles   Dronedarone Other (See Comments)   Increased BG   Glipizide-metformin Hcl    Other reaction(s): Diarrhea, Other (See Comments) Weakness   Metformin    Other reaction(s): Diarrhea   Niaspan [niacin] Rash      Medication List       Accurate as of 12/21/16 11:59 PM. Always use your most recent med list.          ACCU-CHEK AVIVA PLUS test strip Generic drug:  glucose blood USE TO CHECK BLOOD SUGAR ONCE A DAY OR AS INSTRUCTED   Acetaminophen 500 MG coapsule   bismuth subsalicylate 709 MG chewable tablet Commonly known as:  PEPTO BISMOL as needed. Reported on 07/13/2015   cholecalciferol 1000 units tablet Commonly known as:  VITAMIN D Take 1,000 Units by mouth daily.   diclofenac sodium 1 % Gel Commonly known as:  VOLTAREN Apply 1 to 2 grams to area of pain up to qid as needed   diphenhydrAMINE 25 mg capsule  Commonly known as:  BENADRYL 25 mg as needed.   dofetilide 250 MCG capsule Commonly known as:  TIKOSYN Take by mouth.   escitalopram 10 MG tablet Commonly known as:  LEXAPRO TAKE 1 TABLET DAILY (REPLACES CELEXA)   fexofenadine 180 MG tablet Commonly known as:  ALLEGRA Take 1 tablet (180 mg total) by mouth daily. For allergy symptoms   furosemide 40 MG tablet Commonly known as:  LASIX TAKE 1 TABLET (40 MG TOTAL) BY MOUTH DAILY.   hydrocortisone 2.5 % rectal cream Commonly known as:  PROCTOZONE-HC APPLY TO RECTAL AREA EVERY 6 HOURS AS NEEDED FOR HEMORRHOIDS   KLOR-CON M20 20 MEQ tablet Generic drug:  potassium chloride SA TAKE 1 TABLET (20 MEQ TOTAL) BY MOUTH DAILY.   levothyroxine 150 MCG tablet Commonly known as:  SYNTHROID, LEVOTHROID TAKE 1 TABLET (150 MCG TOTAL) BY MOUTH DAILY BEFORE BREAKFAST.   magnesium oxide 400 MG  tablet Commonly known as:  MAG-OX 400 mg.   pioglitazone 30 MG tablet Commonly known as:  ACTOS TAKE 1 TABLET (30 MG TOTAL) BY MOUTH DAILY.   PREMARIN vaginal cream Generic drug:  conjugated estrogens USING APPLICATOR PLACE 1 GRAM IN VAGINA FOR 3 WEEKS,THEN USE EVERY OTHER NIGHT AS INSTRUCTED   rosuvastatin 20 MG tablet Commonly known as:  CRESTOR TAKE 1 TABLET DAILY   TETRAHYDROZOLINE HCL OP Apply to eye. Reported on 01/02/2016   XARELTO 15 MG Tabs tablet Generic drug:  Rivaroxaban TAKE 1 TABLET (15 MG TOTAL) BY MOUTH DAILY WITH SUPPER.          Objective:    BP 138/85   Pulse 86   Temp 97.3 F (36.3 C) (Oral)   Ht 5\' 4"  (1.626 m)   Wt 167 lb 6.4 oz (75.9 kg)   BMI 28.73 kg/m   Allergies  Allergen Reactions  . Amlodipine     Other reaction(s): Other (See Comments) Swollen ankles  . Dronedarone Other (See Comments)    Increased BG  . Glipizide-Metformin Hcl     Other reaction(s): Diarrhea, Other (See Comments) Weakness  . Metformin     Other reaction(s): Diarrhea  . Niaspan [Niacin] Rash    Physical Exam  Constitutional: She is oriented to person, place, and time. She appears well-developed and well-nourished.  HENT:  Head: Normocephalic and atraumatic.  Right Ear: Tympanic membrane, external ear and ear canal normal.  Left Ear: Tympanic membrane, external ear and ear canal normal.  Nose: Nose normal. No rhinorrhea.  Mouth/Throat: Oropharynx is clear and moist and mucous membranes are normal. No oropharyngeal exudate or posterior oropharyngeal erythema.  Eyes: Conjunctivae and EOM are normal. Pupils are equal, round, and reactive to light.  Neck: Normal range of motion. Neck supple.  Cardiovascular: Normal rate, regular rhythm, normal heart sounds and intact distal pulses.   Pulmonary/Chest: Effort normal and breath sounds normal.  Abdominal: Soft. Bowel sounds are normal.  Musculoskeletal:       Arms: Neurological: She is alert and oriented to person,  place, and time. She has normal reflexes.  Skin: Skin is warm and dry. No rash noted.  Psychiatric: She has a normal mood and affect. Her behavior is normal. Judgment and thought content normal.        Assessment & Plan:   1. Chronic atrial fibrillation (HCC) - dofetilide (TIKOSYN) 250 MCG capsule; Take by mouth.  2. Essential hypertension, benign  3. Rib contusion, right, initial encounter - DG Ribs Unilateral Right; Future  4. Closed fracture of one rib of  right side, initial encounter   Current Outpatient Prescriptions:  .  Acetaminophen 500 MG coapsule, , Disp: , Rfl:  .  bismuth subsalicylate (PEPTO BISMOL) 262 MG chewable tablet, as needed. Reported on 07/13/2015, Disp: , Rfl:  .  cholecalciferol (VITAMIN D) 1000 UNITS tablet, Take 1,000 Units by mouth daily., Disp: , Rfl:  .  diclofenac sodium (VOLTAREN) 1 % GEL, Apply 1 to 2 grams to area of pain up to qid as needed, Disp: 100 g, Rfl: 2 .  diphenhydrAMINE (BENADRYL) 25 mg capsule, 25 mg as needed. , Disp: , Rfl:  .  dofetilide (TIKOSYN) 250 MCG capsule, Take by mouth., Disp: , Rfl:  .  escitalopram (LEXAPRO) 10 MG tablet, TAKE 1 TABLET DAILY (REPLACES CELEXA), Disp: 90 tablet, Rfl: 0 .  fexofenadine (ALLEGRA) 180 MG tablet, Take 1 tablet (180 mg total) by mouth daily. For allergy symptoms, Disp: 30 tablet, Rfl: 11 .  furosemide (LASIX) 40 MG tablet, TAKE 1 TABLET (40 MG TOTAL) BY MOUTH DAILY., Disp: 30 tablet, Rfl: 4 .  hydrocortisone (PROCTOZONE-HC) 2.5 % rectal cream, APPLY TO RECTAL AREA EVERY 6 HOURS AS NEEDED FOR HEMORRHOIDS, Disp: 30 g, Rfl: 2 .  KLOR-CON M20 20 MEQ tablet, TAKE 1 TABLET (20 MEQ TOTAL) BY MOUTH DAILY., Disp: 30 tablet, Rfl: 4 .  levothyroxine (SYNTHROID, LEVOTHROID) 150 MCG tablet, TAKE 1 TABLET (150 MCG TOTAL) BY MOUTH DAILY BEFORE BREAKFAST., Disp: 90 tablet, Rfl: 3 .  magnesium oxide (MAG-OX) 400 MG tablet, 400 mg., Disp: , Rfl:  .  pioglitazone (ACTOS) 30 MG tablet, TAKE 1 TABLET (30 MG TOTAL) BY  MOUTH DAILY., Disp: 90 tablet, Rfl: 3 .  PREMARIN vaginal cream, USING APPLICATOR PLACE 1 GRAM IN VAGINA FOR 3 WEEKS,THEN USE EVERY OTHER NIGHT AS INSTRUCTED, Disp: 30 g, Rfl: 4 .  rosuvastatin (CRESTOR) 20 MG tablet, TAKE 1 TABLET DAILY, Disp: 90 tablet, Rfl: 0 .  TETRAHYDROZOLINE HCL OP, Apply to eye. Reported on 01/02/2016, Disp: , Rfl:  .  XARELTO 15 MG TABS tablet, TAKE 1 TABLET (15 MG TOTAL) BY MOUTH DAILY WITH SUPPER., Disp: 90 tablet, Rfl: 1 .  ACCU-CHEK AVIVA PLUS test strip, USE TO CHECK BLOOD SUGAR ONCE A DAY OR AS INSTRUCTED, Disp: 50 each, Rfl: 2  Continue all other maintenance medications as listed above.  Follow up plan: Return for can change to me as PCP.  Educational handout given for contusion  Terald Sleeper PA-C [facility] [address]  [place], [place] [address] [phone]   12/23/2016, 9:54 PM

## 2016-12-27 DIAGNOSIS — S129XXA Fracture of neck, unspecified, initial encounter: Secondary | ICD-10-CM | POA: Diagnosis not present

## 2016-12-27 DIAGNOSIS — K219 Gastro-esophageal reflux disease without esophagitis: Secondary | ICD-10-CM | POA: Diagnosis not present

## 2016-12-27 DIAGNOSIS — Z950 Presence of cardiac pacemaker: Secondary | ICD-10-CM | POA: Diagnosis not present

## 2016-12-27 DIAGNOSIS — M50221 Other cervical disc displacement at C4-C5 level: Secondary | ICD-10-CM | POA: Diagnosis not present

## 2016-12-27 DIAGNOSIS — S2241XA Multiple fractures of ribs, right side, initial encounter for closed fracture: Secondary | ICD-10-CM | POA: Diagnosis not present

## 2016-12-27 DIAGNOSIS — Y9301 Activity, walking, marching and hiking: Secondary | ICD-10-CM | POA: Diagnosis not present

## 2016-12-27 DIAGNOSIS — R4020 Unspecified coma: Secondary | ICD-10-CM | POA: Diagnosis not present

## 2016-12-27 DIAGNOSIS — Z79890 Hormone replacement therapy (postmenopausal): Secondary | ICD-10-CM | POA: Diagnosis not present

## 2016-12-27 DIAGNOSIS — E039 Hypothyroidism, unspecified: Secondary | ICD-10-CM | POA: Diagnosis not present

## 2016-12-27 DIAGNOSIS — S1093XA Contusion of unspecified part of neck, initial encounter: Secondary | ICD-10-CM | POA: Diagnosis not present

## 2016-12-27 DIAGNOSIS — Z86010 Personal history of colonic polyps: Secondary | ICD-10-CM | POA: Diagnosis not present

## 2016-12-27 DIAGNOSIS — M7989 Other specified soft tissue disorders: Secondary | ICD-10-CM | POA: Diagnosis not present

## 2016-12-27 DIAGNOSIS — M79602 Pain in left arm: Secondary | ICD-10-CM | POA: Diagnosis not present

## 2016-12-27 DIAGNOSIS — M189 Osteoarthritis of first carpometacarpal joint, unspecified: Secondary | ICD-10-CM | POA: Diagnosis not present

## 2016-12-27 DIAGNOSIS — S134XXA Sprain of ligaments of cervical spine, initial encounter: Secondary | ICD-10-CM | POA: Diagnosis not present

## 2016-12-27 DIAGNOSIS — R296 Repeated falls: Secondary | ICD-10-CM | POA: Diagnosis not present

## 2016-12-27 DIAGNOSIS — R9431 Abnormal electrocardiogram [ECG] [EKG]: Secondary | ICD-10-CM | POA: Diagnosis not present

## 2016-12-27 DIAGNOSIS — M545 Low back pain: Secondary | ICD-10-CM | POA: Diagnosis not present

## 2016-12-27 DIAGNOSIS — W19XXXA Unspecified fall, initial encounter: Secondary | ICD-10-CM | POA: Diagnosis not present

## 2016-12-27 DIAGNOSIS — I272 Pulmonary hypertension, unspecified: Secondary | ICD-10-CM | POA: Diagnosis not present

## 2016-12-27 DIAGNOSIS — D6489 Other specified anemias: Secondary | ICD-10-CM | POA: Diagnosis not present

## 2016-12-27 DIAGNOSIS — Z888 Allergy status to other drugs, medicaments and biological substances status: Secondary | ICD-10-CM | POA: Diagnosis not present

## 2016-12-27 DIAGNOSIS — E7800 Pure hypercholesterolemia, unspecified: Secondary | ICD-10-CM | POA: Diagnosis not present

## 2016-12-27 DIAGNOSIS — S12300A Unspecified displaced fracture of fourth cervical vertebra, initial encounter for closed fracture: Secondary | ICD-10-CM | POA: Diagnosis not present

## 2016-12-27 DIAGNOSIS — M542 Cervicalgia: Secondary | ICD-10-CM | POA: Diagnosis not present

## 2016-12-27 DIAGNOSIS — Z79899 Other long term (current) drug therapy: Secondary | ICD-10-CM | POA: Diagnosis not present

## 2016-12-27 DIAGNOSIS — I4891 Unspecified atrial fibrillation: Secondary | ICD-10-CM | POA: Diagnosis not present

## 2016-12-27 DIAGNOSIS — R079 Chest pain, unspecified: Secondary | ICD-10-CM | POA: Diagnosis not present

## 2016-12-27 DIAGNOSIS — S12390A Other displaced fracture of fourth cervical vertebra, initial encounter for closed fracture: Secondary | ICD-10-CM | POA: Diagnosis not present

## 2016-12-27 DIAGNOSIS — I480 Paroxysmal atrial fibrillation: Secondary | ICD-10-CM | POA: Diagnosis not present

## 2016-12-27 DIAGNOSIS — R0789 Other chest pain: Secondary | ICD-10-CM | POA: Diagnosis not present

## 2016-12-27 DIAGNOSIS — R0781 Pleurodynia: Secondary | ICD-10-CM | POA: Diagnosis not present

## 2016-12-27 DIAGNOSIS — R55 Syncope and collapse: Secondary | ICD-10-CM | POA: Diagnosis not present

## 2016-12-27 DIAGNOSIS — S1083XA Contusion of other specified part of neck, initial encounter: Secondary | ICD-10-CM | POA: Diagnosis not present

## 2016-12-27 DIAGNOSIS — Z791 Long term (current) use of non-steroidal anti-inflammatories (NSAID): Secondary | ICD-10-CM | POA: Diagnosis not present

## 2016-12-27 DIAGNOSIS — S0990XA Unspecified injury of head, initial encounter: Secondary | ICD-10-CM | POA: Diagnosis not present

## 2016-12-27 DIAGNOSIS — I10 Essential (primary) hypertension: Secondary | ICD-10-CM | POA: Diagnosis not present

## 2016-12-27 DIAGNOSIS — M5090 Cervical disc disorder, unspecified, unspecified cervical region: Secondary | ICD-10-CM | POA: Diagnosis not present

## 2016-12-27 DIAGNOSIS — E119 Type 2 diabetes mellitus without complications: Secondary | ICD-10-CM | POA: Diagnosis not present

## 2016-12-27 DIAGNOSIS — W1830XA Fall on same level, unspecified, initial encounter: Secondary | ICD-10-CM | POA: Diagnosis not present

## 2016-12-27 DIAGNOSIS — M85832 Other specified disorders of bone density and structure, left forearm: Secondary | ICD-10-CM | POA: Diagnosis not present

## 2016-12-27 DIAGNOSIS — M4802 Spinal stenosis, cervical region: Secondary | ICD-10-CM | POA: Diagnosis not present

## 2016-12-27 DIAGNOSIS — S022XXA Fracture of nasal bones, initial encounter for closed fracture: Secondary | ICD-10-CM | POA: Diagnosis not present

## 2016-12-27 DIAGNOSIS — Z7901 Long term (current) use of anticoagulants: Secondary | ICD-10-CM | POA: Diagnosis not present

## 2016-12-27 DIAGNOSIS — R002 Palpitations: Secondary | ICD-10-CM | POA: Diagnosis not present

## 2016-12-27 DIAGNOSIS — Z043 Encounter for examination and observation following other accident: Secondary | ICD-10-CM | POA: Diagnosis not present

## 2016-12-27 DIAGNOSIS — E785 Hyperlipidemia, unspecified: Secondary | ICD-10-CM | POA: Diagnosis not present

## 2016-12-28 ENCOUNTER — Ambulatory Visit: Payer: Medicare Other | Admitting: Family Medicine

## 2016-12-31 ENCOUNTER — Ambulatory Visit: Payer: Medicare Other | Admitting: Physician Assistant

## 2017-01-04 ENCOUNTER — Other Ambulatory Visit: Payer: Self-pay | Admitting: Family Medicine

## 2017-01-04 DIAGNOSIS — M50221 Other cervical disc displacement at C4-C5 level: Secondary | ICD-10-CM | POA: Diagnosis not present

## 2017-01-04 DIAGNOSIS — S12390D Other displaced fracture of fourth cervical vertebra, subsequent encounter for fracture with routine healing: Secondary | ICD-10-CM | POA: Diagnosis not present

## 2017-01-04 DIAGNOSIS — I495 Sick sinus syndrome: Secondary | ICD-10-CM | POA: Diagnosis not present

## 2017-01-04 DIAGNOSIS — I4891 Unspecified atrial fibrillation: Secondary | ICD-10-CM | POA: Diagnosis not present

## 2017-01-04 DIAGNOSIS — I051 Rheumatic mitral insufficiency: Secondary | ICD-10-CM | POA: Diagnosis not present

## 2017-01-04 DIAGNOSIS — S022XXD Fracture of nasal bones, subsequent encounter for fracture with routine healing: Secondary | ICD-10-CM | POA: Diagnosis not present

## 2017-01-04 DIAGNOSIS — S2241XD Multiple fractures of ribs, right side, subsequent encounter for fracture with routine healing: Secondary | ICD-10-CM | POA: Diagnosis not present

## 2017-01-04 DIAGNOSIS — E119 Type 2 diabetes mellitus without complications: Secondary | ICD-10-CM | POA: Diagnosis not present

## 2017-01-04 DIAGNOSIS — Z950 Presence of cardiac pacemaker: Secondary | ICD-10-CM | POA: Diagnosis not present

## 2017-01-04 DIAGNOSIS — Z9181 History of falling: Secondary | ICD-10-CM | POA: Diagnosis not present

## 2017-01-04 DIAGNOSIS — W1811XD Fall from or off toilet without subsequent striking against object, subsequent encounter: Secondary | ICD-10-CM | POA: Diagnosis not present

## 2017-01-04 DIAGNOSIS — I10 Essential (primary) hypertension: Secondary | ICD-10-CM | POA: Diagnosis not present

## 2017-01-08 ENCOUNTER — Other Ambulatory Visit: Payer: Self-pay | Admitting: *Deleted

## 2017-01-08 DIAGNOSIS — M50221 Other cervical disc displacement at C4-C5 level: Secondary | ICD-10-CM | POA: Diagnosis not present

## 2017-01-08 DIAGNOSIS — I495 Sick sinus syndrome: Secondary | ICD-10-CM | POA: Diagnosis not present

## 2017-01-08 DIAGNOSIS — I10 Essential (primary) hypertension: Secondary | ICD-10-CM | POA: Diagnosis not present

## 2017-01-08 DIAGNOSIS — I051 Rheumatic mitral insufficiency: Secondary | ICD-10-CM | POA: Diagnosis not present

## 2017-01-08 DIAGNOSIS — I4891 Unspecified atrial fibrillation: Secondary | ICD-10-CM | POA: Diagnosis not present

## 2017-01-08 DIAGNOSIS — W1811XD Fall from or off toilet without subsequent striking against object, subsequent encounter: Secondary | ICD-10-CM | POA: Diagnosis not present

## 2017-01-08 DIAGNOSIS — S022XXD Fracture of nasal bones, subsequent encounter for fracture with routine healing: Secondary | ICD-10-CM | POA: Diagnosis not present

## 2017-01-08 DIAGNOSIS — E119 Type 2 diabetes mellitus without complications: Secondary | ICD-10-CM | POA: Diagnosis not present

## 2017-01-08 DIAGNOSIS — Z950 Presence of cardiac pacemaker: Secondary | ICD-10-CM | POA: Diagnosis not present

## 2017-01-08 DIAGNOSIS — S2241XD Multiple fractures of ribs, right side, subsequent encounter for fracture with routine healing: Secondary | ICD-10-CM | POA: Diagnosis not present

## 2017-01-08 DIAGNOSIS — Z9181 History of falling: Secondary | ICD-10-CM | POA: Diagnosis not present

## 2017-01-08 DIAGNOSIS — S12390D Other displaced fracture of fourth cervical vertebra, subsequent encounter for fracture with routine healing: Secondary | ICD-10-CM | POA: Diagnosis not present

## 2017-01-08 NOTE — Patient Outreach (Signed)
[facility]) Care Management  01/08/2017  Meredith Anderson Mar 15, 1941 [id number]  Referral via [facility] List: Recent discharge from [facility] 01/02/2017  Telephone call to patient; no answer, unable to leave message.  Plan: Will follow up.  Sherrin Daisy, RN BSN [place] Management Coordinator [facility] Care Management  [phone]

## 2017-01-09 ENCOUNTER — Telehealth: Payer: Self-pay | Admitting: *Deleted

## 2017-01-09 ENCOUNTER — Other Ambulatory Visit: Payer: Self-pay | Admitting: *Deleted

## 2017-01-09 DIAGNOSIS — E119 Type 2 diabetes mellitus without complications: Secondary | ICD-10-CM | POA: Diagnosis not present

## 2017-01-09 DIAGNOSIS — M50221 Other cervical disc displacement at C4-C5 level: Secondary | ICD-10-CM | POA: Diagnosis not present

## 2017-01-09 DIAGNOSIS — I10 Essential (primary) hypertension: Secondary | ICD-10-CM | POA: Diagnosis not present

## 2017-01-09 DIAGNOSIS — I4891 Unspecified atrial fibrillation: Secondary | ICD-10-CM | POA: Diagnosis not present

## 2017-01-09 DIAGNOSIS — Z9181 History of falling: Secondary | ICD-10-CM | POA: Diagnosis not present

## 2017-01-09 DIAGNOSIS — S12390D Other displaced fracture of fourth cervical vertebra, subsequent encounter for fracture with routine healing: Secondary | ICD-10-CM | POA: Diagnosis not present

## 2017-01-09 DIAGNOSIS — W1811XD Fall from or off toilet without subsequent striking against object, subsequent encounter: Secondary | ICD-10-CM | POA: Diagnosis not present

## 2017-01-09 DIAGNOSIS — I495 Sick sinus syndrome: Secondary | ICD-10-CM | POA: Diagnosis not present

## 2017-01-09 DIAGNOSIS — I051 Rheumatic mitral insufficiency: Secondary | ICD-10-CM | POA: Diagnosis not present

## 2017-01-09 DIAGNOSIS — Z950 Presence of cardiac pacemaker: Secondary | ICD-10-CM | POA: Diagnosis not present

## 2017-01-09 DIAGNOSIS — S2241XD Multiple fractures of ribs, right side, subsequent encounter for fracture with routine healing: Secondary | ICD-10-CM | POA: Diagnosis not present

## 2017-01-09 DIAGNOSIS — S022XXD Fracture of nasal bones, subsequent encounter for fracture with routine healing: Secondary | ICD-10-CM | POA: Diagnosis not present

## 2017-01-09 NOTE — Telephone Encounter (Signed)
Per Fleet Contras with [facility], pt has had numerous falls Pt today has elevated heart rate per [facility] PT of [facility] wants pt to come here for EKG Order in [place] Fax EKG to [place]

## 2017-01-09 NOTE — Patient Outreach (Signed)
[place] [facility]) Care Management  01/09/2017  Meredith Anderson 1940/07/22 [id number]  Telephone call attempt x 2 ; left HIPPA compliant voice mail requesting return call.   Plan: Will follow up.  Sherrin Daisy, RN BSN [place] Management Coordinator [facility] Care Management  [phone]

## 2017-01-10 DIAGNOSIS — S2241XA Multiple fractures of ribs, right side, initial encounter for closed fracture: Secondary | ICD-10-CM | POA: Diagnosis not present

## 2017-01-10 DIAGNOSIS — E119 Type 2 diabetes mellitus without complications: Secondary | ICD-10-CM | POA: Diagnosis not present

## 2017-01-10 DIAGNOSIS — Z8249 Family history of ischemic heart disease and other diseases of the circulatory system: Secondary | ICD-10-CM | POA: Diagnosis not present

## 2017-01-10 DIAGNOSIS — Z79890 Hormone replacement therapy (postmenopausal): Secondary | ICD-10-CM | POA: Diagnosis not present

## 2017-01-10 DIAGNOSIS — S022XXA Fracture of nasal bones, initial encounter for closed fracture: Secondary | ICD-10-CM | POA: Diagnosis not present

## 2017-01-10 DIAGNOSIS — E785 Hyperlipidemia, unspecified: Secondary | ICD-10-CM | POA: Diagnosis not present

## 2017-01-10 DIAGNOSIS — I272 Pulmonary hypertension, unspecified: Secondary | ICD-10-CM | POA: Diagnosis not present

## 2017-01-10 DIAGNOSIS — Z888 Allergy status to other drugs, medicaments and biological substances status: Secondary | ICD-10-CM | POA: Diagnosis not present

## 2017-01-10 DIAGNOSIS — R0600 Dyspnea, unspecified: Secondary | ICD-10-CM | POA: Diagnosis not present

## 2017-01-10 DIAGNOSIS — Z7901 Long term (current) use of anticoagulants: Secondary | ICD-10-CM | POA: Diagnosis not present

## 2017-01-10 DIAGNOSIS — S12100A Unspecified displaced fracture of second cervical vertebra, initial encounter for closed fracture: Secondary | ICD-10-CM | POA: Diagnosis not present

## 2017-01-10 DIAGNOSIS — S12390A Other displaced fracture of fourth cervical vertebra, initial encounter for closed fracture: Secondary | ICD-10-CM | POA: Diagnosis not present

## 2017-01-10 DIAGNOSIS — R0602 Shortness of breath: Secondary | ICD-10-CM | POA: Diagnosis not present

## 2017-01-10 DIAGNOSIS — Z79899 Other long term (current) drug therapy: Secondary | ICD-10-CM | POA: Diagnosis not present

## 2017-01-10 DIAGNOSIS — I4891 Unspecified atrial fibrillation: Secondary | ICD-10-CM | POA: Diagnosis not present

## 2017-01-10 DIAGNOSIS — I480 Paroxysmal atrial fibrillation: Secondary | ICD-10-CM | POA: Diagnosis not present

## 2017-01-10 DIAGNOSIS — Z833 Family history of diabetes mellitus: Secondary | ICD-10-CM | POA: Diagnosis not present

## 2017-01-10 DIAGNOSIS — Z950 Presence of cardiac pacemaker: Secondary | ICD-10-CM | POA: Diagnosis not present

## 2017-01-10 DIAGNOSIS — Z7984 Long term (current) use of oral hypoglycemic drugs: Secondary | ICD-10-CM | POA: Diagnosis not present

## 2017-01-10 DIAGNOSIS — K219 Gastro-esophageal reflux disease without esophagitis: Secondary | ICD-10-CM | POA: Diagnosis not present

## 2017-01-10 DIAGNOSIS — I471 Supraventricular tachycardia: Secondary | ICD-10-CM | POA: Diagnosis not present

## 2017-01-10 DIAGNOSIS — Z800 Family history of malignant neoplasm of digestive organs: Secondary | ICD-10-CM | POA: Diagnosis not present

## 2017-01-10 DIAGNOSIS — I340 Nonrheumatic mitral (valve) insufficiency: Secondary | ICD-10-CM | POA: Diagnosis not present

## 2017-01-10 DIAGNOSIS — Z823 Family history of stroke: Secondary | ICD-10-CM | POA: Diagnosis not present

## 2017-01-10 DIAGNOSIS — I10 Essential (primary) hypertension: Secondary | ICD-10-CM | POA: Diagnosis not present

## 2017-01-10 DIAGNOSIS — I495 Sick sinus syndrome: Secondary | ICD-10-CM | POA: Diagnosis not present

## 2017-01-10 DIAGNOSIS — I4892 Unspecified atrial flutter: Secondary | ICD-10-CM | POA: Diagnosis not present

## 2017-01-10 DIAGNOSIS — Z9181 History of falling: Secondary | ICD-10-CM | POA: Diagnosis not present

## 2017-01-10 DIAGNOSIS — Z791 Long term (current) use of non-steroidal anti-inflammatories (NSAID): Secondary | ICD-10-CM | POA: Diagnosis not present

## 2017-01-10 DIAGNOSIS — E7800 Pure hypercholesterolemia, unspecified: Secondary | ICD-10-CM | POA: Diagnosis not present

## 2017-01-10 DIAGNOSIS — E039 Hypothyroidism, unspecified: Secondary | ICD-10-CM | POA: Diagnosis not present

## 2017-01-10 DIAGNOSIS — I4581 Long QT syndrome: Secondary | ICD-10-CM | POA: Diagnosis not present

## 2017-01-11 ENCOUNTER — Other Ambulatory Visit: Payer: Self-pay | Admitting: *Deleted

## 2017-01-11 ENCOUNTER — Encounter: Payer: Self-pay | Admitting: *Deleted

## 2017-01-11 NOTE — Patient Outreach (Signed)
[place] [facility]) Care Management  01/11/2017  KEYLA MILONE 1941-02-18 [id number]  Telephone call attempt x 3; left message requesting return call.  Plan: Geophysicist/field seismologist. Will follow up.  Sherrin Daisy, RN BSN [place] Management Coordinator [facility] Care Management  [phone]

## 2017-01-13 DIAGNOSIS — I447 Left bundle-branch block, unspecified: Secondary | ICD-10-CM | POA: Diagnosis not present

## 2017-01-13 DIAGNOSIS — I4892 Unspecified atrial flutter: Secondary | ICD-10-CM | POA: Diagnosis not present

## 2017-01-14 ENCOUNTER — Ambulatory Visit: Payer: Self-pay | Admitting: *Deleted

## 2017-01-18 ENCOUNTER — Ambulatory Visit: Payer: Medicare Other | Admitting: Family Medicine

## 2017-01-22 DIAGNOSIS — I4891 Unspecified atrial fibrillation: Secondary | ICD-10-CM | POA: Diagnosis not present

## 2017-01-22 DIAGNOSIS — E119 Type 2 diabetes mellitus without complications: Secondary | ICD-10-CM | POA: Diagnosis not present

## 2017-01-22 DIAGNOSIS — I051 Rheumatic mitral insufficiency: Secondary | ICD-10-CM | POA: Diagnosis not present

## 2017-01-22 DIAGNOSIS — Z950 Presence of cardiac pacemaker: Secondary | ICD-10-CM | POA: Diagnosis not present

## 2017-01-22 DIAGNOSIS — S2241XD Multiple fractures of ribs, right side, subsequent encounter for fracture with routine healing: Secondary | ICD-10-CM | POA: Diagnosis not present

## 2017-01-22 DIAGNOSIS — S022XXD Fracture of nasal bones, subsequent encounter for fracture with routine healing: Secondary | ICD-10-CM | POA: Diagnosis not present

## 2017-01-22 DIAGNOSIS — Z9181 History of falling: Secondary | ICD-10-CM | POA: Diagnosis not present

## 2017-01-22 DIAGNOSIS — I10 Essential (primary) hypertension: Secondary | ICD-10-CM | POA: Diagnosis not present

## 2017-01-22 DIAGNOSIS — W1811XD Fall from or off toilet without subsequent striking against object, subsequent encounter: Secondary | ICD-10-CM | POA: Diagnosis not present

## 2017-01-22 DIAGNOSIS — M50221 Other cervical disc displacement at C4-C5 level: Secondary | ICD-10-CM | POA: Diagnosis not present

## 2017-01-22 DIAGNOSIS — S12390D Other displaced fracture of fourth cervical vertebra, subsequent encounter for fracture with routine healing: Secondary | ICD-10-CM | POA: Diagnosis not present

## 2017-01-22 DIAGNOSIS — I495 Sick sinus syndrome: Secondary | ICD-10-CM | POA: Diagnosis not present

## 2017-01-24 DIAGNOSIS — I051 Rheumatic mitral insufficiency: Secondary | ICD-10-CM | POA: Diagnosis not present

## 2017-01-24 DIAGNOSIS — I4891 Unspecified atrial fibrillation: Secondary | ICD-10-CM | POA: Diagnosis not present

## 2017-01-24 DIAGNOSIS — S022XXD Fracture of nasal bones, subsequent encounter for fracture with routine healing: Secondary | ICD-10-CM | POA: Diagnosis not present

## 2017-01-24 DIAGNOSIS — Z9181 History of falling: Secondary | ICD-10-CM | POA: Diagnosis not present

## 2017-01-24 DIAGNOSIS — I495 Sick sinus syndrome: Secondary | ICD-10-CM | POA: Diagnosis not present

## 2017-01-24 DIAGNOSIS — S2241XD Multiple fractures of ribs, right side, subsequent encounter for fracture with routine healing: Secondary | ICD-10-CM | POA: Diagnosis not present

## 2017-01-24 DIAGNOSIS — I10 Essential (primary) hypertension: Secondary | ICD-10-CM | POA: Diagnosis not present

## 2017-01-24 DIAGNOSIS — M50221 Other cervical disc displacement at C4-C5 level: Secondary | ICD-10-CM | POA: Diagnosis not present

## 2017-01-24 DIAGNOSIS — S12390D Other displaced fracture of fourth cervical vertebra, subsequent encounter for fracture with routine healing: Secondary | ICD-10-CM | POA: Diagnosis not present

## 2017-01-24 DIAGNOSIS — Z950 Presence of cardiac pacemaker: Secondary | ICD-10-CM | POA: Diagnosis not present

## 2017-01-24 DIAGNOSIS — W1811XD Fall from or off toilet without subsequent striking against object, subsequent encounter: Secondary | ICD-10-CM | POA: Diagnosis not present

## 2017-01-24 DIAGNOSIS — E119 Type 2 diabetes mellitus without complications: Secondary | ICD-10-CM | POA: Diagnosis not present

## 2017-01-28 DIAGNOSIS — E119 Type 2 diabetes mellitus without complications: Secondary | ICD-10-CM | POA: Diagnosis not present

## 2017-01-28 DIAGNOSIS — I4891 Unspecified atrial fibrillation: Secondary | ICD-10-CM | POA: Diagnosis not present

## 2017-01-28 DIAGNOSIS — I10 Essential (primary) hypertension: Secondary | ICD-10-CM | POA: Diagnosis not present

## 2017-01-28 DIAGNOSIS — S12390D Other displaced fracture of fourth cervical vertebra, subsequent encounter for fracture with routine healing: Secondary | ICD-10-CM | POA: Diagnosis not present

## 2017-01-28 DIAGNOSIS — Z950 Presence of cardiac pacemaker: Secondary | ICD-10-CM | POA: Diagnosis not present

## 2017-01-28 DIAGNOSIS — W1811XD Fall from or off toilet without subsequent striking against object, subsequent encounter: Secondary | ICD-10-CM | POA: Diagnosis not present

## 2017-01-28 DIAGNOSIS — Z9181 History of falling: Secondary | ICD-10-CM | POA: Diagnosis not present

## 2017-01-28 DIAGNOSIS — S022XXD Fracture of nasal bones, subsequent encounter for fracture with routine healing: Secondary | ICD-10-CM | POA: Diagnosis not present

## 2017-01-28 DIAGNOSIS — S2241XD Multiple fractures of ribs, right side, subsequent encounter for fracture with routine healing: Secondary | ICD-10-CM | POA: Diagnosis not present

## 2017-01-28 DIAGNOSIS — I051 Rheumatic mitral insufficiency: Secondary | ICD-10-CM | POA: Diagnosis not present

## 2017-01-28 DIAGNOSIS — M50221 Other cervical disc displacement at C4-C5 level: Secondary | ICD-10-CM | POA: Diagnosis not present

## 2017-01-28 DIAGNOSIS — I495 Sick sinus syndrome: Secondary | ICD-10-CM | POA: Diagnosis not present

## 2017-01-29 DIAGNOSIS — M4802 Spinal stenosis, cervical region: Secondary | ICD-10-CM

## 2017-01-29 DIAGNOSIS — G992 Myelopathy in diseases classified elsewhere: Secondary | ICD-10-CM | POA: Insufficient documentation

## 2017-01-29 DIAGNOSIS — M4712 Other spondylosis with myelopathy, cervical region: Secondary | ICD-10-CM | POA: Diagnosis not present

## 2017-01-29 DIAGNOSIS — S12300D Unspecified displaced fracture of fourth cervical vertebra, subsequent encounter for fracture with routine healing: Secondary | ICD-10-CM | POA: Diagnosis not present

## 2017-01-31 DIAGNOSIS — Z950 Presence of cardiac pacemaker: Secondary | ICD-10-CM | POA: Diagnosis not present

## 2017-01-31 DIAGNOSIS — I495 Sick sinus syndrome: Secondary | ICD-10-CM | POA: Diagnosis not present

## 2017-01-31 DIAGNOSIS — E119 Type 2 diabetes mellitus without complications: Secondary | ICD-10-CM | POA: Diagnosis not present

## 2017-01-31 DIAGNOSIS — M50221 Other cervical disc displacement at C4-C5 level: Secondary | ICD-10-CM | POA: Diagnosis not present

## 2017-01-31 DIAGNOSIS — S12390D Other displaced fracture of fourth cervical vertebra, subsequent encounter for fracture with routine healing: Secondary | ICD-10-CM | POA: Diagnosis not present

## 2017-01-31 DIAGNOSIS — Z9181 History of falling: Secondary | ICD-10-CM | POA: Diagnosis not present

## 2017-01-31 DIAGNOSIS — S022XXD Fracture of nasal bones, subsequent encounter for fracture with routine healing: Secondary | ICD-10-CM | POA: Diagnosis not present

## 2017-01-31 DIAGNOSIS — S2241XD Multiple fractures of ribs, right side, subsequent encounter for fracture with routine healing: Secondary | ICD-10-CM | POA: Diagnosis not present

## 2017-01-31 DIAGNOSIS — I051 Rheumatic mitral insufficiency: Secondary | ICD-10-CM | POA: Diagnosis not present

## 2017-01-31 DIAGNOSIS — I4891 Unspecified atrial fibrillation: Secondary | ICD-10-CM | POA: Diagnosis not present

## 2017-01-31 DIAGNOSIS — W1811XD Fall from or off toilet without subsequent striking against object, subsequent encounter: Secondary | ICD-10-CM | POA: Diagnosis not present

## 2017-01-31 DIAGNOSIS — I10 Essential (primary) hypertension: Secondary | ICD-10-CM | POA: Diagnosis not present

## 2017-02-05 DIAGNOSIS — I495 Sick sinus syndrome: Secondary | ICD-10-CM | POA: Diagnosis not present

## 2017-02-05 DIAGNOSIS — I4891 Unspecified atrial fibrillation: Secondary | ICD-10-CM | POA: Diagnosis not present

## 2017-02-05 DIAGNOSIS — Z950 Presence of cardiac pacemaker: Secondary | ICD-10-CM | POA: Diagnosis not present

## 2017-02-05 DIAGNOSIS — I10 Essential (primary) hypertension: Secondary | ICD-10-CM | POA: Diagnosis not present

## 2017-02-05 DIAGNOSIS — I051 Rheumatic mitral insufficiency: Secondary | ICD-10-CM | POA: Diagnosis not present

## 2017-02-05 DIAGNOSIS — S12390D Other displaced fracture of fourth cervical vertebra, subsequent encounter for fracture with routine healing: Secondary | ICD-10-CM | POA: Diagnosis not present

## 2017-02-05 DIAGNOSIS — S022XXD Fracture of nasal bones, subsequent encounter for fracture with routine healing: Secondary | ICD-10-CM | POA: Diagnosis not present

## 2017-02-05 DIAGNOSIS — E119 Type 2 diabetes mellitus without complications: Secondary | ICD-10-CM | POA: Diagnosis not present

## 2017-02-05 DIAGNOSIS — S2241XD Multiple fractures of ribs, right side, subsequent encounter for fracture with routine healing: Secondary | ICD-10-CM | POA: Diagnosis not present

## 2017-02-05 DIAGNOSIS — W1811XD Fall from or off toilet without subsequent striking against object, subsequent encounter: Secondary | ICD-10-CM | POA: Diagnosis not present

## 2017-02-05 DIAGNOSIS — M50221 Other cervical disc displacement at C4-C5 level: Secondary | ICD-10-CM | POA: Diagnosis not present

## 2017-02-05 DIAGNOSIS — Z9181 History of falling: Secondary | ICD-10-CM | POA: Diagnosis not present

## 2017-02-08 ENCOUNTER — Other Ambulatory Visit: Payer: Self-pay | Admitting: Family Medicine

## 2017-02-08 DIAGNOSIS — M50221 Other cervical disc displacement at C4-C5 level: Secondary | ICD-10-CM | POA: Diagnosis not present

## 2017-02-08 DIAGNOSIS — I495 Sick sinus syndrome: Secondary | ICD-10-CM | POA: Diagnosis not present

## 2017-02-08 DIAGNOSIS — I4891 Unspecified atrial fibrillation: Secondary | ICD-10-CM | POA: Diagnosis not present

## 2017-02-08 DIAGNOSIS — S12390D Other displaced fracture of fourth cervical vertebra, subsequent encounter for fracture with routine healing: Secondary | ICD-10-CM | POA: Diagnosis not present

## 2017-02-08 DIAGNOSIS — I10 Essential (primary) hypertension: Secondary | ICD-10-CM | POA: Diagnosis not present

## 2017-02-08 DIAGNOSIS — Z950 Presence of cardiac pacemaker: Secondary | ICD-10-CM | POA: Diagnosis not present

## 2017-02-08 DIAGNOSIS — S2241XD Multiple fractures of ribs, right side, subsequent encounter for fracture with routine healing: Secondary | ICD-10-CM | POA: Diagnosis not present

## 2017-02-08 DIAGNOSIS — E119 Type 2 diabetes mellitus without complications: Secondary | ICD-10-CM | POA: Diagnosis not present

## 2017-02-08 DIAGNOSIS — W1811XD Fall from or off toilet without subsequent striking against object, subsequent encounter: Secondary | ICD-10-CM | POA: Diagnosis not present

## 2017-02-08 DIAGNOSIS — Z9181 History of falling: Secondary | ICD-10-CM | POA: Diagnosis not present

## 2017-02-08 DIAGNOSIS — S022XXD Fracture of nasal bones, subsequent encounter for fracture with routine healing: Secondary | ICD-10-CM | POA: Diagnosis not present

## 2017-02-08 DIAGNOSIS — I051 Rheumatic mitral insufficiency: Secondary | ICD-10-CM | POA: Diagnosis not present

## 2017-02-11 ENCOUNTER — Other Ambulatory Visit: Payer: Self-pay | Admitting: *Deleted

## 2017-02-11 ENCOUNTER — Encounter: Payer: Self-pay | Admitting: *Deleted

## 2017-02-11 DIAGNOSIS — I495 Sick sinus syndrome: Secondary | ICD-10-CM | POA: Diagnosis not present

## 2017-02-11 DIAGNOSIS — S12390D Other displaced fracture of fourth cervical vertebra, subsequent encounter for fracture with routine healing: Secondary | ICD-10-CM | POA: Diagnosis not present

## 2017-02-11 DIAGNOSIS — S2241XD Multiple fractures of ribs, right side, subsequent encounter for fracture with routine healing: Secondary | ICD-10-CM | POA: Diagnosis not present

## 2017-02-11 DIAGNOSIS — Z950 Presence of cardiac pacemaker: Secondary | ICD-10-CM | POA: Diagnosis not present

## 2017-02-11 DIAGNOSIS — M50221 Other cervical disc displacement at C4-C5 level: Secondary | ICD-10-CM | POA: Diagnosis not present

## 2017-02-11 DIAGNOSIS — E119 Type 2 diabetes mellitus without complications: Secondary | ICD-10-CM | POA: Diagnosis not present

## 2017-02-11 DIAGNOSIS — I051 Rheumatic mitral insufficiency: Secondary | ICD-10-CM | POA: Diagnosis not present

## 2017-02-11 DIAGNOSIS — S022XXD Fracture of nasal bones, subsequent encounter for fracture with routine healing: Secondary | ICD-10-CM | POA: Diagnosis not present

## 2017-02-11 DIAGNOSIS — W1811XD Fall from or off toilet without subsequent striking against object, subsequent encounter: Secondary | ICD-10-CM | POA: Diagnosis not present

## 2017-02-11 DIAGNOSIS — I4891 Unspecified atrial fibrillation: Secondary | ICD-10-CM | POA: Diagnosis not present

## 2017-02-11 DIAGNOSIS — Z9181 History of falling: Secondary | ICD-10-CM | POA: Diagnosis not present

## 2017-02-11 DIAGNOSIS — I10 Essential (primary) hypertension: Secondary | ICD-10-CM | POA: Diagnosis not present

## 2017-02-11 NOTE — Patient Outreach (Signed)
[place] [facility]) Care Management  02/11/2017  Meredith Anderson 01/01/1941 [id number]  Unsuccessful call attempts x 3. No response to outreach letter.  Plan: Will sent to care management assistant to close case. Sent MD closure letter.   Sherrin Daisy, RN BSN [place] Management Coordinator [facility] Care Management  [phone]

## 2017-02-13 DIAGNOSIS — W1811XD Fall from or off toilet without subsequent striking against object, subsequent encounter: Secondary | ICD-10-CM | POA: Diagnosis not present

## 2017-02-13 DIAGNOSIS — I495 Sick sinus syndrome: Secondary | ICD-10-CM | POA: Diagnosis not present

## 2017-02-13 DIAGNOSIS — I051 Rheumatic mitral insufficiency: Secondary | ICD-10-CM | POA: Diagnosis not present

## 2017-02-13 DIAGNOSIS — E119 Type 2 diabetes mellitus without complications: Secondary | ICD-10-CM | POA: Diagnosis not present

## 2017-02-13 DIAGNOSIS — S2241XD Multiple fractures of ribs, right side, subsequent encounter for fracture with routine healing: Secondary | ICD-10-CM | POA: Diagnosis not present

## 2017-02-13 DIAGNOSIS — M50221 Other cervical disc displacement at C4-C5 level: Secondary | ICD-10-CM | POA: Diagnosis not present

## 2017-02-13 DIAGNOSIS — Z950 Presence of cardiac pacemaker: Secondary | ICD-10-CM | POA: Diagnosis not present

## 2017-02-13 DIAGNOSIS — S12390D Other displaced fracture of fourth cervical vertebra, subsequent encounter for fracture with routine healing: Secondary | ICD-10-CM | POA: Diagnosis not present

## 2017-02-13 DIAGNOSIS — I10 Essential (primary) hypertension: Secondary | ICD-10-CM | POA: Diagnosis not present

## 2017-02-13 DIAGNOSIS — I4891 Unspecified atrial fibrillation: Secondary | ICD-10-CM | POA: Diagnosis not present

## 2017-02-13 DIAGNOSIS — S022XXD Fracture of nasal bones, subsequent encounter for fracture with routine healing: Secondary | ICD-10-CM | POA: Diagnosis not present

## 2017-02-13 DIAGNOSIS — Z9181 History of falling: Secondary | ICD-10-CM | POA: Diagnosis not present

## 2017-02-14 DIAGNOSIS — I480 Paroxysmal atrial fibrillation: Secondary | ICD-10-CM | POA: Diagnosis not present

## 2017-02-14 DIAGNOSIS — E785 Hyperlipidemia, unspecified: Secondary | ICD-10-CM | POA: Diagnosis not present

## 2017-02-14 DIAGNOSIS — I10 Essential (primary) hypertension: Secondary | ICD-10-CM | POA: Diagnosis not present

## 2017-02-14 DIAGNOSIS — Z950 Presence of cardiac pacemaker: Secondary | ICD-10-CM | POA: Diagnosis not present

## 2017-02-14 DIAGNOSIS — Z7901 Long term (current) use of anticoagulants: Secondary | ICD-10-CM | POA: Diagnosis not present

## 2017-02-14 DIAGNOSIS — E119 Type 2 diabetes mellitus without complications: Secondary | ICD-10-CM | POA: Diagnosis not present

## 2017-02-14 DIAGNOSIS — I482 Chronic atrial fibrillation: Secondary | ICD-10-CM | POA: Diagnosis not present

## 2017-02-14 DIAGNOSIS — I38 Endocarditis, valve unspecified: Secondary | ICD-10-CM | POA: Diagnosis not present

## 2017-02-14 DIAGNOSIS — I340 Nonrheumatic mitral (valve) insufficiency: Secondary | ICD-10-CM | POA: Diagnosis not present

## 2017-02-14 DIAGNOSIS — E039 Hypothyroidism, unspecified: Secondary | ICD-10-CM | POA: Diagnosis not present

## 2017-02-14 DIAGNOSIS — I4891 Unspecified atrial fibrillation: Secondary | ICD-10-CM | POA: Diagnosis not present

## 2017-02-14 DIAGNOSIS — I495 Sick sinus syndrome: Secondary | ICD-10-CM | POA: Diagnosis not present

## 2017-02-14 DIAGNOSIS — K219 Gastro-esophageal reflux disease without esophagitis: Secondary | ICD-10-CM | POA: Diagnosis not present

## 2017-02-14 DIAGNOSIS — Z888 Allergy status to other drugs, medicaments and biological substances status: Secondary | ICD-10-CM | POA: Diagnosis not present

## 2017-02-14 DIAGNOSIS — Z79899 Other long term (current) drug therapy: Secondary | ICD-10-CM | POA: Diagnosis not present

## 2017-02-18 DIAGNOSIS — S129XXA Fracture of neck, unspecified, initial encounter: Secondary | ICD-10-CM | POA: Diagnosis not present

## 2017-02-18 DIAGNOSIS — S12300D Unspecified displaced fracture of fourth cervical vertebra, subsequent encounter for fracture with routine healing: Secondary | ICD-10-CM | POA: Diagnosis not present

## 2017-02-18 DIAGNOSIS — R202 Paresthesia of skin: Secondary | ICD-10-CM | POA: Diagnosis not present

## 2017-02-18 DIAGNOSIS — R29898 Other symptoms and signs involving the musculoskeletal system: Secondary | ICD-10-CM | POA: Diagnosis not present

## 2017-02-18 DIAGNOSIS — Z9181 History of falling: Secondary | ICD-10-CM | POA: Diagnosis not present

## 2017-02-24 ENCOUNTER — Other Ambulatory Visit: Payer: Self-pay | Admitting: Family Medicine

## 2017-03-01 ENCOUNTER — Ambulatory Visit (INDEPENDENT_AMBULATORY_CARE_PROVIDER_SITE_OTHER): Payer: Medicare Other | Admitting: Family Medicine

## 2017-03-01 ENCOUNTER — Other Ambulatory Visit: Payer: Self-pay | Admitting: Family Medicine

## 2017-03-01 DIAGNOSIS — I10 Essential (primary) hypertension: Secondary | ICD-10-CM

## 2017-03-01 DIAGNOSIS — S2241XD Multiple fractures of ribs, right side, subsequent encounter for fracture with routine healing: Secondary | ICD-10-CM

## 2017-03-01 DIAGNOSIS — I051 Rheumatic mitral insufficiency: Secondary | ICD-10-CM | POA: Diagnosis not present

## 2017-03-01 DIAGNOSIS — E119 Type 2 diabetes mellitus without complications: Secondary | ICD-10-CM

## 2017-03-01 DIAGNOSIS — I480 Paroxysmal atrial fibrillation: Secondary | ICD-10-CM

## 2017-03-01 DIAGNOSIS — M5136 Other intervertebral disc degeneration, lumbar region: Secondary | ICD-10-CM

## 2017-03-01 DIAGNOSIS — I4892 Unspecified atrial flutter: Secondary | ICD-10-CM | POA: Diagnosis not present

## 2017-03-01 DIAGNOSIS — W1811XD Fall from or off toilet without subsequent striking against object, subsequent encounter: Secondary | ICD-10-CM | POA: Diagnosis not present

## 2017-03-01 DIAGNOSIS — I4891 Unspecified atrial fibrillation: Secondary | ICD-10-CM | POA: Diagnosis not present

## 2017-03-01 DIAGNOSIS — S022XXD Fracture of nasal bones, subsequent encounter for fracture with routine healing: Secondary | ICD-10-CM

## 2017-03-01 DIAGNOSIS — F419 Anxiety disorder, unspecified: Secondary | ICD-10-CM

## 2017-03-01 DIAGNOSIS — I495 Sick sinus syndrome: Secondary | ICD-10-CM | POA: Diagnosis not present

## 2017-03-01 DIAGNOSIS — S12390D Other displaced fracture of fourth cervical vertebra, subsequent encounter for fracture with routine healing: Secondary | ICD-10-CM

## 2017-03-01 DIAGNOSIS — M50221 Other cervical disc displacement at C4-C5 level: Secondary | ICD-10-CM

## 2017-03-05 ENCOUNTER — Other Ambulatory Visit: Payer: Self-pay | Admitting: Family Medicine

## 2017-03-05 NOTE — Telephone Encounter (Signed)
Last lipid 07/13/16  Dr Livia Snellen

## 2017-03-06 DIAGNOSIS — I480 Paroxysmal atrial fibrillation: Secondary | ICD-10-CM | POA: Diagnosis not present

## 2017-03-06 DIAGNOSIS — I272 Pulmonary hypertension, unspecified: Secondary | ICD-10-CM | POA: Diagnosis not present

## 2017-03-06 DIAGNOSIS — Z7901 Long term (current) use of anticoagulants: Secondary | ICD-10-CM | POA: Diagnosis not present

## 2017-03-06 DIAGNOSIS — I495 Sick sinus syndrome: Secondary | ICD-10-CM | POA: Diagnosis not present

## 2017-03-06 DIAGNOSIS — I340 Nonrheumatic mitral (valve) insufficiency: Secondary | ICD-10-CM | POA: Diagnosis not present

## 2017-03-06 DIAGNOSIS — Z950 Presence of cardiac pacemaker: Secondary | ICD-10-CM | POA: Diagnosis not present

## 2017-03-06 DIAGNOSIS — I517 Cardiomegaly: Secondary | ICD-10-CM | POA: Diagnosis not present

## 2017-03-06 DIAGNOSIS — Z9889 Other specified postprocedural states: Secondary | ICD-10-CM | POA: Diagnosis not present

## 2017-03-09 ENCOUNTER — Other Ambulatory Visit: Payer: Self-pay | Admitting: Family Medicine

## 2017-03-18 ENCOUNTER — Encounter: Payer: Self-pay | Admitting: Physician Assistant

## 2017-03-18 ENCOUNTER — Ambulatory Visit (INDEPENDENT_AMBULATORY_CARE_PROVIDER_SITE_OTHER): Payer: Medicare Other | Admitting: Physician Assistant

## 2017-03-18 VITALS — BP 98/61 | HR 77 | Temp 97.7°F | Ht 64.0 in | Wt 159.0 lb

## 2017-03-18 DIAGNOSIS — K645 Perianal venous thrombosis: Secondary | ICD-10-CM

## 2017-03-18 DIAGNOSIS — Z7901 Long term (current) use of anticoagulants: Secondary | ICD-10-CM

## 2017-03-18 DIAGNOSIS — E119 Type 2 diabetes mellitus without complications: Secondary | ICD-10-CM

## 2017-03-18 DIAGNOSIS — Z1231 Encounter for screening mammogram for malignant neoplasm of breast: Secondary | ICD-10-CM

## 2017-03-18 DIAGNOSIS — E782 Mixed hyperlipidemia: Secondary | ICD-10-CM | POA: Diagnosis not present

## 2017-03-18 DIAGNOSIS — K649 Unspecified hemorrhoids: Secondary | ICD-10-CM | POA: Insufficient documentation

## 2017-03-18 DIAGNOSIS — I482 Chronic atrial fibrillation, unspecified: Secondary | ICD-10-CM

## 2017-03-18 DIAGNOSIS — I10 Essential (primary) hypertension: Secondary | ICD-10-CM

## 2017-03-18 DIAGNOSIS — Z1239 Encounter for other screening for malignant neoplasm of breast: Secondary | ICD-10-CM

## 2017-03-18 MED ORDER — ESCITALOPRAM OXALATE 10 MG PO TABS: ORAL_TABLET | ORAL | 3 refills | Status: DC

## 2017-03-18 MED ORDER — FUROSEMIDE 40 MG PO TABS: 40.0000 mg | ORAL_TABLET | Freq: Every day | ORAL | 3 refills | Status: DC

## 2017-03-18 MED ORDER — DILTIAZEM HCL ER COATED BEADS 120 MG PO CP24: 120.0000 mg | ORAL_CAPSULE | Freq: Every day | ORAL | 1 refills | Status: DC

## 2017-03-18 MED ORDER — LIDOCAINE HCL 2 % EX GEL: 1.0000 | Freq: Three times a day (TID) | CUTANEOUS | 1 refills | Status: DC

## 2017-03-18 MED ORDER — POTASSIUM CHLORIDE CRYS ER 20 MEQ PO TBCR: 20.0000 meq | EXTENDED_RELEASE_TABLET | Freq: Every day | ORAL | 3 refills | Status: DC

## 2017-03-18 MED ORDER — SITAGLIPTIN PHOSPHATE 100 MG PO TABS: 100.0000 mg | ORAL_TABLET | Freq: Every day | ORAL | 2 refills | Status: DC

## 2017-03-18 NOTE — Progress Notes (Signed)
BP 98/61   Pulse 77   Temp 97.7 F (36.5 C) (Oral)   Ht 5' 4"  (1.626 m)   Wt 159 lb (72.1 kg)   BMI 27.29 kg/m    Subjective:    Patient ID: Meredith Anderson, female    DOB: 08/30/1940, 76 y.o.   MRN: [id number]  HPI: Meredith Anderson is a 77 y.o. female presenting on 03/18/2017 for Follow-up (3 month ) and Hemorrhoids  This patient comes in for periodic recheck on medications and conditions including Chronic atrial fibrillation, hypertension, type 2 diabetes, long-term anticoagulant use, hemorrhoids, need for mammogram. Overall she is doing well. Her cardiologist has normalized out her irregular rhythm. She is on some new medications. She has taken diltiazem 180 mg daily. Her blood pressure is quite low today. We will reduce to 120 mg. She will return in 4 weeks to recheck for this. Her hemorrhoids are also bothering her a lot. 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 She is concerned about blood loss since she is on Xarelto. She is not had a profuse amount of blood loss. He'll be a small amount with the hemorrhoid is flared up. She does have some Anusol HC at this time. We can try some topical lidocaine with this..   All medications are reviewed today. There are no reports of any problems with the medications. All of the medical conditions are reviewed and updated.  Lab work is reviewed and will be ordered as medically necessary. There are no new problems reported with today's visit.   Relevant past medical, surgical, family and social history reviewed and updated as indicated. Allergies and medications reviewed and updated.  Past Medical History:  Diagnosis Date  . A-fib ([place])   . Allergy    seasonal  . Anxiety   . Arthritis   . 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 Cataract   . Dyshydrosis   . Hemorrhoids   . Hyperlipidemia   . Hypertension   . Menopause   . NIDDM (non-insulin dependent diabetes mellitus)    diet controlled   . Osteopenia 2016  . Thyroid disease     Past Surgical  History:  Procedure Laterality Date  . ABDOMINAL HYSTERECTOMY     partial  . ABLATION    . bunionectomy bilateral    . EYE SURGERY Bilateral    cataracts  . LUMBAR DISC SURGERY     L4 and L5  . PACEMAKER INSERTION  12/14/2015  . ROTATOR CUFF REPAIR Right   . TUBAL LIGATION      Review of Systems  Constitutional: Negative.  Negative for activity change, fatigue and fever.  HENT: Negative.   Eyes: Negative.   Respiratory: Negative.  Negative for cough, shortness of breath and wheezing.   Cardiovascular: Negative.  Negative for chest pain, palpitations and leg swelling.  Gastrointestinal: Positive for anal bleeding and diarrhea. Negative for abdominal pain.  Endocrine: Negative.   Genitourinary: Negative.  Negative for dysuria.  Musculoskeletal: Positive for arthralgias.  Skin: Negative.   Neurological: Negative.  Negative for weakness.    Allergies as of 03/18/2017      Reactions   Dofetilide Other (See Comments)   Passed out   Amlodipine    Other reaction(s): Other (See Comments) Swollen ankles   Dronedarone Other (See Comments)   Increased BG   Glipizide-metformin Hcl    Other reaction(s): Diarrhea, Other (See Comments) Weakness   Metformin    Other reaction(s): Diarrhea  Niaspan [niacin] Rash      Medication List       Accurate as of 03/18/17  2:50 PM. Always use your most recent med list.          ACCU-CHEK AVIVA PLUS test strip Generic drug:  glucose blood USE TO CHECK BLOOD SUGAR ONCE A DAY OR AS INSTRUCTED   Acetaminophen 500 MG coapsule   bismuth subsalicylate 659 MG chewable tablet Commonly known as:  PEPTO BISMOL as needed. Reported on 07/13/2015   cholecalciferol 1000 units tablet Commonly known as:  VITAMIN D Take 1,000 Units by mouth daily.   diclofenac sodium 1 % Gel Commonly known as:  VOLTAREN Apply 1 to 2 grams to area of pain up to qid as needed   diltiazem 120 MG 24 hr capsule Commonly known as:  CARDIZEM CD Take 1 capsule (120  mg total) by mouth daily.   diphenhydrAMINE 25 mg capsule Commonly known as:  BENADRYL 25 mg as needed.   escitalopram 10 MG tablet Commonly known as:  LEXAPRO TAKE 1 TABLET DAILY (REPLACES CELEXA)   fexofenadine 180 MG tablet Commonly known as:  ALLEGRA Take 1 tablet (180 mg total) by mouth daily. For allergy symptoms   furosemide 40 MG tablet Commonly known as:  LASIX Take 1 tablet (40 mg total) by mouth daily.   gabapentin 100 MG capsule Commonly known as:  NEURONTIN Take 100 mg by mouth 3 (three) times daily.   levothyroxine 150 MCG tablet Commonly known as:  SYNTHROID, LEVOTHROID TAKE 1 TABLET (150 MCG TOTAL) BY MOUTH DAILY BEFORE BREAKFAST.   magnesium oxide 400 MG tablet Commonly known as:  MAG-OX 400 mg.   potassium chloride SA 20 MEQ tablet Commonly known as:  KLOR-CON M20 Take 1 tablet (20 mEq total) by mouth daily.   PREMARIN vaginal cream Generic drug:  conjugated estrogens USING APPLICATOR PLACE 1 GRAM IN VAGINA FOR 3 WEEKS,THEN USE EVERY OTHER NIGHT AS INSTRUCTED   PROCTOSOL HC 2.5 % rectal cream Generic drug:  hydrocortisone APPLY TO RECTAL AREA EVERY 6 HOURS AS NEEDED FOR HEMORRHOIDS   rosuvastatin 20 MG tablet Commonly known as:  CRESTOR TAKE 1 TABLET DAILY   sitaGLIPtin 100 MG tablet Commonly known as:  JANUVIA Take 1 tablet (100 mg total) by mouth daily.   TETRAHYDROZOLINE HCL OP Apply to eye. Reported on 01/02/2016   traMADol 50 MG tablet Commonly known as:  ULTRAM   XARELTO 15 MG Tabs tablet Generic drug:  Rivaroxaban TAKE 1 TABLET (15 MG TOTAL) BY MOUTH DAILY WITH SUPPER.            Discharge Care Instructions        Start     Ordered   03/18/17 0000  diltiazem (CARDIZEM CD) 120 MG 24 hr capsule  Daily    Question:  Supervising Provider  Answer:  Timmothy Euler   03/18/17 1440   03/18/17 0000  escitalopram (LEXAPRO) 10 MG tablet    Question:  Supervising Provider  Answer:  Kenn File L   03/18/17 1440   03/18/17  0000  furosemide (LASIX) 40 MG tablet  Daily    Comments:  PATIENT WANTS 90 DAY SUPPLY  Question:  Supervising Provider  Answer:  Timmothy Euler   03/18/17 1440   03/18/17 0000  potassium chloride SA (KLOR-CON M20) 20 MEQ tablet  Daily    Comments:  5  Question:  Supervising Provider  Answer:  Timmothy Euler   03/18/17 1440   03/18/17 0000  sitaGLIPtin (JANUVIA) 100 MG tablet  Daily    Question:  Supervising Provider  Answer:  Timmothy Euler   03/18/17 1440   03/18/17 0000  MM SCREENING BREAST TOMO BILATERAL    Question Answer Comment  Reason for Exam (SYMPTOM  OR DIAGNOSIS REQUIRED) breast canceer screening   Preferred imaging location? External [place]     03/18/17 1450   03/18/17 0000  CBC with Differential/Platelet     03/18/17 1450   03/18/17 0000  CMP14+EGFR     03/18/17 1450   03/18/17 0000  Lipid panel     03/18/17 1450   03/18/17 0000  TSH     03/18/17 1450   03/18/17 0000  Microalbumin / creatinine urine ratio     03/18/17 1450   03/18/17 0000  Bayer DCA Hb A1c Waived     03/18/17 1450         Objective:    BP 98/61   Pulse 77   Temp 97.7 F (36.5 C) (Oral)   Ht 5' 4"  (1.626 m)   Wt 159 lb (72.1 kg)   BMI 27.29 kg/m   Allergies  Allergen Reactions  . Dofetilide Other (See Comments)    Passed out  . Amlodipine     Other reaction(s): Other (See Comments) Swollen ankles  . Dronedarone Other (See Comments)    Increased BG  . Glipizide-Metformin Hcl     Other reaction(s): Diarrhea, Other (See Comments) Weakness  . Metformin     Other reaction(s): Diarrhea  . Niaspan [Niacin] Rash    Physical Exam  Constitutional: She is oriented to person, place, and time. She appears well-developed and well-nourished.  HENT:  Head: Normocephalic and atraumatic.  Eyes: Pupils are equal, round, and reactive to light. Conjunctivae and EOM are normal.  Cardiovascular: Normal rate, normal heart sounds and intact distal pulses.  An irregularly irregular  rhythm present.  Pulmonary/Chest: Effort normal and breath sounds normal.  Abdominal: Soft. Bowel sounds are normal.  Neurological: She is alert and oriented to person, place, and time. She has normal reflexes.  Skin: Skin is warm and dry. No rash noted.  Psychiatric: She has a normal mood and affect. Her behavior is normal. Judgment and thought content normal.    Results for orders placed or performed in visit on 10/17/16  CBC with Differential/Platelet  Result Value Ref Range   WBC 5.2 3.4 - 10.8 x10E3/uL   RBC 3.94 3.77 - 5.28 x10E6/uL   Hemoglobin 11.8 11.1 - 15.9 g/dL   Hematocrit 35.8 34.0 - 46.6 %   MCV 91 79 - 97 fL   MCH 29.9 26.6 - 33.0 pg   MCHC 33.0 31.5 - 35.7 g/dL   RDW 15.3 12.3 - 15.4 %   Platelets 221 150 - 379 x10E3/uL   Neutrophils 57 Not Estab. %   Lymphs 29 Not Estab. %   Monocytes 11 Not Estab. %   Eos 2 Not Estab. %   Basos 1 Not Estab. %   Neutrophils Absolute 3.0 1.4 - 7.0 x10E3/uL   Lymphocytes Absolute 1.5 0.7 - 3.1 x10E3/uL   Monocytes Absolute 0.6 0.1 - 0.9 x10E3/uL   EOS (ABSOLUTE) 0.1 0.0 - 0.4 x10E3/uL   Basophils Absolute 0.0 0.0 - 0.2 x10E3/uL   Immature Granulocytes 0 Not Estab. %   Immature Grans (Abs) 0.0 0.0 - 0.1 x10E3/uL  CMP14+EGFR  Result Value Ref Range   Glucose 100 (H) 65 - 99 mg/dL   BUN 26 8 - 27 mg/dL  Creatinine, Ser 1.40 (H) 0.57 - 1.00 mg/dL   GFR calc non Af Amer 37 (L) >59 mL/min/1.73   GFR calc Af Amer 42 (L) >59 mL/min/1.73   BUN/Creatinine Ratio 19 12 - 28   Sodium 138 134 - 144 mmol/L   Potassium 4.2 3.5 - 5.2 mmol/L   Chloride 95 (L) 96 - 106 mmol/L   CO2 23 18 - 29 mmol/L   Calcium 9.9 8.7 - 10.3 mg/dL   Total Protein 7.8 6.0 - 8.5 g/dL   Albumin 5.0 (H) 3.5 - 4.8 g/dL   Globulin, Total 2.8 1.5 - 4.5 g/dL   Albumin/Globulin Ratio 1.8 1.2 - 2.2   Bilirubin Total 0.4 0.0 - 1.2 mg/dL   Alkaline Phosphatase 112 39 - 117 IU/L   AST 17 0 - 40 IU/L   ALT 13 0 - 32 IU/L  Bayer DCA Hb A1c Waived  Result Value Ref  Range   Bayer DCA Hb A1c Waived 7.0 (H) <7.0 %  Microalbumin / creatinine urine ratio  Result Value Ref Range   Creatinine, Urine 102.7 Not Estab. mg/dL   Albumin, Urine 8.1 Not Estab. ug/mL   Microalb/Creat Ratio 7.9 0.0 - 30.0 mg/g creat  TSH + free T4  Result Value Ref Range   TSH 1.350 0.450 - 4.500 uIU/mL   Free T4 1.69 0.82 - 1.77 ng/dL      Assessment & Plan:   1. Chronic atrial fibrillation (HCC) - TSH; Future  2. Essential hypertension, benign - CBC with Differential/Platelet; Future - CMP14+EGFR; Future - TSH; Future - Microalbumin / creatinine urine ratio; Future - Bayer DCA Hb A1c Waived; Future  3. Type 2 diabetes mellitus without complication, without long-term current use of insulin (HCC) - CMP14+EGFR; Future - Lipid panel; Future - TSH; Future - Microalbumin / creatinine urine ratio; Future - Bayer DCA Hb A1c Waived; Future  4. Elevated cholesterol with high triglycerides  5. Long term current use of anticoagulant therapy  6. Perianal venous thrombosis  7. Breast cancer screening - MM SCREENING BREAST TOMO BILATERAL; Future    Current Outpatient Prescriptions:  .  ACCU-CHEK AVIVA PLUS test strip, USE TO CHECK BLOOD SUGAR ONCE A DAY OR AS INSTRUCTED, Disp: 50 each, Rfl: 2 .  Acetaminophen 500 MG coapsule, , Disp: , Rfl:  .  bismuth subsalicylate (PEPTO BISMOL) 262 MG chewable tablet, as needed. Reported on 07/13/2015, Disp: , Rfl:  .  cholecalciferol (VITAMIN D) 1000 UNITS tablet, Take 1,000 Units by mouth daily., Disp: , Rfl:  .  diclofenac sodium (VOLTAREN) 1 % GEL, Apply 1 to 2 grams to area of pain up to qid as needed, Disp: 100 g, Rfl: 2 .  diltiazem (CARDIZEM CD) 120 MG 24 hr capsule, Take 1 capsule (120 mg total) by mouth daily., Disp: 30 capsule, Rfl: 1 .  diphenhydrAMINE (BENADRYL) 25 mg capsule, 25 mg as needed. , Disp: , Rfl:  .  escitalopram (LEXAPRO) 10 MG tablet, TAKE 1 TABLET DAILY (REPLACES CELEXA), Disp: 90 tablet, Rfl: 3 .   fexofenadine (ALLEGRA) 180 MG tablet, Take 1 tablet (180 mg total) by mouth daily. For allergy symptoms, Disp: 30 tablet, Rfl: 11 .  furosemide (LASIX) 40 MG tablet, Take 1 tablet (40 mg total) by mouth daily., Disp: 90 tablet, Rfl: 3 .  gabapentin (NEURONTIN) 100 MG capsule, Take 100 mg by mouth 3 (three) times daily. , Disp: , Rfl:  .  levothyroxine (SYNTHROID, LEVOTHROID) 150 MCG tablet, TAKE 1 TABLET (150 MCG TOTAL) BY MOUTH  DAILY BEFORE BREAKFAST., Disp: 90 tablet, Rfl: 3 .  magnesium oxide (MAG-OX) 400 MG tablet, 400 mg., Disp: , Rfl:  .  potassium chloride SA (KLOR-CON M20) 20 MEQ tablet, Take 1 tablet (20 mEq total) by mouth daily., Disp: 90 tablet, Rfl: 3 .  PREMARIN vaginal cream, USING APPLICATOR PLACE 1 GRAM IN VAGINA FOR 3 WEEKS,THEN USE EVERY OTHER NIGHT AS INSTRUCTED, Disp: 30 g, Rfl: 4 .  PROCTOSOL HC 2.5 % rectal cream, APPLY TO RECTAL AREA EVERY 6 HOURS AS NEEDED FOR HEMORRHOIDS, Disp: 28.35 g, Rfl: 1 .  rosuvastatin (CRESTOR) 20 MG tablet, TAKE 1 TABLET DAILY, Disp: 90 tablet, Rfl: 0 .  TETRAHYDROZOLINE HCL OP, Apply to eye. Reported on 01/02/2016, Disp: , Rfl:  .  traMADol (ULTRAM) 50 MG tablet, , Disp: , Rfl:  .  XARELTO 15 MG TABS tablet, TAKE 1 TABLET (15 MG TOTAL) BY MOUTH DAILY WITH SUPPER., Disp: 90 tablet, Rfl: 0 .  sitaGLIPtin (JANUVIA) 100 MG tablet, Take 1 tablet (100 mg total) by mouth daily., Disp: 30 tablet, Rfl: 2 Continue all other maintenance medications as listed above.  Follow up plan: Return in about 4 weeks (around 04/15/2017) for female exam and recheck.  Educational handout given for [place] PA-C [place] [address]  [place], [place] [address] [phone]   03/18/2017, 2:50 PM

## 2017-03-18 NOTE — Patient Instructions (Signed)
In a few days you may receive a survey in the mail or online from [employer] regarding your visit with us today. Please take a moment to fill this out. Your feedback is very important to our whole office. It can help us better understand your needs as well as improve your experience and satisfaction. Thank you for taking your time to complete it. We care about you.  Tilak Oakley, PA-C  

## 2017-03-22 ENCOUNTER — Other Ambulatory Visit: Payer: Medicare Other

## 2017-03-22 DIAGNOSIS — E119 Type 2 diabetes mellitus without complications: Secondary | ICD-10-CM

## 2017-03-22 DIAGNOSIS — I482 Chronic atrial fibrillation, unspecified: Secondary | ICD-10-CM

## 2017-03-22 DIAGNOSIS — I10 Essential (primary) hypertension: Secondary | ICD-10-CM | POA: Diagnosis not present

## 2017-03-22 LAB — BAYER DCA HB A1C WAIVED: HB A1C (BAYER DCA - WAIVED): 6.5 % (ref <7.0)

## 2017-03-23 LAB — CBC WITH DIFFERENTIAL/PLATELET
BASOS: 1 %
Basophils Absolute: 0 10*3/uL (ref 0.0–0.2)
EOS (ABSOLUTE): 0.1 10*3/uL (ref 0.0–0.4)
EOS: 3 %
HEMATOCRIT: 34.8 % (ref 34.0–46.6)
HEMOGLOBIN: 11.2 g/dL (ref 11.1–15.9)
IMMATURE GRANS (ABS): 0 10*3/uL (ref 0.0–0.1)
IMMATURE GRANULOCYTES: 0 %
LYMPHS: 30 %
Lymphocytes Absolute: 1.1 10*3/uL (ref 0.7–3.1)
MCH: 29.1 pg (ref 26.6–33.0)
MCHC: 32.2 g/dL (ref 31.5–35.7)
MCV: 90 fL (ref 79–97)
Monocytes Absolute: 0.4 10*3/uL (ref 0.1–0.9)
Monocytes: 12 %
NEUTROS ABS: 2 10*3/uL (ref 1.4–7.0)
NEUTROS PCT: 54 %
PLATELETS: 220 10*3/uL (ref 150–379)
RBC: 3.85 x10E6/uL (ref 3.77–5.28)
RDW: 14.3 % (ref 12.3–15.4)
WBC: 3.7 10*3/uL (ref 3.4–10.8)

## 2017-03-23 LAB — CMP14+EGFR
A/G RATIO: 1.7 (ref 1.2–2.2)
ALBUMIN: 4.6 g/dL (ref 3.5–4.8)
ALT: 11 IU/L (ref 0–32)
AST: 18 IU/L (ref 0–40)
Alkaline Phosphatase: 134 IU/L — ABNORMAL HIGH (ref 39–117)
BILIRUBIN TOTAL: 0.5 mg/dL (ref 0.0–1.2)
BUN / CREAT RATIO: 19 (ref 12–28)
BUN: 18 mg/dL (ref 8–27)
CALCIUM: 10 mg/dL (ref 8.7–10.3)
CO2: 21 mmol/L (ref 20–29)
Chloride: 103 mmol/L (ref 96–106)
Creatinine, Ser: 0.96 mg/dL (ref 0.57–1.00)
GFR, EST AFRICAN AMERICAN: 67 mL/min/{1.73_m2} (ref >59)
GFR, EST NON AFRICAN AMERICAN: 58 mL/min/{1.73_m2} — AB (ref >59)
GLOBULIN, TOTAL: 2.7 g/dL (ref 1.5–4.5)
Glucose: 125 mg/dL — ABNORMAL HIGH (ref 65–99)
POTASSIUM: 4.1 mmol/L (ref 3.5–5.2)
Sodium: 142 mmol/L (ref 134–144)
TOTAL PROTEIN: 7.3 g/dL (ref 6.0–8.5)

## 2017-03-23 LAB — LIPID PANEL
CHOL/HDL RATIO: 3 ratio (ref 0.0–4.4)
Cholesterol, Total: 97 mg/dL — ABNORMAL LOW (ref 100–199)
HDL: 32 mg/dL — AB (ref >39)
LDL Calculated: 34 mg/dL (ref 0–99)
Triglycerides: 156 mg/dL — ABNORMAL HIGH (ref 0–149)
VLDL Cholesterol Cal: 31 mg/dL (ref 5–40)

## 2017-03-23 LAB — TSH: TSH: 0.02 u[IU]/mL — AB (ref 0.450–4.500)

## 2017-03-23 LAB — MICROALBUMIN / CREATININE URINE RATIO
Creatinine, Urine: 32.1 mg/dL
MICROALBUM., U, RANDOM: 3.7 ug/mL
Microalb/Creat Ratio: 11.5 mg/g creat (ref 0.0–30.0)

## 2017-03-25 ENCOUNTER — Other Ambulatory Visit: Payer: Self-pay | Admitting: Physician Assistant

## 2017-03-25 MED ORDER — LEVOTHYROXINE SODIUM 125 MCG PO TABS: 125.0000 ug | ORAL_TABLET | Freq: Every day | ORAL | 0 refills | Status: DC

## 2017-03-27 ENCOUNTER — Telehealth: Payer: Self-pay | Admitting: Physician Assistant

## 2017-04-05 ENCOUNTER — Ambulatory Visit (INDEPENDENT_AMBULATORY_CARE_PROVIDER_SITE_OTHER): Payer: Medicare Other | Admitting: Family Medicine

## 2017-04-05 VITALS — BP 120/74 | HR 87 | Temp 97.5°F | Ht 64.0 in | Wt 161.0 lb

## 2017-04-05 DIAGNOSIS — J069 Acute upper respiratory infection, unspecified: Secondary | ICD-10-CM | POA: Diagnosis not present

## 2017-04-05 MED ORDER — AMOXICILLIN 500 MG PO CAPS: 500.0000 mg | ORAL_CAPSULE | Freq: Two times a day (BID) | ORAL | 0 refills | Status: AC

## 2017-04-05 MED ORDER — BENZONATATE 200 MG PO CAPS: 200.0000 mg | ORAL_CAPSULE | Freq: Two times a day (BID) | ORAL | 0 refills | Status: DC | PRN

## 2017-04-05 NOTE — Patient Instructions (Signed)
I prescribed amoxicillin to take twice a day for the next 10 days. I recommend that you continue sinus rinses as needed. You may also continue Tylenol as needed. We discussed using Chloraseptic spray for sore throat. Continue salt water gargles. If your symptoms worsen, you develop high fever, inability to stay hydrated, difficulty breathing please seek immediate medical attention.   Upper Respiratory Infection, Adult Most upper respiratory infections (URIs) are a viral infection of the air passages leading to the lungs. A URI affects the nose, throat, and upper air passages. The most common type of URI is nasopharyngitis and is typically referred to as "the common cold." URIs run their course and usually go away on their own. Most of the time, a URI does not require medical attention, but sometimes a bacterial infection in the upper airways can follow a viral infection. This is called a secondary infection. Sinus and middle ear infections are common types of secondary upper respiratory infections. Bacterial pneumonia can also complicate a URI. A URI can worsen asthma and chronic obstructive pulmonary disease (COPD). Sometimes, these complications can require emergency medical care and may be life threatening. What are the causes? Almost all URIs are caused by viruses. A virus is a type of germ and can spread from one person to another. What increases the risk? You may be at risk for a URI if:  You smoke.  You have chronic heart or lung disease.  You have a weakened defense (immune) system.  You are very young or very old.  You have nasal allergies or asthma.  You work in crowded or poorly ventilated areas.  You work in health care facilities or schools.  What are the signs or symptoms? Symptoms typically develop 2-3 days after you come in contact with a cold virus. Most viral URIs last 7-10 days. However, viral URIs from the influenza virus (flu virus) can last 14-18 days and are  typically more severe. Symptoms may include:  Runny or stuffy (congested) nose.  Sneezing.  Cough.  Sore throat.  Headache.  Fatigue.  Fever.  Loss of appetite.  Pain in your forehead, behind your eyes, and over your cheekbones (sinus pain).  Muscle aches.  How is this diagnosed? Your health care provider may diagnose a URI by:  Physical exam.  Tests to check that your symptoms are not due to another condition such as: ? Strep throat. ? Sinusitis. ? Pneumonia. ? Asthma.  How is this treated? A URI goes away on its own with time. It cannot be cured with medicines, but medicines may be prescribed or recommended to relieve symptoms. Medicines may help:  Reduce your fever.  Reduce your cough.  Relieve nasal congestion.  Follow these instructions at home:  Take medicines only as directed by your health care provider.  Gargle warm saltwater or take cough drops to comfort your throat as directed by your health care provider.  Use a warm mist humidifier or inhale steam from a shower to increase air moisture. This may make it easier to breathe.  Drink enough fluid to keep your urine clear or pale yellow.  Eat soups and other clear broths and maintain good nutrition.  Rest as needed.  Return to work when your temperature has returned to normal or as your health care provider advises. You may need to stay home longer to avoid infecting others. You can also use a face mask and careful hand washing to prevent spread of the virus.  Increase the usage of your  inhaler if you have asthma.  Do not use any tobacco products, including cigarettes, chewing tobacco, or electronic cigarettes. If you need help quitting, ask your health care provider. How is this prevented? The best way to protect yourself from getting a cold is to practice good hygiene.  Avoid oral or hand contact with people with cold symptoms.  Wash your hands often if contact occurs.  There is no clear  evidence that vitamin C, vitamin E, echinacea, or exercise reduces the chance of developing a cold. However, it is always recommended to get plenty of rest, exercise, and practice good nutrition. Contact a health care provider if:  You are getting worse rather than better.  Your symptoms are not controlled by medicine.  You have chills.  You have worsening shortness of breath.  You have brown or red mucus.  You have yellow or brown nasal discharge.  You have pain in your face, especially when you bend forward.  You have a fever.  You have swollen neck glands.  You have pain while swallowing.  You have white areas in the back of your throat. Get help right away if:  You have severe or persistent: ? Headache. ? Ear pain. ? Sinus pain. ? Chest pain.  You have chronic lung disease and any of the following: ? Wheezing. ? Prolonged cough. ? Coughing up blood. ? A change in your usual mucus.  You have a stiff neck.  You have changes in your: ? Vision. ? Hearing. ? Thinking. ? Mood. This information is not intended to replace advice given to you by your health care provider. Make sure you discuss any questions you have with your health care provider. Document Released: 12/12/2000 Document Revised: 02/19/2016 Document Reviewed: 09/23/2013 Elsevier Interactive Patient Education  2017 [employer].

## 2017-04-05 NOTE — Progress Notes (Signed)
Subjective: Meredith Anderson issue PCP: Terald Sleeper, PA-C Meredith Anderson is a 76 y.o. female presenting to clinic today for:  1. Sinusitis Patient reports onset of rhinorrhea, swollen lymph node, sore throat and bilateral ear discomfort on Tuesday. She reports a productive cough, with green mucus. No hemoptysis. No dysphagia. No shortness of breath, fevers, chills, nausea, vomiting, changes in vision, dizziness or sick contacts. She reports she has been using Best boy for cough with good relief. She is also been using a humidifier at home with some relief in the nasal congestion. She notes associated headache that is mild and relieved by Tylenol. She notes she gets a sinus infection about once a year and that this is what this feels like.  Allergies  Allergen Reactions  . Dofetilide Other (See Comments)    Passed out  . Amlodipine     Other reaction(s): Other (See Comments) Swollen ankles  . Dronedarone Other (See Comments)    Increased BG  . Glipizide-Metformin Hcl     Other reaction(s): Diarrhea, Other (See Comments) Weakness  . Metformin     Other reaction(s): Diarrhea  . Niaspan [Niacin] Rash   Past Medical History:  Diagnosis Date  . A-fib ([place])   . Allergy    seasonal  . Anxiety   . Arthritis   . 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 Cataract   . Dyshydrosis   . Hemorrhoids   . Hyperlipidemia   . Hypertension   . Menopause   . NIDDM (non-insulin dependent diabetes mellitus)    diet controlled   . Osteopenia 2016  . Thyroid disease    Family History  Problem Relation Age of Onset  . Stroke Mother   . Diabetes Mother   . Heart disease Father   . Atrial fibrillation Father   . Cancer Sister        ovarian / colon  . Diabetes Sister   . Heart disease Brother   . Hyperlipidemia Brother   . Hypertension Brother   . Diabetes Brother   . Heart attack Brother 53       had to perform emergency surgery  . Dementia Brother   . Hyperlipidemia  Sister   . Cancer Sister        liver  . Hyperlipidemia Sister   . Diabetes Sister        [place] DM  . Cancer Sister   . Diabetes Sister   . Diabetes Daughter   . Rheum arthritis Daughter   . Psoriasis Daughter    Social Hx: non smoker.Current medications reviewed.   ROS: Per HPI  Objective: Office vital signs reviewed. BP 120/74   Pulse 87   Temp (!) 97.5 F (36.4 C) (Oral)   Ht 5\' 4"  (1.626 m)   Wt 161 lb (73 kg)   BMI 27.64 kg/m   Physical Examination:  General: Awake, alert, well nourished, well appearing elderly female, No acute distress HEENT: Normal, mild tenderness to palpation of the frontal sinuses    Neck: No masses palpated. Mildly enlarged submandibular lymph nodes bilaterally. No palpable anterior or posterior cervical lymph nodes.    Ears: Tympanic membranes intact, dulled light reflex bilaterally, no erythema, no bulging    Eyes: PERRLA, extraocular membranes intact, sclera white    Nose: nasal turbinates moist, clear nasal discharge    Throat: moist mucus membranes, mild oropharyngeal erythema, no tonsillar exudate.  Airway is patent Cardio: regular rate, S1S2 heard, no  murmurs appreciated Pulm: clear to auscultation bilaterally, no wheezes, rhonchi or rales; normal work of breathing on room air   Assessment/ Plan: 76 y.o. female   1. Upper respiratory tract infection, unspecified type Patient is nontoxic appearing. She appears well-hydrated. She is afebrile with normal vital signs. I suspect that this is a viral URI. However, I have provided patient a pocket prescription should symptoms worsen over the weekend. I have also refilled her Ladona Ridgel, as she did note that this was helpful. I recommended that she consider using Chloraseptic spray or lozenges for sore throat. She may continue Tylenol as needed for headache or sore throat. I did advise her to not use oral NSAIDs, as she is on an anticoagulant. Return precautions were reviewed with the  patient. She was good understanding. She will follow up as needed. - amoxicillin (AMOXIL) 500 MG capsule; Take 1 capsule (500 mg total) by mouth 2 (two) times daily.  Dispense: 20 capsule; Refill: 0   No orders of the defined types were placed in this encounter.  No orders of the defined types were placed in this encounter.    Janora Norlander, DO [facility] [phone]

## 2017-04-15 ENCOUNTER — Encounter: Payer: Medicare Other | Admitting: Physician Assistant

## 2017-04-17 ENCOUNTER — Ambulatory Visit (INDEPENDENT_AMBULATORY_CARE_PROVIDER_SITE_OTHER): Payer: Medicare Other | Admitting: Physician Assistant

## 2017-04-17 ENCOUNTER — Encounter: Payer: Self-pay | Admitting: Physician Assistant

## 2017-04-17 VITALS — BP 117/69 | HR 91 | Temp 97.0°F | Ht 64.0 in | Wt 159.2 lb

## 2017-04-17 DIAGNOSIS — Z01419 Encounter for gynecological examination (general) (routine) without abnormal findings: Secondary | ICD-10-CM

## 2017-04-17 DIAGNOSIS — Z23 Encounter for immunization: Secondary | ICD-10-CM | POA: Diagnosis not present

## 2017-04-17 DIAGNOSIS — E039 Hypothyroidism, unspecified: Secondary | ICD-10-CM

## 2017-04-17 DIAGNOSIS — E119 Type 2 diabetes mellitus without complications: Secondary | ICD-10-CM

## 2017-04-17 MED ORDER — SITAGLIPTIN PHOSPHATE 100 MG PO TABS: 100.0000 mg | ORAL_TABLET | Freq: Every day | ORAL | 1 refills | Status: DC

## 2017-04-17 MED ORDER — DILTIAZEM HCL ER COATED BEADS 120 MG PO CP24: 120.0000 mg | ORAL_CAPSULE | Freq: Every day | ORAL | 3 refills | Status: DC

## 2017-04-17 NOTE — Progress Notes (Signed)
BP 117/69   Pulse 91   Temp (!) 97 F (36.1 C) (Oral)   Ht 5' 4"  (1.626 m)   Wt 159 lb 3.2 oz (72.2 kg)   BMI 27.33 kg/m    Subjective:    Patient ID: Meredith Anderson, female    DOB: 1940-11-20, 76 y.o.   MRN: [id number]  HPI: Meredith Anderson is a 76 y.o. female presenting on 04/17/2017 for Annual Exam  This patient comes in for annual well physical examination. All medications are reviewed today. There are no reports of any problems with the medications. All of the medical conditions are reviewed and updated.  Lab work is reviewed and will be ordered as medically necessary. There are no new problems reported with today's visit.  Patient reports doing well overall.  The patient's sister had cervical cancer in her 12s. This is why we did a Pap today. She has never had a history of any abnormal Paps ever. She did have a hysterectomy, partial, many years ago. It was related to uterine prolapse.  Relevant past medical, surgical, family and social history reviewed and updated as indicated. Allergies and medications reviewed and updated.  Past Medical History:  Diagnosis Date  . A-fib ([place])   . Allergy    seasonal  . Anxiety   . Arthritis   . 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 Cataract   . Dyshydrosis   . Hemorrhoids   . Hyperlipidemia   . Hypertension   . Menopause   . NIDDM (non-insulin dependent diabetes mellitus)    diet controlled   . Osteopenia 2016  . Thyroid disease     Past Surgical History:  Procedure Laterality Date  . ABDOMINAL HYSTERECTOMY     partial, prolapse  . ABLATION    . bunionectomy bilateral    . EYE SURGERY Bilateral    cataracts  . LUMBAR DISC SURGERY     L4 and L5  . PACEMAKER INSERTION  12/14/2015  . ROTATOR CUFF REPAIR Right   . TUBAL LIGATION      Review of Systems  Constitutional: Negative.  Negative for activity change, fatigue and fever.  HENT: Negative.   Eyes: Negative.   Respiratory: Negative.  Negative for cough.     Cardiovascular: Negative.  Negative for chest pain.  Gastrointestinal: Negative.  Negative for abdominal pain.  Endocrine: Negative.   Genitourinary: Negative.  Negative for dysuria.  Musculoskeletal: Negative.   Skin: Negative.   Neurological: Negative.     Allergies as of 04/17/2017      Reactions   Dofetilide Other (See Comments)   Passed out   Amlodipine    Other reaction(s): Other (See Comments) Swollen ankles   Dronedarone Other (See Comments)   Increased BG   Glipizide-metformin Hcl    Other reaction(s): Diarrhea, Other (See Comments) Weakness   Metformin    Other reaction(s): Diarrhea   Niaspan [niacin] Rash      Medication List       Accurate as of 04/17/17  4:27 PM. Always use your most recent med list.          ACCU-CHEK AVIVA PLUS test strip Generic drug:  glucose blood USE TO CHECK BLOOD SUGAR ONCE A DAY OR AS INSTRUCTED   Acetaminophen 500 MG coapsule   benzonatate 200 MG capsule Commonly known as:  TESSALON Take 1 capsule (200 mg total) by mouth 2 (two) times daily as needed for cough.   cholecalciferol  1000 units tablet Commonly known as:  VITAMIN D Take 1,000 Units by mouth daily.   diltiazem 120 MG 24 hr capsule Commonly known as:  CARDIZEM CD Take 1 capsule (120 mg total) by mouth daily.   diphenhydrAMINE 25 mg capsule Commonly known as:  BENADRYL 25 mg as needed.   escitalopram 10 MG tablet Commonly known as:  LEXAPRO TAKE 1 TABLET DAILY (REPLACES CELEXA)   fexofenadine 180 MG tablet Commonly known as:  ALLEGRA Take 1 tablet (180 mg total) by mouth daily. For allergy symptoms   furosemide 40 MG tablet Commonly known as:  LASIX Take 1 tablet (40 mg total) by mouth daily.   gabapentin 100 MG capsule Commonly known as:  NEURONTIN Take 100 mg by mouth 3 (three) times daily.   levothyroxine 125 MCG tablet Commonly known as:  SYNTHROID, LEVOTHROID Take 1 tablet (125 mcg total) by mouth daily before breakfast.   lidocaine 2 %  jelly Commonly known as:  XYLOCAINE Apply 1 application topically 3 (three) times daily. Apply to hemorrhoid when painful   magnesium oxide 400 MG tablet Commonly known as:  MAG-OX 400 mg.   potassium chloride SA 20 MEQ tablet Commonly known as:  KLOR-CON M20 Take 1 tablet (20 mEq total) by mouth daily.   PREMARIN vaginal cream Generic drug:  conjugated estrogens USING APPLICATOR PLACE 1 GRAM IN VAGINA FOR 3 WEEKS,THEN USE EVERY OTHER NIGHT AS INSTRUCTED   PROCTOSOL HC 2.5 % rectal cream Generic drug:  hydrocortisone APPLY TO RECTAL AREA EVERY 6 HOURS AS NEEDED FOR HEMORRHOIDS   rosuvastatin 20 MG tablet Commonly known as:  CRESTOR TAKE 1 TABLET DAILY   sitaGLIPtin 100 MG tablet Commonly known as:  JANUVIA Take 1 tablet (100 mg total) by mouth daily.   TETRAHYDROZOLINE HCL OP Apply to eye. Reported on 01/02/2016   XARELTO 15 MG Tabs tablet Generic drug:  Rivaroxaban TAKE 1 TABLET (15 MG TOTAL) BY MOUTH DAILY WITH SUPPER.          Objective:    BP 117/69   Pulse 91   Temp (!) 97 F (36.1 C) (Oral)   Ht 5' 4"  (1.626 m)   Wt 159 lb 3.2 oz (72.2 kg)   BMI 27.33 kg/m   Allergies  Allergen Reactions  . Dofetilide Other (See Comments)    Passed out  . Amlodipine     Other reaction(s): Other (See Comments) Swollen ankles  . Dronedarone Other (See Comments)    Increased BG  . Glipizide-Metformin Hcl     Other reaction(s): Diarrhea, Other (See Comments) Weakness  . Metformin     Other reaction(s): Diarrhea  . Niaspan [Niacin] Rash    Physical Exam  Constitutional: She is oriented to person, place, and time. She appears well-developed and well-nourished.  HENT:  Head: Normocephalic and atraumatic.  Eyes: Pupils are equal, round, and reactive to light. Conjunctivae and EOM are normal.  Neck: Normal range of motion. Neck supple.  Cardiovascular: Normal rate, regular rhythm, normal heart sounds and intact distal pulses.   Pulmonary/Chest: Effort normal and  breath sounds normal. Right breast exhibits no mass, no skin change and no tenderness. Left breast exhibits no mass, no skin change and no tenderness. Breasts are symmetrical.  Abdominal: Soft. Bowel sounds are normal.  Genitourinary: Vagina normal and uterus normal. Rectal exam shows no fissure. No breast swelling, tenderness, discharge or bleeding. There is no tenderness or lesion on the right labia. There is no tenderness or lesion on the left labia.  Uterus is not deviated, not enlarged and not tender. Cervix exhibits no motion tenderness, no discharge and no friability. Right adnexum displays no mass, no tenderness and no fullness. Left adnexum displays no mass, no tenderness and no fullness. No tenderness or bleeding in the vagina. No vaginal discharge found.  Neurological: She is alert and oriented to person, place, and time. She has normal reflexes.  Skin: Skin is warm and dry. No rash noted.  Psychiatric: She has a normal mood and affect. Her behavior is normal. Judgment and thought content normal.    Results for orders placed or performed in visit on 03/22/17  CBC with Differential/Platelet  Result Value Ref Range   WBC 3.7 3.4 - 10.8 x10E3/uL   RBC 3.85 3.77 - 5.28 x10E6/uL   Hemoglobin 11.2 11.1 - 15.9 g/dL   Hematocrit 34.8 34.0 - 46.6 %   MCV 90 79 - 97 fL   MCH 29.1 26.6 - 33.0 pg   MCHC 32.2 31.5 - 35.7 g/dL   RDW 14.3 12.3 - 15.4 %   Platelets 220 150 - 379 x10E3/uL   Neutrophils 54 Not Estab. %   Lymphs 30 Not Estab. %   Monocytes 12 Not Estab. %   Eos 3 Not Estab. %   Basos 1 Not Estab. %   Neutrophils Absolute 2.0 1.4 - 7.0 x10E3/uL   Lymphocytes Absolute 1.1 0.7 - 3.1 x10E3/uL   Monocytes Absolute 0.4 0.1 - 0.9 x10E3/uL   EOS (ABSOLUTE) 0.1 0.0 - 0.4 x10E3/uL   Basophils Absolute 0.0 0.0 - 0.2 x10E3/uL   Immature Granulocytes 0 Not Estab. %   Immature Grans (Abs) 0.0 0.0 - 0.1 x10E3/uL  CMP14+EGFR  Result Value Ref Range   Glucose 125 (H) 65 - 99 mg/dL   BUN 18  8 - 27 mg/dL   Creatinine, Ser 0.96 0.57 - 1.00 mg/dL   GFR calc non Af Amer 58 (L) >59 mL/min/1.73   GFR calc Af Amer 67 >59 mL/min/1.73   BUN/Creatinine Ratio 19 12 - 28   Sodium 142 134 - 144 mmol/L   Potassium 4.1 3.5 - 5.2 mmol/L   Chloride 103 96 - 106 mmol/L   CO2 21 20 - 29 mmol/L   Calcium 10.0 8.7 - 10.3 mg/dL   Total Protein 7.3 6.0 - 8.5 g/dL   Albumin 4.6 3.5 - 4.8 g/dL   Globulin, Total 2.7 1.5 - 4.5 g/dL   Albumin/Globulin Ratio 1.7 1.2 - 2.2   Bilirubin Total 0.5 0.0 - 1.2 mg/dL   Alkaline Phosphatase 134 (H) 39 - 117 IU/L   AST 18 0 - 40 IU/L   ALT 11 0 - 32 IU/L  Lipid panel  Result Value Ref Range   Cholesterol, Total 97 (L) 100 - 199 mg/dL   Triglycerides 156 (H) 0 - 149 mg/dL   HDL 32 (L) >39 mg/dL   VLDL Cholesterol Cal 31 5 - 40 mg/dL   LDL Calculated 34 0 - 99 mg/dL   Chol/HDL Ratio 3.0 0.0 - 4.4 ratio  TSH  Result Value Ref Range   TSH 0.020 (L) 0.450 - 4.500 uIU/mL  Microalbumin / creatinine urine ratio  Result Value Ref Range   Creatinine, Urine 32.1 Not Estab. mg/dL   Albumin, Urine 3.7 Not Estab. ug/mL   Microalb/Creat Ratio 11.5 0.0 - 30.0 mg/g creat  Bayer DCA Hb A1c Waived  Result Value Ref Range   Bayer DCA Hb A1c Waived 6.5 <7.0 %      Assessment &  Plan:   1. Well female exam with routine gynecological exam - Pap IG (Image Guided)  2. Hypothyroidism, unspecified type - Thyroid Panel With TSH; Future  3. Type 2 diabetes mellitus without complication, without long-term current use of insulin (HCC) - Bayer DCA Hb A1c Waived; Future  4. Encounter for immunization - Flu vaccine HIGH DOSE PF    Current Outpatient Prescriptions:  .  ACCU-CHEK AVIVA PLUS test strip, USE TO CHECK BLOOD SUGAR ONCE A DAY OR AS INSTRUCTED, Disp: 50 each, Rfl: 2 .  Acetaminophen 500 MG coapsule, , Disp: , Rfl:  .  benzonatate (TESSALON) 200 MG capsule, Take 1 capsule (200 mg total) by mouth 2 (two) times daily as needed for cough., Disp: 20 capsule, Rfl:  0 .  cholecalciferol (VITAMIN D) 1000 UNITS tablet, Take 1,000 Units by mouth daily., Disp: , Rfl:  .  diltiazem (CARDIZEM CD) 120 MG 24 hr capsule, Take 1 capsule (120 mg total) by mouth daily., Disp: 90 capsule, Rfl: 3 .  diphenhydrAMINE (BENADRYL) 25 mg capsule, 25 mg as needed. , Disp: , Rfl:  .  escitalopram (LEXAPRO) 10 MG tablet, TAKE 1 TABLET DAILY (REPLACES CELEXA), Disp: 90 tablet, Rfl: 3 .  fexofenadine (ALLEGRA) 180 MG tablet, Take 1 tablet (180 mg total) by mouth daily. For allergy symptoms, Disp: 30 tablet, Rfl: 11 .  furosemide (LASIX) 40 MG tablet, Take 1 tablet (40 mg total) by mouth daily., Disp: 90 tablet, Rfl: 3 .  gabapentin (NEURONTIN) 100 MG capsule, Take 100 mg by mouth 3 (three) times daily. , Disp: , Rfl:  .  levothyroxine (SYNTHROID, LEVOTHROID) 125 MCG tablet, Take 1 tablet (125 mcg total) by mouth daily before breakfast., Disp: 90 tablet, Rfl: 0 .  lidocaine (XYLOCAINE) 2 % jelly, Apply 1 application topically 3 (three) times daily. Apply to hemorrhoid when painful, Disp: 30 mL, Rfl: 1 .  magnesium oxide (MAG-OX) 400 MG tablet, 400 mg., Disp: , Rfl:  .  potassium chloride SA (KLOR-CON M20) 20 MEQ tablet, Take 1 tablet (20 mEq total) by mouth daily., Disp: 90 tablet, Rfl: 3 .  PREMARIN vaginal cream, USING APPLICATOR PLACE 1 GRAM IN VAGINA FOR 3 WEEKS,THEN USE EVERY OTHER NIGHT AS INSTRUCTED, Disp: 30 g, Rfl: 4 .  PROCTOSOL HC 2.5 % rectal cream, APPLY TO RECTAL AREA EVERY 6 HOURS AS NEEDED FOR HEMORRHOIDS, Disp: 28.35 g, Rfl: 1 .  rosuvastatin (CRESTOR) 20 MG tablet, TAKE 1 TABLET DAILY, Disp: 90 tablet, Rfl: 0 .  sitaGLIPtin (JANUVIA) 100 MG tablet, Take 1 tablet (100 mg total) by mouth daily., Disp: 90 tablet, Rfl: 1 .  TETRAHYDROZOLINE HCL OP, Apply to eye. Reported on 01/02/2016, Disp: , Rfl:  .  XARELTO 15 MG TABS tablet, TAKE 1 TABLET (15 MG TOTAL) BY MOUTH DAILY WITH SUPPER., Disp: 90 tablet, Rfl: 0 Continue all other maintenance medications as listed  above.  Follow up plan: Return in about 6 months (around 10/16/2017) for recheck and labs.  Educational handout given for [place] PA-C [facility] [address]  [place], [place] [address] [phone]   04/17/2017, 4:27 PM

## 2017-04-17 NOTE — Patient Instructions (Signed)
In a few days you may receive a survey in the mail or online from [employer] regarding your visit with us today. Please take a moment to fill this out. Your feedback is very important to our whole office. It can help us better understand your needs as well as improve your experience and satisfaction. Thank you for taking your time to complete it. We care about you.  Tylynn Braniff, PA-C  

## 2017-04-18 LAB — PAP IG (IMAGE GUIDED): PAP SMEAR COMMENT: 0

## 2017-05-02 DIAGNOSIS — I4891 Unspecified atrial fibrillation: Secondary | ICD-10-CM | POA: Diagnosis not present

## 2017-05-02 DIAGNOSIS — Z45018 Encounter for adjustment and management of other part of cardiac pacemaker: Secondary | ICD-10-CM | POA: Diagnosis not present

## 2017-05-02 DIAGNOSIS — I480 Paroxysmal atrial fibrillation: Secondary | ICD-10-CM | POA: Diagnosis not present

## 2017-05-05 DIAGNOSIS — Z45018 Encounter for adjustment and management of other part of cardiac pacemaker: Secondary | ICD-10-CM | POA: Diagnosis not present

## 2017-05-05 DIAGNOSIS — I495 Sick sinus syndrome: Secondary | ICD-10-CM | POA: Diagnosis not present

## 2017-05-16 DIAGNOSIS — E039 Hypothyroidism, unspecified: Secondary | ICD-10-CM | POA: Diagnosis not present

## 2017-05-16 DIAGNOSIS — I495 Sick sinus syndrome: Secondary | ICD-10-CM | POA: Diagnosis not present

## 2017-05-16 DIAGNOSIS — Z79890 Hormone replacement therapy (postmenopausal): Secondary | ICD-10-CM | POA: Diagnosis not present

## 2017-05-16 DIAGNOSIS — K219 Gastro-esophageal reflux disease without esophagitis: Secondary | ICD-10-CM | POA: Diagnosis not present

## 2017-05-16 DIAGNOSIS — Z950 Presence of cardiac pacemaker: Secondary | ICD-10-CM | POA: Diagnosis not present

## 2017-05-16 DIAGNOSIS — I10 Essential (primary) hypertension: Secondary | ICD-10-CM | POA: Diagnosis not present

## 2017-05-16 DIAGNOSIS — I480 Paroxysmal atrial fibrillation: Secondary | ICD-10-CM | POA: Diagnosis not present

## 2017-05-16 DIAGNOSIS — I482 Chronic atrial fibrillation: Secondary | ICD-10-CM | POA: Diagnosis not present

## 2017-05-16 DIAGNOSIS — I4891 Unspecified atrial fibrillation: Secondary | ICD-10-CM | POA: Diagnosis not present

## 2017-05-16 DIAGNOSIS — Z7901 Long term (current) use of anticoagulants: Secondary | ICD-10-CM | POA: Diagnosis not present

## 2017-05-16 DIAGNOSIS — I340 Nonrheumatic mitral (valve) insufficiency: Secondary | ICD-10-CM | POA: Diagnosis not present

## 2017-06-09 ENCOUNTER — Other Ambulatory Visit: Payer: Self-pay | Admitting: Physician Assistant

## 2017-06-09 ENCOUNTER — Other Ambulatory Visit: Payer: Self-pay | Admitting: Family Medicine

## 2017-06-10 ENCOUNTER — Ambulatory Visit: Payer: Medicare Other | Admitting: *Deleted

## 2017-06-13 ENCOUNTER — Telehealth: Payer: Self-pay | Admitting: Physician Assistant

## 2017-06-13 NOTE — Telephone Encounter (Signed)
appt scheduled Pt notified 

## 2017-06-14 ENCOUNTER — Encounter: Payer: Self-pay | Admitting: Family Medicine

## 2017-06-14 ENCOUNTER — Ambulatory Visit (INDEPENDENT_AMBULATORY_CARE_PROVIDER_SITE_OTHER): Payer: Medicare Other

## 2017-06-14 ENCOUNTER — Ambulatory Visit (INDEPENDENT_AMBULATORY_CARE_PROVIDER_SITE_OTHER): Payer: Medicare Other | Admitting: Family Medicine

## 2017-06-14 VITALS — BP 104/63 | HR 90 | Temp 100.1°F | Ht 64.0 in | Wt 158.0 lb

## 2017-06-14 DIAGNOSIS — S6991XA Unspecified injury of right wrist, hand and finger(s), initial encounter: Secondary | ICD-10-CM | POA: Diagnosis not present

## 2017-06-14 DIAGNOSIS — J0100 Acute maxillary sinusitis, unspecified: Secondary | ICD-10-CM | POA: Diagnosis not present

## 2017-06-14 DIAGNOSIS — M25531 Pain in right wrist: Secondary | ICD-10-CM

## 2017-06-14 DIAGNOSIS — W19XXXA Unspecified fall, initial encounter: Secondary | ICD-10-CM

## 2017-06-14 MED ORDER — AMOXICILLIN-POT CLAVULANATE 875-125 MG PO TABS: 1.0000 | ORAL_TABLET | Freq: Two times a day (BID) | ORAL | 0 refills | Status: DC

## 2017-06-14 MED ORDER — ACETAMINOPHEN-CODEINE #3 300-30 MG PO TABS: 1.0000 | ORAL_TABLET | Freq: Four times a day (QID) | ORAL | 0 refills | Status: DC | PRN

## 2017-06-14 NOTE — Progress Notes (Signed)
Subjective: CC: fell yesterday; cough/ congestion PCP: Meredith Sleeper, PA-C [name] is a 76 y.o. female, who is accompanied today's visit by her daughter. She is presenting to clinic today for:  1. Fall/ wrist pain Patient reports that she fell earlier this week after slipping outside.  She denies preceding dizziness, chest pain, shortness of breath, visual disturbance, lower extremity weakness.  She notes that she landed on her bottom and did not hit her head.  She did reach out with her right upper extremity in order to brace herself and now has right-sided wrist pain.  She notes that pain is not severe but continues to ache despite home therapies.  She has been using Tylenol 500 mg every 6 hours with little improvement in symptoms.  No numbness, tingling, swelling or bruising appreciated.  Past medical history significant for atrial fibrillation requiring anticoagulation.  She is unable to tolerate NSAIDs for this reason.  2. Cold symptoms  Patient reports sinus pressure, cough, frontal headache, subjective fevers and chills that started 2 days ago.  She reports associated dental pain.  Denies hemoptysis, SOB, dizziness, rash, nausea, vomiting, diarrhea, myalgia, sick contacts, recent travel.  Patient has used Tylenol 500 mg every 6 hours with little relief of symptoms.  Denies history of COPD or asthma.  Denies tobacco use/ exposure.   ROS: Per HPI  Allergies  Allergen Reactions  . Dofetilide Other (See Comments)    Passed out  . Amlodipine     Other reaction(s): Other (See Comments) Swollen ankles  . Dronedarone Other (See Comments)    Increased BG  . Glipizide-Metformin Hcl     Other reaction(s): Diarrhea, Other (See Comments) Weakness  . Metformin     Other reaction(s): Diarrhea  . Niaspan [Niacin] Rash   Past Medical History:  Diagnosis Date  . A-fib ([place])   . Allergy    seasonal  . Anxiety   . Arthritis   . 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  Cataract   . Dyshydrosis   . Hemorrhoids   . Hyperlipidemia   . Hypertension   . Menopause   . NIDDM (non-insulin dependent diabetes mellitus)    diet controlled   . Osteopenia 2016  . Thyroid disease     Current Outpatient Medications:  .  ACCU-CHEK AVIVA PLUS test strip, USE TO CHECK BLOOD SUGAR ONCE A DAY OR AS INSTRUCTED, Disp: 50 each, Rfl: 2 .  Acetaminophen 500 MG coapsule, , Disp: , Rfl:  .  benzonatate (TESSALON) 200 MG capsule, Take 1 capsule (200 mg total) by mouth 2 (two) times daily as needed for cough., Disp: 20 capsule, Rfl: 0 .  cholecalciferol (VITAMIN D) 1000 UNITS tablet, Take 1,000 Units by mouth daily., Disp: , Rfl:  .  diltiazem (CARDIZEM CD) 120 MG 24 hr capsule, Take 1 capsule (120 mg total) by mouth daily., Disp: 90 capsule, Rfl: 3 .  diphenhydrAMINE (BENADRYL) 25 mg capsule, 25 mg as needed. , Disp: , Rfl:  .  escitalopram (LEXAPRO) 10 MG tablet, TAKE 1 TABLET DAILY (REPLACES CELEXA), Disp: 90 tablet, Rfl: 3 .  fexofenadine (ALLEGRA) 180 MG tablet, Take 1 tablet (180 mg total) by mouth daily. For allergy symptoms, Disp: 30 tablet, Rfl: 11 .  furosemide (LASIX) 40 MG tablet, Take 1 tablet (40 mg total) by mouth daily., Disp: 90 tablet, Rfl: 3 .  gabapentin (NEURONTIN) 100 MG capsule, Take 100 mg by mouth 3 (three) times daily. ,  Disp: , Rfl:  .  levothyroxine (SYNTHROID, LEVOTHROID) 125 MCG tablet, Take 1 tablet (125 mcg total) by mouth daily before breakfast., Disp: 90 tablet, Rfl: 0 .  lidocaine (XYLOCAINE) 2 % jelly, Apply 1 application topically 3 (three) times daily. Apply to hemorrhoid when painful, Disp: 30 mL, Rfl: 1 .  magnesium oxide (MAG-OX) 400 MG tablet, 400 mg., Disp: , Rfl:  .  potassium chloride SA (KLOR-CON M20) 20 MEQ tablet, Take 1 tablet (20 mEq total) by mouth daily., Disp: 90 tablet, Rfl: 3 .  PREMARIN vaginal cream, USING APPLICATOR PLACE 1 GRAM IN VAGINA FOR 3 WEEKS,THEN USE EVERY OTHER NIGHT AS INSTRUCTED, Disp: 30 g, Rfl: 4 .  PROCTOSOL  HC 2.5 % rectal cream, APPLY TO RECTAL AREA EVERY 6 HOURS AS NEEDED FOR HEMORRHOIDS, Disp: 28.35 g, Rfl: 1 .  rosuvastatin (CRESTOR) 20 MG tablet, TAKE 1 TABLET DAILY, Disp: 90 tablet, Rfl: 0 .  sitaGLIPtin (JANUVIA) 100 MG tablet, Take 1 tablet (100 mg total) by mouth daily., Disp: 90 tablet, Rfl: 1 .  TETRAHYDROZOLINE HCL OP, Apply to eye. Reported on 01/02/2016, Disp: , Rfl:  .  XARELTO 15 MG TABS tablet, TAKE 1 TABLET (15 MG TOTAL) BY MOUTH DAILY WITH SUPPER., Disp: 90 tablet, Rfl: 0 Social History   Socioeconomic History  . Marital status: Divorced    Spouse name: Not on file  . Number of children: Not on file  . Years of education: Not on file  . Highest education level: Not on file  Social Needs  . Financial resource strain: Not on file  . Food insecurity - worry: Not on file  . Food insecurity - inability: Not on file  . Transportation needs - medical: Not on file  . Transportation needs - non-medical: Not on file  Occupational History  . Not on file  Tobacco Use  . Smoking status: Never Smoker  . Smokeless tobacco: Never Used  Substance and Sexual Activity  . Alcohol use: No  . Drug use: No  . Sexual activity: No  Other Topics Concern  . Not on file  Social History Narrative  . Not on file   Family History  Problem Relation Age of Onset  . Stroke Mother   . Diabetes Mother   . Heart disease Father   . Atrial fibrillation Father   . Cancer Sister        ovarian / colon  . Diabetes Sister   . Heart disease Brother   . Hyperlipidemia Brother   . Hypertension Brother   . Diabetes Brother   . Heart attack Brother 59       had to perform emergency surgery  . Dementia Brother   . Hyperlipidemia Sister   . Cancer Sister        liver  . Hyperlipidemia Sister   . Diabetes Sister        [place] DM  . Cancer Sister   . Diabetes Sister   . Diabetes Daughter   . Rheum arthritis Daughter   . Psoriasis Daughter     Objective: Office vital signs reviewed. BP  104/63   Pulse 90   Temp 100.1 F (37.8 C) (Oral)   Ht 5\' 4"  (1.626 m)   Wt 158 lb (71.7 kg)   BMI 27.12 kg/m   Physical Examination:  General: Awake, alert, well appearing elderly female, No acute distress HEENT: [place]/AT; +TTP to maxillary sinuses    Neck: No masses palpated. No lymphadenopathy    Ears: Tympanic  membranes intact, normal light reflex, no erythema, no bulging    Eyes: PERRLA, extraocular membranes intact, sclera white    Nose: nasal turbinates moist, clear nasal discharge    Throat: moist mucus membranes, no erythema, no tonsillar exudate.  Airway is patent Cardio: regular rate and rhythm, S1S2 heard, no murmurs appreciated Pulm: clear to auscultation bilaterally, no wheezes, rhonchi or rales; normal work of breathing on room air Extremities: warm, well perfused, No edema, cyanosis or clubbing; +2 pulses bilaterally MSK:  Right wrist: Patient has full active range of motion in all planes.  No erythema, ecchymosis or swelling appreciated along the wrist.  She does have tenderness to palpation over the anatomic snuffbox.  No palpable bony abnormalities in this area. Skin: dry; intact; no rashes or lesions Neuro: UE light touch sensation grossly intact, follows commands.  Dg Wrist Complete Right  Result Date: 06/14/2017 CLINICAL DATA:  Fall several days ago with persistent right wrist pain, initial encounter EXAM: RIGHT WRIST - COMPLETE 3+ VIEW COMPARISON:  None. FINDINGS: There is no evidence of fracture or dislocation. There is no evidence of arthropathy or other focal bone abnormality. Soft tissues are unremarkable. IMPRESSION: No acute abnormality noted. Electronically Signed   By: Inez Catalina M.D.   On: 06/14/2017 16:10    Assessment/ Plan: 76 y.o. female   1. Right wrist pain Patient's physical exam was notable for tenderness over the anatomic snuffbox.  No significant bruising or palpable abnormalities were appreciated.  X-ray was obtained which did not reveal  scaphoid fracture or any other acute processes.  We will treat conservatively as a wrist sprain.  Continue ice and heat as needed.  Tylenol 500 mg not improving pain.  She is unable to tolerate oral NSAIDs given current anticoagulated status.  We will treat with Tylenol with codeine every 6 hours as needed severe pain.  I did review with the patient and her daughter at length that codeine may be sedating and increase her risk of falls.  I did recommend that she take this medication with extreme caution and avoid use should she develop any worrisome symptoms or signs.  They voiced good understanding and will follow-up as needed. - DG Wrist Complete Right; Future  2. Fall, initial encounter I reviewed need to reduce falls at home.  Handout was provided.  3. Acute non-recurrent maxillary sinusitis Patient does have a low-grade fever here in office.  Given her age and other comorbidities I find this to be significant.  Her physical exam was significant for maxillary tenderness to palpation.  She also had mucosal edema of the nasal turbinates.  No purulence was appreciated.  Will empirically treat with oral Augmentin twice daily times 10 days.  Home care instructions were reviewed with the patient and a handout was provided. Strict return precautions and reasons for emergent evaluation in the emergency department review with patient.  They voiced understanding and will follow-up as needed.  Orders Placed This Encounter  Procedures  . DG Wrist Complete Right    Standing Status:   Future    Number of Occurrences:   1    Standing Expiration Date:   08/15/2018    Order Specific Question:   Reason for Exam (SYMPTOM  OR DIAGNOSIS REQUIRED)    Answer:   fall w/ right wrist pain.  on anticoagulation.    Order Specific Question:   Preferred imaging location?    Answer:   Internal    Order Specific Question:   Radiology Contrast Protocol -  do NOT remove file path    Answer:    [email or web address]   Meds ordered this encounter  Medications  . amoxicillin-clavulanate (AUGMENTIN) 875-125 MG tablet    Sig: Take 1 tablet by mouth 2 (two) times daily.    Dispense:  20 tablet    Refill:  0  . acetaminophen-codeine (TYLENOL #3) 300-30 MG tablet    Sig: Take 1 tablet by mouth every 6 (six) hours as needed for severe pain.    Dispense:  12 tablet    Refill:  [place], DO [place] [phone]

## 2017-06-14 NOTE — Patient Instructions (Addendum)
You had an xray of your wrist today that showed no evidence of fracture.  I think that you probably have a mild wrist sprain.  Because you are unable to take oral NSAIDs, I have given you Tylenol with codeine.  Be careful with this medication as it can cause sedation and increase you risk of falls.  If you feel short of breath, dizzy, lightheaded or foggy brain while taking medication please discontinue.  Be mindful of how much Tylenol you are taking during the day while on this medication so as not to take too much.  You may continue the benzonatate if you needed for the coughing.  I have also prescribed you Augmentin to clear up your sinus infection.  Follow-up with your primary care doctor if your symptoms are not getting better.    It appears that you have a viral upper respiratory infection (cold).  Cold symptoms can last up to 2 weeks.  I recommend that you only use cold medications that are safe in high blood pressure like Coricidin (generic is fine).  Other cold medications can increase your blood pressure.    - Get plenty of rest and drink plenty of fluids. - Try to breathe moist air. Use a cold mist humidifier. - Consume warm fluids (soup or tea) to provide relief for a stuffy nose and to loosen phlegm. - For nasal stuffiness, try saline nasal spray or a Neti Pot.  Afrin nasal spray can also be used but this product should not be used longer than 3 days or it will cause rebound nasal stuffiness (worsening nasal congestion). - For sore throat pain relief: suck on throat lozenges, hard candy or popsicles; gargle with warm salt water (1/4 tsp. salt per 8 oz. of water); and eat soft, bland foods. - Eat a well-balanced diet. If you cannot, ensure you are getting enough nutrients by taking a daily multivitamin. - Avoid dairy products, as they can thicken phlegm. - Avoid alcohol, as it impairs your body's immune system.  CONTACT YOUR DOCTOR IF YOU EXPERIENCE ANY OF THE FOLLOWING: - High fever -  Ear pain - Sinus-type headache - Unusually severe cold symptoms - Cough that gets worse while other cold symptoms improve - Flare up of any chronic lung problem, such as asthma - Your symptoms persist longer than 2 weeks   Fall Prevention in the Home Falls can cause injuries. They can happen to people of all ages. There are many things you can do to make your home safe and to help prevent falls. What can I do on the outside of my home?  Regularly fix the edges of walkways and driveways and fix any cracks.  Remove anything that might make you trip as you walk through a door, such as a raised step or threshold.  Trim any bushes or trees on the path to your home.  Use bright outdoor lighting.  Clear any walking paths of anything that might make someone trip, such as rocks or tools.  Regularly check to see if handrails are loose or broken. Make sure that both sides of any steps have handrails.  Any raised decks and porches should have guardrails on the edges.  Have any leaves, snow, or ice cleared regularly.  Use sand or salt on walking paths during winter.  Clean up any spills in your garage right away. This includes oil or grease spills. What can I do in the bathroom?  Use night lights.  Install grab bars by the toilet and  in the tub and shower. Do not use towel bars as grab bars.  Use non-skid mats or decals in the tub or shower.  If you need to sit down in the shower, use a plastic, non-slip stool.  Keep the floor dry. Clean up any water that spills on the floor as soon as it happens.  Remove soap buildup in the tub or shower regularly.  Attach bath mats securely with double-sided non-slip rug tape.  Do not have throw rugs and other things on the floor that can make you trip. What can I do in the bedroom?  Use night lights.  Make sure that you have a light by your bed that is easy to reach.  Do not use any sheets or blankets that are too big for your bed. They  should not hang down onto the floor.  Have a firm chair that has side arms. You can use this for support while you get dressed.  Do not have throw rugs and other things on the floor that can make you trip. What can I do in the kitchen?  Clean up any spills right away.  Avoid walking on wet floors.  Keep items that you use a lot in easy-to-reach places.  If you need to reach something above you, use a strong step stool that has a grab bar.  Keep electrical cords out of the way.  Do not use floor polish or wax that makes floors slippery. If you must use wax, use non-skid floor wax.  Do not have throw rugs and other things on the floor that can make you trip. What can I do with my stairs?  Do not leave any items on the stairs.  Make sure that there are handrails on both sides of the stairs and use them. Fix handrails that are broken or loose. Make sure that handrails are as long as the stairways.  Check any carpeting to make sure that it is firmly attached to the stairs. Fix any carpet that is loose or worn.  Avoid having throw rugs at the top or bottom of the stairs. If you do have throw rugs, attach them to the floor with carpet tape.  Make sure that you have a light switch at the top of the stairs and the bottom of the stairs. If you do not have them, ask someone to add them for you. What else can I do to help prevent falls?  Wear shoes that: ? Do not have high heels. ? Have rubber bottoms. ? Are comfortable and fit you well. ? Are closed at the toe. Do not wear sandals.  If you use a stepladder: ? Make sure that it is fully opened. Do not climb a closed stepladder. ? Make sure that both sides of the stepladder are locked into place. ? Ask someone to hold it for you, if possible.  Clearly mark and make sure that you can see: ? Any grab bars or handrails. ? First and last steps. ? Where the edge of each step is.  Use tools that help you move around (mobility aids) if  they are needed. These include: ? Canes. ? Walkers. ? Scooters. ? Crutches.  Turn on the lights when you go into a dark area. Replace any light bulbs as soon as they burn out.  Set up your furniture so you have a clear path. Avoid moving your furniture around.  If any of your floors are uneven, fix them.  If there are  any pets around you, be aware of where they are.  Review your medicines with your doctor. Some medicines can make you feel dizzy. This can increase your chance of falling. Ask your doctor what other things that you can do to help prevent falls. This information is not intended to replace advice given to you by your health care provider. Make sure you discuss any questions you have with your health care provider. Document Released: 04/14/2009 Document Revised: 11/24/2015 Document Reviewed: 07/23/2014 Elsevier Interactive Patient Education  [employer].

## 2017-06-17 ENCOUNTER — Telehealth: Payer: Self-pay | Admitting: Physician Assistant

## 2017-06-17 MED ORDER — DOXYCYCLINE HYCLATE 100 MG PO TABS: 100.0000 mg | ORAL_TABLET | Freq: Two times a day (BID) | ORAL | 0 refills | Status: DC

## 2017-06-17 NOTE — Telephone Encounter (Signed)
Patient aware of recommendation and medication changes

## 2017-06-22 ENCOUNTER — Other Ambulatory Visit: Payer: Self-pay | Admitting: Physician Assistant

## 2017-06-28 ENCOUNTER — Ambulatory Visit (INDEPENDENT_AMBULATORY_CARE_PROVIDER_SITE_OTHER): Payer: Medicare Other | Admitting: *Deleted

## 2017-06-28 VITALS — BP 108/65 | HR 82 | Temp 97.7°F | Ht 64.0 in | Wt 157.8 lb

## 2017-06-28 DIAGNOSIS — Z0000 Encounter for general adult medical examination without abnormal findings: Secondary | ICD-10-CM | POA: Diagnosis not present

## 2017-06-28 NOTE — Progress Notes (Signed)
Subjective:   Meredith Anderson is a 76 y.o. female who presents for Medicare Annual (Subsequent) preventive examination. Meredith Anderson live at home alone with 10 outside cats.  She has 2 daughters, 1 that lives close by and 2 grandchildren that lives close by and visits often.  Meredith Anderson enjoys listening to music.  Her meals consist of cereal, sandwiches, nabs, and soup.  She also drinks about 2 bottles of soda daily and 2 bottles of water daily.  Meredith Anderson states that she has been in the hospital twice this year one due to a fall and the other for a heart ablation.   Overall Mrs Anderson states that her health is about the same as is it was a year ago.    Objective:     Vitals: BP 108/65   Pulse 82   Temp 97.7 F (36.5 C) (Oral)   Ht 5\' 4"  (1.626 m)   Wt 157 lb 12.8 oz (71.6 kg)   BMI 27.09 kg/m   Body mass index is 27.09 kg/m.  Advanced Directives 05/23/2016 05/20/2015 05/17/2014  Does Patient Have a Medical Advance Directive? No No No  Would patient like information on creating a medical advance directive? No - Patient declined No - patient declined information No - patient declined information    Tobacco Social History   Tobacco Use  Smoking Status Never Smoker  Smokeless Tobacco Never Used     No Tobacco Use    Past Medical History:  Diagnosis Date  . A-fib ([place])   . Allergy    seasonal  . Anxiety   . Arthritis   . 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 Cataract   . Depression 12/12/2015  . Dyshydrosis   . Hemorrhoids   . Hyperlipidemia   . Hypertension   . Menopause   . NIDDM (non-insulin dependent diabetes mellitus)    diet controlled   . Osteopenia 2016  . Thyroid disease    Past Surgical History:  Procedure Laterality Date  . ABDOMINAL HYSTERECTOMY     partial, prolapse  . ABLATION    . bunionectomy bilateral    . EYE SURGERY Bilateral    cataracts  . LUMBAR DISC SURGERY     L4 and L5  . PACEMAKER INSERTION  12/14/2015  . ROTATOR CUFF REPAIR  Right   . TUBAL LIGATION     Family History  Problem Relation Age of Onset  . Stroke Mother   . Diabetes Mother   . Heart disease Father   . Atrial fibrillation Father   . Cancer Sister        ovarian / colon  . Diabetes Sister   . Heart disease Brother   . Hyperlipidemia Brother   . Hypertension Brother   . Diabetes Brother   . Heart attack Brother 63       had to perform emergency surgery  . Dementia Brother   . Hyperlipidemia Sister   . Cancer Sister        liver  . Hyperlipidemia Sister   . Diabetes Sister        [place] DM  . Cancer Sister   . Diabetes Sister   . Diabetes Daughter   . Rheum arthritis Daughter   . Psoriasis Daughter    Social History   Socioeconomic History  . Marital status: Divorced    Spouse name: None  . Number of children: None  . Years of education: None  .  Highest education level: None  Social Needs  . Financial resource strain: None  . Food insecurity - worry: None  . Food insecurity - inability: None  . Transportation needs - medical: None  . Transportation needs - non-medical: None  Occupational History  . None  Tobacco Use  . Smoking status: Never Smoker  . Smokeless tobacco: Never Used  Substance and Sexual Activity  . Alcohol use: No  . Drug use: No  . Sexual activity: No  Other Topics Concern  . None  Social History Narrative  . None    Outpatient Encounter Medications as of 06/28/2017  Medication Sig  . ACCU-CHEK AVIVA PLUS test strip USE TO CHECK BLOOD SUGAR ONCE A DAY OR AS INSTRUCTED  . Acetaminophen 500 MG coapsule   . cholecalciferol (VITAMIN D) 1000 UNITS tablet Take 1,000 Units by mouth daily.  Marland Kitchen diltiazem (CARDIZEM CD) 120 MG 24 hr capsule Take 1 capsule (120 mg total) by mouth daily.  . diphenhydrAMINE (BENADRYL) 25 mg capsule 25 mg as needed.   Marland Kitchen escitalopram (LEXAPRO) 10 MG tablet TAKE 1 TABLET DAILY (REPLACES CELEXA)  . fexofenadine (ALLEGRA) 180 MG tablet Take 1 tablet (180 mg total) by mouth  daily. For allergy symptoms  . furosemide (LASIX) 40 MG tablet Take 1 tablet (40 mg total) by mouth daily.  Marland Kitchen gabapentin (NEURONTIN) 100 MG capsule Take 100 mg by mouth 3 (three) times daily.   Marland Kitchen levothyroxine (SYNTHROID, LEVOTHROID) 125 MCG tablet TAKE 1 TABLET (125 MCG TOTAL) BY MOUTH DAILY BEFORE BREAKFAST.  Marland Kitchen lidocaine (XYLOCAINE) 2 % jelly Apply 1 application topically 3 (three) times daily. Apply to hemorrhoid when painful  . magnesium oxide (MAG-OX) 400 MG tablet 400 mg.  . potassium chloride SA (KLOR-CON M20) 20 MEQ tablet Take 1 tablet (20 mEq total) by mouth daily.  Marland Kitchen PREMARIN vaginal cream USING APPLICATOR PLACE 1 GRAM IN VAGINA FOR 3 WEEKS,THEN USE EVERY OTHER NIGHT AS INSTRUCTED  . PROCTOSOL HC 2.5 % rectal cream APPLY TO RECTAL AREA EVERY 6 HOURS AS NEEDED FOR HEMORRHOIDS  . rosuvastatin (CRESTOR) 20 MG tablet TAKE 1 TABLET DAILY  . sitaGLIPtin (JANUVIA) 100 MG tablet Take 1 tablet (100 mg total) by mouth daily.  . TETRAHYDROZOLINE HCL OP Apply to eye. Reported on 01/02/2016  . XARELTO 15 MG TABS tablet TAKE 1 TABLET (15 MG TOTAL) BY MOUTH DAILY WITH SUPPER.  Marland Kitchen acetaminophen-codeine (TYLENOL #3) 300-30 MG tablet Take 1 tablet by mouth every 6 (six) hours as needed for severe pain. (Patient not taking: Reported on 06/28/2017)  . benzonatate (TESSALON) 200 MG capsule Take 1 capsule (200 mg total) by mouth 2 (two) times daily as needed for cough. (Patient not taking: Reported on 06/28/2017)  . [DISCONTINUED] doxycycline (VIBRA-TABS) 100 MG tablet Take 1 tablet (100 mg total) by mouth 2 (two) times daily.   No facility-administered encounter medications on file as of 06/28/2017.     Activities of Daily Living In your present state of health, do you have any difficulty performing the following activities: 06/28/2017  Hearing? N  Vision? Y  Comment Wear Glasses to read  Difficulty concentrating or making decisions? N  Walking or climbing stairs? Y  Comment Trouble with Stairs    Dressing or bathing? N  Doing errands, shopping? N  Some recent data might be hidden  Patient has some difficulties walking and climbing stairs Has some trouble seeing up close with out glasses  Patient Care Team: Theodoro Clock as PCP - General (Physician  Assistant) Frederik Pear, MD as Consulting Physician (Internal Medicine) Wyvonnia Dusky, MD as Consulting Physician (Cardiology) Griselda Miner, MD as Consulting Physician (Dermatology)    Assessment:   This is a routine wellness examination for Carlisia.  Exercise Activities and Dietary recommendations Current Exercise Habits: The patient does not participate in regular exercise at present, Exercise limited by: orthopedic condition(s)  Goals    None      Fall Risk Fall Risk  06/28/2017 06/14/2017 04/17/2017 04/05/2017 03/18/2017  Falls in the past year? No No Yes No Yes  Number falls in past yr: - - 2 or more - 2 or more  Comment - - - - -  Injury with Fall? - - Yes - Yes  Risk Factor Category  - - High Fall Risk - -  Risk for fall due to : - - - - -  Follow up - - Falls prevention discussed - Falls prevention discussed   Is the patient's home free of loose throw rugs in walkways, pet beds, electrical cords, etc?   yes      Grab bars in the bathroom? yes      Handrails on the stairs?   yes      Adequate lighting?   yes   Depression Screen PHQ 2/9 Scores 06/28/2017 06/14/2017 04/17/2017 04/05/2017  PHQ - 2 Score 0 0 0 0     Cognitive Function MMSE - Mini Mental State Exam 06/28/2017 05/23/2016 05/20/2015  Orientation to time 5 5 5   Orientation to Place 5 5 5   Registration 3 3 3   Attention/ Calculation 5 3 5   Recall 1 3 3   Language- name 2 objects 2 2 2   Language- repeat 1 1 1   Language- follow 3 step command 3 3 3   Language- read & follow direction 1 1 1   Write a sentence 1 1 1   Copy design 1 1 1   Total score 28 28 30     No definite memory loss at this time    Immunization History   Administered Date(s) Administered  . Influenza Whole 04/01/2012  . Influenza, High Dose Seasonal PF 04/17/2017  . Influenza,inj,Quad PF,6+ Mos 04/03/2013, 04/03/2016  . Influenza-Unspecified 03/15/2014  . Pneumococcal Conjugate-13 05/17/2014  . Pneumococcal Polysaccharide-23 08/02/2009  . Tdap 01/31/2012  . Zoster 01/31/2012  Declines Shingrix at this time  Screening Tests Health Maintenance  Topic Date Due  . OPHTHALMOLOGY EXAM  06/28/2018 (Originally 05/16/2017)  . DEXA SCAN  06/28/2018 (Originally 08/11/2016)  . HEMOGLOBIN A1C  09/19/2017  . FOOT EXAM  10/17/2017  . URINE MICROALBUMIN  03/22/2018  . TETANUS/TDAP  01/30/2022  . INFLUENZA VACCINE  Completed  . PNA vac Low Risk Adult  Completed  Declines Dexa Scan at this time  Cancer Screenings: Lung: Low Dose CT Chest recommended if Age 10-80 years, 30 pack-year currently smoking OR have quit w/in 15years. Patient does not qualify. Breast:  Up to date on Mammogram? Yes   Up to date of Bone Density/Dexa? No Colorectal:   Additional Screenings: Hepatitis B/HIV/Syphillis: Hepatitis C Screening:      Plan:   Encouraged to eat 3 meals daily that consist of protein, fruits and vegetables.  Encouraged to increase water intake. Encouraged to exercise at least 30 minutes 3 times weekly.  Advance directive packet given and encouraged to look over and discuss with family.  Encouraged to keep follow up appointment with Particia Nearing, PA on 10/24/2017.  Also encouraged to follow up with Cardiologist as needed..   I have  personally reviewed and noted the following in the patient's chart:   . Medical and social history . Use of alcohol, tobacco or illicit drugs  . Current medications and supplements . Functional ability and status . Nutritional status . Physical activity . Advanced directives . List of other physicians . Hospitalizations, surgeries, and ER visits in previous 12 months . Vitals . Screenings to include cognitive,  depression, and falls . Referrals and appointments  In addition, I have reviewed and discussed with patient certain preventive protocols, quality metrics, and best practice recommendations. A written personalized care plan for preventive services as well as general preventive health recommendations were provided to patient.     Wardell Heath, LPN  [id number]/0233  I have reviewed and agree with the above AWV documentation.   Terald Sleeper PA-C [facility] [address]  [place], [place] [address] [phone]

## 2017-06-28 NOTE — Patient Instructions (Addendum)
Eat 3 meals daily that consist of proteins, fruits and vegetables Increase Water Intake Try to exercise at least 30 minutes 3 times weekly Keep Follow up appointment with Particia Nearing, PA Look over Advance Directive Packet   Ms. Keithley , Thank you for taking time to come for your Medicare Wellness Visit. I appreciate your ongoing commitment to your health goals. Please review the following plan we discussed and let me know if I can assist you in the future.   These are the goals we discussed: Goals    None      This is a list of the screening recommended for you and due dates:  Health Maintenance  Topic Date Due  . DEXA scan (bone density measurement)  08/11/2016  . Eye exam for diabetics  05/16/2017  . Hemoglobin A1C  09/19/2017  . Complete foot exam   10/17/2017  . Urine Protein Check  03/22/2018  . Tetanus Vaccine  01/30/2022  . Flu Shot  Completed  . Pneumonia vaccines  Completed     Advance Directive Advance directives are legal documents that let you make choices ahead of time about your health care and medical treatment in case you become unable to communicate for yourself. Advance directives are a way for you to communicate your wishes to family, friends, and health care providers. This can help convey your decisions about end-of-life care if you become unable to communicate. Discussing and writing advance directives should happen over time rather than all at once. Advance directives can be changed depending on your situation and what you want, even after you have signed the advance directives. If you do not have an advance directive, some states assign family decision makers to act on your behalf based on how closely you are related to them. Each state has its own laws regarding advance directives. You may want to check with your health care provider, attorney, or state representative about the laws in your state. There are different types of advance directives, such  as:  Medical power of attorney.  Living will.  Do not resuscitate (DNR) or do not attempt resuscitation (DNAR) order.  Health care proxy and medical power of attorney A health care proxy, also called a health care agent, is a person who is appointed to make medical decisions for you in cases in which you are unable to make the decisions yourself. Generally, people choose someone they know well and trust to represent their preferences. Make sure to ask this person for an agreement to act as your proxy. A proxy may have to exercise judgment in the event of a medical decision for which your wishes are not known. A medical power of attorney is a legal document that names your health care proxy. Depending on the laws in your state, after the document is written, it may also need to be:  Signed.  Notarized.  Dated.  Copied.  Witnessed.  Incorporated into your medical record.  You may also want to appoint someone to manage your financial affairs in a situation in which you are unable to do so. This is called a durable power of attorney for finances. It is a separate legal document from the durable power of attorney for health care. You may choose the same person or someone different from your health care proxy to act as your agent in financial matters. If you do not appoint a proxy, or if there is a concern that the proxy is not acting in your best interests,  a court-appointed guardian may be designated to act on your behalf. Living will A living will is a set of instructions documenting your wishes about medical care when you cannot express them yourself. Health care providers should keep a copy of your living will in your medical record. You may want to give a copy to family members or friends. To alert caregivers in case of an emergency, you can place a card in your wallet to let them know that you have a living will and where they can find it. A living will is used if you  become:  Terminally ill.  Incapacitated.  Unable to communicate or make decisions.  Items to consider in your living will include:  The use or non-use of life-sustaining equipment, such as dialysis machines and breathing machines (ventilators).  A DNR or DNAR order, which is the instruction not to use cardiopulmonary resuscitation (CPR) if breathing or heartbeat stops.  The use or non-use of tube feeding.  Withholding of food and fluids.  Comfort (palliative) care when the goal becomes comfort rather than a cure.  Organ and tissue donation.  A living will does not give instructions for distributing your money and property if you should pass away. It is recommended that you seek the advice of a lawyer when writing a will. Decisions about taxes, beneficiaries, and asset distribution will be legally binding. This process can relieve your family and friends of any concerns surrounding disputes or questions that may come up about the distribution of your assets. DNR or DNAR A DNR or DNAR order is a request not to have CPR in the event that your heart stops beating or you stop breathing. If a DNR or DNAR order has not been made and shared, a health care provider will try to help any patient whose heart has stopped or who has stopped breathing. If you plan to have surgery, talk with your health care provider about how your DNR or DNAR order will be followed if problems occur. Summary  Advance directives are the legal documents that allow you to make choices ahead of time about your health care and medical treatment in case you become unable to communicate for yourself.  The process of discussing and writing advance directives should happen over time. You can change the advance directives, even after you have signed them.  Advance directives include DNR or DNAR orders, living wills, and designating an agent as your medical power of attorney. This information is not intended to replace advice  given to you by your health care provider. Make sure you discuss any questions you have with your health care provider. Document Released: 09/25/2007 Document Revised: 05/07/2016 Document Reviewed: 05/07/2016 Elsevier Interactive Patient Education  2017 [place] Exercise The sit-to-stand exercise (also known as the chair stand or chair rise exercise) strengthens your lower body and helps you maintain or improve your mobility and independence. The goal is to do the sit-to-stand exercise without using your hands. This will be easier as you become stronger. You should always talk with your health care provider before starting any exercise program, especially if you have had recent surgery. Do the exercise exactly as told by your health care provider and adjust it as directed. It is normal to feel mild stretching, pulling, tightness, or discomfort as you do this exercise, but you should stop right away if you feel sudden pain or your pain gets worse. Do not begin doing this exercise until told by your  health care provider. What the sit-to-stand exercise does The sit-to-stand exercise helps to strengthen the muscles in your thighs and the muscles in the center of your body that give you stability (core muscles). This exercise is especially helpful if:  You have had knee or hip surgery.  You have trouble getting up from a chair, out of a car, or off the toilet.  How to do the sit-to-stand exercise 1. Sit toward the front edge of a sturdy chair without armrests. Your knees should be bent and your feet should be flat on the floor and shoulder-width apart. 2. Place your hands lightly on each side of the seat. Keep your back and neck as straight as possible, with your chest slightly forward. 3. Breathe in slowly. Lean forward and slightly shift your weight to the front of your feet. 4. Breathe out as you slowly stand up. Use your hands as little as possible. 5. Stand and pause for a  full breath in and out. 6. Breathe in as you sit down slowly. Tighten your core and abdominal muscles to control your lowering as much as possible. 7. Breathe out slowly. 8. Do this exercise 10-15 times. If needed, do it fewer times until you build up strength. 9. Rest for 1 minute, then do another set of 10-15 repetitions. To change the difficulty of the sit-to-stand exercise  If the exercise is too difficult, use a chair with sturdy armrests, and push off the armrests to help you come to the standing position. You can also use the armrests to help slowly lower yourself back to sitting. As this gets easier, try to use your arms less. You can also place a firm cushion or pillow on the chair to make the surface higher.  If this exercise is too easy, do not use your arms to help raise or lower yourself. You can also wear a weighted vest, use hand weights, increase your repetitions, or try a lower chair. General tips  You may feel tired when starting an exercise routine. This is normal.  You may have muscle soreness that lasts a few days. This is normal. As you get stronger, you may not feel muscle soreness.  Use smooth, steady movements.  Do not  hold your breath during strength exercises. This can cause unsafe changes in your blood pressure.  Breathe in slowly through your nose, and breathe out slowly through your mouth. Summary  Strengthening your lower body is an important step to help you move safely and independently.  The sit-to-stand exercise helps strengthen the muscles in your thighs and core.  You should always talk with your health care provider before starting any exercise program, especially if you have had recent surgery. This information is not intended to replace advice given to you by your health care provider. Make sure you discuss any questions you have with your health care provider. Document Released: 08/09/2016 Document Revised: 08/09/2016 Document Reviewed:  08/09/2016 Elsevier Interactive Patient Education  2018 [place] (AHA) Exercise Recommendation  Being physically active is important to prevent heart disease and stroke, the nation's No. 1and No. 5killers. To improve overall cardiovascular health, we suggest at least 150 minutes per week of moderate exercise or 75 minutes per week of vigorous exercise (or a combination of moderate and vigorous activity). Thirty minutes a day, five times a week is an easy goal to remember. You will also experience benefits even if you divide your time into two or three segments of 10  to 15 minutes per day.  For people who would benefit from lowering their blood pressure or cholesterol, we recommend 40 minutes of aerobic exercise of moderate to vigorous intensity three to four times a week to lower the risk for heart attack and stroke.  Physical activity is anything that makes you move your body and burn calories.  This includes things like climbing stairs or playing sports. Aerobic exercises benefit your heart, and include walking, jogging, swimming or biking. Strength and stretching exercises are best for overall stamina and flexibility.  The simplest, positive change you can make to effectively improve your heart health is to start walking. It's enjoyable, free, easy, social and great exercise. A walking program is flexible and boasts high success rates because people can stick with it. It's easy for walking to become a regular and satisfying part of life.   For Overall Cardiovascular Health:  At least 30 minutes of moderate-intensity aerobic activity at least 5 days per week for a total of 150  OR   At least 25 minutes of vigorous aerobic activity at least 3 days per week for a total of 75 minutes; or a combination of moderate- and vigorous-intensity aerobic activity  AND   Moderate- to high-intensity muscle-strengthening activity at least 2 days per week for additional  health benefits.  For Lowering Blood Pressure and Cholesterol  An average 40 minutes of moderate- to vigorous-intensity aerobic activity 3 or 4 times per week  What if I can't make it to the time goal? Something is always better than nothing! And everyone has to start somewhere. Even if you've been sedentary for years, today is the day you can begin to make healthy changes in your life. If you don't think you'll make it for 30 or 40 minutes, set a reachable goal for today. You can work up toward your overall goal by increasing your time as you get stronger. Don't let all-or-nothing thinking rob you of doing what you can every day.  Source:http://www.heart.org

## 2017-07-04 ENCOUNTER — Other Ambulatory Visit: Payer: Self-pay | Admitting: Physician Assistant

## 2017-07-04 MED ORDER — GABAPENTIN 100 MG PO CAPS: 100.0000 mg | ORAL_CAPSULE | Freq: Three times a day (TID) | ORAL | 0 refills | Status: DC

## 2017-07-05 ENCOUNTER — Encounter: Payer: Self-pay | Admitting: *Deleted

## 2017-07-08 ENCOUNTER — Encounter: Payer: Self-pay | Admitting: Physician Assistant

## 2017-07-08 DIAGNOSIS — Z1231 Encounter for screening mammogram for malignant neoplasm of breast: Secondary | ICD-10-CM | POA: Diagnosis not present

## 2017-08-02 DIAGNOSIS — I495 Sick sinus syndrome: Secondary | ICD-10-CM | POA: Diagnosis not present

## 2017-08-02 DIAGNOSIS — Z45010 Encounter for checking and testing of cardiac pacemaker pulse generator [battery]: Secondary | ICD-10-CM | POA: Diagnosis not present

## 2017-08-05 DIAGNOSIS — I495 Sick sinus syndrome: Secondary | ICD-10-CM | POA: Diagnosis not present

## 2017-08-05 DIAGNOSIS — Z45018 Encounter for adjustment and management of other part of cardiac pacemaker: Secondary | ICD-10-CM | POA: Diagnosis not present

## 2017-08-30 ENCOUNTER — Other Ambulatory Visit: Payer: Self-pay | Admitting: Physician Assistant

## 2017-09-04 DIAGNOSIS — Z9889 Other specified postprocedural states: Secondary | ICD-10-CM | POA: Diagnosis not present

## 2017-09-04 DIAGNOSIS — I495 Sick sinus syndrome: Secondary | ICD-10-CM | POA: Diagnosis not present

## 2017-09-04 DIAGNOSIS — I480 Paroxysmal atrial fibrillation: Secondary | ICD-10-CM | POA: Diagnosis not present

## 2017-09-04 DIAGNOSIS — E119 Type 2 diabetes mellitus without complications: Secondary | ICD-10-CM | POA: Diagnosis not present

## 2017-09-04 DIAGNOSIS — Z79899 Other long term (current) drug therapy: Secondary | ICD-10-CM | POA: Diagnosis not present

## 2017-09-04 DIAGNOSIS — I10 Essential (primary) hypertension: Secondary | ICD-10-CM | POA: Diagnosis not present

## 2017-09-04 DIAGNOSIS — E039 Hypothyroidism, unspecified: Secondary | ICD-10-CM | POA: Diagnosis not present

## 2017-09-04 DIAGNOSIS — Z950 Presence of cardiac pacemaker: Secondary | ICD-10-CM | POA: Diagnosis not present

## 2017-09-04 DIAGNOSIS — E785 Hyperlipidemia, unspecified: Secondary | ICD-10-CM | POA: Diagnosis not present

## 2017-09-04 DIAGNOSIS — Z7901 Long term (current) use of anticoagulants: Secondary | ICD-10-CM | POA: Diagnosis not present

## 2017-09-08 ENCOUNTER — Other Ambulatory Visit: Payer: Self-pay | Admitting: Physician Assistant

## 2017-10-24 ENCOUNTER — Ambulatory Visit: Payer: Medicare Other | Admitting: Physician Assistant

## 2017-10-30 ENCOUNTER — Encounter: Payer: Self-pay | Admitting: Physician Assistant

## 2017-10-30 ENCOUNTER — Ambulatory Visit (INDEPENDENT_AMBULATORY_CARE_PROVIDER_SITE_OTHER): Payer: Medicare Other | Admitting: Physician Assistant

## 2017-10-30 VITALS — BP 139/82 | HR 84 | Ht 64.0 in | Wt 159.0 lb

## 2017-10-30 DIAGNOSIS — E039 Hypothyroidism, unspecified: Secondary | ICD-10-CM

## 2017-10-30 DIAGNOSIS — I495 Sick sinus syndrome: Secondary | ICD-10-CM | POA: Diagnosis not present

## 2017-10-30 DIAGNOSIS — I10 Essential (primary) hypertension: Secondary | ICD-10-CM | POA: Diagnosis not present

## 2017-10-30 DIAGNOSIS — E119 Type 2 diabetes mellitus without complications: Secondary | ICD-10-CM | POA: Diagnosis not present

## 2017-10-30 LAB — BAYER DCA HB A1C WAIVED: HB A1C (BAYER DCA - WAIVED): 6.6 % (ref <7.0)

## 2017-10-30 MED ORDER — DOXYCYCLINE HYCLATE 100 MG PO TABS: 100.0000 mg | ORAL_TABLET | Freq: Two times a day (BID) | ORAL | 0 refills | Status: DC

## 2017-10-30 MED ORDER — GABAPENTIN 100 MG PO CAPS: 100.0000 mg | ORAL_CAPSULE | Freq: Three times a day (TID) | ORAL | 0 refills | Status: DC

## 2017-10-30 MED ORDER — RIVAROXABAN 15 MG PO TABS: 15.0000 mg | ORAL_TABLET | Freq: Every day | ORAL | 0 refills | Status: DC

## 2017-10-31 LAB — CMP14+EGFR
ALBUMIN: 4.7 g/dL (ref 3.5–4.8)
ALT: 15 IU/L (ref 0–32)
AST: 19 IU/L (ref 0–40)
Albumin/Globulin Ratio: 1.6 (ref 1.2–2.2)
Alkaline Phosphatase: 138 IU/L — ABNORMAL HIGH (ref 39–117)
BUN/Creatinine Ratio: 14 (ref 12–28)
BUN: 13 mg/dL (ref 8–27)
Bilirubin Total: 0.6 mg/dL (ref 0.0–1.2)
CO2: 24 mmol/L (ref 20–29)
CREATININE: 0.9 mg/dL (ref 0.57–1.00)
Calcium: 10.1 mg/dL (ref 8.7–10.3)
Chloride: 103 mmol/L (ref 96–106)
GFR calc Af Amer: 72 mL/min/{1.73_m2} (ref >59)
GFR, EST NON AFRICAN AMERICAN: 62 mL/min/{1.73_m2} (ref >59)
GLOBULIN, TOTAL: 3 g/dL (ref 1.5–4.5)
Glucose: 131 mg/dL — ABNORMAL HIGH (ref 65–99)
Potassium: 4.3 mmol/L (ref 3.5–5.2)
SODIUM: 143 mmol/L (ref 134–144)
Total Protein: 7.7 g/dL (ref 6.0–8.5)

## 2017-10-31 LAB — MICROALBUMIN / CREATININE URINE RATIO
CREATININE, UR: 32.8 mg/dL
Microalb/Creat Ratio: 41.5 mg/g creat — ABNORMAL HIGH (ref 0.0–30.0)
Microalbumin, Urine: 13.6 ug/mL

## 2017-11-03 NOTE — Progress Notes (Signed)
BP 139/82   Pulse 84   Ht 5' 4" (1.626 m)   Wt 159 lb (72.1 kg)   BMI 27.29 kg/m    Subjective:    Patient ID: Meredith Anderson, female    DOB: 11/23/1940, 77 y.o.   MRN: [id number]  HPI: Meredith Anderson is a 77 y.o. female presenting on 10/30/2017 for Follow-up (6 month ); Hypothyroidism; Hypertension; and Hyperlipidemia  This patient comes in for 72-monthrecheck on her conditions.  Is positive for heart disease, hypertension, pulmonary hypertension, hypothyroidism, hyperlipidemia.  Overall she is been doing very well.  She has had very good control of her abnormal rhythm problems.  She is feeling quite good and being very active. She is also had good control of her sugar.  She is ready for labs today.  Past Medical History:  Diagnosis Date  . A-fib ([place]   . Allergy    seasonal  . Anxiety   . Arthritis   . 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 Cataract   . Depression 12/12/2015  . Dyshydrosis   . Hemorrhoids   . Hyperlipidemia   . Hypertension   . Menopause   . NIDDM (non-insulin dependent diabetes mellitus)    diet controlled   . Osteopenia 2016  . Thyroid disease    Relevant past medical, surgical, family and social history reviewed and updated as indicated. Interim medical history since our last visit reviewed. Allergies and medications reviewed and updated. DATA REVIEWED: CHART IN EPIC  Family History reviewed for pertinent findings.  Review of Systems  Constitutional: Negative.  Negative for activity change, fatigue and fever.  HENT: Negative.   Eyes: Negative.   Respiratory: Negative.  Negative for cough.   Cardiovascular: Negative.  Negative for chest pain.  Gastrointestinal: Negative.  Negative for abdominal pain.  Endocrine: Negative.   Genitourinary: Negative.  Negative for dysuria.  Musculoskeletal: Negative.   Skin: Negative.   Neurological: Negative.     Allergies as of 10/30/2017      Reactions   Dofetilide Other (See Comments)   Passed  out   Amlodipine    Other reaction(s): Other (See Comments) Swollen ankles   Dronedarone Other (See Comments)   Increased BG   Glipizide-metformin Hcl    Other reaction(s): Diarrhea, Other (See Comments) Weakness   Metformin    Other reaction(s): Diarrhea   Niaspan [niacin] Rash      Medication List        Accurate as of 10/30/17 11:59 PM. Always use your most recent med list.          ACCU-CHEK AVIVA PLUS test strip Generic drug:  glucose blood USE TO CHECK BLOOD SUGAR ONCE A DAY OR AS INSTRUCTED   cholecalciferol 1000 units tablet Commonly known as:  VITAMIN D Take 1,000 Units by mouth daily.   diltiazem 120 MG 24 hr capsule Commonly known as:  CARDIZEM CD Take 1 capsule (120 mg total) by mouth daily.   diphenhydrAMINE 25 mg capsule Commonly known as:  BENADRYL 25 mg as needed.   doxycycline 100 MG tablet Commonly known as:  VIBRA-TABS Take 1 tablet (100 mg total) by mouth 2 (two) times daily. 1 po bid   escitalopram 10 MG tablet Commonly known as:  LEXAPRO TAKE 1 TABLET DAILY (REPLACES CELEXA)   fexofenadine 180 MG tablet Commonly known as:  ALLEGRA Take 1 tablet (180 mg total) by mouth daily. For allergy symptoms   furosemide  40 MG tablet Commonly known as:  LASIX Take 1 tablet (40 mg total) by mouth daily.   gabapentin 100 MG capsule Commonly known as:  NEURONTIN Take 1 capsule (100 mg total) by mouth 3 (three) times daily.   levothyroxine 125 MCG tablet Commonly known as:  SYNTHROID, LEVOTHROID TAKE 1 TABLET (125 MCG TOTAL) BY MOUTH DAILY BEFORE BREAKFAST.   lidocaine 2 % jelly Commonly known as:  XYLOCAINE Apply 1 application topically 3 (three) times daily. Apply to hemorrhoid when painful   magnesium oxide 400 MG tablet Commonly known as:  MAG-OX 400 mg.   potassium chloride SA 20 MEQ tablet Commonly known as:  KLOR-CON M20 Take 1 tablet (20 mEq total) by mouth daily.   PREMARIN vaginal cream Generic drug:  conjugated estrogens USING  APPLICATOR PLACE 1 GRAM IN VAGINA FOR 3 WEEKS,THEN USE EVERY OTHER NIGHT AS INSTRUCTED   PROCTOSOL HC 2.5 % rectal cream Generic drug:  hydrocortisone APPLY TO RECTAL AREA EVERY 6 HOURS AS NEEDED FOR HEMORRHOIDS   Rivaroxaban 15 MG Tabs tablet Commonly known as:  XARELTO Take 1 tablet (15 mg total) by mouth daily with supper.   rosuvastatin 20 MG tablet Commonly known as:  CRESTOR TAKE 1 TABLET BY MOUTH EVERY DAY   sitaGLIPtin 100 MG tablet Commonly known as:  JANUVIA Take 1 tablet (100 mg total) by mouth daily.   TETRAHYDROZOLINE HCL OP Apply to eye. Reported on 01/02/2016          Objective:    BP 139/82   Pulse 84   Ht 5' 4" (1.626 m)   Wt 159 lb (72.1 kg)   BMI 27.29 kg/m   Allergies  Allergen Reactions  . Dofetilide Other (See Comments)    Passed out  . Amlodipine     Other reaction(s): Other (See Comments) Swollen ankles  . Dronedarone Other (See Comments)    Increased BG  . Glipizide-Metformin Hcl     Other reaction(s): Diarrhea, Other (See Comments) Weakness  . Metformin     Other reaction(s): Diarrhea  . Niaspan [Niacin] Rash    Wt Readings from Last 3 Encounters:  10/30/17 159 lb (72.1 kg)  06/28/17 157 lb 12.8 oz (71.6 kg)  06/14/17 158 lb (71.7 kg)    Physical Exam  Constitutional: She is oriented to person, place, and time. She appears well-developed and well-nourished.  HENT:  Head: Normocephalic and atraumatic.  Eyes: Pupils are equal, round, and reactive to light. Conjunctivae and EOM are normal.  Cardiovascular: Normal rate, regular rhythm, normal heart sounds and intact distal pulses.  Pulmonary/Chest: Effort normal and breath sounds normal.  Abdominal: Soft. Bowel sounds are normal.  Neurological: She is alert and oriented to person, place, and time. She has normal reflexes.  Skin: Skin is warm and dry. No rash noted.  Psychiatric: She has a normal mood and affect. Her behavior is normal. Judgment and thought content normal.  Nursing  note and vitals reviewed.   Results for orders placed or performed in visit on 10/30/17  Microalbumin / creatinine urine ratio  Result Value Ref Range   Creatinine, Urine 32.8 Not Estab. mg/dL   Microalbumin, Urine 13.6 Not Estab. ug/mL   Microalb/Creat Ratio 41.5 (H) 0.0 - 30.0 mg/g creat  CMP14+EGFR  Result Value Ref Range   Glucose 131 (H) 65 - 99 mg/dL   BUN 13 8 - 27 mg/dL   Creatinine, Ser 0.90 0.57 - 1.00 mg/dL   GFR calc non Af Amer 62 >59 mL/min/1.73  GFR calc Af Amer 72 >59 mL/min/1.73   BUN/Creatinine Ratio 14 12 - 28   Sodium 143 134 - 144 mmol/L   Potassium 4.3 3.5 - 5.2 mmol/L   Chloride 103 96 - 106 mmol/L   CO2 24 20 - 29 mmol/L   Calcium 10.1 8.7 - 10.3 mg/dL   Total Protein 7.7 6.0 - 8.5 g/dL   Albumin 4.7 3.5 - 4.8 g/dL   Globulin, Total 3.0 1.5 - 4.5 g/dL   Albumin/Globulin Ratio 1.6 1.2 - 2.2   Bilirubin Total 0.6 0.0 - 1.2 mg/dL   Alkaline Phosphatase 138 (H) 39 - 117 IU/L   AST 19 0 - 40 IU/L   ALT 15 0 - 32 IU/L  Bayer DCA Hb A1c Waived  Result Value Ref Range   Bayer DCA Hb A1c Waived 6.6 <7.0 %      Assessment & Plan:   1. Type 2 diabetes mellitus without complication, without long-term current use of insulin (HCC) - Microalbumin / creatinine urine ratio - CMP14+EGFR - Bayer DCA Hb A1c Waived  2. Essential hypertension  3. SSS (sick sinus syndrome) ([place])  4. Hypothyroidism, unspecified type    Continue all other maintenance medications as listed above.  Follow up plan: Return in about 6 months (around 05/02/2018) for recheck.  Educational handout given for [place] PA-C [facility] [address]  [place], [place] [address] [phone]   11/03/2017, 9:49 PM

## 2017-11-13 DIAGNOSIS — I059 Rheumatic mitral valve disease, unspecified: Secondary | ICD-10-CM | POA: Diagnosis not present

## 2017-11-13 DIAGNOSIS — I471 Supraventricular tachycardia: Secondary | ICD-10-CM | POA: Diagnosis not present

## 2017-11-13 DIAGNOSIS — Z950 Presence of cardiac pacemaker: Secondary | ICD-10-CM | POA: Diagnosis not present

## 2017-11-13 DIAGNOSIS — M5380 Other specified dorsopathies, site unspecified: Secondary | ICD-10-CM | POA: Diagnosis not present

## 2017-11-13 DIAGNOSIS — Z7901 Long term (current) use of anticoagulants: Secondary | ICD-10-CM | POA: Diagnosis not present

## 2017-11-13 DIAGNOSIS — E039 Hypothyroidism, unspecified: Secondary | ICD-10-CM | POA: Diagnosis not present

## 2017-11-13 DIAGNOSIS — E119 Type 2 diabetes mellitus without complications: Secondary | ICD-10-CM | POA: Diagnosis not present

## 2017-11-13 DIAGNOSIS — Z9889 Other specified postprocedural states: Secondary | ICD-10-CM | POA: Diagnosis not present

## 2017-11-13 DIAGNOSIS — K219 Gastro-esophageal reflux disease without esophagitis: Secondary | ICD-10-CM | POA: Diagnosis not present

## 2017-11-13 DIAGNOSIS — Z79890 Hormone replacement therapy (postmenopausal): Secondary | ICD-10-CM | POA: Diagnosis not present

## 2017-11-13 DIAGNOSIS — Z794 Long term (current) use of insulin: Secondary | ICD-10-CM | POA: Diagnosis not present

## 2017-11-13 DIAGNOSIS — Z888 Allergy status to other drugs, medicaments and biological substances status: Secondary | ICD-10-CM | POA: Diagnosis not present

## 2017-11-13 DIAGNOSIS — I495 Sick sinus syndrome: Secondary | ICD-10-CM | POA: Diagnosis not present

## 2017-11-13 DIAGNOSIS — I480 Paroxysmal atrial fibrillation: Secondary | ICD-10-CM | POA: Diagnosis not present

## 2017-11-13 DIAGNOSIS — I10 Essential (primary) hypertension: Secondary | ICD-10-CM | POA: Diagnosis not present

## 2017-11-18 DIAGNOSIS — Z45018 Encounter for adjustment and management of other part of cardiac pacemaker: Secondary | ICD-10-CM | POA: Diagnosis not present

## 2017-11-18 DIAGNOSIS — I495 Sick sinus syndrome: Secondary | ICD-10-CM | POA: Diagnosis not present

## 2017-11-21 ENCOUNTER — Other Ambulatory Visit: Payer: Self-pay | Admitting: Physician Assistant

## 2017-11-26 ENCOUNTER — Other Ambulatory Visit: Payer: Self-pay | Admitting: Physician Assistant

## 2017-11-30 ENCOUNTER — Other Ambulatory Visit: Payer: Self-pay | Admitting: Family Medicine

## 2017-12-02 ENCOUNTER — Other Ambulatory Visit: Payer: Self-pay | Admitting: Physician Assistant

## 2018-01-04 ENCOUNTER — Other Ambulatory Visit: Payer: Self-pay | Admitting: Physician Assistant

## 2018-01-07 ENCOUNTER — Ambulatory Visit (INDEPENDENT_AMBULATORY_CARE_PROVIDER_SITE_OTHER): Payer: Medicare Other | Admitting: Physician Assistant

## 2018-01-07 ENCOUNTER — Encounter: Payer: Self-pay | Admitting: Physician Assistant

## 2018-01-07 VITALS — BP 130/82 | HR 89 | Temp 98.5°F | Ht 64.0 in | Wt 161.0 lb

## 2018-01-07 DIAGNOSIS — M25541 Pain in joints of right hand: Secondary | ICD-10-CM | POA: Diagnosis not present

## 2018-01-07 DIAGNOSIS — W57XXXA Bitten or stung by nonvenomous insect and other nonvenomous arthropods, initial encounter: Secondary | ICD-10-CM | POA: Insufficient documentation

## 2018-01-07 DIAGNOSIS — L539 Erythematous condition, unspecified: Secondary | ICD-10-CM | POA: Diagnosis not present

## 2018-01-07 DIAGNOSIS — M25542 Pain in joints of left hand: Secondary | ICD-10-CM | POA: Diagnosis not present

## 2018-01-07 MED ORDER — CEPHALEXIN 500 MG PO CAPS: 500.0000 mg | ORAL_CAPSULE | Freq: Four times a day (QID) | ORAL | 0 refills | Status: DC

## 2018-01-07 NOTE — Progress Notes (Signed)
BP 130/82   Pulse 89   Temp 98.5 F (36.9 C) (Oral)   Ht _0  (1.626 m)   Wt 161 lb (73 kg)   BMI 27.64 kg/m    Subjective:    Patient ID: Meredith Anderson, female    DOB: [birth year], 77 y.o.   MRN: [id number]  HPI: Meredith Anderson is a 77 y.o. female presenting on 01/07/2018 for Hands are red  This patient comes in for a couple of complaints.  She states that the palms of her hands are quite red and hot.  They are not painful.  She does not know of any exposure she has had anything.  She does have a history of tick bites.  She does not know of any rheumatoid condition in her.  She does have a relative with rheumatoid arthritis.  There are couple lesions one on her lower abdomen and one on the side of her hands that appear to be pus bumps.  She states they are not painful.  The one on her hand did drain a little bit last night.  Past Medical History:  Diagnosis Date  . A-fib ([place])   . Allergy    seasonal  . Anxiety   . Arthritis   . 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 Cataract   . Depression 12/12/2015  . Dyshydrosis   . Hemorrhoids   . Hyperlipidemia   . Hypertension   . Menopause   . NIDDM (non-insulin dependent diabetes mellitus)    diet controlled   . Osteopenia 2016  . Thyroid disease    Relevant past medical, surgical, family and social history reviewed and updated as indicated. Interim medical history since our last visit reviewed. Allergies and medications reviewed and updated. DATA REVIEWED: CHART IN EPIC  Family History reviewed for pertinent findings.  Review of Systems  Constitutional: Negative.   HENT: Negative.   Eyes: Negative.   Respiratory: Negative.   Gastrointestinal: Negative.   Genitourinary: Negative.   Skin: Positive for color change and wound. Negative for pallor.    Allergies as of 01/07/2018      Reactions   Dofetilide Other (See Comments)   Passed out   Doxycycline    diarrhea   Amlodipine    Other reaction(s): Other (See  Comments) Swollen ankles   Dronedarone Other (See Comments)   Increased BG   Glipizide-metformin Hcl    Other reaction(s): Diarrhea, Other (See Comments) Weakness   Metformin    Other reaction(s): Diarrhea   Niaspan [niacin] Rash      Medication List        Accurate as of 01/07/18  1:26 PM. Always use your most recent med list.          ACCU-CHEK AVIVA PLUS test strip Generic drug:  glucose blood USE TO CHECK BLOOD SUGAR ONCE A DAY OR AS INSTRUCTED   cephALEXin 500 MG capsule Commonly known as:  KEFLEX Take 1 capsule (500 mg total) by mouth 4 (four) times daily.   cholecalciferol 1000 units tablet Commonly known as:  VITAMIN D Take 1,000 Units by mouth daily.   diltiazem 120 MG 24 hr capsule Commonly known as:  CARDIZEM CD Take 1 capsule (120 mg total) by mouth daily.   diphenhydrAMINE 25 mg capsule Commonly known as:  BENADRYL 25 mg as needed.   escitalopram 10 MG tablet Commonly known as:  LEXAPRO TAKE 1 TABLET DAILY (REPLACES CELEXA)   fexofenadine  180 MG tablet Commonly known as:  ALLEGRA Take 1 tablet (180 mg total) by mouth daily. For allergy symptoms   furosemide 40 MG tablet Commonly known as:  LASIX Take 1 tablet (40 mg total) by mouth daily.   gabapentin 100 MG capsule Commonly known as:  NEURONTIN TAKE 1 CAPSULE BY MOUTH THREE TIMES A DAY   JANUVIA 100 MG tablet Generic drug:  sitaGLIPtin TAKE 1 TABLET BY MOUTH EVERY DAY   levothyroxine 125 MCG tablet Commonly known as:  SYNTHROID, LEVOTHROID TAKE 1 TABLET (125 MCG TOTAL) BY MOUTH DAILY BEFORE BREAKFAST.   lidocaine 2 % jelly Commonly known as:  XYLOCAINE Apply 1 application topically 3 (three) times daily. Apply to hemorrhoid when painful   magnesium oxide 400 MG tablet Commonly known as:  MAG-OX 400 mg.   potassium chloride SA 20 MEQ tablet Commonly known as:  KLOR-CON M20 Take 1 tablet (20 mEq total) by mouth daily.   PREMARIN vaginal cream Generic drug:  conjugated  estrogens USING APPLICATOR PLACE 1 GRAM IN VAGINA FOR 3 WEEKS,THEN USE EVERY OTHER NIGHT AS INSTRUCTED   PROCTOSOL HC 2.5 % rectal cream Generic drug:  hydrocortisone APPLY TO RECTAL AREA EVERY 6 HOURS AS NEEDED FOR HEMORRHOIDS   Rivaroxaban 15 MG Tabs tablet Commonly known as:  XARELTO Take 1 tablet (15 mg total) by mouth daily with supper.   XARELTO 15 MG Tabs tablet Generic drug:  Rivaroxaban TAKE 1 TABLET (15 MG TOTAL) BY MOUTH DAILY WITH SUPPER.   rosuvastatin 20 MG tablet Commonly known as:  CRESTOR TAKE 1 TABLET BY MOUTH EVERY DAY   TETRAHYDROZOLINE HCL OP Apply to eye. Reported on 01/02/2016          Objective:    BP 130/82   Pulse 89   Temp 98.5 F (36.9 C) (Oral)   Ht _0  (1.626 m)   Wt 161 lb (73 kg)   BMI 27.64 kg/m   Allergies  Allergen Reactions  . Dofetilide Other (See Comments)    Passed out  . Doxycycline     diarrhea   . Amlodipine     Other reaction(s): Other (See Comments) Swollen ankles  . Dronedarone Other (See Comments)    Increased BG  . Glipizide-Metformin Hcl     Other reaction(s): Diarrhea, Other (See Comments) Weakness  . Metformin     Other reaction(s): Diarrhea  . Niaspan [Niacin] Rash    Wt Readings from Last 3 Encounters:  01/07/18 161 lb (73 kg)  10/30/17 159 lb (72.1 kg)  06/28/17 157 lb 12.8 oz (71.6 kg)    Physical Exam  Constitutional: She is oriented to person, place, and time. She appears well-developed and well-nourished.  HENT:  Head: Normocephalic and atraumatic.  Eyes: Pupils are equal, round, and reactive to light. Conjunctivae and EOM are normal.  Cardiovascular: Normal rate, regular rhythm, normal heart sounds and intact distal pulses.  Pulmonary/Chest: Effort normal and breath sounds normal.  Abdominal: Soft. Bowel sounds are normal.  Neurological: She is alert and oriented to person, place, and time. She has normal reflexes.  Skin: Skin is warm and dry. Rash noted. Rash is pustular. There is  erythema.     Psychiatric: She has a normal mood and affect. Her behavior is normal. Judgment and thought content normal.    Results for orders placed or performed in visit on 10/30/17  Microalbumin / creatinine urine ratio  Result Value Ref Range   Creatinine, Urine 32.8 Not Estab. mg/dL   Microalbumin, Urine  13.6 Not Estab. ug/mL   Microalb/Creat Ratio 41.5 (H) 0.0 - 30.0 mg/g creat  CMP14+EGFR  Result Value Ref Range   Glucose 131 (H) 65 - 99 mg/dL   BUN 13 8 - 27 mg/dL   Creatinine, Ser 0.90 0.57 - 1.00 mg/dL   GFR calc non Af Amer 62 >59 mL/min/1.73   GFR calc Af Amer 72 >59 mL/min/1.73   BUN/Creatinine Ratio 14 12 - 28   Sodium 143 134 - 144 mmol/L   Potassium 4.3 3.5 - 5.2 mmol/L   Chloride 103 96 - 106 mmol/L   CO2 24 20 - 29 mmol/L   Calcium 10.1 8.7 - 10.3 mg/dL   Total Protein 7.7 6.0 - 8.5 g/dL   Albumin 4.7 3.5 - 4.8 g/dL   Globulin, Total 3.0 1.5 - 4.5 g/dL   Albumin/Globulin Ratio 1.6 1.2 - 2.2   Bilirubin Total 0.6 0.0 - 1.2 mg/dL   Alkaline Phosphatase 138 (H) 39 - 117 IU/L   AST 19 0 - 40 IU/L   ALT 15 0 - 32 IU/L  Bayer DCA Hb A1c Waived  Result Value Ref Range   HB A1C (BAYER DCA - WAIVED) 6.6 <7.0 %      Assessment & Plan:   1. Arthralgia of both hands - Arthritis Panel - Lyme Ab/Western Blot Reflex  2. Tick bite, initial encounter - Arthritis Panel - Lyme Ab/Western Blot Reflex  3. Redness of skin Cephalexin 500 mg 1 QID 10 days   Continue all other maintenance medications as listed above.  Follow up plan: Return in about 2 weeks (around 01/21/2018) for recheck.  Educational handout given for [place] PA-C [facility] [address]  [place], [place] [address] [phone]   01/07/2018, 1:26 PM

## 2018-01-08 LAB — HM DIABETES EYE EXAM

## 2018-01-09 LAB — ARTHRITIS PANEL
BASOS: 1 %
Basophils Absolute: 0 10*3/uL (ref 0.0–0.2)
EOS (ABSOLUTE): 0.2 10*3/uL (ref 0.0–0.4)
Eos: 3 %
HEMATOCRIT: 39.8 % (ref 34.0–46.6)
Hemoglobin: 12.8 g/dL (ref 11.1–15.9)
IMMATURE GRANULOCYTES: 0 %
Immature Grans (Abs): 0 10*3/uL (ref 0.0–0.1)
LYMPHS ABS: 1.3 10*3/uL (ref 0.7–3.1)
Lymphs: 28 %
MCH: 29 pg (ref 26.6–33.0)
MCHC: 32.2 g/dL (ref 31.5–35.7)
MCV: 90 fL (ref 79–97)
MONOS ABS: 0.5 10*3/uL (ref 0.1–0.9)
Monocytes: 11 %
NEUTROS PCT: 57 %
Neutrophils Absolute: 2.7 10*3/uL (ref 1.4–7.0)
Platelets: 210 10*3/uL (ref 150–450)
RBC: 4.42 x10E6/uL (ref 3.77–5.28)
RDW: 15.5 % — ABNORMAL HIGH (ref 12.3–15.4)
Rhuematoid fact SerPl-aCnc: <10 IU/mL (ref 0.0–13.9)
Sed Rate: 7 mm/hr (ref 0–40)
Uric Acid: 7.1 mg/dL (ref 2.5–7.1)
WBC: 4.6 10*3/uL (ref 3.4–10.8)

## 2018-01-09 LAB — LYME AB/WESTERN BLOT REFLEX

## 2018-01-21 ENCOUNTER — Encounter: Payer: Self-pay | Admitting: Physician Assistant

## 2018-01-21 ENCOUNTER — Ambulatory Visit (INDEPENDENT_AMBULATORY_CARE_PROVIDER_SITE_OTHER): Payer: Medicare Other | Admitting: Physician Assistant

## 2018-01-21 VITALS — BP 128/80 | HR 93 | Temp 98.1°F | Ht 64.0 in | Wt 162.6 lb

## 2018-01-21 DIAGNOSIS — M150 Primary generalized (osteo)arthritis: Secondary | ICD-10-CM

## 2018-01-21 DIAGNOSIS — M159 Polyosteoarthritis, unspecified: Secondary | ICD-10-CM

## 2018-01-21 MED ORDER — DICLOFENAC SODIUM 1 % TD GEL: 4.0000 g | Freq: Four times a day (QID) | TRANSDERMAL | 11 refills | Status: DC

## 2018-01-22 DIAGNOSIS — M159 Polyosteoarthritis, unspecified: Secondary | ICD-10-CM | POA: Insufficient documentation

## 2018-01-22 DIAGNOSIS — M150 Primary generalized (osteo)arthritis: Principal | ICD-10-CM

## 2018-01-22 NOTE — Progress Notes (Signed)
BP 128/80   Pulse 93   Temp 98.1 F (36.7 C) (Oral)   Ht 5\' 4"  (1.626 m)   Wt 162 lb 9.6 oz (73.8 kg)   BMI 27.91 kg/m    Subjective:    Patient ID: Meredith Anderson, female    DOB: August 01, 1940, 77 y.o.   MRN: [id number]  HPI: [name] is a 77 y.o. female presenting on 01/21/2018 for Follow up on red hands  This patient comes in for recheck on her hands.  She has been having difficulty with them hurting significantly and getting very red at times.  We performed a water blood work.  We have reviewed all of this.  And everything was negative.  All of the arthritis panel was normal.  She states that the medicine has calm things down.  She would like to continue with the topical diclofenac.   Past Medical History:  Diagnosis Date  . A-fib ([place])   . Allergy    seasonal  . Anxiety   . Arthritis   . 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 Cataract   . Depression 12/12/2015  . Dyshydrosis   . Hemorrhoids   . Hyperlipidemia   . Hypertension   . Menopause   . NIDDM (non-insulin dependent diabetes mellitus)    diet controlled   . Osteopenia 2016  . Thyroid disease    Relevant past medical, surgical, family and social history reviewed and updated as indicated. Interim medical history since our last visit reviewed. Allergies and medications reviewed and updated. DATA REVIEWED: CHART IN EPIC  Family History reviewed for pertinent findings.  Review of Systems  Constitutional: Negative.   HENT: Negative.   Eyes: Negative.   Respiratory: Negative.   Gastrointestinal: Negative.   Genitourinary: Negative.   Musculoskeletal: Positive for arthralgias, joint swelling and myalgias.  Skin: Positive for color change.    Allergies as of 01/21/2018      Reactions   Dofetilide Other (See Comments)   Passed out   Doxycycline    diarrhea   Amlodipine    Other reaction(s): Other (See Comments) Swollen ankles   Dronedarone Other (See Comments)   Increased BG   Glipizide-metformin Hcl    Other reaction(s): Diarrhea, Other (See Comments) Weakness   Metformin    Other reaction(s): Diarrhea   Niaspan [niacin] Rash      Medication List        Accurate as of 01/21/18 11:59 PM. Always use your most recent med list.          ACCU-CHEK AVIVA PLUS test strip Generic drug:  glucose blood USE TO CHECK BLOOD SUGAR ONCE A DAY OR AS INSTRUCTED   cephALEXin 500 MG capsule Commonly known as:  KEFLEX Take 1 capsule (500 mg total) by mouth 4 (four) times daily.   cholecalciferol 1000 units tablet Commonly known as:  VITAMIN D Take 1,000 Units by mouth daily.   diclofenac sodium 1 % Gel Commonly known as:  VOLTAREN Apply 4 g topically 4 (four) times daily.   diltiazem 120 MG 24 hr capsule Commonly known as:  CARDIZEM CD Take 1 capsule (120 mg total) by mouth daily.   diphenhydrAMINE 25 mg capsule Commonly known as:  BENADRYL 25 mg as needed.   escitalopram 10 MG tablet Commonly known as:  LEXAPRO TAKE 1 TABLET DAILY (REPLACES CELEXA)   fexofenadine 180 MG tablet Commonly known as:  ALLEGRA Take 1 tablet (180 mg total) by  mouth daily. For allergy symptoms   furosemide 40 MG tablet Commonly known as:  LASIX Take 1 tablet (40 mg total) by mouth daily.   gabapentin 100 MG capsule Commonly known as:  NEURONTIN TAKE 1 CAPSULE BY MOUTH THREE TIMES A DAY   JANUVIA 100 MG tablet Generic drug:  sitaGLIPtin TAKE 1 TABLET BY MOUTH EVERY DAY   levothyroxine 125 MCG tablet Commonly known as:  SYNTHROID, LEVOTHROID TAKE 1 TABLET (125 MCG TOTAL) BY MOUTH DAILY BEFORE BREAKFAST.   lidocaine 2 % jelly Commonly known as:  XYLOCAINE Apply 1 application topically 3 (three) times daily. Apply to hemorrhoid when painful   magnesium oxide 400 MG tablet Commonly known as:  MAG-OX 400 mg.   potassium chloride SA 20 MEQ tablet Commonly known as:  KLOR-CON M20 Take 1 tablet (20 mEq total) by mouth daily.   PREMARIN vaginal cream Generic drug:   conjugated estrogens USING APPLICATOR PLACE 1 GRAM IN VAGINA FOR 3 WEEKS,THEN USE EVERY OTHER NIGHT AS INSTRUCTED   PROCTOSOL HC 2.5 % rectal cream Generic drug:  hydrocortisone APPLY TO RECTAL AREA EVERY 6 HOURS AS NEEDED FOR HEMORRHOIDS   Rivaroxaban 15 MG Tabs tablet Commonly known as:  XARELTO Take 1 tablet (15 mg total) by mouth daily with supper.   XARELTO 15 MG Tabs tablet Generic drug:  Rivaroxaban TAKE 1 TABLET (15 MG TOTAL) BY MOUTH DAILY WITH SUPPER.   rosuvastatin 20 MG tablet Commonly known as:  CRESTOR TAKE 1 TABLET BY MOUTH EVERY DAY   TETRAHYDROZOLINE HCL OP Apply to eye. Reported on 01/02/2016          Objective:    BP 128/80   Pulse 93   Temp 98.1 F (36.7 C) (Oral)   Ht 5\' 4"  (1.626 m)   Wt 162 lb 9.6 oz (73.8 kg)   BMI 27.91 kg/m   Allergies  Allergen Reactions  . Dofetilide Other (See Comments)    Passed out  . Doxycycline     diarrhea   . Amlodipine     Other reaction(s): Other (See Comments) Swollen ankles  . Dronedarone Other (See Comments)    Increased BG  . Glipizide-Metformin Hcl     Other reaction(s): Diarrhea, Other (See Comments) Weakness  . Metformin     Other reaction(s): Diarrhea  . Niaspan [Niacin] Rash    Wt Readings from Last 3 Encounters:  01/21/18 162 lb 9.6 oz (73.8 kg)  01/07/18 161 lb (73 kg)  10/30/17 159 lb (72.1 kg)    Physical Exam  Constitutional: She is oriented to person, place, and time. She appears well-developed and well-nourished.  HENT:  Head: Normocephalic and atraumatic.  Eyes: Pupils are equal, round, and reactive to light. Conjunctivae and EOM are normal.  Cardiovascular: Normal rate, regular rhythm, normal heart sounds and intact distal pulses.  Pulmonary/Chest: Effort normal and breath sounds normal.  Abdominal: Soft. Bowel sounds are normal.  Musculoskeletal:       Left hand: She exhibits tenderness and deformity. She exhibits normal range of motion and no swelling. Normal sensation noted.  Decreased strength noted.       Hands: Neurological: She is alert and oriented to person, place, and time. She has normal reflexes.  Skin: Skin is warm and dry. No rash noted.  Psychiatric: She has a normal mood and affect. Her behavior is normal. Judgment and thought content normal.    Results for orders placed or performed in visit on 01/07/18  Arthritis Panel  Result Value Ref Range  Uric Acid 7.1 2.5 - 7.1 mg/dL   Rhuematoid fact SerPl-aCnc <10.0 0.0 - 13.9 IU/mL   WBC 4.6 3.4 - 10.8 x10E3/uL   RBC 4.42 3.77 - 5.28 x10E6/uL   Hemoglobin 12.8 11.1 - 15.9 g/dL   Hematocrit 39.8 34.0 - 46.6 %   MCV 90 79 - 97 fL   MCH 29.0 26.6 - 33.0 pg   MCHC 32.2 31.5 - 35.7 g/dL   RDW 15.5 (H) 12.3 - 15.4 %   Platelets 210 150 - 450 x10E3/uL   Neutrophils 57 Not Estab. %   Lymphs 28 Not Estab. %   Monocytes 11 Not Estab. %   Eos 3 Not Estab. %   Basos 1 Not Estab. %   Neutrophils Absolute 2.7 1.4 - 7.0 x10E3/uL   Lymphocytes Absolute 1.3 0.7 - 3.1 x10E3/uL   Monocytes Absolute 0.5 0.1 - 0.9 x10E3/uL   EOS (ABSOLUTE) 0.2 0.0 - 0.4 x10E3/uL   Basophils Absolute 0.0 0.0 - 0.2 x10E3/uL   Immature Granulocytes 0 Not Estab. %   Immature Grans (Abs) 0.0 0.0 - 0.1 x10E3/uL   Sed Rate 7 0 - 40 mm/hr  Lyme Ab/Western Blot Reflex  Result Value Ref Range   Lyme IgG/IgM Ab <0.91 0.00 - 0.90 ISR   LYME DISEASE AB, QUANT, IGM <0.80 0.00 - 0.79 index      Assessment & Plan:   1. Primary osteoarthritis involving multiple joints - diclofenac sodium (VOLTAREN) 1 % GEL; Apply 4 g topically 4 (four) times daily.  Dispense: 400 g; Refill: 11  Continue all other maintenance medications as listed above.  Follow up plan: No follow-ups on file.  Educational handout given for [place] PA-C [facility] [address]  [place], [place] [address] [phone]   01/22/2018, 8:32 AM

## 2018-02-13 DIAGNOSIS — I495 Sick sinus syndrome: Secondary | ICD-10-CM | POA: Diagnosis not present

## 2018-02-13 DIAGNOSIS — Z45018 Encounter for adjustment and management of other part of cardiac pacemaker: Secondary | ICD-10-CM | POA: Diagnosis not present

## 2018-02-14 DIAGNOSIS — I495 Sick sinus syndrome: Secondary | ICD-10-CM | POA: Diagnosis not present

## 2018-02-14 DIAGNOSIS — Z45018 Encounter for adjustment and management of other part of cardiac pacemaker: Secondary | ICD-10-CM | POA: Diagnosis not present

## 2018-02-23 ENCOUNTER — Other Ambulatory Visit: Payer: Self-pay | Admitting: Family Medicine

## 2018-03-06 ENCOUNTER — Other Ambulatory Visit: Payer: Self-pay | Admitting: Physician Assistant

## 2018-03-07 ENCOUNTER — Other Ambulatory Visit: Payer: Self-pay | Admitting: Physician Assistant

## 2018-03-07 NOTE — Telephone Encounter (Signed)
Last lipid 03/22/17

## 2018-03-12 DIAGNOSIS — Z09 Encounter for follow-up examination after completed treatment for conditions other than malignant neoplasm: Secondary | ICD-10-CM | POA: Diagnosis not present

## 2018-03-12 DIAGNOSIS — Z7901 Long term (current) use of anticoagulants: Secondary | ICD-10-CM | POA: Diagnosis not present

## 2018-03-12 DIAGNOSIS — I4891 Unspecified atrial fibrillation: Secondary | ICD-10-CM | POA: Diagnosis not present

## 2018-03-12 DIAGNOSIS — Z950 Presence of cardiac pacemaker: Secondary | ICD-10-CM | POA: Diagnosis not present

## 2018-03-12 DIAGNOSIS — Z79899 Other long term (current) drug therapy: Secondary | ICD-10-CM | POA: Diagnosis not present

## 2018-03-12 DIAGNOSIS — I480 Paroxysmal atrial fibrillation: Secondary | ICD-10-CM | POA: Diagnosis not present

## 2018-03-31 ENCOUNTER — Other Ambulatory Visit: Payer: Self-pay | Admitting: Physician Assistant

## 2018-03-31 MED ORDER — SITAGLIPTIN PHOSPHATE 100 MG PO TABS: 100.0000 mg | ORAL_TABLET | Freq: Every day | ORAL | 0 refills | Status: DC

## 2018-03-31 NOTE — Telephone Encounter (Signed)
Refill failed, resent 

## 2018-03-31 NOTE — Addendum Note (Signed)
Addended by: Antonietta Barcelona D on: 03/31/2018 10:56 AM   Modules accepted: Orders

## 2018-04-14 ENCOUNTER — Ambulatory Visit: Payer: Medicare Other | Admitting: Physician Assistant

## 2018-04-16 ENCOUNTER — Encounter: Payer: Self-pay | Admitting: Physician Assistant

## 2018-04-16 ENCOUNTER — Ambulatory Visit (INDEPENDENT_AMBULATORY_CARE_PROVIDER_SITE_OTHER): Payer: Medicare Other | Admitting: Physician Assistant

## 2018-04-16 VITALS — BP 139/83 | HR 92 | Temp 97.4°F | Ht 64.0 in | Wt 165.8 lb

## 2018-04-16 DIAGNOSIS — L820 Inflamed seborrheic keratosis: Secondary | ICD-10-CM

## 2018-04-16 DIAGNOSIS — Z23 Encounter for immunization: Secondary | ICD-10-CM

## 2018-04-18 NOTE — Progress Notes (Signed)
BP 139/83   Pulse 92   Temp (!) 97.4 F (36.3 C) (Oral)   Ht 5\' 4"  (1.626 m)   Wt 165 lb 12.8 oz (75.2 kg)   BMI 28.46 kg/m     Subjective:    Patient ID: Meredith Anderson, female    DOB: [birth year], 77 y.o.   MRN: [id number]  HPI: Meredith Anderson is a 77 y.o. female presenting on 04/16/2018 for sore on leg (right )  Patient reports that she has had a spot on her leg for several weeks.  She denies any fever or chills.  She denies any injury.  She states that it has bled.  He does feel like he can peel off.  She has had a skin cancer on the back of her leg in the past.  We have discussed that she needs to go to dermatology again for the possibility of skin cancer.  Past Medical History:  Diagnosis Date  . A-fib ([place])   . Allergy    seasonal  . Anxiety   . Arthritis   . 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 Cataract   . Depression 12/12/2015  . Dyshydrosis   . Hemorrhoids   . Hyperlipidemia   . Hypertension   . Menopause   . NIDDM (non-insulin dependent diabetes mellitus)    diet controlled   . Osteopenia 2016  . Thyroid disease    Relevant past medical, surgical, family and social history reviewed and updated as indicated. Interim medical history since our last visit reviewed. Allergies and medications reviewed and updated. DATA REVIEWED: CHART IN EPIC  Family History reviewed for pertinent findings.  Review of Systems  Constitutional: Negative.   HENT: Negative.   Eyes: Negative.   Respiratory: Negative.   Gastrointestinal: Negative.   Genitourinary: Negative.   Skin: Positive for color change and wound.    Allergies as of 04/16/2018      Reactions   Dofetilide Other (See Comments)   Passed out   Doxycycline    diarrhea   Amlodipine    Other reaction(s): Other (See Comments) Swollen ankles   Dronedarone Other (See Comments)   Increased BG   Glipizide-metformin Hcl    Other reaction(s): Diarrhea, Other (See Comments) Weakness   Metformin    Other reaction(s): Diarrhea   Niaspan [niacin] Rash      Medication List        Accurate as of 04/16/18 11:59 PM. Always use your most recent med list.          ACCU-CHEK AVIVA PLUS test strip Generic drug:  glucose blood USE TO CHECK BLOOD SUGAR ONCE A DAY OR AS INSTRUCTED   cholecalciferol 1000 units tablet Commonly known as:  VITAMIN D Take 1,000 Units by mouth daily.   diclofenac sodium 1 % Gel Commonly known as:  VOLTAREN Apply 4 g topically 4 (four) times daily.   diltiazem 120 MG 24 hr capsule Commonly known as:  CARDIZEM CD Take 1 capsule (120 mg total) by mouth daily.   diphenhydrAMINE 25 mg capsule Commonly known as:  BENADRYL 25 mg as needed.   escitalopram 10 MG tablet Commonly known as:  LEXAPRO TAKE 1 TABLET DAILY (REPLACES CELEXA)   fexofenadine 180 MG tablet Commonly known as:  ALLEGRA Take 1 tablet (180 mg total) by mouth daily. For allergy symptoms   furosemide 40 MG tablet Commonly known as:  LASIX TAKE 1 TABLET BY MOUTH EVERY DAY   gabapentin  100 MG capsule Commonly known as:  NEURONTIN TAKE 1 CAPSULE BY MOUTH THREE TIMES A DAY   KLOR-CON M20 20 MEQ tablet Generic drug:  potassium chloride SA TAKE 1 TABLET BY MOUTH EVERY DAY   levothyroxine 125 MCG tablet Commonly known as:  SYNTHROID, LEVOTHROID TAKE 1 TABLET (125 MCG TOTAL) BY MOUTH DAILY BEFORE BREAKFAST.   lidocaine 2 % jelly Commonly known as:  XYLOCAINE Apply 1 application topically 3 (three) times daily. Apply to hemorrhoid when painful   magnesium oxide 400 MG tablet Commonly known as:  MAG-OX 400 mg.   PREMARIN vaginal cream Generic drug:  conjugated estrogens USING APPLICATOR PLACE 1 GRAM IN VAGINA FOR 3 WEEKS,THEN USE EVERY OTHER NIGHT AS INSTRUCTED   PROCTOSOL HC 2.5 % rectal cream Generic drug:  hydrocortisone APPLY TO RECTAL AREA EVERY 6 HOURS AS NEEDED FOR HEMORRHOIDS   Rivaroxaban 15 MG Tabs tablet Commonly known as:  XARELTO Take 1 tablet (15 mg total) by  mouth daily with supper.   XARELTO 15 MG Tabs tablet Generic drug:  Rivaroxaban TAKE 1 TABLET (15 MG TOTAL) BY MOUTH DAILY WITH SUPPER.   rosuvastatin 20 MG tablet Commonly known as:  CRESTOR TAKE 1 TABLET BY MOUTH EVERY DAY   sitaGLIPtin 100 MG tablet Commonly known as:  JANUVIA Take 1 tablet (100 mg total) by mouth daily.   TETRAHYDROZOLINE HCL OP Apply to eye. Reported on 01/02/2016          Objective:    BP 139/83   Pulse 92   Temp (!) 97.4 F (36.3 C) (Oral)   Ht 5\' 4"  (1.626 m)   Wt 165 lb 12.8 oz (75.2 kg)   BMI 28.46 kg/m    Allergies  Allergen Reactions  . Dofetilide Other (See Comments)    Passed out  . Doxycycline     diarrhea   . Amlodipine     Other reaction(s): Other (See Comments) Swollen ankles  . Dronedarone Other (See Comments)    Increased BG  . Glipizide-Metformin Hcl     Other reaction(s): Diarrhea, Other (See Comments) Weakness  . Metformin     Other reaction(s): Diarrhea  . Niaspan [Niacin] Rash    Wt Readings from Last 3 Encounters:  04/16/18 165 lb 12.8 oz (75.2 kg)  01/21/18 162 lb 9.6 oz (73.8 kg)  01/07/18 161 lb (73 kg)    Physical Exam  Constitutional: She is oriented to person, place, and time. She appears well-developed and well-nourished.  HENT:  Head: Normocephalic and atraumatic.  Eyes: Pupils are equal, round, and reactive to light. Conjunctivae and EOM are normal.  Cardiovascular: Normal rate, regular rhythm, normal heart sounds and intact distal pulses.  Pulmonary/Chest: Effort normal and breath sounds normal.  Abdominal: Soft. Bowel sounds are normal.  Neurological: She is alert and oriented to person, place, and time. She has normal reflexes.  Skin: Skin is warm and dry. Lesion noted. No rash noted. There is erythema.     Inflamed seborrheic keratosis  Psychiatric: She has a normal mood and affect. Her behavior is normal. Judgment and thought content normal.    Results for orders placed or performed in visit  on 02/04/18  HM DIABETES EYE EXAM  Result Value Ref Range   HM Diabetic Eye Exam No Retinopathy No Retinopathy      Assessment & Plan:   1. Inflamed seborrheic keratosis Dermatology appointment, she is calling herself  2. Encounter for immunization - Flu vaccine HIGH DOSE PF   Continue all other  maintenance medications as listed above.  Follow up plan: No follow-ups on file.  Educational handout given for [place] PA-C [place] [address]  [place], [place] [address] [phone]   04/18/2018, 1:30 PM

## 2018-04-22 DIAGNOSIS — C44311 Basal cell carcinoma of skin of nose: Secondary | ICD-10-CM | POA: Diagnosis not present

## 2018-04-22 DIAGNOSIS — D0471 Carcinoma in situ of skin of right lower limb, including hip: Secondary | ICD-10-CM | POA: Diagnosis not present

## 2018-04-22 DIAGNOSIS — L570 Actinic keratosis: Secondary | ICD-10-CM | POA: Diagnosis not present

## 2018-04-29 ENCOUNTER — Other Ambulatory Visit: Payer: Self-pay | Admitting: Physician Assistant

## 2018-05-02 ENCOUNTER — Ambulatory Visit (INDEPENDENT_AMBULATORY_CARE_PROVIDER_SITE_OTHER): Payer: Medicare Other | Admitting: Physician Assistant

## 2018-05-02 ENCOUNTER — Encounter: Payer: Self-pay | Admitting: Physician Assistant

## 2018-05-02 VITALS — BP 149/87 | HR 92 | Temp 97.3°F | Ht 64.0 in | Wt 165.0 lb

## 2018-05-02 DIAGNOSIS — E119 Type 2 diabetes mellitus without complications: Secondary | ICD-10-CM

## 2018-05-02 DIAGNOSIS — I10 Essential (primary) hypertension: Secondary | ICD-10-CM | POA: Diagnosis not present

## 2018-05-02 DIAGNOSIS — E039 Hypothyroidism, unspecified: Secondary | ICD-10-CM

## 2018-05-03 LAB — CBC WITH DIFFERENTIAL/PLATELET
Basophils Absolute: 0 10*3/uL (ref 0.0–0.2)
Basos: 1 %
EOS (ABSOLUTE): 0.1 10*3/uL (ref 0.0–0.4)
EOS: 3 %
HEMATOCRIT: 38.6 % (ref 34.0–46.6)
HEMOGLOBIN: 13 g/dL (ref 11.1–15.9)
IMMATURE GRANULOCYTES: 0 %
Immature Grans (Abs): 0 10*3/uL (ref 0.0–0.1)
Lymphocytes Absolute: 1.4 10*3/uL (ref 0.7–3.1)
Lymphs: 27 %
MCH: 29.9 pg (ref 26.6–33.0)
MCHC: 33.7 g/dL (ref 31.5–35.7)
MCV: 89 fL (ref 79–97)
MONOCYTES: 11 %
MONOS ABS: 0.6 10*3/uL (ref 0.1–0.9)
NEUTROS PCT: 58 %
Neutrophils Absolute: 3.1 10*3/uL (ref 1.4–7.0)
PLATELETS: 224 10*3/uL (ref 150–450)
RBC: 4.35 x10E6/uL (ref 3.77–5.28)
RDW: 14 % (ref 12.3–15.4)
WBC: 5.3 10*3/uL (ref 3.4–10.8)

## 2018-05-03 LAB — LIPID PANEL
CHOL/HDL RATIO: 2.9 ratio (ref 0.0–4.4)
Cholesterol, Total: 106 mg/dL (ref 100–199)
HDL: 37 mg/dL — ABNORMAL LOW (ref >39)
LDL CALC: 48 mg/dL (ref 0–99)
Triglycerides: 104 mg/dL (ref 0–149)
VLDL Cholesterol Cal: 21 mg/dL (ref 5–40)

## 2018-05-03 LAB — CMP14+EGFR
ALT: 11 IU/L (ref 0–32)
AST: 20 IU/L (ref 0–40)
Albumin/Globulin Ratio: 1.6 (ref 1.2–2.2)
Albumin: 4.9 g/dL — ABNORMAL HIGH (ref 3.5–4.8)
Alkaline Phosphatase: 107 IU/L (ref 39–117)
BUN/Creatinine Ratio: 15 (ref 12–28)
BUN: 15 mg/dL (ref 8–27)
Bilirubin Total: 0.6 mg/dL (ref 0.0–1.2)
CALCIUM: 10 mg/dL (ref 8.7–10.3)
CO2: 23 mmol/L (ref 20–29)
Chloride: 104 mmol/L (ref 96–106)
Creatinine, Ser: 0.99 mg/dL (ref 0.57–1.00)
GFR calc Af Amer: 64 mL/min/{1.73_m2} (ref >59)
GFR, EST NON AFRICAN AMERICAN: 56 mL/min/{1.73_m2} — AB (ref >59)
GLUCOSE: 136 mg/dL — AB (ref 65–99)
Globulin, Total: 3.1 g/dL (ref 1.5–4.5)
Potassium: 4.5 mmol/L (ref 3.5–5.2)
Sodium: 143 mmol/L (ref 134–144)
Total Protein: 8 g/dL (ref 6.0–8.5)

## 2018-05-03 LAB — MICROALBUMIN / CREATININE URINE RATIO
CREATININE, UR: 9.6 mg/dL
MICROALBUM., U, RANDOM: 10.1 ug/mL

## 2018-05-03 LAB — TSH: TSH: 1.05 u[IU]/mL (ref 0.450–4.500)

## 2018-05-05 DIAGNOSIS — I4891 Unspecified atrial fibrillation: Secondary | ICD-10-CM | POA: Diagnosis not present

## 2018-05-05 DIAGNOSIS — Z950 Presence of cardiac pacemaker: Secondary | ICD-10-CM | POA: Diagnosis not present

## 2018-05-05 DIAGNOSIS — Z45018 Encounter for adjustment and management of other part of cardiac pacemaker: Secondary | ICD-10-CM | POA: Diagnosis not present

## 2018-05-05 NOTE — Progress Notes (Signed)
BP (!) 149/87   Pulse 92   Temp (!) 97.3 F (36.3 C) (Oral)   Ht 5' 4"  (1.626 m)   Wt 165 lb (74.8 kg)   BMI 28.32 kg/m    Subjective:    Patient ID: Meredith Anderson, female    DOB: 30-Jun-1941, 77 y.o.   MRN: [id number]  HPI: Meredith Anderson is a 77 y.o. female presenting on 05/02/2018 for Medical Management of Chronic Issues  This patient comes in for periodic recheck on medications and conditions including type 2 diabetes, hypothyroidism, hypertension.  We reviewed all of her medications.  There are some refills that are needed.  We will also have labs performed.  She is up-to-date on vaccines.  She is still being followed by cardiology at [facility].   All medications are reviewed today. There are no reports of any problems with the medications. All of the medical conditions are reviewed and updated.  Lab work is reviewed and will be ordered as medically necessary. There are no new problems reported with today's visit.   Past Medical History:  Diagnosis Date  . A-fib ([place])   . Allergy    seasonal  . Anxiety   . Arthritis   . 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 Cataract   . Depression 12/12/2015  . Dyshydrosis   . Hemorrhoids   . Hyperlipidemia   . Hypertension   . Menopause   . NIDDM (non-insulin dependent diabetes mellitus)    diet controlled   . Osteopenia 2016  . Thyroid disease    Relevant past medical, surgical, family and social history reviewed and updated as indicated. Interim medical history since our last visit reviewed. Allergies and medications reviewed and updated. DATA REVIEWED: CHART IN EPIC  Family History reviewed for pertinent findings.  Review of Systems  Constitutional: Negative.   HENT: Negative.   Eyes: Negative.   Respiratory: Negative.   Gastrointestinal: Negative.   Genitourinary: Negative.     Allergies as of 05/02/2018      Reactions   Dofetilide Other (See Comments)   Passed out   Doxycycline    diarrhea   Amlodipine    Other reaction(s): Other (See Comments) Swollen ankles   Dronedarone Other (See Comments)   Increased BG   Glipizide-metformin Hcl    Other reaction(s): Diarrhea, Other (See Comments) Weakness   Metformin    Other reaction(s): Diarrhea   Niaspan [niacin] Rash      Medication List        Accurate as of 05/02/18 11:59 PM. Always use your most recent med list.          ACCU-CHEK AVIVA PLUS test strip Generic drug:  glucose blood USE TO CHECK BLOOD SUGAR ONCE A DAY OR AS INSTRUCTED   cholecalciferol 1000 units tablet Commonly known as:  VITAMIN D Take 1,000 Units by mouth daily.   diclofenac sodium 1 % Gel Commonly known as:  VOLTAREN Apply 4 g topically 4 (four) times daily.   diltiazem 120 MG 24 hr capsule Commonly known as:  CARDIZEM CD TAKE 1 CAPSULE BY MOUTH EVERY DAY   diphenhydrAMINE 25 mg capsule Commonly known as:  BENADRYL 25 mg as needed.   escitalopram 10 MG tablet Commonly known as:  LEXAPRO TAKE 1 TABLET DAILY (REPLACES CELEXA)   fexofenadine 180 MG tablet Commonly known as:  ALLEGRA Take 1 tablet (180 mg total) by mouth daily. For allergy symptoms  furosemide 40 MG tablet Commonly known as:  LASIX TAKE 1 TABLET BY MOUTH EVERY DAY   gabapentin 100 MG capsule Commonly known as:  NEURONTIN TAKE 1 CAPSULE BY MOUTH THREE TIMES A DAY   KLOR-CON M20 20 MEQ tablet Generic drug:  potassium chloride SA TAKE 1 TABLET BY MOUTH EVERY DAY   levothyroxine 125 MCG tablet Commonly known as:  SYNTHROID, LEVOTHROID TAKE 1 TABLET (125 MCG TOTAL) BY MOUTH DAILY BEFORE BREAKFAST.   lidocaine 2 % jelly Commonly known as:  XYLOCAINE Apply 1 application topically 3 (three) times daily. Apply to hemorrhoid when painful   magnesium oxide 400 MG tablet Commonly known as:  MAG-OX 400 mg.   PREMARIN vaginal cream Generic drug:  conjugated estrogens USING APPLICATOR PLACE 1 GRAM IN VAGINA FOR 3 WEEKS,THEN USE EVERY OTHER  NIGHT AS INSTRUCTED   PROCTOSOL HC 2.5 % rectal cream Generic drug:  hydrocortisone APPLY TO RECTAL AREA EVERY 6 HOURS AS NEEDED FOR HEMORRHOIDS   Rivaroxaban 15 MG Tabs tablet Commonly known as:  XARELTO Take 1 tablet (15 mg total) by mouth daily with supper.   XARELTO 15 MG Tabs tablet Generic drug:  Rivaroxaban TAKE 1 TABLET (15 MG TOTAL) BY MOUTH DAILY WITH SUPPER.   rosuvastatin 20 MG tablet Commonly known as:  CRESTOR TAKE 1 TABLET BY MOUTH EVERY DAY   sitaGLIPtin 100 MG tablet Commonly known as:  JANUVIA Take 1 tablet (100 mg total) by mouth daily.   TETRAHYDROZOLINE HCL OP Apply to eye. Reported on 01/02/2016          Objective:    BP (!) 149/87   Pulse 92   Temp (!) 97.3 F (36.3 C) (Oral)   Ht 5' 4"  (1.626 m)   Wt 165 lb (74.8 kg)   BMI 28.32 kg/m   Allergies  Allergen Reactions  . Dofetilide Other (See Comments)    Passed out  . Doxycycline     diarrhea   . Amlodipine     Other reaction(s): Other (See Comments) Swollen ankles  . Dronedarone Other (See Comments)    Increased BG  . Glipizide-Metformin Hcl     Other reaction(s): Diarrhea, Other (See Comments) Weakness  . Metformin     Other reaction(s): Diarrhea  . Niaspan [Niacin] Rash    Wt Readings from Last 3 Encounters:  05/02/18 165 lb (74.8 kg)  04/16/18 165 lb 12.8 oz (75.2 kg)  01/21/18 162 lb 9.6 oz (73.8 kg)    Physical Exam  Constitutional: She is oriented to person, place, and time. She appears well-developed and well-nourished.  HENT:  Head: Normocephalic and atraumatic.  Eyes: Pupils are equal, round, and reactive to light. Conjunctivae and EOM are normal.  Cardiovascular: Normal rate, regular rhythm, normal heart sounds and intact distal pulses.  Pulmonary/Chest: Effort normal and breath sounds normal.  Abdominal: Soft. Bowel sounds are normal.  Neurological: She is alert and oriented to person, place, and time. She has normal reflexes.  Skin: Skin is warm and dry. No rash  noted.  Psychiatric: She has a normal mood and affect. Her behavior is normal. Judgment and thought content normal.    Results for orders placed or performed in visit on 05/02/18  CBC with Differential/Platelet  Result Value Ref Range   WBC 5.3 3.4 - 10.8 x10E3/uL   RBC 4.35 3.77 - 5.28 x10E6/uL   Hemoglobin 13.0 11.1 - 15.9 g/dL   Hematocrit 38.6 34.0 - 46.6 %   MCV 89 79 - 97 fL  MCH 29.9 26.6 - 33.0 pg   MCHC 33.7 31.5 - 35.7 g/dL   RDW 14.0 12.3 - 15.4 %   Platelets 224 150 - 450 x10E3/uL   Neutrophils 58 Not Estab. %   Lymphs 27 Not Estab. %   Monocytes 11 Not Estab. %   Eos 3 Not Estab. %   Basos 1 Not Estab. %   Neutrophils Absolute 3.1 1.4 - 7.0 x10E3/uL   Lymphocytes Absolute 1.4 0.7 - 3.1 x10E3/uL   Monocytes Absolute 0.6 0.1 - 0.9 x10E3/uL   EOS (ABSOLUTE) 0.1 0.0 - 0.4 x10E3/uL   Basophils Absolute 0.0 0.0 - 0.2 x10E3/uL   Immature Granulocytes 0 Not Estab. %   Immature Grans (Abs) 0.0 0.0 - 0.1 x10E3/uL  CMP14+EGFR  Result Value Ref Range   Glucose 136 (H) 65 - 99 mg/dL   BUN 15 8 - 27 mg/dL   Creatinine, Ser 0.99 0.57 - 1.00 mg/dL   GFR calc non Af Amer 56 (L) >59 mL/min/1.73   GFR calc Af Amer 64 >59 mL/min/1.73   BUN/Creatinine Ratio 15 12 - 28   Sodium 143 134 - 144 mmol/L   Potassium 4.5 3.5 - 5.2 mmol/L   Chloride 104 96 - 106 mmol/L   CO2 23 20 - 29 mmol/L   Calcium 10.0 8.7 - 10.3 mg/dL   Total Protein 8.0 6.0 - 8.5 g/dL   Albumin 4.9 (H) 3.5 - 4.8 g/dL   Globulin, Total 3.1 1.5 - 4.5 g/dL   Albumin/Globulin Ratio 1.6 1.2 - 2.2   Bilirubin Total 0.6 0.0 - 1.2 mg/dL   Alkaline Phosphatase 107 39 - 117 IU/L   AST 20 0 - 40 IU/L   ALT 11 0 - 32 IU/L  Lipid panel  Result Value Ref Range   Cholesterol, Total 106 100 - 199 mg/dL   Triglycerides 104 0 - 149 mg/dL   HDL 37 (L) >39 mg/dL   VLDL Cholesterol Cal 21 5 - 40 mg/dL   LDL Calculated 48 0 - 99 mg/dL   Chol/HDL Ratio 2.9 0.0 - 4.4 ratio  TSH  Result Value Ref Range   TSH 1.050 0.450 -  4.500 uIU/mL  Microalbumin / creatinine urine ratio  Result Value Ref Range   Creatinine, Urine 9.6 Not Estab. mg/dL   Microalbumin, Urine 10.1 Not Estab. ug/mL   Microalb/Creat Ratio Comment (A) 0.0 - 30.0 mg/g creat      Assessment & Plan:   1. Type 2 diabetes mellitus without complication, without long-term current use of insulin (HCC) - CBC with Differential/Platelet - CMP14+EGFR - Lipid panel - Microalbumin / creatinine urine ratio  2. Hypothyroidism, unspecified type - TSH  3. Essential hypertension - CBC with Differential/Platelet - CMP14+EGFR - Lipid panel - Microalbumin / creatinine urine ratio   Continue all other maintenance medications as listed above.  Follow up plan: Return in about 6 months (around 10/31/2018) for recheck and labs.  Educational handout given for [place] PA-C [place] [address]  [place], [place] [address] [phone]   05/05/2018, 1:01 PM

## 2018-05-08 ENCOUNTER — Other Ambulatory Visit: Payer: Self-pay | Admitting: Family Medicine

## 2018-05-31 ENCOUNTER — Other Ambulatory Visit: Payer: Self-pay | Admitting: Physician Assistant

## 2018-06-03 ENCOUNTER — Other Ambulatory Visit: Payer: Self-pay | Admitting: Physician Assistant

## 2018-06-05 ENCOUNTER — Other Ambulatory Visit: Payer: Self-pay | Admitting: Physician Assistant

## 2018-06-06 NOTE — Telephone Encounter (Signed)
OV 05/02/18 rtc 6 mos

## 2018-06-11 DIAGNOSIS — C44311 Basal cell carcinoma of skin of nose: Secondary | ICD-10-CM | POA: Diagnosis not present

## 2018-08-04 DIAGNOSIS — Z950 Presence of cardiac pacemaker: Secondary | ICD-10-CM | POA: Diagnosis not present

## 2018-08-04 DIAGNOSIS — Z45018 Encounter for adjustment and management of other part of cardiac pacemaker: Secondary | ICD-10-CM | POA: Diagnosis not present

## 2018-08-04 DIAGNOSIS — I4891 Unspecified atrial fibrillation: Secondary | ICD-10-CM | POA: Diagnosis not present

## 2018-08-24 ENCOUNTER — Other Ambulatory Visit: Payer: Self-pay | Admitting: Physician Assistant

## 2018-08-25 ENCOUNTER — Ambulatory Visit (INDEPENDENT_AMBULATORY_CARE_PROVIDER_SITE_OTHER): Payer: Medicare Other | Admitting: Family

## 2018-08-25 ENCOUNTER — Encounter: Payer: Self-pay | Admitting: Family

## 2018-08-25 VITALS — BP 102/62 | HR 88 | Temp 97.9°F | Ht 64.0 in | Wt 165.4 lb

## 2018-08-25 DIAGNOSIS — J0110 Acute frontal sinusitis, unspecified: Secondary | ICD-10-CM

## 2018-08-25 DIAGNOSIS — R6889 Other general symptoms and signs: Secondary | ICD-10-CM | POA: Diagnosis not present

## 2018-08-25 LAB — VERITOR FLU A/B WAIVED
Influenza A: NEGATIVE
Influenza B: NEGATIVE

## 2018-08-25 MED ORDER — AMOXICILLIN-POT CLAVULANATE 875-125 MG PO TABS: 1.0000 | ORAL_TABLET | Freq: Two times a day (BID) | ORAL | 0 refills | Status: DC

## 2018-08-25 MED ORDER — HYDROCODONE-HOMATROPINE 5-1.5 MG/5ML PO SYRP: 5.0000 mL | ORAL_SOLUTION | Freq: Three times a day (TID) | ORAL | 0 refills | Status: DC | PRN

## 2018-08-25 NOTE — Patient Instructions (Signed)
Sinusitis, Adult  Sinusitis is inflammation of your sinuses. Sinuses are hollow spaces in the bones around your face. Your sinuses are located:   Around your eyes.   In the middle of your forehead.   Behind your nose.   In your cheekbones.  Mucus normally drains out of your sinuses. When your nasal tissues become inflamed or swollen, mucus can become trapped or blocked. This allows bacteria, viruses, and fungi to grow, which leads to infection. Most infections of the sinuses are caused by a virus.  Sinusitis can develop quickly. It can last for up to 4 weeks (acute) or for more than 12 weeks (chronic). Sinusitis often develops after a cold.  What are the causes?  This condition is caused by anything that creates swelling in the sinuses or stops mucus from draining. This includes:   Allergies.   Asthma.   Infection from bacteria or viruses.   Deformities or blockages in your nose or sinuses.   Abnormal growths in the nose (nasal polyps).   Pollutants, such as chemicals or irritants in the air.   Infection from fungi (rare).  What increases the risk?  You are more likely to develop this condition if you:   Have a weak body defense system (immune system).   Do a lot of swimming or diving.   Overuse nasal sprays.   Smoke.  What are the signs or symptoms?  The main symptoms of this condition are pain and a feeling of pressure around the affected sinuses. Other symptoms include:   Stuffy nose or congestion.   Thick drainage from your nose.   Swelling and warmth over the affected sinuses.   Headache.   Upper toothache.   A cough that may get worse at night.   Extra mucus that collects in the throat or the back of the nose (postnasal drip).   Decreased sense of smell and taste.   Fatigue.   A fever.   Sore throat.   Bad breath.  How is this diagnosed?  This condition is diagnosed based on:   Your symptoms.   Your medical history.   A physical exam.   Tests to find out if your condition is  acute or chronic. This may include:  ? Checking your nose for nasal polyps.  ? Viewing your sinuses using a device that has a light (endoscope).  ? Testing for allergies or bacteria.  ? Imaging tests, such as an MRI or CT scan.  In rare cases, a bone biopsy may be done to rule out more serious types of fungal sinus disease.  How is this treated?  Treatment for sinusitis depends on the cause and whether your condition is chronic or acute.   If caused by a virus, your symptoms should go away on their own within 10 days. You may be given medicines to relieve symptoms. They include:  ? Medicines that shrink swollen nasal passages (topical intranasal decongestants).  ? Medicines that treat allergies (antihistamines).  ? A spray that eases inflammation of the nostrils (topical intranasal corticosteroids).  ? Rinses that help get rid of thick mucus in your nose (nasal saline washes).   If caused by bacteria, your health care provider may recommend waiting to see if your symptoms improve. Most bacterial infections will get better without antibiotic medicine. You may be given antibiotics if you have:  ? A severe infection.  ? A weak immune system.   If caused by narrow nasal passages or nasal polyps, you may need   to have surgery.  Follow these instructions at home:  Medicines   Take, use, or apply over-the-counter and prescription medicines only as told by your health care provider. These may include nasal sprays.   If you were prescribed an antibiotic medicine, take it as told by your health care provider. Do not stop taking the antibiotic even if you start to feel better.  Hydrate and humidify     Drink enough fluid to keep your urine pale yellow. Staying hydrated will help to thin your mucus.   Use a cool mist humidifier to keep the humidity level in your home above 50%.   Inhale steam for 10-15 minutes, 3-4 times a day, or as told by your health care provider. You can do this in the bathroom while a hot shower is  running.   Limit your exposure to cool or dry air.  Rest   Rest as much as possible.   Sleep with your head raised (elevated).   Make sure you get enough sleep each night.  General instructions     Apply a warm, moist washcloth to your face 3-4 times a day or as told by your health care provider. This will help with discomfort.   Wash your hands often with soap and water to reduce your exposure to germs. If soap and water are not available, use hand sanitizer.   Do not smoke. Avoid being around people who are smoking (secondhand smoke).   Keep all follow-up visits as told by your health care provider. This is important.  Contact a health care provider if:   You have a fever.   Your symptoms get worse.   Your symptoms do not improve within 10 days.  Get help right away if:   You have a severe headache.   You have persistent vomiting.   You have severe pain or swelling around your face or eyes.   You have vision problems.   You develop confusion.   Your neck is stiff.   You have trouble breathing.  Summary   Sinusitis is soreness and inflammation of your sinuses. Sinuses are hollow spaces in the bones around your face.   This condition is caused by nasal tissues that become inflamed or swollen. The swelling traps or blocks the flow of mucus. This allows bacteria, viruses, and fungi to grow, which leads to infection.   If you were prescribed an antibiotic medicine, take it as told by your health care provider. Do not stop taking the antibiotic even if you start to feel better.   Keep all follow-up visits as told by your health care provider. This is important.  This information is not intended to replace advice given to you by your health care provider. Make sure you discuss any questions you have with your health care provider.  Document Released: 06/18/2005 Document Revised: 11/18/2017 Document Reviewed: 11/18/2017  [employer] Interactive Patient Education  2019 [employer]

## 2018-08-25 NOTE — Progress Notes (Signed)
Subjective:    Patient ID: Meredith Anderson, female    DOB: 10-Nov-1940, 78 y.o.   MRN: [id number]  Chief Complaint  Patient presents with  . pain in upper gums, forehead    sick for a week  . Cough  . Chills  . Generalized Body Aches    Cough  Associated symptoms include chills, ear pain (left), headaches and a sore throat.  Sinusitis  This is a new problem. The current episode started 1 to 4 weeks ago. The problem has been gradually worsening since onset. There has been no fever. Her pain is at a severity of 6/10. The pain is moderate. Associated symptoms include chills, congestion, coughing, ear pain (left), headaches, a hoarse voice, sinus pressure, a sore throat and swollen glands. Past treatments include lying down and oral decongestants (had keflex at home and started). The treatment provided mild relief.      Review of Systems  Constitutional: Positive for chills.  HENT: Positive for congestion, ear pain (left), hoarse voice, sinus pressure and sore throat.   Respiratory: Positive for cough.   Neurological: Positive for headaches.  All other systems reviewed and are negative.      Objective:   Physical Exam Vitals signs reviewed.  Constitutional:      General: She is not in acute distress.    Appearance: She is well-developed.  HENT:     Head: Normocephalic and atraumatic.     Right Ear: External ear normal.     Nose: Mucosal edema present.     Right Sinus: Frontal sinus tenderness present.     Left Sinus: Frontal sinus tenderness present.     Mouth/Throat:     Pharynx: Posterior oropharyngeal erythema present.  Eyes:     Pupils: Pupils are equal, round, and reactive to light.  Neck:     Musculoskeletal: Normal range of motion and neck supple.     Thyroid: No thyromegaly.  Cardiovascular:     Rate and Rhythm: Normal rate and regular rhythm.     Heart sounds: Normal heart sounds. No murmur.  Pulmonary:     Effort: Pulmonary effort is normal. No respiratory  distress.     Breath sounds: Wheezing present.  Abdominal:     General: Bowel sounds are normal. There is no distension.     Palpations: Abdomen is soft.     Tenderness: There is no abdominal tenderness.  Musculoskeletal: Normal range of motion.        General: No tenderness.  Skin:    General: Skin is warm and dry.  Neurological:     Mental Status: She is alert and oriented to person, place, and time.     Cranial Nerves: No cranial nerve deficit.     Deep Tendon Reflexes: Reflexes are normal and symmetric.  Psychiatric:        Behavior: Behavior normal.        Thought Content: Thought content normal.        Judgment: Judgment normal.       BP 102/62   Pulse 88   Temp 97.9 F (36.6 C) (Oral)   Ht 5\' 4"  (1.626 m)   Wt 165 lb 6.4 oz (75 kg)   BMI 28.39 kg/m      Assessment & Plan:  Meredith Anderson comes in today with chief complaint of pain in upper gums, forehead (sick for a week); Cough; Chills; and Generalized Body Aches   Diagnosis and orders addressed:  1. Flu-like symptoms -  Veritor Flu A/B Waived - HYDROcodone-homatropine (HYCODAN) 5-1.5 MG/5ML syrup; Take 5 mLs by mouth every 8 (eight) hours as needed for cough.  Dispense: 120 mL; Refill: 0  2. Acute frontal sinusitis, recurrence not specified - Take meds as prescribed - Use a cool mist humidifier  -Use saline nose sprays frequently -Force fluids -For any cough or congestion  Use plain Mucinex- regular strength or max strength is fine -For fever or aces or pains- take tylenol or ibuprofen. -Throat lozenges if help -New toothbrush in 3 days RTO if symptoms worsen or do not improve  - amoxicillin-clavulanate (AUGMENTIN) 875-125 MG tablet; Take 1 tablet by mouth 2 (two) times daily.  Dispense: 14 tablet; Refill: 0   Evelina Dun, FNP

## 2018-08-26 ENCOUNTER — Telehealth: Payer: Self-pay | Admitting: Physician Assistant

## 2018-08-26 DIAGNOSIS — R6889 Other general symptoms and signs: Secondary | ICD-10-CM

## 2018-08-26 MED ORDER — HYDROCODONE-HOMATROPINE 5-1.5 MG/5ML PO SYRP: 5.0000 mL | ORAL_SOLUTION | Freq: Three times a day (TID) | ORAL | 0 refills | Status: DC | PRN

## 2018-08-26 NOTE — Telephone Encounter (Signed)
Patient aware Rx has been sent to pharmacy. 

## 2018-08-26 NOTE — Telephone Encounter (Signed)
Prescription sent to pharmacy.

## 2018-09-09 DIAGNOSIS — Z1231 Encounter for screening mammogram for malignant neoplasm of breast: Secondary | ICD-10-CM | POA: Diagnosis not present

## 2018-09-09 LAB — HM MAMMOGRAPHY

## 2018-10-21 ENCOUNTER — Encounter: Payer: Medicare Other | Admitting: *Deleted

## 2018-10-21 ENCOUNTER — Telehealth: Payer: Self-pay | Admitting: Physician Assistant

## 2018-10-25 ENCOUNTER — Other Ambulatory Visit: Payer: Self-pay | Admitting: Physician Assistant

## 2018-10-28 ENCOUNTER — Other Ambulatory Visit: Payer: Self-pay

## 2018-10-28 ENCOUNTER — Ambulatory Visit (INDEPENDENT_AMBULATORY_CARE_PROVIDER_SITE_OTHER): Payer: Medicare Other | Admitting: *Deleted

## 2018-10-28 DIAGNOSIS — Z0000 Encounter for general adult medical examination without abnormal findings: Secondary | ICD-10-CM | POA: Diagnosis not present

## 2018-10-28 NOTE — Progress Notes (Signed)
MEDICARE ANNUAL WELLNESS VISIT  10/28/2018  Telephone Visit Disclaimer This Medicare AWV was conducted by telephone due to national recommendations for restrictions regarding the COVID-19 Pandemic (e.g. social distancing).  I verified, using two identifiers, that I am speaking with Meredith Anderson or their authorized healthcare agent. I discussed the limitations, risks, security, and privacy concerns of performing an evaluation and management service by telephone and the potential availability of an in-person appointment in the future. The patient expressed understanding and agreed to proceed.   Subjective:   Meredith Anderson is a 78 y.o. female patient of Terald Sleeper, PA-C who had a Medicare Annual Wellness Visit today via telephone. Tondalaya is Retired and lives alone. she has 2 children. she reports that she is socially active and does interact with friends and/or family regularly. she is minimally physically active and enjoys talking with her 2 daughters almost daily and watching TV.  Patient Care Team: Theodoro Clock as PCP - General (Physician Assistant) Frederik Pear, MD as Consulting Physician (Internal Medicine) Wyvonnia Dusky, MD as Consulting Physician (Cardiology) Griselda Miner, MD as Consulting Physician (Dermatology)  [facility] Utilization Over the Past 12 Months: # of hospitalizations or ER visits: 0 # of surgeries: 0  Review of Systems    Patient reports that her overall health is better compared to last year.  Patient Reported Readings (BP, Pulse, CBG, Weight, etc) n/a  Review of Systems: N/A  All other systems negative.  Pain Assessment Pain : No/denies pain     Current Medications & Allergies (verified) Allergies as of 10/28/2018      Reactions   Dofetilide Other (See Comments)   Passed out   Doxycycline    diarrhea   Amlodipine    Other reaction(s): Other (See Comments) Swollen ankles   Dronedarone Other (See Comments)   Increased BG    Glipizide-metformin Hcl    Other reaction(s): Diarrhea, Other (See Comments) Weakness   Metformin    Other reaction(s): Diarrhea   Niaspan [niacin] Rash      Medication List       Accurate as of October 28, 2018  2:50 PM. Always use your most recent med list.        Accu-Chek Aviva Plus test strip Generic drug:  glucose blood USE TO CHECK BLOOD SUGAR ONCE A DAY OR AS INSTRUCTED   cholecalciferol 1000 units tablet Commonly known as:  VITAMIN D Take 1,000 Units by mouth daily.   diclofenac sodium 1 % Gel Commonly known as:  VOLTAREN Apply 4 g topically 4 (four) times daily.   diltiazem 120 MG 24 hr capsule Commonly known as:  CARDIZEM CD TAKE 1 CAPSULE BY MOUTH EVERY DAY   diphenhydrAMINE 25 mg capsule Commonly known as:  BENADRYL 25 mg as needed.   escitalopram 10 MG tablet Commonly known as:  LEXAPRO TAKE 1 TABLET DAILY (REPLACES CELEXA)   fexofenadine 180 MG tablet Commonly known as:  ALLEGRA Take 1 tablet (180 mg total) by mouth daily. For allergy symptoms   furosemide 40 MG tablet Commonly known as:  LASIX TAKE 1 TABLET BY MOUTH EVERY DAY   gabapentin 100 MG capsule Commonly known as:  NEURONTIN TAKE 1 CAPSULE BY MOUTH THREE TIMES A DAY   Januvia 100 MG tablet Generic drug:  sitaGLIPtin TAKE 1 TABLET BY MOUTH EVERY DAY   Klor-Con M20 20 MEQ tablet Generic drug:  potassium chloride SA TAKE 1 TABLET BY MOUTH EVERY DAY   levothyroxine 125  MCG tablet Commonly known as:  SYNTHROID TAKE 1 TABLET (125 MCG TOTAL) BY MOUTH DAILY BEFORE BREAKFAST.   lidocaine 2 % jelly Commonly known as:  XYLOCAINE Apply 1 application topically 3 (three) times daily. Apply to hemorrhoid when painful   magnesium oxide 400 MG tablet Commonly known as:  MAG-OX 400 mg.   Premarin vaginal cream Generic drug:  conjugated estrogens USING APPLICATOR PLACE 1 GRAM IN VAGINA FOR 3 WEEKS,THEN USE EVERY OTHER NIGHT AS INSTRUCTED   Proctosol HC 2.5 % rectal cream Generic drug:   hydrocortisone APPLY TO RECTAL AREA EVERY 6 HOURS AS NEEDED FOR HEMORRHOIDS   rosuvastatin 20 MG tablet Commonly known as:  CRESTOR TAKE 1 TABLET BY MOUTH EVERY DAY   TETRAHYDROZOLINE HCL OP Apply to eye. Reported on 01/02/2016   Xarelto 15 MG Tabs tablet Generic drug:  Rivaroxaban TAKE 1 TABLET (15 MG TOTAL) BY MOUTH DAILY WITH SUPPER.       History (reviewed): Past Medical History:  Diagnosis Date  . A-fib ([place])   . Allergy    seasonal  . Anxiety   . Arthritis   . 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 Cataract   . Depression 12/12/2015  . Dyshydrosis   . Hemorrhoids   . Hyperlipidemia   . Hypertension   . Menopause   . NIDDM (non-insulin dependent diabetes mellitus)    diet controlled   . Osteopenia 2016  . Thyroid disease    Past Surgical History:  Procedure Laterality Date  . ABDOMINAL HYSTERECTOMY     partial, prolapse  . ABLATION    . bunionectomy bilateral    . EYE SURGERY Bilateral    cataracts  . LUMBAR DISC SURGERY     L4 and L5  . PACEMAKER INSERTION  12/14/2015  . ROTATOR CUFF REPAIR Right   . TUBAL LIGATION     Family History  Problem Relation Age of Onset  . Stroke Mother   . Diabetes Mother   . Heart disease Father   . Atrial fibrillation Father   . Cancer Sister        ovarian / colon  . Diabetes Sister   . Heart disease Brother   . Hyperlipidemia Brother   . Hypertension Brother   . Diabetes Brother   . Heart attack Brother 83       had to perform emergency surgery  . Dementia Brother   . Hyperlipidemia Sister   . Cancer Sister        liver  . Hyperlipidemia Sister   . Diabetes Sister        [place] DM  . Cancer Sister   . Diabetes Sister   . Diabetes Daughter   . Rheum arthritis Daughter   . Psoriasis Daughter    Social History   Socioeconomic History  . Marital status: Divorced    Spouse name: Not on file  . Number of children: 2  . Years of education: high school graduate  . Highest education level:  High school graduate  Occupational History  . Occupation: Retired  Scientific laboratory technician  . Financial resource strain: Not hard at all  . Food insecurity:    Worry: Never true    Inability: Never true  . Transportation needs:    Medical: No    Non-medical: No  Tobacco Use  . Smoking status: Never Smoker  . Smokeless tobacco: Never Used  Substance and Sexual Activity  . Alcohol use: No  . Drug  use: No  . Sexual activity: Not Currently  Lifestyle  . Physical activity:    Days per week: 0 days    Minutes per session: 0 min  . Stress: Not at all  Relationships  . Social connections:    Talks on phone: More than three times a week    Gets together: More than three times a week    Attends religious service: Never    Active member of club or organization: No    Attends meetings of clubs or organizations: Never    Relationship status: Divorced  Other Topics Concern  . Not on file  Social History Narrative  . Not on file    Activities of Daily Living In your present state of health, do you have any difficulty performing the following activities: 10/28/2018  Hearing? N  Vision? N  Difficulty concentrating or making decisions? N  Walking or climbing stairs? Y  Comment can only climb stairs with use of rails   Dressing or bathing? N  Doing errands, shopping? N  Preparing Food and eating ? N  Using the Toilet? N  In the past six months, have you accidently leaked urine? N  Do you have problems with loss of bowel control? N  Managing your Medications? N  Managing your Finances? N  Housekeeping or managing your Housekeeping? N  Some recent data might be hidden    Patient literacy How often do you need to have someone help you when you read instructions, pamphlets, or other written materials from your doctor or pharmacy?: 1 - Never What is the last grade level you completed in school?: graduated high school  Exercise Current Exercise Habits: The patient does not participate in  regular exercise at present  Diet Patient reports consuming 2 meals a day and 1 snack(s) a day Patient reports that her primary diet is: Diabetic Patient reports that she does have regular access to food.   Depression Screen PHQ 2/9 Scores 10/28/2018 08/25/2018 05/02/2018 04/16/2018 01/21/2018 01/07/2018 10/30/2017  PHQ - 2 Score 0 0 0 0 0 0 0     Fall Risk Fall Risk  10/28/2018 08/25/2018 05/02/2018 04/16/2018 01/21/2018  Falls in the past year? 0 0 0 No Yes  Number falls in past yr: - - - - 1  Comment - - - - -  Injury with Fall? - - - - Yes  Risk Factor Category  - - - - -  Risk for fall due to : - - - - -  Follow up - - - - -     Objective:      Last 3 BP Readings Last Weight Last BMI  BP Readings from Last 3 Encounters:  08/25/18 102/62  05/02/18 (!) 149/87  04/16/18 139/83   Wt Readings from Last 3 Encounters:  08/25/18 165 lb 6.4 oz (75 kg)  05/02/18 165 lb (74.8 kg)  04/16/18 165 lb 12.8 oz (75.2 kg)   BMI Readings from Last 1 Encounters:  08/25/18 28.39 kg/m    *Unable to obtain current vital signs, weight, and BMI due to telephone visit type  Meredith Anderson seemed alert and oriented and she participated appropriately during our telephone visit.  Advanced Directives 10/28/2018 05/23/2016 05/20/2015 05/17/2014  Does Patient Have a Medical Advance Directive? No No No No  Would patient like information on creating a medical advance directive? No - Patient declined No - Patient declined No - patient declined information No - patient declined information  Hearing/Vision  . Ariannie did not seem to have difficulty with hearing/understanding during the telephone conversation . Reports that she has had a formal eye exam by an eye care professional within the past year . Reports that she has not had a formal hearing evaluation within the past year *Unable to fully assess hearing and vision during telephone visit type  Cognitive Function: 6CIT Screen 10/28/2018  What Year? 0  points  What month? 0 points  What time? 0 points  Count back from 20 0 points  Months in reverse 0 points  Repeat phrase 2 points  Total Score 2    Normal Cognitive Function Screening: Yes (Normal:0-7, Significant for Dysfunction: >8)  Immunization & Health Maintenance Record Immunization History  Administered Date(s) Administered  . Influenza Whole 04/01/2012  . Influenza, High Dose Seasonal PF 04/17/2017, 04/16/2018  . Influenza,inj,Quad PF,6+ Mos 04/03/2013, 04/03/2016  . Influenza-Unspecified 03/15/2014  . Pneumococcal Conjugate-13 05/17/2014  . Pneumococcal Polysaccharide-23 08/02/2009  . Tdap 01/31/2012  . Zoster 01/31/2012    Health Maintenance  Topic Date Due  . DEXA SCAN  08/11/2016  . FOOT EXAM  10/17/2017  . HEMOGLOBIN A1C  05/02/2018  . OPHTHALMOLOGY EXAM  01/09/2019  . INFLUENZA VACCINE  01/31/2019  . URINE MICROALBUMIN  05/03/2019  . MAMMOGRAM  09/08/2020  . TETANUS/TDAP  01/30/2022  . PNA vac Low Risk Adult  Completed       Assessment:   This is a routine wellness examination for [employer].  Health Maintenance: Due or Overdue Health Maintenance Due  Topic Date Due  . DEXA SCAN  08/11/2016  . FOOT EXAM  10/17/2017  . HEMOGLOBIN A1C  05/02/2018    Meredith Anderson does not need a referral for Community Assistance: Care Management:   no Social Work:    no Prescription Assistance:  no Nutrition/Diabetes Education:  no   Plan:    Personalized Goals Goals Addressed            This Visit's Progress   . DIET - REDUCE SUGAR INTAKE        Personalized Health Maintenance & Screening Recommendations  N/A  Lung Cancer Screening Recommended: no (Low Dose CT Chest recommended if Age 23-80 years, 30 pack-year currently smoking OR have quit w/in past 15 years) Hepatitis C Screening recommended: no HIV Screening recommended: no  Advanced Directives: Written information was not prepared per patient's request.  Referrals & Orders Placed: No  orders of the defined types were placed in this encounter.   Follow-up Plan . Follow-up with Terald Sleeper, PA-C as planned  I have personally reviewed and noted the following in the patient's chart:   . Medical and social history . Use of alcohol, tobacco or illicit drugs  . Current medications and supplements . Functional ability and status . Nutritional status . Physical activity . Advanced directives . List of other physicians . Hospitalizations, surgeries, and ER visits in previous 12 months . Vitals . Screenings to include cognitive, depression, and falls . Referrals and appointments  In addition, I have reviewed and discussed with Meredith Anderson certain preventive protocols, quality metrics, and best practice recommendations. A written personalized care plan for preventive services as well as general preventive health recommendations is available and can be mailed to the patient at her request.      signature  10/28/2018

## 2018-10-31 ENCOUNTER — Ambulatory Visit: Payer: Medicare Other | Admitting: Physician Assistant

## 2018-11-17 ENCOUNTER — Other Ambulatory Visit: Payer: Self-pay | Admitting: Physician Assistant

## 2018-11-26 ENCOUNTER — Other Ambulatory Visit: Payer: Self-pay | Admitting: Physician Assistant

## 2018-12-13 ENCOUNTER — Other Ambulatory Visit: Payer: Self-pay | Admitting: Physician Assistant

## 2018-12-19 ENCOUNTER — Other Ambulatory Visit: Payer: Self-pay | Admitting: Physician Assistant

## 2018-12-19 NOTE — Telephone Encounter (Signed)
NOV 01/05/19

## 2019-01-05 ENCOUNTER — Other Ambulatory Visit: Payer: Self-pay

## 2019-01-05 ENCOUNTER — Ambulatory Visit (INDEPENDENT_AMBULATORY_CARE_PROVIDER_SITE_OTHER): Payer: Medicare Other | Admitting: Physician Assistant

## 2019-01-05 ENCOUNTER — Encounter: Payer: Self-pay | Admitting: Physician Assistant

## 2019-01-05 VITALS — BP 156/82 | HR 76 | Temp 97.3°F | Ht 64.0 in | Wt 167.0 lb

## 2019-01-05 DIAGNOSIS — E119 Type 2 diabetes mellitus without complications: Secondary | ICD-10-CM | POA: Diagnosis not present

## 2019-01-05 DIAGNOSIS — E039 Hypothyroidism, unspecified: Secondary | ICD-10-CM

## 2019-01-05 DIAGNOSIS — M150 Primary generalized (osteo)arthritis: Secondary | ICD-10-CM

## 2019-01-05 DIAGNOSIS — M159 Polyosteoarthritis, unspecified: Secondary | ICD-10-CM

## 2019-01-05 DIAGNOSIS — I10 Essential (primary) hypertension: Secondary | ICD-10-CM

## 2019-01-05 LAB — BAYER DCA HB A1C WAIVED: HB A1C (BAYER DCA - WAIVED): 7 % — ABNORMAL HIGH (ref <7.0)

## 2019-01-05 MED ORDER — DILTIAZEM HCL ER COATED BEADS 120 MG PO CP24: ORAL_CAPSULE | ORAL | 3 refills | Status: DC

## 2019-01-05 MED ORDER — ACCU-CHEK AVIVA PLUS VI STRP: ORAL_STRIP | 11 refills | Status: DC

## 2019-01-05 MED ORDER — FUROSEMIDE 40 MG PO TABS: 40.0000 mg | ORAL_TABLET | Freq: Every day | ORAL | 3 refills | Status: DC

## 2019-01-05 MED ORDER — DICLOFENAC SODIUM 1 % TD GEL: 4.0000 g | Freq: Four times a day (QID) | TRANSDERMAL | 11 refills | Status: DC

## 2019-01-05 MED ORDER — POTASSIUM CHLORIDE CRYS ER 20 MEQ PO TBCR: 20.0000 meq | EXTENDED_RELEASE_TABLET | Freq: Every day | ORAL | 3 refills | Status: DC

## 2019-01-05 MED ORDER — SITAGLIPTIN PHOSPHATE 100 MG PO TABS: 100.0000 mg | ORAL_TABLET | Freq: Every day | ORAL | 3 refills | Status: DC

## 2019-01-05 MED ORDER — RIVAROXABAN 15 MG PO TABS: 15.0000 mg | ORAL_TABLET | Freq: Every day | ORAL | 3 refills | Status: DC

## 2019-01-05 MED ORDER — GABAPENTIN 100 MG PO CAPS: ORAL_CAPSULE | ORAL | 3 refills | Status: DC

## 2019-01-05 MED ORDER — ROSUVASTATIN CALCIUM 20 MG PO TABS: 20.0000 mg | ORAL_TABLET | Freq: Every day | ORAL | 3 refills | Status: DC

## 2019-01-06 ENCOUNTER — Other Ambulatory Visit: Payer: Self-pay | Admitting: Physician Assistant

## 2019-01-06 LAB — MICROALBUMIN / CREATININE URINE RATIO
Creatinine, Urine: 13.4 mg/dL
Microalb/Creat Ratio: 99 mg/g creat — ABNORMAL HIGH (ref 0–29)
Microalbumin, Urine: 13.2 ug/mL

## 2019-01-06 LAB — CBC WITH DIFFERENTIAL/PLATELET
Basophils Absolute: 0.1 10*3/uL (ref 0.0–0.2)
Basos: 1 %
EOS (ABSOLUTE): 0.2 10*3/uL (ref 0.0–0.4)
Eos: 3 %
Hematocrit: 38.5 % (ref 34.0–46.6)
Hemoglobin: 12.5 g/dL (ref 11.1–15.9)
Immature Grans (Abs): 0 10*3/uL (ref 0.0–0.1)
Immature Granulocytes: 0 %
Lymphocytes Absolute: 1.2 10*3/uL (ref 0.7–3.1)
Lymphs: 28 %
MCH: 30 pg (ref 26.6–33.0)
MCHC: 32.5 g/dL (ref 31.5–35.7)
MCV: 92 fL (ref 79–97)
Monocytes Absolute: 0.5 10*3/uL (ref 0.1–0.9)
Monocytes: 11 %
Neutrophils Absolute: 2.5 10*3/uL (ref 1.4–7.0)
Neutrophils: 57 %
Platelets: 195 10*3/uL (ref 150–450)
RBC: 4.17 x10E6/uL (ref 3.77–5.28)
RDW: 13.6 % (ref 11.7–15.4)
WBC: 4.4 10*3/uL (ref 3.4–10.8)

## 2019-01-06 LAB — TSH: TSH: 1.42 u[IU]/mL (ref 0.450–4.500)

## 2019-01-06 LAB — CMP14+EGFR
ALT: 15 IU/L (ref 0–32)
AST: 18 IU/L (ref 0–40)
Albumin/Globulin Ratio: 1.9 (ref 1.2–2.2)
Albumin: 5 g/dL — ABNORMAL HIGH (ref 3.7–4.7)
Alkaline Phosphatase: 79 IU/L (ref 39–117)
BUN/Creatinine Ratio: 16 (ref 12–28)
BUN: 14 mg/dL (ref 8–27)
Bilirubin Total: 0.8 mg/dL (ref 0.0–1.2)
CO2: 24 mmol/L (ref 20–29)
Calcium: 9.4 mg/dL (ref 8.7–10.3)
Chloride: 100 mmol/L (ref 96–106)
Creatinine, Ser: 0.87 mg/dL (ref 0.57–1.00)
GFR calc Af Amer: 74 mL/min/{1.73_m2} (ref >59)
GFR calc non Af Amer: 64 mL/min/{1.73_m2} (ref >59)
Globulin, Total: 2.6 g/dL (ref 1.5–4.5)
Glucose: 151 mg/dL — ABNORMAL HIGH (ref 65–99)
Potassium: 3.7 mmol/L (ref 3.5–5.2)
Sodium: 139 mmol/L (ref 134–144)
Total Protein: 7.6 g/dL (ref 6.0–8.5)

## 2019-01-06 LAB — LIPID PANEL
Chol/HDL Ratio: 3 ratio (ref 0.0–4.4)
Cholesterol, Total: 99 mg/dL — ABNORMAL LOW (ref 100–199)
HDL: 33 mg/dL — ABNORMAL LOW (ref >39)
LDL Calculated: 41 mg/dL (ref 0–99)
Triglycerides: 124 mg/dL (ref 0–149)
VLDL Cholesterol Cal: 25 mg/dL (ref 5–40)

## 2019-01-06 MED ORDER — LEVOTHYROXINE SODIUM 125 MCG PO TABS: 125.0000 ug | ORAL_TABLET | Freq: Every day | ORAL | 3 refills | Status: DC

## 2019-01-06 NOTE — Progress Notes (Signed)
BP (!) 156/82   Pulse 76   Temp (!) 97.3 F (36.3 C) (Oral)   Ht _0  (1.626 m)   Wt 167 lb (75.8 kg)   BMI 28.67 kg/m    Subjective:    Patient ID: Meredith Anderson, female    DOB: 05-12-1941, 78 y.o.   MRN: [id number]  HPI: Meredith Anderson is a 78 y.o. female presenting on 01/05/2019 for Medical Management of Chronic Issues  The patient has had recheck on her chronic medical conditions which do include hypertension, hypothyroidism, type 2 diabetes, arthritis and sick sinus syndrome.  She is followed at [facility] by cardiology.  However she does need some refills on her medications.  She also needs some labs.  There have been no other issues at this time.  She has been keeping herself safe and in from [place].  Past Medical History:  Diagnosis Date  . A-fib ([place])   . Allergy    seasonal  . Anxiety   . Arthritis   . 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 Cataract   . Depression 12/12/2015  . Dyshydrosis   . Hemorrhoids   . Hyperlipidemia   . Hypertension   . Menopause   . NIDDM (non-insulin dependent diabetes mellitus)    diet controlled   . Osteopenia 2016  . Thyroid disease    Relevant past medical, surgical, family and social history reviewed and updated as indicated. Interim medical history since our last visit reviewed. Allergies and medications reviewed and updated. DATA REVIEWED: CHART IN EPIC  Family History reviewed for pertinent findings.  Review of Systems  Constitutional: Negative.   HENT: Negative.   Eyes: Negative.   Respiratory: Negative.   Gastrointestinal: Negative.   Genitourinary: Negative.   Musculoskeletal: Positive for arthralgias and back pain.    Allergies as of 01/05/2019      Reactions   Dofetilide Other (See Comments)   Passed out   Doxycycline    diarrhea   Amlodipine    Other reaction(s): Other (See Comments) Swollen ankles   Dronedarone Other (See Comments)   Increased BG   Glipizide-metformin Hcl    Other reaction(s):  Diarrhea, Other (See Comments) Weakness   Metformin    Other reaction(s): Diarrhea   Niaspan [niacin] Rash      Medication List       Accurate as of January 05, 2019 11:59 PM. If you have any questions, ask your nurse or doctor.        Accu-Chek Aviva Plus test strip Generic drug: glucose blood USE TO CHECK BLOOD SUGAR ONCE A DAY OR AS INSTRUCTED   cholecalciferol 1000 units tablet Commonly known as: VITAMIN D Take 1,000 Units by mouth daily.   diclofenac sodium 1 % Gel Commonly known as: VOLTAREN Apply 4 g topically 4 (four) times daily.   diltiazem 120 MG 24 hr capsule Commonly known as: CARDIZEM CD TAKE 1 CAPSULE BY MOUTH EVERY DAY What changed:   how much to take  how to take this  when to take this  additional instructions Changed by: Terald Sleeper, PA-C   diphenhydrAMINE 25 mg capsule Commonly known as: BENADRYL 25 mg as needed.   escitalopram 10 MG tablet Commonly known as: LEXAPRO TAKE 1 TABLET DAILY (REPLACES CELEXA)   fexofenadine 180 MG tablet Commonly known as: ALLEGRA Take 1 tablet (180 mg total) by mouth daily. For allergy symptoms   furosemide 40 MG tablet Commonly known as: LASIX  Take 1 tablet (40 mg total) by mouth daily.   gabapentin 100 MG capsule Commonly known as: NEURONTIN TAKE 1 CAPSULE BY MOUTH THREE TIMES A DAY   levothyroxine 125 MCG tablet Commonly known as: SYNTHROID TAKE 1 TABLET (125 MCG TOTAL) BY MOUTH DAILY BEFORE BREAKFAST.   lidocaine 2 % jelly Commonly known as: XYLOCAINE Apply 1 application topically 3 (three) times daily. Apply to hemorrhoid when painful   magnesium oxide 400 MG tablet Commonly known as: MAG-OX 400 mg.   potassium chloride SA 20 MEQ tablet Commonly known as: Klor-Con M20 Take 1 tablet (20 mEq total) by mouth daily. What changed: how much to take Changed by: Terald Sleeper, PA-C   Premarin vaginal cream Generic drug: conjugated estrogens USING APPLICATOR PLACE 1 GRAM IN VAGINA FOR 3  WEEKS,THEN USE EVERY OTHER NIGHT AS INSTRUCTED   Proctosol HC 2.5 % rectal cream Generic drug: hydrocortisone APPLY TO RECTAL AREA EVERY 6 HOURS AS NEEDED FOR HEMORRHOIDS   Rivaroxaban 15 MG Tabs tablet Commonly known as: Xarelto Take 1 tablet (15 mg total) by mouth daily with supper.   rosuvastatin 20 MG tablet Commonly known as: CRESTOR Take 1 tablet (20 mg total) by mouth daily.   sitaGLIPtin 100 MG tablet Commonly known as: Januvia Take 1 tablet (100 mg total) by mouth daily. What changed: how much to take Changed by: Terald Sleeper, PA-C   TETRAHYDROZOLINE HCL OP Apply to eye. Reported on 01/02/2016          Objective:    BP (!) 156/82   Pulse 76   Temp (!) 97.3 F (36.3 C) (Oral)   Ht _0  (1.626 m)   Wt 167 lb (75.8 kg)   BMI 28.67 kg/m   Allergies  Allergen Reactions  . Dofetilide Other (See Comments)    Passed out  . Doxycycline     diarrhea   . Amlodipine     Other reaction(s): Other (See Comments) Swollen ankles  . Dronedarone Other (See Comments)    Increased BG  . Glipizide-Metformin Hcl     Other reaction(s): Diarrhea, Other (See Comments) Weakness  . Metformin     Other reaction(s): Diarrhea  . Niaspan [Niacin] Rash    Wt Readings from Last 3 Encounters:  01/05/19 167 lb (75.8 kg)  08/25/18 165 lb 6.4 oz (75 kg)  05/02/18 165 lb (74.8 kg)    Physical Exam Constitutional:      Appearance: She is well-developed.  HENT:     Head: Normocephalic and atraumatic.  Eyes:     Conjunctiva/sclera: Conjunctivae normal.     Pupils: Pupils are equal, round, and reactive to light.  Cardiovascular:     Rate and Rhythm: Normal rate and regular rhythm.     Heart sounds: Normal heart sounds.  Pulmonary:     Effort: Pulmonary effort is normal.     Breath sounds: Normal breath sounds.  Abdominal:     General: Bowel sounds are normal.     Palpations: Abdomen is soft.  Skin:    General: Skin is warm and dry.     Findings: No rash.  Neurological:      Mental Status: She is alert and oriented to person, place, and time.     Deep Tendon Reflexes: Reflexes are normal and symmetric.  Psychiatric:        Behavior: Behavior normal.        Thought Content: Thought content normal.        Judgment: Judgment normal.  Results for orders placed or performed in visit on 01/05/19  CBC with Differential/Platelet  Result Value Ref Range   WBC 4.4 3.4 - 10.8 x10E3/uL   RBC 4.17 3.77 - 5.28 x10E6/uL   Hemoglobin 12.5 11.1 - 15.9 g/dL   Hematocrit 38.5 34.0 - 46.6 %   MCV 92 79 - 97 fL   MCH 30.0 26.6 - 33.0 pg   MCHC 32.5 31.5 - 35.7 g/dL   RDW 13.6 11.7 - 15.4 %   Platelets 195 150 - 450 x10E3/uL   Neutrophils 57 Not Estab. %   Lymphs 28 Not Estab. %   Monocytes 11 Not Estab. %   Eos 3 Not Estab. %   Basos 1 Not Estab. %   Neutrophils Absolute 2.5 1.4 - 7.0 x10E3/uL   Lymphocytes Absolute 1.2 0.7 - 3.1 x10E3/uL   Monocytes Absolute 0.5 0.1 - 0.9 x10E3/uL   EOS (ABSOLUTE) 0.2 0.0 - 0.4 x10E3/uL   Basophils Absolute 0.1 0.0 - 0.2 x10E3/uL   Immature Granulocytes 0 Not Estab. %   Immature Grans (Abs) 0.0 0.0 - 0.1 x10E3/uL  CMP14+EGFR  Result Value Ref Range   Glucose 151 (H) 65 - 99 mg/dL   BUN 14 8 - 27 mg/dL   Creatinine, Ser 0.87 0.57 - 1.00 mg/dL   GFR calc non Af Amer 64 >59 mL/min/1.73   GFR calc Af Amer 74 >59 mL/min/1.73   BUN/Creatinine Ratio 16 12 - 28   Sodium 139 134 - 144 mmol/L   Potassium 3.7 3.5 - 5.2 mmol/L   Chloride 100 96 - 106 mmol/L   CO2 24 20 - 29 mmol/L   Calcium 9.4 8.7 - 10.3 mg/dL   Total Protein 7.6 6.0 - 8.5 g/dL   Albumin 5.0 (H) 3.7 - 4.7 g/dL   Globulin, Total 2.6 1.5 - 4.5 g/dL   Albumin/Globulin Ratio 1.9 1.2 - 2.2   Bilirubin Total 0.8 0.0 - 1.2 mg/dL   Alkaline Phosphatase 79 39 - 117 IU/L   AST 18 0 - 40 IU/L   ALT 15 0 - 32 IU/L  Microalbumin / creatinine urine ratio  Result Value Ref Range   Creatinine, Urine 13.4 Not Estab. mg/dL   Microalbumin, Urine 13.2 Not Estab. ug/mL    Microalb/Creat Ratio 99 (H) 0 - 29 mg/g creat  Lipid panel  Result Value Ref Range   Cholesterol, Total 99 (L) 100 - 199 mg/dL   Triglycerides 124 0 - 149 mg/dL   HDL 33 (L) >39 mg/dL   VLDL Cholesterol Cal 25 5 - 40 mg/dL   LDL Calculated 41 0 - 99 mg/dL   Chol/HDL Ratio 3.0 0.0 - 4.4 ratio  TSH  Result Value Ref Range   TSH 1.420 0.450 - 4.500 uIU/mL  Bayer DCA Hb A1c Waived  Result Value Ref Range   HB A1C (BAYER DCA - WAIVED) 7.0 (H) <7.0 %      Assessment & Plan:   1. Primary osteoarthritis involving multiple joints - diclofenac sodium (VOLTAREN) 1 % GEL; Apply 4 g topically 4 (four) times daily.  Dispense: 400 g; Refill: 11  2. Essential hypertension - diltiazem (CARDIZEM CD) 120 MG 24 hr capsule; TAKE 1 CAPSULE BY MOUTH EVERY DAY  Dispense: 90 capsule; Refill: 3 - furosemide (LASIX) 40 MG tablet; Take 1 tablet (40 mg total) by mouth daily.  Dispense: 90 tablet; Refill: 3 - potassium chloride SA (KLOR-CON M20) 20 MEQ tablet; Take 1 tablet (20 mEq total) by mouth daily.  Dispense:  90 tablet; Refill: 3 - CBC with Differential/Platelet - CMP14+EGFR - Microalbumin / creatinine urine ratio - Lipid panel  3. Hypothyroidism, unspecified type - TSH  4. Type 2 diabetes mellitus without complication, without long-term current use of insulin (HCC) - sitaGLIPtin (JANUVIA) 100 MG tablet; Take 1 tablet (100 mg total) by mouth daily.  Dispense: 90 tablet; Refill: 3 - CBC with Differential/Platelet - CMP14+EGFR - Microalbumin / creatinine urine ratio - Lipid panel - TSH - Bayer DCA Hb A1c Waived   Continue all other maintenance medications as listed above.  Follow up plan: Return in about 6 months (around 07/08/2019).  Educational handout given for [place] PA-C [place] [address]  [place], [place] [address] [phone]   01/06/2019, 3:52 PM

## 2019-02-03 DIAGNOSIS — I4891 Unspecified atrial fibrillation: Secondary | ICD-10-CM | POA: Diagnosis not present

## 2019-02-03 DIAGNOSIS — Z950 Presence of cardiac pacemaker: Secondary | ICD-10-CM | POA: Diagnosis not present

## 2019-02-03 DIAGNOSIS — Z45010 Encounter for checking and testing of cardiac pacemaker pulse generator [battery]: Secondary | ICD-10-CM | POA: Diagnosis not present

## 2019-02-11 ENCOUNTER — Other Ambulatory Visit: Payer: Self-pay | Admitting: Physician Assistant

## 2019-03-03 ENCOUNTER — Encounter: Payer: Medicare Other | Admitting: *Deleted

## 2019-03-03 LAB — HM DIABETES EYE EXAM

## 2019-03-18 ENCOUNTER — Other Ambulatory Visit: Payer: Self-pay

## 2019-03-19 ENCOUNTER — Ambulatory Visit: Payer: Medicare Other

## 2019-03-24 ENCOUNTER — Other Ambulatory Visit: Payer: Self-pay

## 2019-03-25 ENCOUNTER — Ambulatory Visit (INDEPENDENT_AMBULATORY_CARE_PROVIDER_SITE_OTHER): Payer: Medicare Other

## 2019-03-25 DIAGNOSIS — Z23 Encounter for immunization: Secondary | ICD-10-CM

## 2019-05-06 DIAGNOSIS — I340 Nonrheumatic mitral (valve) insufficiency: Secondary | ICD-10-CM | POA: Diagnosis not present

## 2019-05-06 DIAGNOSIS — I10 Essential (primary) hypertension: Secondary | ICD-10-CM | POA: Diagnosis not present

## 2019-05-06 DIAGNOSIS — R0609 Other forms of dyspnea: Secondary | ICD-10-CM | POA: Diagnosis not present

## 2019-05-06 DIAGNOSIS — Z950 Presence of cardiac pacemaker: Secondary | ICD-10-CM | POA: Diagnosis not present

## 2019-05-06 DIAGNOSIS — I480 Paroxysmal atrial fibrillation: Secondary | ICD-10-CM | POA: Diagnosis not present

## 2019-05-07 DIAGNOSIS — I498 Other specified cardiac arrhythmias: Secondary | ICD-10-CM | POA: Diagnosis not present

## 2019-05-08 ENCOUNTER — Ambulatory Visit (INDEPENDENT_AMBULATORY_CARE_PROVIDER_SITE_OTHER): Payer: Medicare Other | Admitting: Family

## 2019-05-08 ENCOUNTER — Encounter: Payer: Self-pay | Admitting: Family

## 2019-05-08 DIAGNOSIS — J019 Acute sinusitis, unspecified: Secondary | ICD-10-CM | POA: Diagnosis not present

## 2019-05-08 MED ORDER — AMOXICILLIN-POT CLAVULANATE 875-125 MG PO TABS: 1.0000 | ORAL_TABLET | Freq: Two times a day (BID) | ORAL | 0 refills | Status: DC

## 2019-05-08 NOTE — Progress Notes (Signed)
   Virtual Visit via telephone Note Due to COVID-19 pandemic this visit was conducted virtually. This visit type was conducted due to national recommendations for restrictions regarding the COVID-19 Pandemic (e.g. social distancing, sheltering in place) in an effort to limit this patient's exposure and mitigate transmission in our community. All issues noted in this document were discussed and addressed.  A physical exam was not performed with this format.  I connected with Meredith Anderson on 05/08/19 at 4:18 pm by telephone and verified that I am speaking with the correct person using two identifiers. Meredith Anderson is currently located at home and no one is currently with her during visit. The provider, Evelina Dun, FNP is located in their office at time of visit.  I discussed the limitations, risks, security and privacy concerns of performing an evaluation and management service by telephone and the availability of in person appointments. I also discussed with the patient that there may be a patient responsible charge related to this service. The patient expressed understanding and agreed to proceed.   History and Present Illness:  Sinusitis This is a new problem. The current episode started 1 to 4 weeks ago. The problem has been gradually worsening since onset. There has been no fever. Her pain is at a severity of 5/10. The pain is mild. Associated symptoms include congestion, coughing, ear pain, headaches, a hoarse voice, sinus pressure and a sore throat. Past treatments include oral decongestants. The treatment provided mild relief.      Review of Systems  HENT: Positive for congestion, ear pain, hoarse voice, sinus pressure and sore throat.   Respiratory: Positive for cough.   Neurological: Positive for headaches.  All other systems reviewed and are negative.    Observations/Objective: No SOB or distress noted  Assessment and Plan: 1. Acute sinusitis, recurrence not specified,  unspecified location - Take meds as prescribed - Use a cool mist humidifier  -Use saline nose sprays frequently -Force fluids -For any cough or congestion  Use plain Mucinex- regular strength or max strength is fine -For fever or aces or pains- take tylenol or ibuprofen. -Throat lozenges if help -New toothbrush in 3 days Call office today if symptoms worsen or do not improve  - amoxicillin-clavulanate (AUGMENTIN) 875-125 MG tablet; Take 1 tablet by mouth 2 (two) times daily.  Dispense: 14 tablet; Refill: 0     I discussed the assessment and treatment plan with the patient. The patient was provided an opportunity to ask questions and all were answered. The patient agreed with the plan and demonstrated an understanding of the instructions.   The patient was advised to call back or seek an in-person evaluation if the symptoms worsen or if the condition fails to improve as anticipated.  The above assessment and management plan was discussed with the patient. The patient verbalized understanding of and has agreed to the management plan. Patient is aware to call the clinic if symptoms persist or worsen. Patient is aware when to return to the clinic for a follow-up visit. Patient educated on when it is appropriate to go to the emergency department.   Time call ended:  4:26 pm  I provided  8 minutes of non-face-to-face time during this encounter.    Evelina Dun, FNP '

## 2019-05-12 ENCOUNTER — Other Ambulatory Visit: Payer: Self-pay | Admitting: Physician Assistant

## 2019-05-12 ENCOUNTER — Telehealth: Payer: Self-pay | Admitting: Physician Assistant

## 2019-05-12 MED ORDER — HYDROCODONE-HOMATROPINE 5-1.5 MG/5ML PO SYRP: 5.0000 mL | ORAL_SOLUTION | Freq: Three times a day (TID) | ORAL | 0 refills | Status: DC | PRN

## 2019-05-12 NOTE — Telephone Encounter (Signed)
Patient aware.

## 2019-05-12 NOTE — Telephone Encounter (Signed)
sent 

## 2019-05-12 NOTE — Progress Notes (Unsigned)
Hycodan

## 2019-05-12 NOTE — Telephone Encounter (Signed)
What symptoms do you have? Croupy Cough  How long have you been sick? Cough started on Sunday,   Have you been seen for this problem? had televisit on Friday for sinus inf was prescribed amoxicillin would like a cough syrup sent to pharmacy  If your provider decides to give you a prescription, which pharmacy would you like for it to be sent to? [facility]   Patient informed that this information will be sent to the clinical staff for review and that they should receive a follow up call.

## 2019-06-15 DIAGNOSIS — I4891 Unspecified atrial fibrillation: Secondary | ICD-10-CM | POA: Diagnosis not present

## 2019-07-06 DIAGNOSIS — Z029 Encounter for administrative examinations, unspecified: Secondary | ICD-10-CM

## 2019-07-15 DIAGNOSIS — I080 Rheumatic disorders of both mitral and aortic valves: Secondary | ICD-10-CM | POA: Diagnosis not present

## 2019-07-15 DIAGNOSIS — I340 Nonrheumatic mitral (valve) insufficiency: Secondary | ICD-10-CM | POA: Diagnosis not present

## 2019-07-15 DIAGNOSIS — I480 Paroxysmal atrial fibrillation: Secondary | ICD-10-CM | POA: Diagnosis not present

## 2019-07-15 DIAGNOSIS — Z950 Presence of cardiac pacemaker: Secondary | ICD-10-CM | POA: Diagnosis not present

## 2019-07-16 ENCOUNTER — Ambulatory Visit (INDEPENDENT_AMBULATORY_CARE_PROVIDER_SITE_OTHER): Payer: Medicare Other | Admitting: Family Medicine

## 2019-07-16 ENCOUNTER — Encounter: Payer: Self-pay | Admitting: Family Medicine

## 2019-07-16 DIAGNOSIS — J069 Acute upper respiratory infection, unspecified: Secondary | ICD-10-CM

## 2019-07-16 MED ORDER — AZITHROMYCIN 250 MG PO TABS: ORAL_TABLET | ORAL | 0 refills | Status: DC

## 2019-07-16 NOTE — Progress Notes (Signed)
Virtual Visit via Telephone Note  I connected with Meredith Anderson on 07/16/19 at 1:06 PM by telephone and verified that I am speaking with the correct person using two identifiers. Meredith Anderson is currently located at home and nobody is currently with her during this visit. The provider, Loman Brooklyn, FNP is located in their office at time of visit.  I discussed the limitations, risks, security and privacy concerns of performing an evaluation and management service by telephone and the availability of in person appointments. I also discussed with the patient that there may be a patient responsible charge related to this service. The patient expressed understanding and agreed to proceed.  Subjective: PCP: Terald Sleeper, PA-C  Chief Complaint  Patient presents with  . URI   Patient complains of head congestion, runny nose, right ear popping/pressure and postnasal drainage. Also reports it feels like her ear is filling up with fluid. Denies any fever. Onset of symptoms was several days ago, gradually worsening since that time. She is drinking plenty of fluids. Evaluation to date: patient previously advised to use Flonase daily and take antihistamine which she has been doing. Treatment to date: antihistamines and nasal steroids. She has a history of allergies. She does not smoke.    ROS: Per HPI  Current Outpatient Medications:  .  azithromycin (ZITHROMAX Z-PAK) 250 MG tablet, Take 2 tablets (500 mg) PO today, then 1 tablet (250 mg) PO daily x4 days., Disp: 6 tablet, Rfl: 0 .  cholecalciferol (VITAMIN D) 1000 UNITS tablet, Take 1,000 Units by mouth daily., Disp: , Rfl:  .  diclofenac sodium (VOLTAREN) 1 % GEL, Apply 4 g topically 4 (four) times daily., Disp: 400 g, Rfl: 11 .  diltiazem (CARDIZEM CD) 120 MG 24 hr capsule, TAKE 1 CAPSULE BY MOUTH EVERY DAY, Disp: 90 capsule, Rfl: 3 .  diphenhydrAMINE (BENADRYL) 25 mg capsule, 25 mg as needed. , Disp: , Rfl:  .  escitalopram (LEXAPRO) 10 MG  tablet, TAKE 1 TABLET DAILY (REPLACES CELEXA), Disp: 90 tablet, Rfl: 3 .  fexofenadine (ALLEGRA) 180 MG tablet, Take 1 tablet (180 mg total) by mouth daily. For allergy symptoms, Disp: 30 tablet, Rfl: 11 .  furosemide (LASIX) 40 MG tablet, Take 1 tablet (40 mg total) by mouth daily., Disp: 90 tablet, Rfl: 3 .  gabapentin (NEURONTIN) 100 MG capsule, TAKE 1 CAPSULE BY MOUTH THREE TIMES A DAY, Disp: 270 capsule, Rfl: 3 .  glucose blood (ACCU-CHEK AVIVA PLUS) test strip, USE TO CHECK BLOOD SUGAR ONCE A DAY OR AS INSTRUCTED, Disp: 50 each, Rfl: 11 .  levothyroxine (SYNTHROID) 125 MCG tablet, Take 1 tablet (125 mcg total) by mouth daily before breakfast., Disp: 90 tablet, Rfl: 3 .  lidocaine (XYLOCAINE) 2 % jelly, Apply 1 application topically 3 (three) times daily. Apply to hemorrhoid when painful, Disp: 30 mL, Rfl: 1 .  magnesium oxide (MAG-OX) 400 MG tablet, 400 mg., Disp: , Rfl:  .  potassium chloride SA (KLOR-CON M20) 20 MEQ tablet, Take 1 tablet (20 mEq total) by mouth daily., Disp: 90 tablet, Rfl: 3 .  PREMARIN vaginal cream, USING APPLICATOR PLACE 1 GRAM IN VAGINA FOR 3 WEEKS,THEN USE EVERY OTHER NIGHT AS INSTRUCTED, Disp: 30 g, Rfl: 5 .  PROCTOSOL HC 2.5 % rectal cream, APPLY TO RECTAL AREA EVERY 6 HOURS AS NEEDED FOR HEMORRHOIDS, Disp: 28.35 g, Rfl: 1 .  Rivaroxaban (XARELTO) 15 MG TABS tablet, Take 1 tablet (15 mg total) by mouth daily with supper., Disp:  90 tablet, Rfl: 3 .  rosuvastatin (CRESTOR) 20 MG tablet, Take 1 tablet (20 mg total) by mouth daily., Disp: 90 tablet, Rfl: 3 .  sitaGLIPtin (JANUVIA) 100 MG tablet, Take 1 tablet (100 mg total) by mouth daily., Disp: 90 tablet, Rfl: 3 .  TETRAHYDROZOLINE HCL OP, Apply to eye. Reported on 01/02/2016, Disp: , Rfl:   Allergies  Allergen Reactions  . Dofetilide Other (See Comments)    Passed out  . Doxycycline     diarrhea   . Amlodipine     Other reaction(s): Other (See Comments) Swollen ankles  . Dronedarone Other (See Comments)     Increased BG  . Glipizide-Metformin Hcl     Other reaction(s): Diarrhea, Other (See Comments) Weakness  . Metformin     Other reaction(s): Diarrhea  . Niaspan [Niacin] Rash   Past Medical History:  Diagnosis Date  . A-fib ([place])   . Allergy    seasonal  . Anxiety   . Arthritis   . 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 Cataract   . Depression 12/12/2015  . Dyshydrosis   . Hemorrhoids   . Hyperlipidemia   . Hypertension   . Menopause   . NIDDM (non-insulin dependent diabetes mellitus)    diet controlled   . Osteopenia 2016  . Thyroid disease     Observations/Objective: A&O  No respiratory distress or wheezing audible over the phone Mood, judgement, and thought processes all WNL  Assessment and Plan: 1. Upper respiratory tract infection, unspecified type - Discussed symptom management. Encouraged to continue Flonase and antihistamine daily.  - azithromycin (ZITHROMAX Z-PAK) 250 MG tablet; Take 2 tablets (500 mg) PO today, then 1 tablet (250 mg) PO daily x4 days.  Dispense: 6 tablet; Refill: 0   Follow Up Instructions:  I discussed the assessment and treatment plan with the patient. The patient was provided an opportunity to ask questions and all were answered. The patient agreed with the plan and demonstrated an understanding of the instructions.   The patient was advised to call back or seek an in-person evaluation if the symptoms worsen or if the condition fails to improve as anticipated.  The above assessment and management plan was discussed with the patient. The patient verbalized understanding of and has agreed to the management plan. Patient is aware to call the clinic if symptoms persist or worsen. Patient is aware when to return to the clinic for a follow-up visit. Patient educated on when it is appropriate to go to the emergency department.   Time call ended: 1:14 PM  I provided 11 minutes of non-face-to-face time during this encounter.  Hendricks Limes, MSN, APRN, FNP-C [facility] 07/16/19

## 2019-09-03 ENCOUNTER — Telehealth: Payer: Self-pay | Admitting: Physician Assistant

## 2019-09-03 NOTE — Chronic Care Management (AMB) (Signed)
°  Chronic Care Management   Outreach Note  09/03/2019 Name: Meredith Anderson MRN: [id number] DOB: [birth year]  Meredith Anderson is a 79 y.o. year old female who is a primary care patient of Terald Sleeper, PA-C. I reached out to [name] by phone today in response to a referral sent by [name]'s health plan.     An unsuccessful telephone outreach was attempted today. The patient was referred to the case management team for assistance with care management and care coordination.   Follow Up Plan: A HIPPA compliant phone message was left for the patient providing contact information and requesting a return call.  The care management team will reach out to the patient again over the next 7 days.  If patient returns call to provider office, please advise to call [facility]  at [place], [place], [place], [place] [address] Direct Dial: [phone] [email or web address] Website: [email or web address]

## 2019-09-08 NOTE — Chronic Care Management (AMB) (Signed)
°  Chronic Care Management   Outreach Note  09/08/2019 Name: [name] MRN: [id number] DOB: 03-Feb-1941  Meredith Anderson is a 79 y.o. year old female who is a primary care patient of Terald Sleeper, PA-C. I reached out to [name] by phone today in response to a referral sent by Meredith Anderson's health plan.     A second unsuccessful telephone outreach was attempted today. The patient was referred to the case management team for assistance with care management and care coordination.   Follow Up Plan: A HIPPA compliant phone message was left for the patient providing contact information and requesting a return call.  The care management team will reach out to the patient again over the next 7 days.  If patient returns call to provider office, please advise to call [facility]  at [place], [place], [place], [place] [address] Direct Dial: [phone] [email or web address] Website: [email or web address]

## 2019-09-10 NOTE — Chronic Care Management (AMB) (Signed)
  Chronic Care Management   Note  09/10/2019 Name: Meredith Anderson MRN: [id number] DOB: 1941/03/02  Meredith Anderson is a 79 y.o. year old female who is a primary care patient of Terald Sleeper, PA-C. I reached out to [name] by phone today in response to a referral sent by [name]'s health plan.     [name] was given information about Chronic Care Management services today including:  1. CCM service includes personalized support from designated clinical staff supervised by her physician, including individualized plan of care and coordination with other care providers 2. 24/7 contact phone numbers for assistance for urgent and routine care needs. 3. Service will only be billed when office clinical staff spend 20 minutes or more in a month to coordinate care. 4. Only one practitioner may furnish and bill the service in a calendar month. 5. The patient may stop CCM services at any time (effective at the end of the month) by phone call to the office staff. 6. The patient will be responsible for cost sharing (co-pay) of up to 20% of the service fee (after annual deductible is met).  Patient did not agree to enrollment in care management services and does not wish to consider at this time.  Follow up plan: The patient has been provided with contact information for the care management team and has been advised to call with any health related questions or concerns.   Noreene Larsson, [place], [place], [place] [address] Direct Dial: [phone] [email or web address] Website: [email or web address]

## 2019-09-17 DIAGNOSIS — Z950 Presence of cardiac pacemaker: Secondary | ICD-10-CM | POA: Diagnosis not present

## 2019-09-17 DIAGNOSIS — Z45018 Encounter for adjustment and management of other part of cardiac pacemaker: Secondary | ICD-10-CM | POA: Diagnosis not present

## 2019-09-17 DIAGNOSIS — I4891 Unspecified atrial fibrillation: Secondary | ICD-10-CM | POA: Diagnosis not present

## 2019-10-29 ENCOUNTER — Ambulatory Visit (INDEPENDENT_AMBULATORY_CARE_PROVIDER_SITE_OTHER): Payer: Medicare Other | Admitting: *Deleted

## 2019-10-29 VITALS — Ht 64.0 in | Wt 167.1 lb

## 2019-10-29 DIAGNOSIS — Z0000 Encounter for general adult medical examination without abnormal findings: Secondary | ICD-10-CM

## 2019-10-29 NOTE — Progress Notes (Signed)
MEDICARE ANNUAL WELLNESS VISIT  10/29/2019  Telephone Visit Disclaimer This Medicare AWV was conducted by telephone due to national recommendations for restrictions regarding the COVID-19 Pandemic (e.g. social distancing).  I verified, using two identifiers, that I am speaking with Meredith Anderson or their authorized healthcare agent. I discussed the limitations, risks, security, and privacy concerns of performing an evaluation and management service by telephone and the potential availability of an in-person appointment in the future. The patient expressed understanding and agreed to proceed.   Subjective:  Meredith Anderson is a 79 y.o. female patient of Janora Norlander, DO who had a Medicare Annual Wellness Visit today via telephone. Meredith Anderson is Retired and lives alone. she has 2 children. she reports that she is socially active and does interact with friends/family regularly. she is not physically active and enjoys music.  Patient Care Team: Janora Norlander, DO as PCP - General (Family Medicine) Frederik Pear, MD as Consulting Physician (Internal Medicine) Wyvonnia Dusky, MD as Consulting Physician (Cardiology) Griselda Miner, MD as Consulting Physician (Dermatology)  Advanced Directives 10/29/2019 10/28/2018 05/23/2016 05/20/2015 05/17/2014  Does Patient Have a Medical Advance Directive? No No No No No  Would patient like information on creating a medical advance directive? Yes (MAU/Ambulatory/Procedural Areas - Information given) No - Patient declined No - Patient declined No - patient declined information No - patient declined information    Hospital Utilization Over the Past 12 Months: # of hospitalizations or ER visits: 0 # of surgeries: 0  Review of Systems    Patient reports that her overall health is unchanged compared to last year.  History obtained from chart review and the patient General ROS: negative  Patient Reported Readings (BP, Pulse, CBG, Weight,  etc) none  Pain Assessment Pain : No/denies pain     Current Medications & Allergies (verified) Allergies as of 10/29/2019      Reactions   Dofetilide Other (See Comments)   Passed out   Doxycycline    diarrhea   Amlodipine    Other reaction(s): Other (See Comments) Swollen ankles   Dronedarone Other (See Comments)   Increased BG   Glipizide-metformin Hcl    Other reaction(s): Diarrhea, Other (See Comments) Weakness   Metformin    Other reaction(s): Diarrhea   Niaspan [niacin] Rash      Medication List       Accurate as of October 29, 2019  3:06 PM. If you have any questions, ask your nurse or doctor.        STOP taking these medications   azithromycin 250 MG tablet Commonly known as: Zithromax Z-Pak   diclofenac sodium 1 % Gel Commonly known as: VOLTAREN   diphenhydrAMINE 25 mg capsule Commonly known as: BENADRYL     TAKE these medications   Accu-Chek Aviva Plus test strip Generic drug: glucose blood USE TO CHECK BLOOD SUGAR ONCE A DAY OR AS INSTRUCTED   cholecalciferol 1000 units tablet Commonly known as: VITAMIN D Take 1,000 Units by mouth daily.   diltiazem 120 MG 24 hr capsule Commonly known as: CARDIZEM CD TAKE 1 CAPSULE BY MOUTH EVERY DAY   escitalopram 10 MG tablet Commonly known as: LEXAPRO TAKE 1 TABLET DAILY (REPLACES CELEXA) What changed: See the new instructions.   fexofenadine 180 MG tablet Commonly known as: ALLEGRA Take 1 tablet (180 mg total) by mouth daily. For allergy symptoms   furosemide 40 MG tablet Commonly known as: LASIX Take 1 tablet (40 mg total) by  mouth daily.   gabapentin 100 MG capsule Commonly known as: NEURONTIN TAKE 1 CAPSULE BY MOUTH THREE TIMES A DAY   levothyroxine 125 MCG tablet Commonly known as: SYNTHROID Take 1 tablet (125 mcg total) by mouth daily before breakfast.   lidocaine 2 % jelly Commonly known as: XYLOCAINE Apply 1 application topically 3 (three) times daily. Apply to hemorrhoid when  painful   magnesium oxide 400 MG tablet Commonly known as: MAG-OX 400 mg.   potassium chloride SA 20 MEQ tablet Commonly known as: Klor-Con M20 Take 1 tablet (20 mEq total) by mouth daily.   Premarin vaginal cream Generic drug: conjugated estrogens USING APPLICATOR PLACE 1 GRAM IN VAGINA FOR 3 WEEKS,THEN USE EVERY OTHER NIGHT AS INSTRUCTED   Proctosol HC 2.5 % rectal cream Generic drug: hydrocortisone APPLY TO RECTAL AREA EVERY 6 HOURS AS NEEDED FOR HEMORRHOIDS   Rivaroxaban 15 MG Tabs tablet Commonly known as: Xarelto Take 1 tablet (15 mg total) by mouth daily with supper.   rosuvastatin 20 MG tablet Commonly known as: CRESTOR Take 1 tablet (20 mg total) by mouth daily.   sitaGLIPtin 100 MG tablet Commonly known as: Januvia Take 1 tablet (100 mg total) by mouth daily.   TETRAHYDROZOLINE HCL OP Apply to eye. Reported on 01/02/2016       History (reviewed): Past Medical History:  Diagnosis Date  . A-fib ([place])   . Allergy    seasonal  . Anxiety   . Arthritis   . 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 Cataract   . Depression 12/12/2015  . Dyshydrosis   . Hemorrhoids   . Hyperlipidemia   . Hypertension   . Menopause   . NIDDM (non-insulin dependent diabetes mellitus)    diet controlled   . Osteopenia 2016  . Thyroid disease    Past Surgical History:  Procedure Laterality Date  . ABDOMINAL HYSTERECTOMY     partial, prolapse  . ABLATION    . bunionectomy bilateral    . EYE SURGERY Bilateral    cataracts  . LUMBAR DISC SURGERY     L4 and L5  . PACEMAKER INSERTION  12/14/2015  . ROTATOR CUFF REPAIR Right   . TUBAL LIGATION     Family History  Problem Relation Age of Onset  . Stroke Mother   . Diabetes Mother   . Heart disease Father   . Atrial fibrillation Father   . Cancer Sister        ovarian / colon  . Diabetes Sister   . Heart disease Brother   . Hyperlipidemia Brother   . Hypertension Brother   . Diabetes Brother   . Heart attack  Brother 101       had to perform emergency surgery  . Dementia Brother   . Hyperlipidemia Sister   . Cancer Sister        liver  . Hyperlipidemia Sister   . Diabetes Sister        [place] DM  . Cancer Sister   . Diabetes Sister   . Diabetes Daughter   . Rheum arthritis Daughter   . Psoriasis Daughter    Social History   Socioeconomic History  . Marital status: Divorced    Spouse name: Not on file  . Number of children: 2  . Years of education: 33  . Highest education level: High school graduate  Occupational History  . Occupation: Retired  Tobacco Use  . Smoking status: Never Smoker  .  Smokeless tobacco: Never Used  Substance and Sexual Activity  . Alcohol use: No  . Drug use: No  . Sexual activity: Not Currently  Other Topics Concern  . Not on file  Social History Narrative  . Not on file   Social Determinants of Health   Financial Resource Strain: Low Risk   . Difficulty of Paying Living Expenses: Not hard at all  Food Insecurity: No Food Insecurity  . Worried About Charity fundraiser in the Last Year: Never true  . Ran Out of Food in the Last Year: Never true  Transportation Needs: No Transportation Needs  . Lack of Transportation (Medical): No  . Lack of Transportation (Non-Medical): No  Physical Activity: Inactive  . Days of Exercise per Week: 0 days  . Minutes of Exercise per Session: 0 min  Stress: No Stress Concern Present  . Feeling of Stress : Only a little  Social Connections: Moderately Isolated  . Frequency of Communication with Friends and Family: More than three times a week  . Frequency of Social Gatherings with Friends and Family: More than three times a week  . Attends Religious Services: Never  . Active Member of Clubs or Organizations: No  . Attends Archivist Meetings: Never  . Marital Status: Divorced    Activities of Daily Living In your present state of health, do you have any difficulty performing the following  activities: 10/29/2019  Hearing? Y  Vision? N  Difficulty concentrating or making decisions? N  Walking or climbing stairs? Y  Dressing or bathing? N  Doing errands, shopping? N  Preparing Food and eating ? N  Using the Toilet? N  In the past six months, have you accidently leaked urine? N  Do you have problems with loss of bowel control? N  Managing your Medications? N  Managing your Finances? N  Housekeeping or managing your Housekeeping? N  Some recent data might be hidden    Patient Education/ Literacy How often do you need to have someone help you when you read instructions, pamphlets, or other written materials from your doctor or pharmacy?: 1 - Never What is the last grade level you completed in school?: 12th Grade  Exercise Current Exercise Habits: The patient does not participate in regular exercise at present, Exercise limited by: None identified  Diet Patient reports consuming 3 meals a day and 2 snack(s) a day Patient reports that her primary diet is: Regular Patient reports that she does have regular access to food.   Depression Screen PHQ 2/9 Scores 10/29/2019 01/05/2019 10/28/2018 08/25/2018 05/02/2018 04/16/2018 01/21/2018  PHQ - 2 Score 0 0 0 0 0 0 0     Fall Risk Fall Risk  10/29/2019 01/05/2019 10/28/2018 08/25/2018 05/02/2018  Falls in the past year? 0 0 0 0 0  Number falls in past yr: - - - - -  Comment - - - - -  Injury with Fall? 0 - - - -  Risk Factor Category  - - - - -  Risk for fall due to : No Fall Risks - - - -  Follow up Falls evaluation completed - - - -     Objective:  Meredith Anderson seemed alert and oriented and she participated appropriately during our telephone visit.  Blood Pressure Weight BMI  BP Readings from Last 3 Encounters:  01/05/19 (!) 156/82  08/25/18 102/62  05/02/18 (!) 149/87   Wt Readings from Last 3 Encounters:  10/29/19 167 lb 1.7 oz (  75.8 kg)  01/05/19 167 lb (75.8 kg)  08/25/18 165 lb 6.4 oz (75 kg)   BMI Readings from Last  1 Encounters:  10/29/19 28.68 kg/m    *Unable to obtain current vital signs, weight, and BMI due to telephone visit type  Hearing/Vision  . Meredith Anderson did  seem to have difficulty with hearing/understanding during the telephone conversation . Reports that she has had a formal eye exam by an eye care professional within the past year . Reports that she has not had a formal hearing evaluation within the past year *Unable to fully assess hearing and vision during telephone visit type  Cognitive Function: 6CIT Screen 10/29/2019 10/28/2018  What Year? 0 points 0 points  What month? 0 points 0 points  What time? 0 points 0 points  Count back from 20 0 points 0 points  Months in reverse 0 points 0 points  Repeat phrase 0 points 2 points  Total Score 0 2   (Normal:0-7, Significant for Dysfunction: >8)  Normal Cognitive Function Screening: Yes   Immunization & Health Maintenance Record Immunization History  Administered Date(s) Administered  . Fluad Quad(high Dose 65+) 03/25/2019  . Influenza Whole 04/01/2012  . Influenza, High Dose Seasonal PF 04/17/2017, 04/16/2018  . Influenza,inj,Quad PF,6+ Mos 04/03/2013, 04/03/2016  . Influenza-Unspecified 03/15/2014  . Moderna SARS-COVID-2 Vaccination 07/21/2019, 08/18/2019  . Pneumococcal Conjugate-13 05/17/2014  . Pneumococcal Polysaccharide-23 08/02/2009  . Tdap 01/31/2012  . Zoster 01/31/2012    Health Maintenance  Topic Date Due  . DEXA SCAN  08/11/2016  . FOOT EXAM  10/17/2017  . HEMOGLOBIN A1C  07/08/2019  . URINE MICROALBUMIN  01/05/2020  . INFLUENZA VACCINE  01/31/2020  . OPHTHALMOLOGY EXAM  03/02/2020  . MAMMOGRAM  09/08/2020  . TETANUS/TDAP  01/30/2022  . COVID-19 Vaccine  Completed  . PNA vac Low Risk Adult  Completed       Assessment  This is a routine wellness examination for Meredith Anderson.  Health Maintenance: Due or Overdue Health Maintenance Due  Topic Date Due  . DEXA SCAN  08/11/2016  . FOOT EXAM  10/17/2017  .  HEMOGLOBIN A1C  07/08/2019    Meredith Anderson does not need a referral for Community Assistance: Care Management:   no Social Work:    no Prescription Assistance:  no Nutrition/Diabetes Education:  no   Plan:  Personalized Goals Goals Addressed            This Visit's Progress   . Exercise 3x per week (30 min per time)       10/29/2019 AWV Goal: Exercise for General Health   Patient will verbalize understanding of the benefits of increased physical activity:  Exercising regularly is important. It will improve your overall fitness, flexibility, and endurance.  Regular exercise also will improve your overall health. It can help you control your weight, reduce stress, and improve your bone density.  Over the next year, patient will increase physical activity as tolerated with a goal of at least 150 minutes of moderate physical activity per week.   You can tell that you are exercising at a moderate intensity if your heart starts beating faster and you start breathing faster but can still hold a conversation.  Moderate-intensity exercise ideas include:  Walking 1 mile (1.6 km) in about 15 minutes  Biking  Hiking  Golfing  Dancing  Water aerobics  Patient will verbalize understanding of everyday activities that increase physical activity by providing examples like the following: ? Yard work,  such as: ? Pushing a Conservation officer, nature ? Raking and bagging leaves ? Washing your car ? Pushing a stroller ? Shoveling snow ? Gardening ? Washing windows or floors  Patient will be able to explain general safety guidelines for exercising:   Before you start a new exercise program, talk with your health care provider.  Do not exercise so much that you hurt yourself, feel dizzy, or get very short of breath.  Wear comfortable clothes and wear shoes with good support.  Drink plenty of water while you exercise to prevent dehydration or heat stroke.  Work out until your breathing and  your heartbeat get faster.     . Have 3 meals a day       Eat at least 3 meals daily that consist of proteins, fruits and vegetables Increase water intake+   10/29/2019 AWV Goal: Improved Nutrition/Diet  . Patient will verbalize understanding that diet plays an important role in overall health and that a poor diet is a risk factor for many chronic medical conditions.  . Over the next year, patient will improve self management of their diet by incorporating decreased fat intake, fewer sweetened foods & beverages, increased physical activity, improved protein intake, better food choices, and adequate fluid intake (at least 6 cups of fluid per day). . Patient will utilize available community resources to help with food acquisition if needed (ex: food pantries, [address], etc) . Patient will work with nutrition specialist if a referral was made        Personalized Health Maintenance & Screening Recommendations  Bone densitometry screening Diabetes screening Advanced directives: has NO advanced directive  - add't info requested. Referral to SW: no Shingrix  Lung Cancer Screening Recommended: no (Low Dose CT Chest recommended if Age 33-80 years, 30 pack-year currently smoking OR have quit w/in past 15 years) Hepatitis C Screening recommended: no HIV Screening recommended: no  Advanced Directives: Written information was prepared per patient's request.  Referrals & Orders No orders of the defined types were placed in this encounter.   Follow-up Plan . Follow-up with Janora Norlander, DO as planned . DEXA to be done at office visit  . Will check on price of Shingrix at next office visit  . Will need to have diabetic foot exam and hgb A1C done at office visit    I have personally reviewed and noted the following in the patient's chart:   . Medical and social history . Use of alcohol, tobacco or illicit drugs  . Current medications and supplements . Functional ability and  status . Nutritional status . Physical activity . Advanced directives . List of other physicians . Hospitalizations, surgeries, and ER visits in previous 12 months . Vitals . Screenings to include cognitive, depression, and falls . Referrals and appointments  In addition, I have reviewed and discussed with Meredith Anderson certain preventive protocols, quality metrics, and best practice recommendations. A written personalized care plan for preventive services as well as general preventive health recommendations is available and can be mailed to the patient at her request.      Wardell Heath, LPN  [id number]   AVS Printed and mailed to patient

## 2019-10-29 NOTE — Patient Instructions (Signed)
[facility] Maintenance Summary and Written Plan of Care  Ms. Mcfayden ,  Thank you for allowing me to perform your Medicare Annual Wellness Visit and for your ongoing commitment to your health.   Health Maintenance & Immunization History Health Maintenance  Topic Date Due  . DEXA SCAN  08/11/2016  . FOOT EXAM  10/17/2017  . HEMOGLOBIN A1C  07/08/2019  . URINE MICROALBUMIN  01/05/2020  . INFLUENZA VACCINE  01/31/2020  . OPHTHALMOLOGY EXAM  03/02/2020  . MAMMOGRAM  09/08/2020  . TETANUS/TDAP  01/30/2022  . COVID-19 Vaccine  Completed  . PNA vac Low Risk Adult  Completed   Immunization History  Administered Date(s) Administered  . Fluad Quad(high Dose 65+) 03/25/2019  . Influenza Whole 04/01/2012  . Influenza, High Dose Seasonal PF 04/17/2017, 04/16/2018  . Influenza,inj,Quad PF,6+ Mos 04/03/2013, 04/03/2016  . Influenza-Unspecified 03/15/2014  . Moderna SARS-COVID-2 Vaccination 07/21/2019, 08/18/2019  . Pneumococcal Conjugate-13 05/17/2014  . Pneumococcal Polysaccharide-23 08/02/2009  . Tdap 01/31/2012  . Zoster 01/31/2012    These are the patient goals that we discussed: Goals Addressed            This Visit's Progress   . Exercise 3x per week (30 min per time)       10/29/2019 AWV Goal: Exercise for General Health   Patient will verbalize understanding of the benefits of increased physical activity:  Exercising regularly is important. It will improve your overall fitness, flexibility, and endurance.  Regular exercise also will improve your overall health. It can help you control your weight, reduce stress, and improve your bone density.  Over the next year, patient will increase physical activity as tolerated with a goal of at least 150 minutes of moderate physical activity per week.   You can tell that you are exercising at a moderate intensity if your heart starts beating faster and you start breathing faster but can still hold a  conversation.  Moderate-intensity exercise ideas include:  Walking 1 mile (1.6 km) in about 15 minutes  Biking  Hiking  Golfing  Dancing  Water aerobics  Patient will verbalize understanding of everyday activities that increase physical activity by providing examples like the following: ? Yard work, such as: ? Pushing a Conservation officer, nature ? Raking and bagging leaves ? Washing your car ? Pushing a stroller ? Shoveling snow ? Gardening ? Washing windows or floors  Patient will be able to explain general safety guidelines for exercising:   Before you start a new exercise program, talk with your health care provider.  Do not exercise so much that you hurt yourself, feel dizzy, or get very short of breath.  Wear comfortable clothes and wear shoes with good support.  Drink plenty of water while you exercise to prevent dehydration or heat stroke.  Work out until your breathing and your heartbeat get faster.     . Have 3 meals a day       Eat at least 3 meals daily that consist of proteins, fruits and vegetables Increase water intake+   10/29/2019 AWV Goal: Improved Nutrition/Diet  . Patient will verbalize understanding that diet plays an important role in overall health and that a poor diet is a risk factor for many chronic medical conditions.  . Over the next year, patient will improve self management of their diet by incorporating decreased fat intake, fewer sweetened foods & beverages, increased physical activity, improved protein intake, better food choices, and adequate fluid intake (at least 6 cups  of fluid per day). . Patient will utilize available community resources to help with food acquisition if needed (ex: food pantries, [address], etc) . Patient will work with nutrition specialist if a referral was made          This is a list of Health Maintenance Items that are overdue or due now: Health Maintenance Due  Topic Date Due  . DEXA SCAN  08/11/2016  . FOOT EXAM   10/17/2017  . HEMOGLOBIN A1C  07/08/2019     Orders/Referrals Placed Today: No orders of the defined types were placed in this encounter.  (Contact our referral department at [phone] if you have not spoken with someone about your referral appointment within the next 5 days)    Follow-up Plan Follow up with Dr. Lajuana Ripple as scheduled on 12/01/2019 DEXA (Bone Density) Scan to be done at Office Visit on 12/01/2019 Will check on the price of Shingrix (Shingles Vaccine) at office visit on 12/01/2019 Look over Advance Directive information below and discuss with Family   Advance Directive  Advance directives are legal documents that let you make choices ahead of time about your health care and medical treatment in case you become unable to communicate for yourself. Advance directives are a way for you to make known your wishes to family, friends, and health care providers. This can let others know about your end-of-life care if you become unable to communicate. Discussing and writing advance directives should happen over time rather than all at once. Advance directives can be changed depending on your situation and what you want, even after you have signed the advance directives. There are different types of advance directives, such as:  Medical power of attorney.  Living will.  Do not resuscitate (DNR) or do not attempt resuscitation (DNAR) order. Health care proxy and medical power of attorney A health care proxy is also called a health care agent. This is a person who is appointed to make medical decisions for you in cases where you are unable to make the decisions yourself. Generally, people choose someone they know well and trust to represent their preferences. Make sure to ask this person for an agreement to act as your proxy. A proxy may have to exercise judgment in the event of a medical decision for which your wishes are not known. A medical power of attorney is a legal  document that names your health care proxy. Depending on the laws in your state, after the document is written, it may also need to be:  Signed.  Notarized.  Dated.  Copied.  Witnessed.  Incorporated into your medical record. You may also want to appoint someone to manage your money in a situation in which you are unable to do so. This is called a durable power of attorney for finances. It is a separate legal document from the durable power of attorney for health care. You may choose the same person or someone different from your health care proxy to act as your agent in money matters. If you do not appoint a proxy, or if there is a concern that the proxy is not acting in your best interests, a court may appoint a guardian to act on your behalf. Living will A living will is a set of instructions that state your wishes about medical care when you cannot express them yourself. Health care providers should keep a copy of your living will in your medical record. You may want to give a copy to family members  or friends. To alert caregivers in case of an emergency, you can place a card in your wallet to let them know that you have a living will and where they can find it. A living will is used if you become:  Terminally ill.  Disabled.  Unable to communicate or make decisions. Items to consider in your living will include:  To use or not to use life-support equipment, such as dialysis machines and breathing machines (ventilators).  A DNR or DNAR order. This tells health care providers not to use cardiopulmonary resuscitation (CPR) if breathing or heartbeat stops.  To use or not to use tube feeding.  To be given or not to be given food and fluids.  Comfort (palliative) care when the goal becomes comfort rather than a cure.  Donation of organs and tissues. A living will does not give instructions for distributing your money and property if you should pass away. DNR or DNAR A DNR or DNAR  order is a request not to have CPR in the event that your heart stops beating or you stop breathing. If a DNR or DNAR order has not been made and shared, a health care provider will try to help any patient whose heart has stopped or who has stopped breathing. If you plan to have surgery, talk with your health care provider about how your DNR or DNAR order will be followed if problems occur. What if I do not have an advance directive? If you do not have an advance directive, some states assign family decision makers to act on your behalf based on how closely you are related to them. Each state has its own laws about advance directives. You may want to check with your health care provider, attorney, or state representative about the laws in your state. Summary  Advance directives are the legal documents that allow you to make choices ahead of time about your health care and medical treatment in case you become unable to tell others about your care.  The process of discussing and writing advance directives should happen over time. You can change the advance directives, even after you have signed them.  Advance directives include DNR or DNAR orders, living wills, and designating an agent as your medical power of attorney. This information is not intended to replace advice given to you by your health care provider. Make sure you discuss any questions you have with your health care provider. Document Revised: 01/15/2019 Document Reviewed: 01/15/2019 Elsevier Patient Education  [place].    Bone Density Test The bone density test uses a special type of X-ray to measure the amount of calcium and other minerals in your bones. It can measure bone density in the hip and the spine. The test procedure is similar to having a regular X-ray. This test may also be called:  Bone densitometry.  Bone mineral density test.  Dual-energy X-ray absorptiometry (DEXA). You may have this test to:  Diagnose  a condition that causes weak or thin bones (osteoporosis).  Screen you for osteoporosis.  Predict your risk for a broken bone (fracture).  Determine how well your osteoporosis treatment is working. Tell a health care provider about:  Any allergies you have.  All medicines you are taking, including vitamins, herbs, eye drops, creams, and over-the-counter medicines.  Any problems you or family members have had with anesthetic medicines.  Any blood disorders you have.  Any surgeries you have had.  Any medical conditions you have.  Whether you are  pregnant or may be pregnant.  Any medical tests you have had within the past 14 days that used contrast material. What are the risks? Generally, this is a safe procedure. However, it does expose you to a small amount of radiation, which can slightly increase your cancer risk. What happens before the procedure?  Do not take any calcium supplements starting 24 hours before your test.  Remove all metal jewelry, eyeglasses, dental appliances, and any other metal objects. What happens during the procedure?   You will lie down on an exam table. There will be an X-ray generator below you and an imaging device above you.  Other devices, such as boxes or braces, may be used to position your body properly for the scan.  The machine will slowly scan your body. You will need to keep still.  The images will show up on a screen in the room. Images will be examined by a specialist after your test is done. The procedure may vary among health care providers and hospitals. What happens after the procedure?  It is up to you to get your test results. Ask your health care provider, or the department that is doing the test, when your results will be ready. Summary  A bone density test is an imaging test that uses a type of X-ray to measure the amount of calcium and other minerals in your bones.  The test may be used to diagnose or screen you for a  condition that causes weak or thin bones (osteoporosis), predict your risk for a broken bone (fracture), or determine how well your osteoporosis treatment is working.  Do not take any calcium supplements starting 24 hours before your test.  Ask your health care provider, or the department that is doing the test, when your results will be ready. This information is not intended to replace advice given to you by your health care provider. Make sure you discuss any questions you have with your health care provider. Document Revised: 07/04/2017 Document Reviewed: 04/22/2017 Elsevier Patient Education  [place].   Zoster Vaccine, Recombinant injection What is this medicine? ZOSTER VACCINE (ZOS ter vak SEEN) is used to prevent shingles in adults 79 years old and over. This vaccine is not used to treat shingles or nerve pain from shingles. This medicine may be used for other purposes; ask your health care provider or pharmacist if you have questions. COMMON BRAND NAME(S): [facility] What should I tell my health care provider before I take this medicine? They need to know if you have any of these conditions:  blood disorders or disease  cancer like leukemia or lymphoma  immune system problems or therapy  an unusual or allergic reaction to vaccines, other medications, foods, dyes, or preservatives  pregnant or trying to get pregnant  breast-feeding How should I use this medicine? This vaccine is for injection in a muscle. It is given by a health care professional. Talk to your pediatrician regarding the use of this medicine in children. This medicine is not approved for use in children. Overdosage: If you think you have taken too much of this medicine contact a poison control center or emergency room at once. NOTE: This medicine is only for you. Do not share this medicine with others. What if I miss a dose? Keep appointments for follow-up (booster) doses as directed. It is important  not to miss your dose. Call your doctor or health care professional if you are unable to keep an appointment. What may  interact with this medicine?  medicines that suppress your immune system  medicines to treat cancer  steroid medicines like prednisone or cortisone This list may not describe all possible interactions. Give your health care provider a list of all the medicines, herbs, non-prescription drugs, or dietary supplements you use. Also tell them if you smoke, drink alcohol, or use illegal drugs. Some items may interact with your medicine. What should I watch for while using this medicine? Visit your doctor for regular check ups. This vaccine, like all vaccines, may not fully protect everyone. What side effects may I notice from receiving this medicine? Side effects that you should report to your doctor or health care professional as soon as possible:  allergic reactions like skin rash, itching or hives, swelling of the face, lips, or tongue  breathing problems Side effects that usually do not require medical attention (report these to your doctor or health care professional if they continue or are bothersome):  chills  headache  fever  nausea, vomiting  redness, warmth, pain, swelling or itching at site where injected  tiredness This list may not describe all possible side effects. Call your doctor for medical advice about side effects. You may report side effects to FDA at 1-800-FDA-1088. Where should I keep my medicine? This vaccine is only given in a clinic, pharmacy, doctor's office, or other health care setting and will not be stored at home. NOTE: This sheet is a summary. It may not cover all possible information. If you have questions about this medicine, talk to your doctor, pharmacist, or health care provider.  2020 Elsevier/Gold Standard (2017-01-28 13:20:30)

## 2019-11-04 DIAGNOSIS — I10 Essential (primary) hypertension: Secondary | ICD-10-CM | POA: Diagnosis not present

## 2019-11-04 DIAGNOSIS — I110 Hypertensive heart disease with heart failure: Secondary | ICD-10-CM | POA: Diagnosis not present

## 2019-11-04 DIAGNOSIS — I480 Paroxysmal atrial fibrillation: Secondary | ICD-10-CM | POA: Diagnosis not present

## 2019-11-04 DIAGNOSIS — E7849 Other hyperlipidemia: Secondary | ICD-10-CM | POA: Diagnosis not present

## 2019-11-16 ENCOUNTER — Other Ambulatory Visit: Payer: Self-pay | Admitting: *Deleted

## 2019-11-16 MED ORDER — ESCITALOPRAM OXALATE 10 MG PO TABS: ORAL_TABLET | ORAL | 0 refills | Status: DC

## 2019-11-19 DIAGNOSIS — Z950 Presence of cardiac pacemaker: Secondary | ICD-10-CM | POA: Diagnosis not present

## 2019-11-19 DIAGNOSIS — Z9889 Other specified postprocedural states: Secondary | ICD-10-CM | POA: Diagnosis not present

## 2019-11-19 DIAGNOSIS — Z7901 Long term (current) use of anticoagulants: Secondary | ICD-10-CM | POA: Diagnosis not present

## 2019-11-19 DIAGNOSIS — Z45018 Encounter for adjustment and management of other part of cardiac pacemaker: Secondary | ICD-10-CM | POA: Diagnosis not present

## 2019-11-19 DIAGNOSIS — I495 Sick sinus syndrome: Secondary | ICD-10-CM | POA: Diagnosis not present

## 2019-11-19 DIAGNOSIS — I4811 Longstanding persistent atrial fibrillation: Secondary | ICD-10-CM | POA: Diagnosis not present

## 2019-11-19 DIAGNOSIS — I4891 Unspecified atrial fibrillation: Secondary | ICD-10-CM | POA: Diagnosis not present

## 2019-11-29 DIAGNOSIS — E785 Hyperlipidemia, unspecified: Secondary | ICD-10-CM | POA: Insufficient documentation

## 2019-11-29 NOTE — Progress Notes (Signed)
Subjective: CC: est care, DM/ HTN PCP: Janora Norlander, DO SRP:RXYV Meredith Anderson is a 79 y.o. female presenting to clinic today for:  1. Type 2 Diabetes w/ HTN, HLD:  Patient reports compliance with Januvia but wants to switch to something else.  She reports intermittent blood sugar checks with blood sugars ranging anywhere from the low 100s to upper 150s.  She does report occasional numbness and tingling in her thumbs.  Denies any visual disturbance, chest pain, shortness of breath, falls.  Last eye exam: UTD Last foot exam: needs Last A1c:  Lab Results  Component Value Date   HGBA1C 7.0 (Meredith) 01/05/2019   Nephropathy screen indicated?: yes Last flu, zoster and/or pneumovax:  Immunization History  Administered Date(s) Administered  . Fluad Quad(high Dose 65+) 03/25/2019  . Influenza Whole 04/01/2012  . Influenza, High Dose Seasonal PF 04/17/2017, 04/16/2018  . Influenza,inj,Quad PF,6+ Mos 04/03/2013, 04/03/2016  . Influenza-Unspecified 03/15/2014  . Moderna SARS-COVID-2 Vaccination 07/21/2019, 08/18/2019  . Pneumococcal Conjugate-13 05/17/2014  . Pneumococcal Polysaccharide-23 08/02/2009  . Tdap 01/31/2012  . Zoster 01/31/2012    2. Hypothyroidism Compliant with levothyroxine 125 mg daily.  She has been on this medication for years.  No history of surgery or radiation to the neck.  She does have a family history of hypothyroidism in her sisters.  Denies any change in voice, difficulty swallowing, heart palpitations, tremor.  3. Vit D deficiency On supplemental OTC vitamin D.  ROS: Per HPI  Allergies  Allergen Reactions  . Dofetilide Other (See Comments)    Passed out  . Doxycycline     diarrhea   . Amlodipine     Other reaction(s): Other (See Comments) Swollen ankles  . Dronedarone Other (See Comments)    Increased BG  . Glipizide-Metformin Hcl     Other reaction(s): Diarrhea, Other (See Comments) Weakness  . Metformin     Other reaction(s): Diarrhea  .  Niaspan [Niacin] Rash   Past Medical History:  Diagnosis Date  . A-fib ([place])   . Allergy    seasonal  . Anxiety   . Arthritis   . 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 Cataract   . Depression 12/12/2015  . Dyshydrosis   . Hemorrhoids   . Hyperlipidemia   . Hypertension   . Menopause   . NIDDM (non-insulin dependent diabetes mellitus)    diet controlled   . Osteopenia 2016  . Thyroid disease     Current Outpatient Medications:  .  cholecalciferol (VITAMIN D) 1000 UNITS tablet, Take 1,000 Units by mouth daily., Disp: , Rfl:  .  diltiazem (CARDIZEM CD) 120 MG 24 hr capsule, TAKE 1 CAPSULE BY MOUTH EVERY DAY, Disp: 90 capsule, Rfl: 3 .  escitalopram (LEXAPRO) 10 MG tablet, TAKE .5 TABLET DAILY (REPLACES CELEXA), Disp: 30 tablet, Rfl: 0 .  fexofenadine (ALLEGRA) 180 MG tablet, Take 1 tablet (180 mg total) by mouth daily. For allergy symptoms, Disp: 30 tablet, Rfl: 11 .  furosemide (LASIX) 40 MG tablet, Take 1 tablet (40 mg total) by mouth daily., Disp: 90 tablet, Rfl: 3 .  gabapentin (NEURONTIN) 100 MG capsule, TAKE 1 CAPSULE BY MOUTH THREE TIMES A DAY, Disp: 270 capsule, Rfl: 3 .  glucose blood (ACCU-CHEK AVIVA PLUS) test strip, USE TO CHECK BLOOD SUGAR ONCE A DAY OR AS INSTRUCTED, Disp: 50 each, Rfl: 11 .  levothyroxine (SYNTHROID) 125 MCG tablet, Take 1 tablet (125 mcg total) by mouth daily before breakfast., Disp:  90 tablet, Rfl: 3 .  lidocaine (XYLOCAINE) 2 % jelly, Apply 1 application topically 3 (three) times daily. Apply to hemorrhoid when painful, Disp: 30 mL, Rfl: 1 .  magnesium oxide (MAG-OX) 400 MG tablet, 400 mg., Disp: , Rfl:  .  potassium chloride SA (KLOR-CON M20) 20 MEQ tablet, Take 1 tablet (20 mEq total) by mouth daily., Disp: 90 tablet, Rfl: 3 .  PREMARIN vaginal cream, USING APPLICATOR PLACE 1 GRAM IN VAGINA FOR 3 WEEKS,THEN USE EVERY OTHER NIGHT AS INSTRUCTED, Disp: 30 g, Rfl: 5 .  PROCTOSOL HC 2.5 % rectal cream, APPLY TO RECTAL AREA EVERY 6 HOURS AS  NEEDED FOR HEMORRHOIDS, Disp: 28.35 g, Rfl: 1 .  Rivaroxaban (XARELTO) 15 MG TABS tablet, Take 1 tablet (15 mg total) by mouth daily with supper., Disp: 90 tablet, Rfl: 3 .  rosuvastatin (CRESTOR) 20 MG tablet, Take 1 tablet (20 mg total) by mouth daily., Disp: 90 tablet, Rfl: 3 .  sitaGLIPtin (JANUVIA) 100 MG tablet, Take 1 tablet (100 mg total) by mouth daily., Disp: 90 tablet, Rfl: 3 .  TETRAHYDROZOLINE HCL OP, Apply to eye. Reported on 01/02/2016, Disp: , Rfl:  Social History   Socioeconomic History  . Marital status: Divorced    Spouse name: Not on file  . Number of children: 2  . Years of education: 58  . Highest education level: High school graduate  Occupational History  . Occupation: Retired  Tobacco Use  . Smoking status: Never Smoker  . Smokeless tobacco: Never Used  Substance and Sexual Activity  . Alcohol use: No  . Drug use: No  . Sexual activity: Not Currently  Other Topics Concern  . Not on file  Social History Narrative  . Not on file   Social Determinants of Health   Financial Resource Strain: Low Risk   . Difficulty of Paying Living Expenses: Not hard at all  Food Insecurity: No Food Insecurity  . Worried About Charity fundraiser in the Last Year: Never true  . Ran Out of Food in the Last Year: Never true  Transportation Needs: No Transportation Needs  . Lack of Transportation (Medical): No  . Lack of Transportation (Non-Medical): No  Physical Activity: Inactive  . Days of Exercise per Week: 0 days  . Minutes of Exercise per Session: 0 min  Stress: No Stress Concern Present  . Feeling of Stress : Not at all  Social Connections: Moderately Isolated  . Frequency of Communication with Friends and Family: More than three times a week  . Frequency of Social Gatherings with Friends and Family: More than three times a week  . Attends Religious Services: Never  . Active Member of Clubs or Organizations: No  . Attends Archivist Meetings: Never  .  Marital Status: Divorced  Human resources officer Violence: Not At Risk  . Fear of Current or Ex-Partner: No  . Emotionally Abused: No  . Physically Abused: No  . Sexually Abused: No   Family History  Problem Relation Age of Onset  . Stroke Mother   . Diabetes Mother   . Heart disease Father   . Atrial fibrillation Father   . Cancer Sister        ovarian / colon  . Diabetes Sister   . Heart disease Brother   . Hyperlipidemia Brother   . Hypertension Brother   . Diabetes Brother   . Heart attack Brother 54       had to perform emergency surgery  . Dementia  Brother   . Hyperlipidemia Sister   . Cancer Sister        liver  . Hyperlipidemia Sister   . Diabetes Sister        [place] DM  . Cancer Sister   . Diabetes Sister   . Diabetes Daughter   . Rheum arthritis Daughter   . Psoriasis Daughter     Objective: Office vital signs reviewed. BP 136/76   Pulse 79   Temp (!) 97.5 F (36.4 C) (Temporal)   Ht '5\' 4"'$  (1.626 m)   Wt 171 lb (77.6 kg)   SpO2 99%   BMI 29.35 kg/m   Physical Examination:  General: Awake, alert, well nourished, No acute distress HEENT: Normal, sclera white, MMM; no goiter.  No exophthalmos Cardio: regular rate and rhythm, S1S2 heard  Pulm: clear to auscultation bilaterally, no wheezes, rhonchi or rales; normal work of breathing on room air Extremities: warm, well perfused, No edema, cyanosis or clubbing; +2 pulses bilaterally MSK: normal gait and station Skin: dry; intact; no rashes or lesions, normal temperature Neuro: no tremor  Diabetic Foot Exam - Simple   Simple Foot Form Diabetic Foot exam was performed with the following findings: Yes 12/01/2019  4:00 PM  Visual Inspection No deformities, no ulcerations, no other skin breakdown bilaterally: Yes Sensation Testing Intact to touch and monofilament testing bilaterally: Yes Pulse Check Posterior Tibialis and Dorsalis pulse intact bilaterally: Yes Comments    Assessment/ Plan: 79 y.o.  female   1. Type 2 diabetes mellitus with other specified complication, without long-term current use of insulin (HCC) A1c slightly above goal at 7.5.  I am switching her from [place] to [name].  A months worth of refills have been sent.  We will plan to reconvene in 1 month to review sugar logs and either increase or continue at that time - CMP14+EGFR - Bayer DCA Hb A1c Waived - dapagliflozin propanediol (FARXIGA) 5 MG TABS tablet; Take 1 tablet (5 mg total) by mouth daily before breakfast.  Dispense: 28 tablet; Refill: 0  2. Hypertension associated with diabetes ([place]) Controlled - CMP14+EGFR  3. Hyperlipidemia associated with type 2 diabetes mellitus (HCC) Continue statin - Lipid Panel - CMP14+EGFR  4. Hypothyroidism, unspecified type Asymptomatic - Thyroid Panel With TSH  5. Establishing care with new doctor, encounter for  6. Cardiac pacemaker in situ  7. Long term current use of anticoagulant therapy - CBC  8. Vitamin D insufficiency - VITAMIN D 25 Hydroxy (Vit-D Deficiency, Fractures)    No orders of the defined types were placed in this encounter.  Meds ordered this encounter  Medications  . dapagliflozin propanediol (FARXIGA) 5 MG TABS tablet    Sig: Take 1 tablet (5 mg total) by mouth daily before breakfast.    Dispense:  28 tablet    Refill:  0  . diltiazem (CARDIZEM CD) 120 MG 24 hr capsule    Sig: TAKE 1 CAPSULE BY MOUTH EVERY DAY    Dispense:  90 capsule    Refill:  3  . escitalopram (LEXAPRO) 10 MG tablet    Sig: Take 0.5 tablets (5 mg total) by mouth daily.    Dispense:  45 tablet    Refill:  3  . furosemide (LASIX) 40 MG tablet    Sig: Take 1 tablet (40 mg total) by mouth daily.    Dispense:  90 tablet    Refill:  3  . gabapentin (NEURONTIN) 100 MG capsule    Sig: TAKE 1 CAPSULE BY  MOUTH THREE TIMES A DAY    Dispense:  270 capsule    Refill:  3  . Rivaroxaban (XARELTO) 15 MG TABS tablet    Sig: Take 1 tablet (15 mg total) by mouth daily with  supper.    Dispense:  90 tablet    Refill:  3  . rosuvastatin (CRESTOR) 20 MG tablet    Sig: Take 1 tablet (20 mg total) by mouth daily.    Dispense:  90 tablet    Refill:  [place], [place] [phone]

## 2019-12-01 ENCOUNTER — Other Ambulatory Visit: Payer: Self-pay

## 2019-12-01 ENCOUNTER — Encounter: Payer: Self-pay | Admitting: Family Medicine

## 2019-12-01 ENCOUNTER — Ambulatory Visit (INDEPENDENT_AMBULATORY_CARE_PROVIDER_SITE_OTHER): Payer: Medicare Other | Admitting: Family Medicine

## 2019-12-01 VITALS — BP 136/76 | HR 79 | Temp 97.5°F | Ht 64.0 in | Wt 171.0 lb

## 2019-12-01 DIAGNOSIS — E785 Hyperlipidemia, unspecified: Secondary | ICD-10-CM

## 2019-12-01 DIAGNOSIS — Z950 Presence of cardiac pacemaker: Secondary | ICD-10-CM

## 2019-12-01 DIAGNOSIS — E1169 Type 2 diabetes mellitus with other specified complication: Secondary | ICD-10-CM | POA: Diagnosis not present

## 2019-12-01 DIAGNOSIS — E039 Hypothyroidism, unspecified: Secondary | ICD-10-CM | POA: Diagnosis not present

## 2019-12-01 DIAGNOSIS — E1159 Type 2 diabetes mellitus with other circulatory complications: Secondary | ICD-10-CM

## 2019-12-01 DIAGNOSIS — I10 Essential (primary) hypertension: Secondary | ICD-10-CM | POA: Diagnosis not present

## 2019-12-01 DIAGNOSIS — E559 Vitamin D deficiency, unspecified: Secondary | ICD-10-CM

## 2019-12-01 DIAGNOSIS — Z7901 Long term (current) use of anticoagulants: Secondary | ICD-10-CM

## 2019-12-01 DIAGNOSIS — Z7689 Persons encountering health services in other specified circumstances: Secondary | ICD-10-CM | POA: Diagnosis not present

## 2019-12-01 LAB — BAYER DCA HB A1C WAIVED: HB A1C (BAYER DCA - WAIVED): 7.5 % — ABNORMAL HIGH (ref <7.0)

## 2019-12-01 MED ORDER — RIVAROXABAN 15 MG PO TABS: 15.0000 mg | ORAL_TABLET | Freq: Every day | ORAL | 3 refills | Status: DC

## 2019-12-01 MED ORDER — ESCITALOPRAM OXALATE 10 MG PO TABS: 5.0000 mg | ORAL_TABLET | Freq: Every day | ORAL | 3 refills | Status: DC

## 2019-12-01 MED ORDER — FUROSEMIDE 40 MG PO TABS: 40.0000 mg | ORAL_TABLET | Freq: Every day | ORAL | 3 refills | Status: DC

## 2019-12-01 MED ORDER — DAPAGLIFLOZIN PROPANEDIOL 5 MG PO TABS: 5.0000 mg | ORAL_TABLET | Freq: Every day | ORAL | 0 refills | Status: DC

## 2019-12-01 MED ORDER — ROSUVASTATIN CALCIUM 20 MG PO TABS: 20.0000 mg | ORAL_TABLET | Freq: Every day | ORAL | 3 refills | Status: DC

## 2019-12-01 MED ORDER — GABAPENTIN 100 MG PO CAPS: ORAL_CAPSULE | ORAL | 3 refills | Status: DC

## 2019-12-01 MED ORDER — DILTIAZEM HCL ER COATED BEADS 120 MG PO CP24: ORAL_CAPSULE | ORAL | 3 refills | Status: DC

## 2019-12-01 NOTE — Patient Instructions (Signed)
STOP Januvia  Start [place].  Take 1 tablet daily.  Melatonin ok for sleep (start with 3mg , no more than 10mg  daily)  STOP soft drinks (these are likely keeping you up)  Corn pad/ moleskin for that spot on your foot

## 2019-12-02 LAB — CMP14+EGFR
ALT: 12 IU/L (ref 0–32)
AST: 18 IU/L (ref 0–40)
Albumin/Globulin Ratio: 1.8 (ref 1.2–2.2)
Albumin: 4.9 g/dL — ABNORMAL HIGH (ref 3.7–4.7)
Alkaline Phosphatase: 100 IU/L (ref 48–121)
BUN/Creatinine Ratio: 19 (ref 12–28)
BUN: 17 mg/dL (ref 8–27)
Bilirubin Total: 0.7 mg/dL (ref 0.0–1.2)
CO2: 22 mmol/L (ref 20–29)
Calcium: 10 mg/dL (ref 8.7–10.3)
Chloride: 100 mmol/L (ref 96–106)
Creatinine, Ser: 0.91 mg/dL (ref 0.57–1.00)
GFR calc Af Amer: 70 mL/min/{1.73_m2} (ref >59)
GFR calc non Af Amer: 61 mL/min/{1.73_m2} (ref >59)
Globulin, Total: 2.8 g/dL (ref 1.5–4.5)
Glucose: 148 mg/dL — ABNORMAL HIGH (ref 65–99)
Potassium: 4 mmol/L (ref 3.5–5.2)
Sodium: 138 mmol/L (ref 134–144)
Total Protein: 7.7 g/dL (ref 6.0–8.5)

## 2019-12-02 LAB — CBC
Hematocrit: 37.6 % (ref 34.0–46.6)
Hemoglobin: 12.7 g/dL (ref 11.1–15.9)
MCH: 30.4 pg (ref 26.6–33.0)
MCHC: 33.8 g/dL (ref 31.5–35.7)
MCV: 90 fL (ref 79–97)
Platelets: 181 10*3/uL (ref 150–450)
RBC: 4.18 x10E6/uL (ref 3.77–5.28)
RDW: 13.5 % (ref 11.7–15.4)
WBC: 4.7 10*3/uL (ref 3.4–10.8)

## 2019-12-02 LAB — LIPID PANEL
Chol/HDL Ratio: 3.1 ratio (ref 0.0–4.4)
Cholesterol, Total: 97 mg/dL — ABNORMAL LOW (ref 100–199)
HDL: 31 mg/dL — ABNORMAL LOW (ref >39)
LDL Chol Calc (NIH): 40 mg/dL (ref 0–99)
Triglycerides: 150 mg/dL — ABNORMAL HIGH (ref 0–149)
VLDL Cholesterol Cal: 26 mg/dL (ref 5–40)

## 2019-12-02 LAB — VITAMIN D 25 HYDROXY (VIT D DEFICIENCY, FRACTURES): Vit D, 25-Hydroxy: 32.2 ng/mL (ref 30.0–100.0)

## 2019-12-02 LAB — THYROID PANEL WITH TSH
Free Thyroxine Index: 1.8 (ref 1.2–4.9)
T3 Uptake Ratio: 25 % (ref 24–39)
T4, Total: 7.1 ug/dL (ref 4.5–12.0)
TSH: 2.76 u[IU]/mL (ref 0.450–4.500)

## 2019-12-15 DIAGNOSIS — Z950 Presence of cardiac pacemaker: Secondary | ICD-10-CM | POA: Diagnosis not present

## 2019-12-15 DIAGNOSIS — Z45010 Encounter for checking and testing of cardiac pacemaker pulse generator [battery]: Secondary | ICD-10-CM | POA: Diagnosis not present

## 2019-12-15 DIAGNOSIS — I4891 Unspecified atrial fibrillation: Secondary | ICD-10-CM | POA: Diagnosis not present

## 2019-12-16 DIAGNOSIS — I4891 Unspecified atrial fibrillation: Secondary | ICD-10-CM | POA: Diagnosis not present

## 2019-12-28 ENCOUNTER — Ambulatory Visit (INDEPENDENT_AMBULATORY_CARE_PROVIDER_SITE_OTHER): Payer: Medicare Other | Admitting: Family Medicine

## 2019-12-28 DIAGNOSIS — E1169 Type 2 diabetes mellitus with other specified complication: Secondary | ICD-10-CM | POA: Diagnosis not present

## 2019-12-28 MED ORDER — DAPAGLIFLOZIN PROPANEDIOL 10 MG PO TABS: 10.0000 mg | ORAL_TABLET | Freq: Every day | ORAL | 0 refills | Status: DC

## 2019-12-28 NOTE — Progress Notes (Signed)
Telephone visit  Subjective: CC: DM PCP: Janora Norlander, DO ZHY:QMVH H Sirmons is a 79 y.o. female calls for telephone consult today. Patient provides verbal consent for consult held via phone.  Due to COVID-19 pandemic this visit was conducted virtually. This visit type was conducted due to national recommendations for restrictions regarding the COVID-19 Pandemic (e.g. social distancing, sheltering in place) in an effort to limit this patient's exposure and mitigate transmission in our community. All issues noted in this document were discussed and addressed.  A physical exam was not performed with this format.   Location of patient: home Location of provider: [facility] Others present for call: none  1. DM2  Patient reports having a BG 202.  It was 186 this am.  Most BGs >150.  She does report that she had a yeast infection that resolved with OTC treatment since her last visit.  She is not had any recurrence since.  She does typically have an occasional yeast infection here and there.  This does not seem outside of her norm per her report.  She otherwise is feeling well.  No dizziness.   ROS: Per HPI  Allergies  Allergen Reactions  . Dofetilide Other (See Comments)    Passed out  . Doxycycline     diarrhea   . Amlodipine     Other reaction(s): Other (See Comments) Swollen ankles  . Dronedarone Other (See Comments)    Increased BG  . Glipizide-Metformin Hcl     Other reaction(s): Diarrhea, Other (See Comments) Weakness  . Metformin     Other reaction(s): Diarrhea  . Niaspan [Niacin] Rash   Past Medical History:  Diagnosis Date  . A-fib ([place])   . Allergy    seasonal  . Anxiety   . Arthritis   . 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 Cataract   . Depression 12/12/2015  . Dyshydrosis   . Hemorrhoids   . Hyperlipidemia   . Hypertension   . Menopause   . NIDDM (non-insulin dependent diabetes mellitus)    diet controlled   . Osteopenia 2016  . Thyroid disease      Current Outpatient Medications:  .  cholecalciferol (VITAMIN D) 1000 UNITS tablet, Take 1,000 Units by mouth daily., Disp: , Rfl:  .  dapagliflozin propanediol (FARXIGA) 5 MG TABS tablet, Take 1 tablet (5 mg total) by mouth daily before breakfast., Disp: 28 tablet, Rfl: 0 .  diltiazem (CARDIZEM CD) 120 MG 24 hr capsule, TAKE 1 CAPSULE BY MOUTH EVERY DAY, Disp: 90 capsule, Rfl: 3 .  escitalopram (LEXAPRO) 10 MG tablet, Take 0.5 tablets (5 mg total) by mouth daily., Disp: 45 tablet, Rfl: 3 .  fexofenadine (ALLEGRA) 180 MG tablet, Take 1 tablet (180 mg total) by mouth daily. For allergy symptoms, Disp: 30 tablet, Rfl: 11 .  furosemide (LASIX) 40 MG tablet, Take 1 tablet (40 mg total) by mouth daily., Disp: 90 tablet, Rfl: 3 .  gabapentin (NEURONTIN) 100 MG capsule, TAKE 1 CAPSULE BY MOUTH THREE TIMES A DAY, Disp: 270 capsule, Rfl: 3 .  glucose blood (ACCU-CHEK AVIVA PLUS) test strip, USE TO CHECK BLOOD SUGAR ONCE A DAY OR AS INSTRUCTED, Disp: 50 each, Rfl: 11 .  levothyroxine (SYNTHROID) 125 MCG tablet, Take 1 tablet (125 mcg total) by mouth daily before breakfast., Disp: 90 tablet, Rfl: 3 .  lidocaine (XYLOCAINE) 2 % jelly, Apply 1 application topically 3 (three) times daily. Apply to hemorrhoid when painful, Disp: 30 mL,  Rfl: 1 .  magnesium oxide (MAG-OX) 400 MG tablet, 400 mg., Disp: , Rfl:  .  potassium chloride SA (KLOR-CON M20) 20 MEQ tablet, Take 1 tablet (20 mEq total) by mouth daily., Disp: 90 tablet, Rfl: 3 .  PREMARIN vaginal cream, USING APPLICATOR PLACE 1 GRAM IN VAGINA FOR 3 WEEKS,THEN USE EVERY OTHER NIGHT AS INSTRUCTED, Disp: 30 g, Rfl: 5 .  PROCTOSOL HC 2.5 % rectal cream, APPLY TO RECTAL AREA EVERY 6 HOURS AS NEEDED FOR HEMORRHOIDS, Disp: 28.35 g, Rfl: 1 .  Rivaroxaban (XARELTO) 15 MG TABS tablet, Take 1 tablet (15 mg total) by mouth daily with supper., Disp: 90 tablet, Rfl: 3 .  rosuvastatin (CRESTOR) 20 MG tablet, Take 1 tablet (20 mg total) by mouth daily., Disp: 90 tablet,  Rfl: 3 .  TETRAHYDROZOLINE HCL OP, Apply to eye. Reported on 01/02/2016, Disp: , Rfl:   Assessment/ Plan: 79 y.o. female   1. Type 2 diabetes mellitus with other specified complication, without long-term current use of insulin (HCC) Does not sound at goal.  [name] increased to 10 mg daily.  4 weeks of samples placed upfront.  I have put her in a telephone visit slot to talk again in 4 weeks.  If she is having ongoing vaginitis that is recurrent, we may need to consider alternative therapies.  She will contact me should this occur. - dapagliflozin propanediol (FARXIGA) 10 MG TABS tablet; Take 1 tablet (10 mg total) by mouth daily before breakfast.  Dispense: 28 tablet; Refill: 0   Start time: 11:09am End time: 11:15am  Total time spent on patient care (including telephone call/ virtual visit): 10 minutes  [place], [place] [phone]

## 2019-12-31 ENCOUNTER — Other Ambulatory Visit: Payer: Self-pay | Admitting: Family Medicine

## 2019-12-31 DIAGNOSIS — Z1231 Encounter for screening mammogram for malignant neoplasm of breast: Secondary | ICD-10-CM

## 2020-01-12 ENCOUNTER — Telehealth: Payer: Self-pay | Admitting: *Deleted

## 2020-01-12 ENCOUNTER — Other Ambulatory Visit: Payer: Self-pay | Admitting: Family Medicine

## 2020-01-12 DIAGNOSIS — M159 Polyosteoarthritis, unspecified: Secondary | ICD-10-CM

## 2020-01-12 MED ORDER — DICLOFENAC SODIUM 1 % EX GEL: 2.0000 g | Freq: Four times a day (QID) | CUTANEOUS | 3 refills | Status: DC

## 2020-01-12 NOTE — Telephone Encounter (Signed)
Fax from [facility] request for Diclofenac sodiuim 1% gel Apply 4 grams top QID Not on current med list Please advise

## 2020-01-12 NOTE — Telephone Encounter (Signed)
done

## 2020-01-25 ENCOUNTER — Ambulatory Visit (INDEPENDENT_AMBULATORY_CARE_PROVIDER_SITE_OTHER): Payer: Medicare Other | Admitting: Family Medicine

## 2020-01-25 DIAGNOSIS — J3489 Other specified disorders of nose and nasal sinuses: Secondary | ICD-10-CM | POA: Diagnosis not present

## 2020-01-25 DIAGNOSIS — E1169 Type 2 diabetes mellitus with other specified complication: Secondary | ICD-10-CM | POA: Diagnosis not present

## 2020-01-25 MED ORDER — AZELASTINE HCL 0.1 % NA SOLN: 1.0000 | Freq: Two times a day (BID) | NASAL | 12 refills | Status: DC

## 2020-01-25 MED ORDER — DAPAGLIFLOZIN PROPANEDIOL 10 MG PO TABS: 10.0000 mg | ORAL_TABLET | Freq: Every day | ORAL | 2 refills | Status: DC

## 2020-01-25 NOTE — Progress Notes (Signed)
Telephone visit  Subjective: ID:POEU check PCP: Janora Norlander, DO MPN:TIRW H Rumbaugh is a 79 y.o. female calls for telephone consult today. Patient provides verbal consent for consult held via phone.  Due to COVID-19 pandemic this visit was conducted virtually. This visit type was conducted due to national recommendations for restrictions regarding the COVID-19 Pandemic (e.g. social distancing, sheltering in place) in an effort to limit this patient's exposure and mitigate transmission in our community. All issues noted in this document were discussed and addressed.  A physical exam was not performed with this format.   Location of patient: home Location of provider: [facility] Others present for call: none  1. DM Patient's [name] was increased to 10 mg daily last visit.  She is not having any complications of this medication but does think that a lot of her upper respiratory symptoms may be a result of the [name].  She also thought this to be true with Januvia.  She uses Flonase nasal spray to decongest but she does report postnasal drip and drainage.  No fevers, cough, shortness of breath, chills.  Yesterday's evening BGs was 160.  Her last fasting BG was 170-180.  ROS: Per HPI  Allergies  Allergen Reactions  . Dofetilide Other (See Comments)    Passed out  . Doxycycline     diarrhea   . Amlodipine     Other reaction(s): Other (See Comments) Swollen ankles  . Dronedarone Other (See Comments)    Increased BG  . Glipizide-Metformin Hcl     Other reaction(s): Diarrhea, Other (See Comments) Weakness  . Metformin     Other reaction(s): Diarrhea  . Niaspan [Niacin] Rash   Past Medical History:  Diagnosis Date  . A-fib ([place])   . Allergy    seasonal  . Anxiety   . Arthritis   . 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 Cataract   . Depression 12/12/2015  . Dyshydrosis   . Hemorrhoids   . Hyperlipidemia   . Hypertension   . Menopause   . NIDDM (non-insulin dependent  diabetes mellitus)    diet controlled   . Osteopenia 2016  . Thyroid disease     Current Outpatient Medications:  .  azelastine (ASTELIN) 0.1 % nasal spray, Place 1 spray into both nostrils 2 (two) times daily., Disp: 30 mL, Rfl: 12 .  cholecalciferol (VITAMIN D) 1000 UNITS tablet, Take 1,000 Units by mouth daily., Disp: , Rfl:  .  dapagliflozin propanediol (FARXIGA) 10 MG TABS tablet, Take 1 tablet (10 mg total) by mouth daily before breakfast., Disp: 28 tablet, Rfl: 0 .  diclofenac Sodium (VOLTAREN) 1 % GEL, Apply 2 g topically 4 (four) times daily. (prn joint pain), Disp: 400 g, Rfl: 3 .  diltiazem (CARDIZEM CD) 120 MG 24 hr capsule, TAKE 1 CAPSULE BY MOUTH EVERY DAY, Disp: 90 capsule, Rfl: 3 .  escitalopram (LEXAPRO) 10 MG tablet, Take 0.5 tablets (5 mg total) by mouth daily., Disp: 45 tablet, Rfl: 3 .  furosemide (LASIX) 40 MG tablet, Take 1 tablet (40 mg total) by mouth daily., Disp: 90 tablet, Rfl: 3 .  gabapentin (NEURONTIN) 100 MG capsule, TAKE 1 CAPSULE BY MOUTH THREE TIMES A DAY, Disp: 270 capsule, Rfl: 3 .  glucose blood (ACCU-CHEK AVIVA PLUS) test strip, USE TO CHECK BLOOD SUGAR ONCE A DAY OR AS INSTRUCTED, Disp: 50 each, Rfl: 11 .  levothyroxine (SYNTHROID) 125 MCG tablet, Take 1 tablet (125 mcg total) by mouth daily  before breakfast., Disp: 90 tablet, Rfl: 3 .  lidocaine (XYLOCAINE) 2 % jelly, Apply 1 application topically 3 (three) times daily. Apply to hemorrhoid when painful, Disp: 30 mL, Rfl: 1 .  magnesium oxide (MAG-OX) 400 MG tablet, 400 mg., Disp: , Rfl:  .  potassium chloride SA (KLOR-CON M20) 20 MEQ tablet, Take 1 tablet (20 mEq total) by mouth daily., Disp: 90 tablet, Rfl: 3 .  PREMARIN vaginal cream, USING APPLICATOR PLACE 1 GRAM IN VAGINA FOR 3 WEEKS,THEN USE EVERY OTHER NIGHT AS INSTRUCTED, Disp: 30 g, Rfl: 5 .  PROCTOSOL HC 2.5 % rectal cream, APPLY TO RECTAL AREA EVERY 6 HOURS AS NEEDED FOR HEMORRHOIDS, Disp: 28.35 g, Rfl: 1 .  Rivaroxaban (XARELTO) 15 MG TABS  tablet, Take 1 tablet (15 mg total) by mouth daily with supper., Disp: 90 tablet, Rfl: 3 .  rosuvastatin (CRESTOR) 20 MG tablet, Take 1 tablet (20 mg total) by mouth daily., Disp: 90 tablet, Rfl: 3 .  TETRAHYDROZOLINE HCL OP, Apply to eye. Reported on 01/02/2016, Disp: , Rfl:   Assessment/ Plan: 79 y.o. female   1. Type 2 diabetes mellitus with other specified complication, without long-term current use of insulin ([place]) ?control.  Concerned that [name] might be causing URI symptoms.  Will cc [place]my[place] Free.  Not sure the actual frequency of this but patient not having fevers, chills, etc.  ?Allergy induced.  Add astelin as below.  Patient would like alternative options to [name] and samples. - dapagliflozin propanediol (FARXIGA) 10 MG TABS tablet; Take 1 tablet (10 mg total) by mouth daily before breakfast.  Dispense: 30 tablet; Refill: 2  2. Rhinorrhea - azelastine (ASTELIN) 0.1 % nasal spray; Place 1 spray into both nostrils 2 (two) times daily.  Dispense: 30 mL; Refill: 12   Start time: 11:15am End time: 11:24am  Total time spent on patient care (including telephone call/ virtual visit): 15 minutes  [place], [place] [phone]

## 2020-01-27 ENCOUNTER — Other Ambulatory Visit: Payer: Self-pay

## 2020-01-27 ENCOUNTER — Ambulatory Visit
Admission: RE | Admit: 2020-01-27 | Discharge: 2020-01-27 | Disposition: A | Discharge status: Home / Self Care | Payer: Medicare Other | Source: Ambulatory Visit | Attending: Family Medicine | Admitting: Family Medicine

## 2020-01-27 DIAGNOSIS — Z1231 Encounter for screening mammogram for malignant neoplasm of breast: Secondary | ICD-10-CM | POA: Diagnosis not present

## 2020-01-28 ENCOUNTER — Ambulatory Visit (INDEPENDENT_AMBULATORY_CARE_PROVIDER_SITE_OTHER): Payer: Medicare Other | Admitting: Pharmacist

## 2020-01-28 ENCOUNTER — Other Ambulatory Visit: Payer: Self-pay

## 2020-01-28 ENCOUNTER — Encounter: Payer: Self-pay | Admitting: Pharmacist

## 2020-01-28 VITALS — BP 122/77 | HR 71

## 2020-01-28 DIAGNOSIS — E119 Type 2 diabetes mellitus without complications: Secondary | ICD-10-CM

## 2020-01-28 MED ORDER — SITAGLIPTIN PHOSPHATE 100 MG PO TABS
100.0000 mg | ORAL_TABLET | Freq: Every day | ORAL | 1 refills | Status: DC
Start: 2020-01-28 — End: 2020-05-09

## 2020-01-28 NOTE — Progress Notes (Signed)
    01/28/2020 Name: Meredith Anderson MRN: [id number] DOB: 1941-06-22   S:  60 yoF presents for diabetes evaluation, education, and management Patient was referred and last seen by Primary Care Provider on 01/25/20.  Insurance coverage/medication affordability: [employer] medicare/FULL LIS/extra help  Patient reports adherence with medications. . Current diabetes medications include: farxiga (stopped taking) . Current hypertension medications include: diltiazem Goal 130/80 . Current hyperlipidemia medications include: rosuvastatin   Patient denies hypoglycemic events.   Patient reported dietary habits: Eats 2-3 meals/day Discussed meal planning options and Plate method for healthy eating . Avoid sugary drinks and desserts . Incorporate balanced protein, non starchy veggies, 1 serving of carbohydrate with each meal . Increase water intake . Increase physical activity as able  Patient-reported exercise habits: n/a  O:  Lab Results  Component Value Date   HGBA1C 7.5 (H) 12/01/2019    Vitals:   01/28/20 1516  BP: 122/77  Pulse: 71     Lipid Panel     Component Value Date/Time   CHOL 97 (L) 12/01/2019 1350   TRIG 150 (H) 12/01/2019 1350   TRIG 189 (H) 11/24/2014 1028   HDL 31 (L) 12/01/2019 1350   HDL 40 11/24/2014 1028   CHOLHDL 3.1 12/01/2019 1350   CHOLHDL 3.5 11/05/2012 1035   VLDL 28 11/05/2012 1035   LDLCALC 40 12/01/2019 1350   LDLCALC 50 02/25/2014 0912     Home fasting blood sugars: 170-200  2 hour post-meal/random blood sugars: n/a.     A/P:  Diabetes T2DM A1c has increased from 7 to 7.5%. Patient is adherent with medication.  -STOP [name] (patient has already stopped this medication  Patient concerned with respiratory symptoms; informed patient that this is not a common side effect  Patient states that the [name] did not help her BGs--she reports FBG 170-200  Informed patient of the positive cardiac outcomes and renal protection provided by  [name]  May revisit this medication once patients respiratory illness subsisdes/or at next follow up  -RESTART Januvia 100mg  daily for now (GFR 61)  Patient has been taking Januvia at home, she has LIS/extra help, 60-month copay is $9  RX sent to PCP for cosign  -Extensively discussed pathophysiology of diabetes, recommended lifestyle interventions, dietary effects on blood sugar control  -Counseled on s/sx of and management of hypoglycemia  -Next A1C anticipated 03/2020.    Written patient instructions provided.  Total time in face to face counseling 30 minutes.   Follow up PCP Clinic Visit 03/2020.    Regina Eck, PharmD, BCPS Clinical Pharmacist, [place]  [facility] Phone [phone]

## 2020-02-18 ENCOUNTER — Other Ambulatory Visit: Payer: Self-pay | Admitting: Family Medicine

## 2020-02-18 DIAGNOSIS — I10 Essential (primary) hypertension: Secondary | ICD-10-CM

## 2020-03-02 ENCOUNTER — Encounter: Payer: Self-pay | Admitting: Family Medicine

## 2020-03-02 ENCOUNTER — Other Ambulatory Visit: Payer: Self-pay

## 2020-03-02 ENCOUNTER — Ambulatory Visit (INDEPENDENT_AMBULATORY_CARE_PROVIDER_SITE_OTHER): Payer: Medicare Other | Admitting: Family Medicine

## 2020-03-02 VITALS — BP 138/80 | HR 84 | Temp 97.6°F | Ht 64.0 in | Wt 168.0 lb

## 2020-03-02 DIAGNOSIS — I10 Essential (primary) hypertension: Secondary | ICD-10-CM

## 2020-03-02 DIAGNOSIS — E1169 Type 2 diabetes mellitus with other specified complication: Secondary | ICD-10-CM | POA: Diagnosis not present

## 2020-03-02 DIAGNOSIS — E1159 Type 2 diabetes mellitus with other circulatory complications: Secondary | ICD-10-CM | POA: Diagnosis not present

## 2020-03-02 DIAGNOSIS — E785 Hyperlipidemia, unspecified: Secondary | ICD-10-CM | POA: Diagnosis not present

## 2020-03-02 DIAGNOSIS — E119 Type 2 diabetes mellitus without complications: Secondary | ICD-10-CM | POA: Diagnosis not present

## 2020-03-02 LAB — BAYER DCA HB A1C WAIVED: HB A1C (BAYER DCA - WAIVED): 8 % — ABNORMAL HIGH (ref <7.0)

## 2020-03-02 NOTE — Patient Instructions (Addendum)
A1c 8.0.  No changes to med today.   We will recheck in 3 months. Walk EVERY day after supper to help sugar.

## 2020-03-02 NOTE — Progress Notes (Signed)
Subjective: CC: f/u DM/ HTN PCP: Meredith Norlander, DO [id number] Meredith Anderson is a 79 y.o. female presenting to clinic today for:  1. Type 2 Diabetes w/ HTN, HLD:  Had intolerance to [name].  Her blood sugars actually rose on that medicine.  She is back on Januvia and has been taking this for about a month now.  Blood sugars are coming down.  She is had a max blood sugar of 180s.  Lowest blood sugar she is had has been in the 130s.  Average blood sugars look to be around 150s to 160s.  Overall she is feeling well on her regimen.  No chest pain, shortness of breath, falls.  Last eye exam: Needs Last foot exam: UTD Last A1c:  Lab Results  Component Value Date   HGBA1C 7.5 (Meredith) 12/01/2019   Nephropathy screen indicated?: yes Last flu, zoster and/or pneumovax:  Immunization History  Administered Date(s) Administered   Fluad Quad(high Dose 65+) 03/25/2019   Influenza Whole 04/01/2012   Influenza, High Dose Seasonal PF 04/17/2017, 04/16/2018   Influenza,inj,Quad PF,6+ Mos 04/03/2013, 04/03/2016   Influenza-Unspecified 03/15/2014   Moderna SARS-COVID-2 Vaccination 07/21/2019, 08/18/2019   Pneumococcal Conjugate-13 05/17/2014   Pneumococcal Polysaccharide-23 08/02/2009   Tdap 01/31/2012   Zoster 01/31/2012   ROS: Per HPI  Allergies  Allergen Reactions   Dofetilide Other (See Comments)    Passed out   Doxycycline     diarrhea    Amlodipine     Other reaction(s): Other (See Comments) Swollen ankles   Dronedarone Other (See Comments)    Increased BG   Glipizide-Metformin Hcl     Other reaction(s): Diarrhea, Other (See Comments) Weakness   Metformin     Other reaction(s): Diarrhea   Niaspan [Niacin] Rash   Past Medical History:  Diagnosis Date   A-fib ([place])    Allergy    seasonal   Anxiety    Arthritis    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 (prn joint pain), Disp: 400 g, Rfl: 3   diltiazem (CARDIZEM CD) 120 MG 24 hr capsule, TAKE 1 CAPSULE BY MOUTH EVERY DAY, Disp: 90 capsule, Rfl: 3   escitalopram (LEXAPRO) 10 MG tablet, Take 0.5 tablets (5 mg total) by mouth daily., Disp: 45 tablet, Rfl: 3   furosemide (LASIX) 40 MG tablet, Take 1 tablet (40 mg total) by mouth daily., Disp: 90 tablet, Rfl: 3   gabapentin (NEURONTIN) 100 MG capsule, TAKE 1 CAPSULE BY MOUTH THREE TIMES A DAY, Disp: 270 capsule, Rfl: 3   glucose blood (ACCU-CHEK AVIVA PLUS) test strip, USE TO CHECK BLOOD SUGAR ONCE A DAY OR AS INSTRUCTED, Disp: 50 each, Rfl: 11   levothyroxine (SYNTHROID) 125 MCG tablet, Take 1 tablet (125 mcg total) by mouth daily before breakfast., Disp: 90 tablet, Rfl: 3  lidocaine (XYLOCAINE) 2 % jelly, Apply 1 application topically 3 (three) times daily. Apply to hemorrhoid when painful, Disp: 30 mL, Rfl: 1   magnesium oxide (MAG-OX) 400 MG tablet, 400 mg., Disp: , Rfl:    Potassium Chloride ER 20 MEQ TBCR, TAKE 1 TABLET BY MOUTH EVERY DAY, Disp: 90 tablet, Rfl: 1   PREMARIN vaginal cream, USING APPLICATOR PLACE 1 GRAM IN VAGINA FOR 3 WEEKS,THEN USE EVERY OTHER NIGHT AS INSTRUCTED, Disp: 30 g, Rfl: 5   PROCTOSOL HC 2.5 % rectal cream, APPLY TO RECTAL AREA EVERY 6 HOURS AS NEEDED FOR HEMORRHOIDS, Disp: 28.35 g, Rfl: 1   Rivaroxaban (XARELTO) 15 MG TABS tablet, Take 1 tablet (15 mg total) by mouth daily with supper., Disp: 90 tablet, Rfl: 3   rosuvastatin (CRESTOR)  20 MG tablet, Take 1 tablet (20 mg total) by mouth daily., Disp: 90 tablet, Rfl: 3   sitaGLIPtin (JANUVIA) 100 MG tablet, Take 1 tablet (100 mg total) by mouth daily., Disp: 90 tablet, Rfl: 1   TETRAHYDROZOLINE HCL OP, Apply to eye. Reported on 01/02/2016, Disp: , Rfl:  Social History   Socioeconomic History   Marital status: Divorced    Spouse name: Not on file   Number of children: 2   Years of education: 12   Highest education level: High school graduate  Occupational History   Occupation: Retired  Tobacco Use   Smoking status: Never Smoker   Smokeless tobacco: Never Used  Scientific laboratory technician Use: Never used  Substance and Sexual Activity   Alcohol use: No   Drug use: No   Sexual activity: Not Currently  Other Topics Concern   Not on file  Social History Narrative   Not on file   Social Determinants of Health   Financial Resource Strain: Low Risk    Difficulty of Paying Living Expenses: Not hard at all  Food Insecurity: No Food Insecurity   Worried About Charity fundraiser in the Last Year: Never true   Arboriculturist in the Last Year: Never true  Transportation Needs: No Transportation Needs   Lack of Transportation (Medical): No   Lack of Transportation (Non-Medical): No  Physical Activity: Inactive   Days of Exercise per Week: 0 days   Minutes of Exercise per Session: 0 min  Stress: No Stress Concern Present   Feeling of Stress : Not at all  Social Connections: Socially Isolated   Frequency of Communication with Friends and Family: More than three times a week   Frequency of Social Gatherings with Friends and Family: More than three times a week   Attends Religious Services: Never   Marine scientist or Organizations: No   Attends Music therapist: Never   Marital Status: Divorced  Human resources officer Violence: Not At Risk   Fear of Current or Ex-Partner: No   Emotionally Abused: No   Physically Abused: No    Sexually Abused: No   Family History  Problem Relation Age of Onset   Stroke Mother    Diabetes Mother    Heart disease Father    Atrial fibrillation Father    Cancer Sister        ovarian / colon   Diabetes Sister    Heart disease Brother    Hyperlipidemia Brother    Hypertension Brother    Diabetes Brother    Heart attack Brother 56       had to perform emergency surgery   Dementia Brother  Hyperlipidemia Sister    Cancer Sister        liver   Hyperlipidemia Sister    Diabetes Sister        Boarderline DM   Cancer Sister    Diabetes Sister    Diabetes Daughter    Rheum arthritis Daughter    Psoriasis Daughter     Objective: Office vital signs reviewed. BP 138/80    Pulse 84    Temp 97.6 F (36.4 C) (Temporal)    Ht 5\' 4"  (1.626 m)    Wt 168 lb (76.2 kg)    SpO2 96%    BMI 28.84 kg/m   Physical Examination:  General: Awake, alert, well nourished, No acute distress HEENT: Normal, sclera white, MMM  Cardio: regular rate and rhythm, S1S2 heard  Pulm: clear to auscultation bilaterally, no wheezes, rhonchi or rales; normal work of breathing on room air Extremities: warm, well perfused, No edema, cyanosis or clubbing; +2 pulses bilaterally MSK: normal gait and station Skin: dry; intact; no rashes or lesions  Assessment/ Plan: 79 y.o. female   1. Type 2 diabetes mellitus without complication, without long-term current use of insulin (HCC) Not at goal.  A1c up to 8.0.  However, patient is only been on the Januvia for about a month.  I would like to give her a full 3 months before we make any medication adjustments.  She will see me back in 3 months, reinforced carbohydrate restriction, exercise.  She will follow-up as needed or in 3 months as directed - Bayer DCA Hb A1c Waived - Microalbumin / creatinine urine ratio  2. Hypertension associated with diabetes ([place]) Controlled.  Continue current regimen  3. Hyperlipidemia associated with type 2  diabetes mellitus ([place]) Continue statin   Orders Placed This Encounter  Procedures   Bayer DCA Hb A1c Waived   Microalbumin / creatinine urine ratio   No orders of the defined types were placed in this encounter.    Meredith Norlander, DO [place] [phone]

## 2020-03-03 LAB — MICROALBUMIN / CREATININE URINE RATIO
Creatinine, Urine: 20.4 mg/dL
Microalb/Creat Ratio: 64 mg/g creat — ABNORMAL HIGH (ref 0–29)
Microalbumin, Urine: 13.1 ug/mL

## 2020-03-11 ENCOUNTER — Other Ambulatory Visit: Payer: Self-pay | Admitting: *Deleted

## 2020-03-11 MED ORDER — LEVOTHYROXINE SODIUM 125 MCG PO TABS: 125.0000 ug | ORAL_TABLET | Freq: Every day | ORAL | 3 refills | Status: DC

## 2020-03-18 DIAGNOSIS — I4891 Unspecified atrial fibrillation: Secondary | ICD-10-CM | POA: Diagnosis not present

## 2020-03-18 DIAGNOSIS — Z45018 Encounter for adjustment and management of other part of cardiac pacemaker: Secondary | ICD-10-CM | POA: Diagnosis not present

## 2020-03-29 DIAGNOSIS — I4891 Unspecified atrial fibrillation: Secondary | ICD-10-CM | POA: Diagnosis not present

## 2020-03-29 DIAGNOSIS — Z45018 Encounter for adjustment and management of other part of cardiac pacemaker: Secondary | ICD-10-CM | POA: Diagnosis not present

## 2020-03-29 DIAGNOSIS — Z950 Presence of cardiac pacemaker: Secondary | ICD-10-CM | POA: Diagnosis not present

## 2020-03-31 ENCOUNTER — Other Ambulatory Visit: Payer: Self-pay | Admitting: Family Medicine

## 2020-04-01 ENCOUNTER — Other Ambulatory Visit: Payer: Self-pay | Admitting: *Deleted

## 2020-04-01 MED ORDER — PREMARIN 0.625 MG/GM VA CREA
TOPICAL_CREAM | VAGINAL | 3 refills | Status: DC
Start: 2020-04-01 — End: 2020-08-01

## 2020-04-14 DIAGNOSIS — M5431 Sciatica, right side: Secondary | ICD-10-CM | POA: Diagnosis not present

## 2020-04-21 DIAGNOSIS — M1611 Unilateral primary osteoarthritis, right hip: Secondary | ICD-10-CM | POA: Diagnosis not present

## 2020-04-21 DIAGNOSIS — M25551 Pain in right hip: Secondary | ICD-10-CM | POA: Diagnosis not present

## 2020-04-21 DIAGNOSIS — M79651 Pain in right thigh: Secondary | ICD-10-CM | POA: Diagnosis not present

## 2020-04-21 DIAGNOSIS — M5431 Sciatica, right side: Secondary | ICD-10-CM | POA: Diagnosis not present

## 2020-04-22 ENCOUNTER — Encounter: Payer: Self-pay | Admitting: *Deleted

## 2020-04-27 ENCOUNTER — Other Ambulatory Visit: Payer: Self-pay

## 2020-04-27 ENCOUNTER — Ambulatory Visit (INDEPENDENT_AMBULATORY_CARE_PROVIDER_SITE_OTHER): Payer: Medicare Other | Admitting: Family Medicine

## 2020-04-27 ENCOUNTER — Encounter: Payer: Self-pay | Admitting: Family Medicine

## 2020-04-27 ENCOUNTER — Ambulatory Visit (INDEPENDENT_AMBULATORY_CARE_PROVIDER_SITE_OTHER): Payer: Medicare Other

## 2020-04-27 VITALS — BP 110/69 | HR 83 | Temp 97.2°F | Ht 64.0 in | Wt 166.4 lb

## 2020-04-27 DIAGNOSIS — Z23 Encounter for immunization: Secondary | ICD-10-CM

## 2020-04-27 DIAGNOSIS — Z9889 Other specified postprocedural states: Secondary | ICD-10-CM

## 2020-04-27 DIAGNOSIS — M545 Low back pain, unspecified: Secondary | ICD-10-CM | POA: Diagnosis not present

## 2020-04-27 DIAGNOSIS — M5441 Lumbago with sciatica, right side: Secondary | ICD-10-CM

## 2020-04-27 MED ORDER — METHYLPREDNISOLONE ACETATE 40 MG/ML IJ SUSP
40.0000 mg | Freq: Once | INTRAMUSCULAR | Status: AC
  Administered 2020-04-27: 40 mg via INTRAMUSCULAR

## 2020-04-27 NOTE — Progress Notes (Signed)
Subjective: CC: ER follow-up PCP: Janora Norlander, DO [id number] Meredith Anderson is a 78 y.o. female presenting to clinic today for:  1.  ER follow-up Patient is here for ER follow-up.  She was seen in the emergency department for right hip pain.  She reports ongoing right pain that radiates into her right buttock and leg.  She had imaging of the right hip but this was fairly unremarkable.  She has not followed up with orthopedics.  She has an orthopedist in [place].  She is utilizing medications that were prescribed as directed but has not found them to be especially helpful.  Does not report any falls but has been requiring extra assistance with ambulation as standing does cause the symptoms to be much worse.  She does not report any sensory changes.  Medical history significant for L4 on L5 disc repair over 10 years ago.  ROS: Per HPI  Allergies  Allergen Reactions  . Dofetilide Other (See Comments)    Passed out  . Doxycycline     diarrhea   . Amlodipine     Other reaction(s): Other (See Comments) Swollen ankles  . Dronedarone Other (See Comments)    Increased BG  . Glipizide-Metformin Hcl     Other reaction(s): Diarrhea, Other (See Comments) Weakness  . Metformin     Other reaction(s): Diarrhea  . Niaspan [Niacin] Rash   Past Medical History:  Diagnosis Date  . A-fib ([place])   . Allergy    seasonal  . Anxiety   . Arthritis   . 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 Cataract   . Depression 12/12/2015  . Dyshydrosis   . Hemorrhoids   . Hyperlipidemia   . Hypertension   . Menopause   . NIDDM (non-insulin dependent diabetes mellitus)    diet controlled   . Osteopenia 2016  . Thyroid disease     Current Outpatient Medications:  .  acetaminophen (TYLENOL) 500 MG tablet, Take by mouth., Disp: , Rfl:  .  azelastine (ASTELIN) 0.1 % nasal spray, Place 1 spray into both nostrils 2 (two) times daily., Disp: 30 mL, Rfl: 12 .  cholecalciferol (VITAMIN D) 1000 UNITS  tablet, Take 1,000 Units by mouth daily., Disp: , Rfl:  .  conjugated estrogens (PREMARIN) vaginal cream, USING APPLICATOR PLACE 1 GRAM IN VAGINA EVERY OTHER NIGHT AS INSTRUCTED, Disp: 30 g, Rfl: 3 .  diclofenac Sodium (VOLTAREN) 1 % GEL, Apply 2 g topically 4 (four) times daily. (prn joint pain), Disp: 400 g, Rfl: 3 .  diltiazem (CARDIZEM CD) 120 MG 24 hr capsule, TAKE 1 CAPSULE BY MOUTH EVERY DAY, Disp: 90 capsule, Rfl: 3 .  escitalopram (LEXAPRO) 10 MG tablet, Take 0.5 tablets (5 mg total) by mouth daily., Disp: 45 tablet, Rfl: 3 .  furosemide (LASIX) 40 MG tablet, Take 1 tablet (40 mg total) by mouth daily., Disp: 90 tablet, Rfl: 3 .  gabapentin (NEURONTIN) 100 MG capsule, TAKE 1 CAPSULE BY MOUTH THREE TIMES A DAY, Disp: 270 capsule, Rfl: 3 .  glucose blood (ACCU-CHEK AVIVA PLUS) test strip, CHECK BLOOD SUGAR ONCE A DAY OR AS INSTRUCTED Dx E11.9, Disp: 100 strip, Rfl: 3 .  levothyroxine (SYNTHROID) 125 MCG tablet, Take 1 tablet (125 mcg total) by mouth daily before breakfast., Disp: 90 tablet, Rfl: 3 .  lidocaine (LIDODERM) 5 %, 2 patches daily., Disp: , Rfl:  .  lidocaine (XYLOCAINE) 2 % jelly, Apply 1 application topically 3 (three) times daily.  Apply to hemorrhoid when painful, Disp: 30 mL, Rfl: 1 .  magnesium oxide (MAG-OX) 400 MG tablet, 400 mg., Disp: , Rfl:  .  Potassium Chloride ER 20 MEQ TBCR, TAKE 1 TABLET BY MOUTH EVERY DAY, Disp: 90 tablet, Rfl: 1 .  PROCTOSOL HC 2.5 % rectal cream, APPLY TO RECTAL AREA EVERY 6 HOURS AS NEEDED FOR HEMORRHOIDS, Disp: 28.35 g, Rfl: 1 .  Rivaroxaban (XARELTO) 15 MG TABS tablet, Take 1 tablet (15 mg total) by mouth daily with supper., Disp: 90 tablet, Rfl: 3 .  rosuvastatin (CRESTOR) 20 MG tablet, Take 1 tablet (20 mg total) by mouth daily., Disp: 90 tablet, Rfl: 3 .  sitaGLIPtin (JANUVIA) 100 MG tablet, Take 1 tablet (100 mg total) by mouth daily., Disp: 90 tablet, Rfl: 1 .  TETRAHYDROZOLINE HCL OP, Apply to eye. Reported on 01/02/2016, Disp: , Rfl:  .   tiZANidine (ZANAFLEX) 4 MG tablet, Take 4 mg by mouth at bedtime., Disp: , Rfl:   Current Facility-Administered Medications:  .  methylPREDNISolone acetate (DEPO-MEDROL) injection 40 mg, 40 mg, Intramuscular, Once, Janora Norlander, DO Social History   Socioeconomic History  . Marital status: Divorced    Spouse name: Not on file  . Number of children: 2  . Years of education: 43  . Highest education level: High school graduate  Occupational History  . Occupation: Retired  Tobacco Use  . Smoking status: Never Smoker  . Smokeless tobacco: Never Used  Vaping Use  . Vaping Use: Never used  Substance and Sexual Activity  . Alcohol use: No  . Drug use: No  . Sexual activity: Not Currently  Other Topics Concern  . Not on file  Social History Narrative  . Not on file   Social Determinants of Health   Financial Resource Strain: Low Risk   . Difficulty of Paying Living Expenses: Not hard at all  Food Insecurity: No Food Insecurity  . Worried About Charity fundraiser in the Last Year: Never true  . Ran Out of Food in the Last Year: Never true  Transportation Needs: No Transportation Needs  . Lack of Transportation (Medical): No  . Lack of Transportation (Non-Medical): No  Physical Activity: Inactive  . Days of Exercise per Week: 0 days  . Minutes of Exercise per Session: 0 min  Stress: No Stress Concern Present  . Feeling of Stress : Not at all  Social Connections: Socially Isolated  . Frequency of Communication with Friends and Family: More than three times a week  . Frequency of Social Gatherings with Friends and Family: More than three times a week  . Attends Religious Services: Never  . Active Member of Clubs or Organizations: No  . Attends Archivist Meetings: Never  . Marital Status: Divorced  Human resources officer Violence: Not At Risk  . Fear of Current or Ex-Partner: No  . Emotionally Abused: No  . Physically Abused: No  . Sexually Abused: No   Family  History  Problem Relation Age of Onset  . Stroke Mother   . Diabetes Mother   . Heart disease Father   . Atrial fibrillation Father   . Cancer Sister        ovarian / colon  . Diabetes Sister   . Heart disease Brother   . Hyperlipidemia Brother   . Hypertension Brother   . Diabetes Brother   . Heart attack Brother 82       had to perform emergency surgery  . Dementia Brother   .  Hyperlipidemia Sister   . Cancer Sister        liver  . Hyperlipidemia Sister   . Diabetes Sister        Boarderline DM  . Cancer Sister   . Diabetes Sister   . Diabetes Daughter   . Rheum arthritis Daughter   . Psoriasis Daughter     Objective: Office vital signs reviewed. BP 110/69   Pulse 83   Temp (!) 97.2 F (36.2 C)   Ht 5\' 4"  (1.626 m)   Wt 166 lb 6.4 oz (75.5 kg)   SpO2 99%   BMI 28.56 kg/m   Physical Examination:  General: Awake, alert, appears uncomfortable MSK: Arrives in wheelchair.  She has tenderness palpation over the right SI joint and right lumbosacral joint as well as along the posterior lateral hip.  Assessment/ Plan: 79 y.o. female   1. Acute right-sided low back pain with right-sided sciatica She was given a corticosteroid injection today in efforts to help her pain.  Plain films were obtained which did show degenerative changes throughout the lower spine.  I think she likely needs an MRI but I hesitate given the fact that she has a pacemaker.  We will try reaching out to her cardiologist to see if this is MRI compatible.  In the interim, I have highly recommended that she follow-up with her orthopedist in [place].  If she needs any referral, she will contact me. - DG Lumbar Spine 2-3 Views; Future - methylPREDNISolone acetate (DEPO-MEDROL) injection 40 mg  2. History of back surgery - DG Lumbar Spine 2-3 Views; Future - methylPREDNISolone acetate (DEPO-MEDROL) injection 40 mg  3. Need for immunization against influenza - Flu Vaccine QUAD High  Dose(Fluad)   Orders Placed This Encounter  Procedures  . DG Lumbar Spine 2-3 Views    Standing Status:   Future    Number of Occurrences:   1    Standing Expiration Date:   07/28/2020    Order Specific Question:   Reason for Exam (SYMPTOM  OR DIAGNOSIS REQUIRED)    Answer:   right sided back pain, history of L4-5 repair    Order Specific Question:   Preferred imaging location?    Answer:   Internal   Meds ordered this encounter  Medications  . methylPREDNISolone acetate (DEPO-MEDROL) injection 40 mg     Janora Norlander, DO [place] [phone]

## 2020-04-27 NOTE — Patient Instructions (Signed)
Set up an appointment with your orthopedist in [place] as soon as possible I will reach out to your heart doctor about your pacemaker and MRI

## 2020-05-03 ENCOUNTER — Telehealth: Payer: Self-pay | Admitting: *Deleted

## 2020-05-03 NOTE — Telephone Encounter (Signed)
Called patient, no answer, left message to return call 

## 2020-05-03 NOTE — Telephone Encounter (Signed)
Spoke with patient. She is aware of Dr. Marjean Donna recommendations.  Patient will contact her orthopedist about moving forward with the MRI. She was informed that the orthopedic physician's office will get her MRI scheduled and make the appropriate steps to get approval through her insurance.  Patient made aware that she will need to be in BOO mode during the scan.

## 2020-05-03 NOTE — Telephone Encounter (Signed)
-----   Message from Janora Norlander, DO sent at 05/03/2020 12:50 PM EDT ----- Please inform patient that I spoke to her cardiologist, Dr Gelene Mink, and her pacemaker is MRI compatible.  She may schedule a visit with her orthopedist to get checked out and they can proceed with MRI safely.  It was recommended that she be placed in BOO mode so that she is continually paced during the imaging study.

## 2020-05-08 ENCOUNTER — Other Ambulatory Visit: Payer: Self-pay | Admitting: Family Medicine

## 2020-05-09 ENCOUNTER — Other Ambulatory Visit: Payer: Self-pay | Admitting: Family Medicine

## 2020-06-01 ENCOUNTER — Ambulatory Visit: Payer: Medicare Other | Admitting: Family Medicine

## 2020-06-08 ENCOUNTER — Other Ambulatory Visit: Payer: Self-pay

## 2020-06-08 ENCOUNTER — Ambulatory Visit (INDEPENDENT_AMBULATORY_CARE_PROVIDER_SITE_OTHER): Payer: Medicare Other | Admitting: Family Medicine

## 2020-06-08 ENCOUNTER — Encounter: Payer: Self-pay | Admitting: Family Medicine

## 2020-06-08 VITALS — BP 138/81 | HR 92 | Temp 97.2°F | Ht 64.0 in | Wt 171.4 lb

## 2020-06-08 DIAGNOSIS — E119 Type 2 diabetes mellitus without complications: Secondary | ICD-10-CM | POA: Diagnosis not present

## 2020-06-08 DIAGNOSIS — E1159 Type 2 diabetes mellitus with other circulatory complications: Secondary | ICD-10-CM | POA: Diagnosis not present

## 2020-06-08 DIAGNOSIS — E1169 Type 2 diabetes mellitus with other specified complication: Secondary | ICD-10-CM | POA: Diagnosis not present

## 2020-06-08 DIAGNOSIS — Z9889 Other specified postprocedural states: Secondary | ICD-10-CM | POA: Diagnosis not present

## 2020-06-08 DIAGNOSIS — I152 Hypertension secondary to endocrine disorders: Secondary | ICD-10-CM | POA: Diagnosis not present

## 2020-06-08 DIAGNOSIS — E785 Hyperlipidemia, unspecified: Secondary | ICD-10-CM

## 2020-06-08 LAB — BAYER DCA HB A1C WAIVED: HB A1C (BAYER DCA - WAIVED): 6.4 % (ref <7.0)

## 2020-06-08 NOTE — Progress Notes (Signed)
Subjective: CC: DM PCP: Janora Norlander, DO OMB:TDHR Meredith Anderson is a 79 y.o. female presenting to clinic today for:  1. Type 2 Diabetes with hypertension, hyperlipidemia:  Patient reports compliance with Januvia, Cardizem, gabapentin and Crestor.  No chest pain, shortness of breath, dizziness or blurred vision Patient's blood sugars have been around the 170s.  She is been intolerant to multiple medications including Metformin, Farxiga.  Last eye exam: scheduled January Last foot exam: UTD Last A1c:  Lab Results  Component Value Date   HGBA1C 8.0 (Meredith) 03/02/2020   Nephropathy screen indicated?: UTD Last flu, zoster and/or pneumovax:  Immunization History  Administered Date(s) Administered  . Fluad Quad(high Dose 65+) 03/25/2019, 04/27/2020  . Influenza Whole 04/01/2012  . Influenza, High Dose Seasonal PF 04/17/2017, 04/16/2018  . Influenza,inj,Quad PF,6+ Mos 04/03/2013, 04/03/2016  . Influenza-Unspecified 03/15/2014  . Moderna SARS-COVID-2 Vaccination 07/21/2019, 08/18/2019  . Pneumococcal Conjugate-13 05/17/2014  . Pneumococcal Polysaccharide-23 08/02/2009  . Tdap 01/31/2012  . Zoster 01/31/2012    2. Back pain Patient had quite a bit of back pain in October.  She had an x-ray performed but she notes that her back doctor wanted to have a copy of that x-ray before he would proceed with MRI, etc.  To summarize her cardiologist did state that she had an MRI compatible pacemaker but that it needed to be placed in BOO mode.  Back pain is still present but much better than previous.  ROS: Per HPI  Allergies  Allergen Reactions  . Dofetilide Other (See Comments)    Passed out  . Doxycycline     diarrhea   . Amlodipine     Other reaction(s): Other (See Comments) Swollen ankles  . Dronedarone Other (See Comments)    Increased BG  . Glipizide-Metformin Hcl     Other reaction(s): Diarrhea, Other (See Comments) Weakness  . Metformin     Other reaction(s): Diarrhea  .  Niaspan [Niacin] Rash   Past Medical History:  Diagnosis Date  . A-fib ([place])   . Allergy    seasonal  . Anxiety   . Arthritis   . 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 Cataract   . Depression 12/12/2015  . Dyshydrosis   . Hemorrhoids   . Hyperlipidemia   . Hypertension   . Menopause   . NIDDM (non-insulin dependent diabetes mellitus)    diet controlled   . Osteopenia 2016  . Thyroid disease     Current Outpatient Medications:  .  acetaminophen (TYLENOL) 500 MG tablet, Take by mouth., Disp: , Rfl:  .  azelastine (ASTELIN) 0.1 % nasal spray, Place 1 spray into both nostrils 2 (two) times daily., Disp: 30 mL, Rfl: 12 .  cholecalciferol (VITAMIN D) 1000 UNITS tablet, Take 1,000 Units by mouth daily., Disp: , Rfl:  .  conjugated estrogens (PREMARIN) vaginal cream, USING APPLICATOR PLACE 1 GRAM IN VAGINA EVERY OTHER NIGHT AS INSTRUCTED, Disp: 30 g, Rfl: 3 .  diclofenac Sodium (VOLTAREN) 1 % GEL, Apply 2 g topically 4 (four) times daily. (prn joint pain), Disp: 400 g, Rfl: 3 .  diltiazem (CARDIZEM CD) 120 MG 24 hr capsule, TAKE 1 CAPSULE BY MOUTH EVERY DAY, Disp: 90 capsule, Rfl: 3 .  escitalopram (LEXAPRO) 10 MG tablet, Take 0.5 tablets (5 mg total) by mouth daily., Disp: 45 tablet, Rfl: 3 .  furosemide (LASIX) 40 MG tablet, Take 1 tablet (40 mg total) by mouth daily., Disp: 90 tablet, Rfl: 3 .  gabapentin (NEURONTIN) 100 MG capsule, TAKE 1 CAPSULE BY MOUTH THREE TIMES A DAY, Disp: 270 capsule, Rfl: 3 .  glucose blood (ACCU-CHEK AVIVA PLUS) test strip, CHECK BLOOD SUGAR ONCE A DAY OR AS INSTRUCTED Dx E11.9, Disp: 100 strip, Rfl: 3 .  JANUVIA 100 MG tablet, TAKE 1 TABLET BY MOUTH EVERY DAY, Disp: 90 tablet, Rfl: 0 .  levothyroxine (SYNTHROID) 125 MCG tablet, Take 1 tablet (125 mcg total) by mouth daily before breakfast., Disp: 90 tablet, Rfl: 3 .  lidocaine (LIDODERM) 5 %, PLACE ONE PATCH ONTO THE SKIN EVERY 12HRS. REMOVE & DISCARD PATCH WITHIN 12HRS OR AS DIRECTED BY MD, Disp:  10 patch, Rfl: 5 .  lidocaine (XYLOCAINE) 2 % jelly, Apply 1 application topically 3 (three) times daily. Apply to hemorrhoid when painful, Disp: 30 mL, Rfl: 1 .  magnesium oxide (MAG-OX) 400 MG tablet, 400 mg., Disp: , Rfl:  .  Potassium Chloride ER 20 MEQ TBCR, TAKE 1 TABLET BY MOUTH EVERY DAY, Disp: 90 tablet, Rfl: 1 .  PROCTOSOL HC 2.5 % rectal cream, APPLY TO RECTAL AREA EVERY 6 HOURS AS NEEDED FOR HEMORRHOIDS, Disp: 28.35 g, Rfl: 1 .  Rivaroxaban (XARELTO) 15 MG TABS tablet, Take 1 tablet (15 mg total) by mouth daily with supper., Disp: 90 tablet, Rfl: 3 .  rosuvastatin (CRESTOR) 20 MG tablet, Take 1 tablet (20 mg total) by mouth daily., Disp: 90 tablet, Rfl: 3 .  TETRAHYDROZOLINE HCL OP, Apply to eye. Reported on 01/02/2016, Disp: , Rfl:  .  tiZANidine (ZANAFLEX) 4 MG tablet, Take 4 mg by mouth at bedtime., Disp: , Rfl:  Social History   Socioeconomic History  . Marital status: Divorced    Spouse name: Not on file  . Number of children: 2  . Years of education: 11  . Highest education level: High school graduate  Occupational History  . Occupation: Retired  Tobacco Use  . Smoking status: Never Smoker  . Smokeless tobacco: Never Used  Vaping Use  . Vaping Use: Never used  Substance and Sexual Activity  . Alcohol use: No  . Drug use: No  . Sexual activity: Not Currently  Other Topics Concern  . Not on file  Social History Narrative  . Not on file   Social Determinants of Health   Financial Resource Strain: Low Risk   . Difficulty of Paying Living Expenses: Not hard at all  Food Insecurity: No Food Insecurity  . Worried About Charity fundraiser in the Last Year: Never true  . Ran Out of Food in the Last Year: Never true  Transportation Needs: No Transportation Needs  . Lack of Transportation (Medical): No  . Lack of Transportation (Non-Medical): No  Physical Activity: Inactive  . Days of Exercise per Week: 0 days  . Minutes of Exercise per Session: 0 min  Stress: No  Stress Concern Present  . Feeling of Stress : Not at all  Social Connections: Socially Isolated  . Frequency of Communication with Friends and Family: More than three times a week  . Frequency of Social Gatherings with Friends and Family: More than three times a week  . Attends Religious Services: Never  . Active Member of Clubs or Organizations: No  . Attends Archivist Meetings: Never  . Marital Status: Divorced  Human resources officer Violence: Not At Risk  . Fear of Current or Ex-Partner: No  . Emotionally Abused: No  . Physically Abused: No  . Sexually Abused: No   Family History  Problem Relation Age of Onset  . Stroke Mother   . Diabetes Mother   . Heart disease Father   . Atrial fibrillation Father   . Cancer Sister        ovarian / colon  . Diabetes Sister   . Heart disease Brother   . Hyperlipidemia Brother   . Hypertension Brother   . Diabetes Brother   . Heart attack Brother 70       had to perform emergency surgery  . Dementia Brother   . Hyperlipidemia Sister   . Cancer Sister        liver  . Hyperlipidemia Sister   . Diabetes Sister        [place] DM  . Cancer Sister   . Diabetes Sister   . Diabetes Daughter   . Rheum arthritis Daughter   . Psoriasis Daughter     Objective: Office vital signs reviewed. BP 138/81   Pulse 92   Temp (!) 97.2 F (36.2 C)   Ht 5\' 4"  (1.626 m)   Wt 171 lb 6.4 oz (77.7 kg)   SpO2 93%   BMI 29.42 kg/m   Physical Examination:  General: Awake, alert, well nourished, No acute distress HEENT: Normal; sclera white Cardio: regular rate and rhythm, S1S2 heard, no murmurs appreciated Pulm: clear to auscultation bilaterally, no wheezes, rhonchi or rales; normal work of breathing on room air Extremities: warm, well perfused, No edema, cyanosis or clubbing; +2 pulses bilaterally MSK: Ambulating independently  Assessment/ Plan: 79 y.o. female   Type 2 diabetes mellitus without complication, without long-term  current use of insulin ([facility]) - Plan: Bayer DCA Hb A1c Waived, Basic Metabolic Panel  Hypertension associated with diabetes ([place]) - Plan: Basic Metabolic Panel  Hyperlipidemia associated with type 2 diabetes mellitus ([place])  History of back surgery  Sugar has improved tremendously with the Januvia.  She is down to 6.4 today.  She will continue current dose of Januvia.  Continue statin.  Continue antihypertensive.  She may follow-up in 4 months, sooner if needed.  She will have her diabetic eye exam sent to our office  We will see if we can get a disc burned for her physician so they may determine whether or not to proceed with MRI.  No orders of the defined types were placed in this encounter.  No orders of the defined types were placed in this encounter.    Janora Norlander, DO [place] [phone]

## 2020-06-09 LAB — BASIC METABOLIC PANEL
BUN/Creatinine Ratio: 13 (ref 12–28)
BUN: 11 mg/dL (ref 8–27)
CO2: 25 mmol/L (ref 20–29)
Calcium: 9.8 mg/dL (ref 8.7–10.3)
Chloride: 99 mmol/L (ref 96–106)
Creatinine, Ser: 0.88 mg/dL (ref 0.57–1.00)
GFR calc Af Amer: 73 mL/min/{1.73_m2} (ref >59)
GFR calc non Af Amer: 63 mL/min/{1.73_m2} (ref >59)
Glucose: 161 mg/dL — ABNORMAL HIGH (ref 65–99)
Potassium: 3.8 mmol/L (ref 3.5–5.2)
Sodium: 140 mmol/L (ref 134–144)

## 2020-06-09 NOTE — Progress Notes (Signed)
Patient notified that disk with x-rays is ready and up front for her to pick up

## 2020-06-15 DIAGNOSIS — I10 Essential (primary) hypertension: Secondary | ICD-10-CM | POA: Diagnosis not present

## 2020-06-15 DIAGNOSIS — I480 Paroxysmal atrial fibrillation: Secondary | ICD-10-CM | POA: Diagnosis not present

## 2020-06-15 DIAGNOSIS — I483 Typical atrial flutter: Secondary | ICD-10-CM | POA: Diagnosis not present

## 2020-06-16 DIAGNOSIS — Z950 Presence of cardiac pacemaker: Secondary | ICD-10-CM | POA: Diagnosis not present

## 2020-06-16 DIAGNOSIS — I4892 Unspecified atrial flutter: Secondary | ICD-10-CM | POA: Diagnosis not present

## 2020-06-17 DIAGNOSIS — Z45018 Encounter for adjustment and management of other part of cardiac pacemaker: Secondary | ICD-10-CM | POA: Diagnosis not present

## 2020-06-17 DIAGNOSIS — I480 Paroxysmal atrial fibrillation: Secondary | ICD-10-CM | POA: Diagnosis not present

## 2020-06-17 DIAGNOSIS — I4891 Unspecified atrial fibrillation: Secondary | ICD-10-CM | POA: Diagnosis not present

## 2020-07-02 ENCOUNTER — Other Ambulatory Visit: Payer: Self-pay | Admitting: Family Medicine

## 2020-07-02 DIAGNOSIS — I10 Essential (primary) hypertension: Secondary | ICD-10-CM

## 2020-07-05 ENCOUNTER — Telehealth: Payer: Self-pay | Admitting: *Deleted

## 2020-07-05 NOTE — Telephone Encounter (Signed)
Error

## 2020-07-31 ENCOUNTER — Other Ambulatory Visit: Payer: Self-pay | Admitting: Family Medicine

## 2020-08-11 ENCOUNTER — Other Ambulatory Visit: Payer: Self-pay | Admitting: Family Medicine

## 2020-08-12 ENCOUNTER — Telehealth: Payer: Self-pay | Admitting: *Deleted

## 2020-08-12 MED ORDER — HYDROCORTISONE (PERIANAL) 2.5 % EX CREA: 1.0000 "application " | TOPICAL_CREAM | Freq: Two times a day (BID) | CUTANEOUS | 0 refills | Status: DC

## 2020-08-12 NOTE — Telephone Encounter (Signed)
done

## 2020-08-12 NOTE — Telephone Encounter (Signed)
Fax from [facility] request for Proctosol-HC 2.5% cream 28.35 gm apply to rectal area Q6 hrs prn for hemorrhoids Quick button says no longer available for ordering, please place new order Last OV 06/08/20. Last RF 05/09/18. Next OV 10/07/20

## 2020-08-12 NOTE — Addendum Note (Signed)
Addended by: Janora Norlander on: 08/12/2020 02:15 PM   Modules accepted: Orders

## 2020-08-22 ENCOUNTER — Other Ambulatory Visit: Payer: Self-pay

## 2020-08-22 ENCOUNTER — Encounter: Payer: Self-pay | Admitting: Nurse Practitioner

## 2020-08-22 ENCOUNTER — Ambulatory Visit (INDEPENDENT_AMBULATORY_CARE_PROVIDER_SITE_OTHER): Payer: Medicare Other | Admitting: Nurse Practitioner

## 2020-08-22 VITALS — BP 134/77 | HR 76 | Temp 97.1°F | Ht 64.0 in | Wt 168.2 lb

## 2020-08-22 DIAGNOSIS — M5441 Lumbago with sciatica, right side: Secondary | ICD-10-CM

## 2020-08-22 DIAGNOSIS — G8929 Other chronic pain: Secondary | ICD-10-CM

## 2020-08-22 MED ORDER — METHYLPREDNISOLONE ACETATE 40 MG/ML IJ SUSP
40.0000 mg | Freq: Once | INTRAMUSCULAR | Status: AC
  Administered 2020-08-22: 40 mg via INTRAMUSCULAR

## 2020-08-22 MED ORDER — LIDOCAINE 5 % EX PTCH: MEDICATED_PATCH | CUTANEOUS | 5 refills | Status: DC

## 2020-08-22 NOTE — Progress Notes (Signed)
Acute Office Visit  Subjective:    Patient ID: Meredith Anderson, female    DOB: 1940/08/24, 80 y.o.   MRN: [id number]  Chief Complaint  Patient presents with  . Back Pain    Radiates down right leg x 3 days    Hip Pain  The incident occurred 3 to 5 days ago. The incident occurred at home. There was no injury mechanism. The pain is present in the right hip. The pain is moderate. The pain has been worsening since onset. Pertinent negatives include no muscle weakness, numbness or tingling. She reports no foreign bodies present. The symptoms are aggravated by movement and palpation. She has tried acetaminophen for the symptoms. The treatment provided mild relief.     Past Medical History:  Diagnosis Date  . A-fib ([place])   . Allergy    seasonal  . Anxiety   . Arthritis   . 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 Cataract   . Depression 12/12/2015  . Dyshydrosis   . Hemorrhoids   . Hyperlipidemia   . Hypertension   . Menopause   . NIDDM (non-insulin dependent diabetes mellitus)    diet controlled   . Osteopenia 2016  . Thyroid disease     Past Surgical History:  Procedure Laterality Date  . ABDOMINAL HYSTERECTOMY     partial, prolapse  . ABLATION    . bunionectomy bilateral    . EYE SURGERY Bilateral    cataracts  . LUMBAR DISC SURGERY     L4 and L5  . PACEMAKER INSERTION  12/14/2015  . ROTATOR CUFF REPAIR Right   . TUBAL LIGATION      Family History  Problem Relation Age of Onset  . Stroke Mother   . Diabetes Mother   . Heart disease Father   . Atrial fibrillation Father   . Cancer Sister        ovarian / colon  . Diabetes Sister   . Heart disease Brother   . Hyperlipidemia Brother   . Hypertension Brother   . Diabetes Brother   . Heart attack Brother 10       had to perform emergency surgery  . Dementia Brother   . Hyperlipidemia Sister   . Cancer Sister        liver  . Hyperlipidemia Sister   . Diabetes Sister        Boarderline DM  . Cancer  Sister   . Diabetes Sister   . Diabetes Daughter   . Rheum arthritis Daughter   . Psoriasis Daughter     Social History   Socioeconomic History  . Marital status: Divorced    Spouse name: Not on file  . Number of children: 2  . Years of education: 63  . Highest education level: High school graduate  Occupational History  . Occupation: Retired  Tobacco Use  . Smoking status: Never Smoker  . Smokeless tobacco: Never Used  Vaping Use  . Vaping Use: Never used  Substance and Sexual Activity  . Alcohol use: No  . Drug use: No  . Sexual activity: Not Currently  Other Topics Concern  . Not on file  Social History Narrative  . Not on file   Social Determinants of Health   Financial Resource Strain: Low Risk   . Difficulty of Paying Living Expenses: Not hard at all  Food Insecurity: No Food Insecurity  . Worried About Charity fundraiser in the Last  Year: Never true  . Ran Out of Food in the Last Year: Never true  Transportation Needs: No Transportation Needs  . Lack of Transportation (Medical): No  . Lack of Transportation (Non-Medical): No  Physical Activity: Inactive  . Days of Exercise per Week: 0 days  . Minutes of Exercise per Session: 0 min  Stress: No Stress Concern Present  . Feeling of Stress : Not at all  Social Connections: Socially Isolated  . Frequency of Communication with Friends and Family: More than three times a week  . Frequency of Social Gatherings with Friends and Family: More than three times a week  . Attends Religious Services: Never  . Active Member of Clubs or Organizations: No  . Attends Archivist Meetings: Never  . Marital Status: Divorced  Human resources officer Violence: Not At Risk  . Fear of Current or Ex-Partner: No  . Emotionally Abused: No  . Physically Abused: No  . Sexually Abused: No    Outpatient Medications Prior to Visit  Medication Sig Dispense Refill  . acetaminophen (TYLENOL) 500 MG tablet Take by mouth.    [place] Kitchen  azelastine (ASTELIN) 0.1 % nasal spray Place 1 spray into both nostrils 2 (two) times daily. 30 mL 12  . cholecalciferol (VITAMIN D) 1000 UNITS tablet Take 1,000 Units by mouth daily.    . diclofenac Sodium (VOLTAREN) 1 % GEL Apply 2 g topically 4 (four) times daily. (prn joint pain) 400 g 3  . diltiazem (CARDIZEM CD) 120 MG 24 hr capsule TAKE 1 CAPSULE BY MOUTH EVERY DAY 90 capsule 3  . escitalopram (LEXAPRO) 10 MG tablet Take 0.5 tablets (5 mg total) by mouth daily. 45 tablet 3  . furosemide (LASIX) 40 MG tablet Take 1 tablet (40 mg total) by mouth daily. 90 tablet 3  . gabapentin (NEURONTIN) 100 MG capsule TAKE 1 CAPSULE BY MOUTH THREE TIMES A DAY 270 capsule 3  . glucose blood (ACCU-CHEK AVIVA PLUS) test strip CHECK BLOOD SUGAR ONCE A DAY OR AS INSTRUCTED Dx E11.9 100 strip 3  . hydrocortisone (PROCTOSOL HC) 2.5 % rectal cream Place 1 application rectally 2 (two) times daily. As needed for hemorrhoid flares. Max use is 7 consecutive days 30 g 0  . JANUVIA 100 MG tablet TAKE 1 TABLET BY MOUTH EVERY DAY 90 tablet 0  . levothyroxine (SYNTHROID) 125 MCG tablet Take 1 tablet (125 mcg total) by mouth daily before breakfast. 90 tablet 3  . lidocaine (XYLOCAINE) 2 % jelly Apply 1 application topically 3 (three) times daily. Apply to hemorrhoid when painful 30 mL 1  . magnesium oxide (MAG-OX) 400 MG tablet 400 mg.    . Potassium Chloride ER 20 MEQ TBCR TAKE 1 TABLET BY MOUTH EVERY DAY 90 tablet 1  . PREMARIN vaginal cream USING APPLICATOR PLACE 1 GRAM IN VAGINA EVERY OTHER NIGHT AS INSTRUCTED 30 g 3  . Rivaroxaban (XARELTO) 15 MG TABS tablet Take 1 tablet (15 mg total) by mouth daily with supper. 90 tablet 3  . rosuvastatin (CRESTOR) 20 MG tablet Take 1 tablet (20 mg total) by mouth daily. 90 tablet 3  . TETRAHYDROZOLINE HCL OP Apply to eye. Reported on 01/02/2016    . tiZANidine (ZANAFLEX) 4 MG tablet Take 4 mg by mouth at bedtime.    . lidocaine (LIDODERM) 5 % PLACE ONE PATCH ONTO THE SKIN EVERY 12HRS.  REMOVE & DISCARD PATCH WITHIN 12HRS OR AS DIRECTED BY MD 10 patch 5   No facility-administered medications prior to visit.  Allergies  Allergen Reactions  . Dofetilide Other (See Comments)    Passed out  . Doxycycline     diarrhea   . Amlodipine     Other reaction(s): Other (See Comments) Swollen ankles  . Dronedarone Other (See Comments)    Increased BG  . Glipizide-Metformin Hcl     Other reaction(s): Diarrhea, Other (See Comments) Weakness  . Metformin     Other reaction(s): Diarrhea  . Niaspan [Niacin] Rash    Review of Systems  Constitutional: Negative.   HENT: Negative.   Eyes: Negative.   Respiratory: Negative.   Cardiovascular: Negative.   Endocrine: Negative.   Genitourinary: Negative.   Musculoskeletal: Positive for back pain.       Hip pain  Skin: Negative.   Neurological: Negative.  Negative for tingling and numbness.  Hematological: Negative.   All other systems reviewed and are negative.      Objective:    Physical Exam Vitals reviewed.  Constitutional:      Appearance: Normal appearance.  HENT:     Head: Normocephalic.     Nose: Nose normal.  Cardiovascular:     Rate and Rhythm: Normal rate and regular rhythm.  Pulmonary:     Effort: Pulmonary effort is normal.     Breath sounds: Normal breath sounds.  Abdominal:     General: Bowel sounds are normal.  Musculoskeletal:        General: Tenderness present.  Skin:    General: Skin is warm.  Neurological:     Mental Status: She is alert and oriented to person, place, and time.  Psychiatric:        Behavior: Behavior normal.     BP 134/77   Pulse 76   Temp (!) 97.1 F (36.2 C)   Ht 5\' 4"  (1.626 m)   Wt 168 lb 3.2 oz (76.3 kg)   SpO2 100%   BMI 28.87 kg/m  Wt Readings from Last 3 Encounters:  08/22/20 168 lb 3.2 oz (76.3 kg)  06/08/20 171 lb 6.4 oz (77.7 kg)  04/27/20 166 lb 6.4 oz (75.5 kg)    Health Maintenance Due  Topic Date Due  . Hepatitis C Screening  Never done   . DEXA SCAN  08/11/2016  . COVID-19 Vaccine (3 - Moderna risk 4-dose series) 09/15/2019  . OPHTHALMOLOGY EXAM  03/02/2020    There are no preventive care reminders to display for this patient.   Lab Results  Component Value Date   TSH 2.760 12/01/2019   Lab Results  Component Value Date   WBC 4.7 12/01/2019   HGB 12.7 12/01/2019   HCT 37.6 12/01/2019   MCV 90 12/01/2019   PLT 181 12/01/2019   Lab Results  Component Value Date   NA 140 06/08/2020   K 3.8 06/08/2020   CO2 25 06/08/2020   GLUCOSE 161 (H) 06/08/2020   BUN 11 06/08/2020   CREATININE 0.88 06/08/2020   BILITOT 0.7 12/01/2019   ALKPHOS 100 12/01/2019   AST 18 12/01/2019   ALT 12 12/01/2019   PROT 7.7 12/01/2019   ALBUMIN 4.9 (H) 12/01/2019   CALCIUM 9.8 06/08/2020   Lab Results  Component Value Date   CHOL 97 (L) 12/01/2019   Lab Results  Component Value Date   HDL 31 (L) 12/01/2019   Lab Results  Component Value Date   LDLCALC 40 12/01/2019   Lab Results  Component Value Date   TRIG 150 (H) 12/01/2019   Lab Results  Component Value Date  CHOLHDL 3.1 12/01/2019   Lab Results  Component Value Date   HGBA1C 6.4 06/08/2020       Assessment & Plan:   Problem List Items Addressed This Visit      Other   Low back pain - Primary    Ongoing lower back pain with worsening symptoms in the last 3 to 5 days.  Advised patient to continue Lidoderm patch, Depo-Medrol shot given in office.  Referral to back and spine surgery. Education provided to patient with printed handouts given. Rx sent to pharmacy Follow-up with worsening or unresolved symptoms.      Relevant Medications   lidocaine (LIDODERM) 5 %   Other Relevant Orders   Ambulatory referral to Spine Surgery       Meds ordered this encounter  Medications  . methylPREDNISolone acetate (DEPO-MEDROL) injection 40 mg  . lidocaine (LIDODERM) 5 %    Sig: PLACE ONE PATCH ONTO THE SKIN EVERY 12HRS. REMOVE & DISCARD PATCH WITHIN 12HRS OR  AS DIRECTED BY MD    Dispense:  10 patch    Refill:  5    DX Code Needed  .    Order Specific Question:   Supervising Provider    Answer:   Janora Norlander [[id number]]     Ivy Lynn, NP

## 2020-08-22 NOTE — Patient Instructions (Signed)
Hip Pain The hip is the joint between the upper legs and the lower pelvis. The bones, cartilage, tendons, and muscles of your hip joint support your body and allow you to move around. Hip pain can range from a minor ache to severe pain in one or both of your hips. The pain may be felt on the inside of the hip joint near the groin, or on the outside near the buttocks and upper thigh. You may also have swelling or stiffness in your hip area. Follow these instructions at home: Managing pain, stiffness, and swelling  If directed, put ice on the painful area. To do this: ? Put ice in a plastic bag. ? Place a towel between your skin and the bag. ? Leave the ice on for 20 minutes, 2-3 times a day.  If directed, apply heat to the affected area as often as told by your health care provider. Use the heat source that your health care provider recommends, such as a moist heat pack or a heating pad. ? Place a towel between your skin and the heat source. ? Leave the heat on for 20-30 minutes. ? Remove the heat if your skin turns bright red. This is especially important if you are unable to feel pain, heat, or cold. You may have a greater risk of getting burned.      Activity  Do exercises as told by your health care provider.  Avoid activities that cause pain. General instructions  Take over-the-counter and prescription medicines only as told by your health care provider.  Keep a journal of your symptoms. Write down: ? How often you have hip pain. ? The location of your pain. ? What the pain feels like. ? What makes the pain worse.  Sleep with a pillow between your legs on your most comfortable side.  Keep all follow-up visits as told by your health care provider. This is important.   Contact a health care provider if:  You cannot put weight on your leg.  Your pain or swelling continues or gets worse after one week.  It gets harder to walk.  You have a fever. Get help right away  if:  You fall.  You have a sudden increase in pain and swelling in your hip.  Your hip is red or swollen or very tender to touch. Summary  Hip pain can range from a minor ache to severe pain in one or both of your hips.  The pain may be felt on the inside of the hip joint near the groin, or on the outside near the buttocks and upper thigh.  Avoid activities that cause pain.  Write down how often you have hip pain, the location of the pain, what makes it worse, and what it feels like. This information is not intended to replace advice given to you by your health care provider. Make sure you discuss any questions you have with your health care provider. Document Revised: 11/03/2018 Document Reviewed: 11/03/2018 Elsevier Patient Education  2021 [employer]  

## 2020-08-22 NOTE — Assessment & Plan Note (Signed)
Ongoing lower back pain with worsening symptoms in the last 3 to 5 days.  Advised patient to continue Lidoderm patch, Depo-Medrol shot given in office.  Referral to back and spine surgery. Education provided to patient with printed handouts given. Rx sent to pharmacy Follow-up with worsening or unresolved symptoms.

## 2020-08-23 ENCOUNTER — Other Ambulatory Visit: Payer: Self-pay | Admitting: Nurse Practitioner

## 2020-08-23 ENCOUNTER — Telehealth: Payer: Self-pay

## 2020-08-23 MED ORDER — PREDNISONE 10 MG (21) PO TBPK: ORAL_TABLET | ORAL | 0 refills | Status: DC

## 2020-08-23 NOTE — Telephone Encounter (Signed)
Prednisone sent to [facility].

## 2020-08-23 NOTE — Telephone Encounter (Signed)
  Incoming Patient Call  08/23/2020  What symptoms do you have? Hip pain  How long have you been sick? Started last friday  Have you been seen for this problem? Yes, yesterday by [place] and was given a shot but would like prescription for prednisone   If your provider decides to give you a prescription, which pharmacy would you like for it to be sent to? [facility]    Patient informed that this information will be sent to the clinical staff for review and that they should receive a follow up call.

## 2020-08-23 NOTE — Telephone Encounter (Signed)
Patient aware.

## 2020-08-27 ENCOUNTER — Other Ambulatory Visit: Payer: Self-pay | Admitting: Family Medicine

## 2020-08-27 DIAGNOSIS — E1159 Type 2 diabetes mellitus with other circulatory complications: Secondary | ICD-10-CM

## 2020-08-27 DIAGNOSIS — I152 Hypertension secondary to endocrine disorders: Secondary | ICD-10-CM

## 2020-09-02 DIAGNOSIS — M5416 Radiculopathy, lumbar region: Secondary | ICD-10-CM | POA: Diagnosis not present

## 2020-09-02 DIAGNOSIS — R030 Elevated blood-pressure reading, without diagnosis of hypertension: Secondary | ICD-10-CM | POA: Diagnosis not present

## 2020-09-05 ENCOUNTER — Other Ambulatory Visit (HOSPITAL_COMMUNITY): Payer: Self-pay | Admitting: Neurological Surgery

## 2020-09-05 DIAGNOSIS — M5416 Radiculopathy, lumbar region: Secondary | ICD-10-CM

## 2020-09-08 ENCOUNTER — Telehealth: Payer: Self-pay

## 2020-09-08 NOTE — Telephone Encounter (Signed)
Patient seen JE on 2/21 and states she is still having hip pain Please review and advise   Treating physician is OFF  PCP is OFF Covering PCP- please advise

## 2020-09-09 ENCOUNTER — Other Ambulatory Visit: Payer: Self-pay | Admitting: Family Medicine

## 2020-09-09 ENCOUNTER — Other Ambulatory Visit: Payer: Self-pay

## 2020-09-09 ENCOUNTER — Encounter: Payer: Self-pay | Admitting: Family Medicine

## 2020-09-09 ENCOUNTER — Ambulatory Visit (INDEPENDENT_AMBULATORY_CARE_PROVIDER_SITE_OTHER): Payer: Medicare Other | Admitting: Family Medicine

## 2020-09-09 VITALS — BP 136/76 | HR 87 | Temp 96.8°F | Ht 64.0 in | Wt 169.2 lb

## 2020-09-09 DIAGNOSIS — M5441 Lumbago with sciatica, right side: Secondary | ICD-10-CM

## 2020-09-09 DIAGNOSIS — G8929 Other chronic pain: Secondary | ICD-10-CM

## 2020-09-09 MED ORDER — GABAPENTIN 100 MG PO CAPS: 200.0000 mg | ORAL_CAPSULE | Freq: Three times a day (TID) | ORAL | 3 refills | Status: DC

## 2020-09-09 MED ORDER — PREDNISONE 20 MG PO TABS: 20.0000 mg | ORAL_TABLET | Freq: Every day | ORAL | 0 refills | Status: AC

## 2020-09-09 NOTE — Telephone Encounter (Signed)
I recommend patient follow up with the Neurosurgeon.

## 2020-09-09 NOTE — Patient Instructions (Signed)

## 2020-09-09 NOTE — Telephone Encounter (Signed)
Attempted to contact - NA 

## 2020-09-09 NOTE — Telephone Encounter (Signed)
200 mg TID

## 2020-09-09 NOTE — Progress Notes (Signed)
Acute Office Visit  Subjective:    Patient ID: Meredith Anderson, female    DOB: April 04, 1941, 80 y.o.   MRN: [id number]  Chief Complaint  Patient presents with  . Hip Pain    HPI Patient is in today for right hip pain. 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 The pain is a 5-6/10 in her right hip. She reports that it feels like a dull ache. She does have shooting pain at times down her right leg. She has tried lidocaine patches, tylenol, volarten gel, gabapentin with little relief. She had a steroid IM injection on 08/22/20 ad this was helpful. She is established with ortho and has an appointment on 09/16/20 for an MRI.   Past Medical History:  Diagnosis Date  . A-fib ([place])   . Allergy    seasonal  . Anxiety   . Arthritis   . 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 Cataract   . Depression 12/12/2015  . Dyshydrosis   . Hemorrhoids   . Hyperlipidemia   . Hypertension   . Menopause   . NIDDM (non-insulin dependent diabetes mellitus)    diet controlled   . Osteopenia 2016  . Thyroid disease     Past Surgical History:  Procedure Laterality Date  . ABDOMINAL HYSTERECTOMY     partial, prolapse  . ABLATION    . bunionectomy bilateral    . EYE SURGERY Bilateral    cataracts  . LUMBAR DISC SURGERY     L4 and L5  . PACEMAKER INSERTION  12/14/2015  . ROTATOR CUFF REPAIR Right   . TUBAL LIGATION      Family History  Problem Relation Age of Onset  . Stroke Mother   . Diabetes Mother   . Heart disease Father   . Atrial fibrillation Father   . Cancer Sister        ovarian / colon  . Diabetes Sister   . Heart disease Brother   . Hyperlipidemia Brother   . Hypertension Brother   . Diabetes Brother   . Heart attack Brother 82       had to perform emergency surgery  . Dementia Brother   . Hyperlipidemia Sister   . Cancer Sister        liver  . Hyperlipidemia Sister   . Diabetes Sister        Boarderline DM  . Cancer Sister    . Diabetes Sister   . Diabetes Daughter   . Rheum arthritis Daughter   . Psoriasis Daughter     Social History   Socioeconomic History  . Marital status: Divorced    Spouse name: Not on file  . Number of children: 2  . Years of education: 17  . Highest education level: High school graduate  Occupational History  . Occupation: Retired  Tobacco Use  . Smoking status: Never Smoker  . Smokeless tobacco: Never Used  Vaping Use  . Vaping Use: Never used  Substance and Sexual Activity  . Alcohol use: No  . Drug use: No  . Sexual activity: Not Currently  Other Topics Concern  . Not on file  Social History Narrative  . Not on file   Social Determinants of Health   Financial Resource Strain: Low Risk   . Difficulty of Paying Living Expenses: Not hard at all  Food Insecurity: No Food Insecurity  . Worried About Charity fundraiser  in the Last Year: Never true  . Ran Out of Food in the Last Year: Never true  Transportation Needs: No Transportation Needs  . Lack of Transportation (Medical): No  . Lack of Transportation (Non-Medical): No  Physical Activity: Inactive  . Days of Exercise per Week: 0 days  . Minutes of Exercise per Session: 0 min  Stress: No Stress Concern Present  . Feeling of Stress : Not at all  Social Connections: Socially Isolated  . Frequency of Communication with Friends and Family: More than three times a week  . Frequency of Social Gatherings with Friends and Family: More than three times a week  . Attends Religious Services: Never  . Active Member of Clubs or Organizations: No  . Attends Archivist Meetings: Never  . Marital Status: Divorced  Human resources officer Violence: Not At Risk  . Fear of Current or Ex-Partner: No  . Emotionally Abused: No  . Physically Abused: No  . Sexually Abused: No    Outpatient Medications Prior to Visit  Medication Sig Dispense Refill  . acetaminophen (TYLENOL) 500 MG tablet Take by mouth.    Marland Kitchen azelastine  (ASTELIN) 0.1 % nasal spray Place 1 spray into both nostrils 2 (two) times daily. 30 mL 12  . cholecalciferol (VITAMIN D) 1000 UNITS tablet Take 1,000 Units by mouth daily.    . diclofenac Sodium (VOLTAREN) 1 % GEL Apply 2 g topically 4 (four) times daily. (prn joint pain) 400 g 3  . diltiazem (CARDIZEM CD) 120 MG 24 hr capsule TAKE 1 CAPSULE BY MOUTH EVERY DAY 90 capsule 0  . escitalopram (LEXAPRO) 10 MG tablet Take 0.5 tablets (5 mg total) by mouth daily. 45 tablet 3  . furosemide (LASIX) 40 MG tablet Take 1 tablet (40 mg total) by mouth daily. 90 tablet 3  . gabapentin (NEURONTIN) 100 MG capsule TAKE 1 CAPSULE BY MOUTH THREE TIMES A DAY 270 capsule 3  . glucose blood (ACCU-CHEK AVIVA PLUS) test strip CHECK BLOOD SUGAR ONCE A DAY OR AS INSTRUCTED Dx E11.9 100 strip 3  . hydrocortisone (PROCTOSOL HC) 2.5 % rectal cream Place 1 application rectally 2 (two) times daily. As needed for hemorrhoid flares. Max use is 7 consecutive days 30 g 0  . JANUVIA 100 MG tablet TAKE 1 TABLET BY MOUTH EVERY DAY 90 tablet 0  . levothyroxine (SYNTHROID) 125 MCG tablet Take 1 tablet (125 mcg total) by mouth daily before breakfast. 90 tablet 3  . lidocaine (LIDODERM) 5 % PLACE ONE PATCH ONTO THE SKIN EVERY 12HRS. REMOVE & DISCARD PATCH WITHIN 12HRS OR AS DIRECTED BY MD 10 patch 5  . lidocaine (XYLOCAINE) 2 % jelly Apply 1 application topically 3 (three) times daily. Apply to hemorrhoid when painful 30 mL 1  . magnesium oxide (MAG-OX) 400 MG tablet 400 mg.    . Potassium Chloride ER 20 MEQ TBCR TAKE 1 TABLET BY MOUTH EVERY DAY 90 tablet 1  . PREMARIN vaginal cream USING APPLICATOR PLACE 1 GRAM IN VAGINA EVERY OTHER NIGHT AS INSTRUCTED 30 g 3  . Rivaroxaban (XARELTO) 15 MG TABS tablet Take 1 tablet (15 mg total) by mouth daily with supper. 90 tablet 3  . rosuvastatin (CRESTOR) 20 MG tablet TAKE 1 TABLET BY MOUTH EVERY DAY 90 tablet 0  . TETRAHYDROZOLINE HCL OP Apply to eye. Reported on 01/02/2016    . tiZANidine  (ZANAFLEX) 4 MG tablet Take 4 mg by mouth at bedtime.    . predniSONE (STERAPRED UNI-PAK 21 TAB) 10  MG (21) TBPK tablet 6 tablets day 1, 5 tablet day 2, 4 tablets day 3, 3 tablets day 4, 2 tablets day 5, 1 tablet day 6. 1 each 0   No facility-administered medications prior to visit.    Allergies  Allergen Reactions  . Dofetilide Other (See Comments)    Passed out  . Doxycycline     diarrhea   . Amlodipine     Other reaction(s): Other (See Comments) Swollen ankles  . Dronedarone Other (See Comments)    Increased BG  . Glipizide-Metformin Hcl     Other reaction(s): Diarrhea, Other (See Comments) Weakness  . Metformin     Other reaction(s): Diarrhea  . Niaspan [Niacin] Rash    Review of Systems As per HPI.     Objective:    Physical Exam Vitals and nursing note reviewed.  Constitutional:      Appearance: Normal appearance.  Cardiovascular:     Rate and Rhythm: Normal rate and regular rhythm.     Heart sounds: Normal heart sounds. No murmur heard.   Pulmonary:     Effort: Pulmonary effort is normal. No respiratory distress.     Breath sounds: Normal breath sounds.  Abdominal:     General: Bowel sounds are normal. There is no distension.     Palpations: Abdomen is soft.     Tenderness: There is no abdominal tenderness.  Musculoskeletal:     Lumbar back: No swelling, deformity or tenderness. Positive right straight leg raise test.     Right hip: No tenderness or bony tenderness.     Right lower leg: No edema.     Left lower leg: No edema.  Skin:    General: Skin is warm and dry.  Neurological:     General: No focal deficit present.     Mental Status: She is alert and oriented to person, place, and time.     Gait: Gait abnormal (using cane).  Psychiatric:        Mood and Affect: Mood normal.        Behavior: Behavior normal.        Thought Content: Thought content normal.        Judgment: Judgment normal.     BP 136/76   Pulse 87   Temp (!) 96.8 F (36 C)  (Temporal)   Ht 5\' 4"  (1.626 m)   Wt 169 lb 4 oz (76.8 kg)   BMI 29.05 kg/m  Wt Readings from Last 3 Encounters:  09/09/20 169 lb 4 oz (76.8 kg)  08/22/20 168 lb 3.2 oz (76.3 kg)  06/08/20 171 lb 6.4 oz (77.7 kg)    Health Maintenance Due  Topic Date Due  . Hepatitis C Screening  Never done  . DEXA SCAN  08/11/2016  . COVID-19 Vaccine (3 - Moderna risk 4-dose series) 09/15/2019  . OPHTHALMOLOGY EXAM  03/02/2020    There are no preventive care reminders to display for this patient.   Lab Results  Component Value Date   TSH 2.760 12/01/2019   Lab Results  Component Value Date   WBC 4.7 12/01/2019   HGB 12.7 12/01/2019   HCT 37.6 12/01/2019   MCV 90 12/01/2019   PLT 181 12/01/2019   Lab Results  Component Value Date   NA 140 06/08/2020   K 3.8 06/08/2020   CO2 25 06/08/2020   GLUCOSE 161 (H) 06/08/2020   BUN 11 06/08/2020   CREATININE 0.88 06/08/2020   BILITOT 0.7 12/01/2019   ALKPHOS 100  12/01/2019   AST 18 12/01/2019   ALT 12 12/01/2019   PROT 7.7 12/01/2019   ALBUMIN 4.9 (H) 12/01/2019   CALCIUM 9.8 06/08/2020   Lab Results  Component Value Date   CHOL 97 (L) 12/01/2019   Lab Results  Component Value Date   HDL 31 (L) 12/01/2019   Lab Results  Component Value Date   LDLCALC 40 12/01/2019   Lab Results  Component Value Date   TRIG 150 (H) 12/01/2019   Lab Results  Component Value Date   CHOLHDL 3.1 12/01/2019   Lab Results  Component Value Date   HGBA1C 6.4 06/08/2020       Assessment & Plan:   Marnee was seen today for hip pain.  Diagnoses and all orders for this visit:  Chronic right-sided low back pain with right-sided sciatica MRI scheduled for 09/16/20. Established with ortho. Will give prednisone burst and increase gabapentin. Discussed unable to do NSAIDs due to kidney function and uncomfortable with doing narcotics due to age. Continue tylenol, lidocaine patches, and voltaren gel. Follow up with ortho as scheduled.  -      predniSONE (DELTASONE) 20 MG tablet; Take 1 tablet (20 mg total) by mouth daily with breakfast for 5 days. -     gabapentin (NEURONTIN) 100 MG capsule; Take 2 capsules (200 mg total) by mouth 3 (three) times daily. TAKE 1 CAPSULE BY MOUTH THREE TIMES A DAY  Return to office for new or worsening symptoms, or if symptoms persist.    Gwenlyn Perking, FNP

## 2020-09-16 ENCOUNTER — Encounter (HOSPITAL_COMMUNITY): Payer: Self-pay

## 2020-09-16 ENCOUNTER — Ambulatory Visit (HOSPITAL_COMMUNITY)
Admission: RE | Admit: 2020-09-16 | Discharge: 2020-09-16 | Disposition: A | Discharge status: Home / Self Care | Payer: Medicare Other | Source: Ambulatory Visit | Attending: Neurological Surgery | Admitting: Neurological Surgery

## 2020-09-16 ENCOUNTER — Other Ambulatory Visit: Payer: Self-pay

## 2020-09-16 DIAGNOSIS — M545 Low back pain, unspecified: Secondary | ICD-10-CM | POA: Diagnosis not present

## 2020-09-16 DIAGNOSIS — M5416 Radiculopathy, lumbar region: Secondary | ICD-10-CM | POA: Diagnosis not present

## 2020-09-16 DIAGNOSIS — I4891 Unspecified atrial fibrillation: Secondary | ICD-10-CM | POA: Diagnosis not present

## 2020-09-16 DIAGNOSIS — Z45018 Encounter for adjustment and management of other part of cardiac pacemaker: Secondary | ICD-10-CM | POA: Diagnosis not present

## 2020-09-16 NOTE — Progress Notes (Signed)
Patient here today at [facility] for MRI lumbar spine wo contrast. Patient has medtronic device and is followed at [facility]. [facility] express sent to mike-Rep and Renee-cardiology PA. Ronalee Belts to the bedside to check thresholds. Programmed VOO 80. Will re-program once scan is completed.

## 2020-09-23 DIAGNOSIS — M5416 Radiculopathy, lumbar region: Secondary | ICD-10-CM | POA: Diagnosis not present

## 2020-10-04 ENCOUNTER — Ambulatory Visit (INDEPENDENT_AMBULATORY_CARE_PROVIDER_SITE_OTHER): Payer: Medicare Other | Admitting: Nurse Practitioner

## 2020-10-04 ENCOUNTER — Encounter: Payer: Self-pay | Admitting: Nurse Practitioner

## 2020-10-04 DIAGNOSIS — J019 Acute sinusitis, unspecified: Secondary | ICD-10-CM | POA: Insufficient documentation

## 2020-10-04 MED ORDER — AMOXICILLIN-POT CLAVULANATE 875-125 MG PO TABS: 1.0000 | ORAL_TABLET | Freq: Two times a day (BID) | ORAL | 0 refills | Status: DC

## 2020-10-04 MED ORDER — FLUTICASONE PROPIONATE 50 MCG/ACT NA SUSP: 2.0000 | Freq: Every day | NASAL | 6 refills | Status: DC

## 2020-10-04 NOTE — Progress Notes (Signed)
   Virtual Visit  Note Due to COVID-19 pandemic this visit was conducted virtually. This visit type was conducted due to national recommendations for restrictions regarding the COVID-19 Pandemic (e.g. social distancing, sheltering in place) in an effort to limit this patient's exposure and mitigate transmission in our community. All issues noted in this document were discussed and addressed.  A physical exam was not performed with this format.  I connected with Meredith Anderson on 10/04/20 at  8:56 AM by telephone and verified that I am speaking with the correct person using two identifiers. Meredith Anderson is currently located at home during visit. The provider, Ivy Lynn, NP is located in their office at time of visit.  I discussed the limitations, risks, security and privacy concerns of performing an evaluation and management service by telephone and the availability of in person appointments. I also discussed with the patient that there may be a patient responsible charge related to this service. The patient expressed understanding and agreed to proceed.   History and Present Illness:  Sinusitis This is a new problem. Episode onset: In the last 3 days. The problem has been gradually worsening since onset. There has been no fever. The pain is moderate. Associated symptoms include chills and ear pain. Pertinent negatives include no congestion or sore throat. Past treatments include oral decongestants. The treatment provided no relief.      Review of Systems  Constitutional: Positive for chills.  HENT: Positive for ear pain. Negative for congestion, sinus pain and sore throat.   Respiratory: Negative.   Cardiovascular: Negative.   Gastrointestinal: Negative.   Genitourinary: Negative.      Observations/Objective: Televisit-patient did not sound to be in distress.  Assessment and Plan:  Acute sinusitis Acute sinusitis symptoms not well controlled.  Patient is reporting cold, ear pain  and worsening seasonal allergies.  Patient denies fever, head congestion and cough. Education provided to patient.  Increase hydration, Tylenol for headache, Augmentin and Flonase ordered Rx sent to pharmacy. Follow-up with worsening unresolved symptoms.  Follow Up Instructions: Follow-up with worsening unresolved symptoms.    I discussed the assessment and treatment plan with the patient. The patient was provided an opportunity to ask questions and all were answered. The patient agreed with the plan and demonstrated an understanding of the instructions.   The patient was advised to call back or seek an in-person evaluation if the symptoms worsen or if the condition fails to improve as anticipated.  The above assessment and management plan was discussed with the patient. The patient verbalized understanding of and has agreed to the management plan. Patient is aware to call the clinic if symptoms persist or worsen. Patient is aware when to return to the clinic for a follow-up visit. Patient educated on when it is appropriate to go to the emergency department.   Time call ended: 9:04 AM  I provided 8 minutes of non-face-to-face time during this encounter.    Ivy Lynn, NP

## 2020-10-04 NOTE — Patient Instructions (Signed)

## 2020-10-04 NOTE — Assessment & Plan Note (Signed)
Acute sinusitis symptoms not well controlled.  Patient is reporting cold, ear pain and worsening seasonal allergies.  Patient denies fever, head congestion and cough. Education provided to patient.  Increase hydration, Tylenol for headache, Augmentin and Flonase ordered Rx sent to pharmacy. Follow-up with worsening unresolved symptoms.

## 2020-10-07 ENCOUNTER — Other Ambulatory Visit: Payer: Self-pay

## 2020-10-07 ENCOUNTER — Ambulatory Visit (INDEPENDENT_AMBULATORY_CARE_PROVIDER_SITE_OTHER): Payer: Medicare Other | Admitting: Family Medicine

## 2020-10-07 ENCOUNTER — Encounter: Payer: Self-pay | Admitting: Family Medicine

## 2020-10-07 VITALS — BP 117/72 | HR 83 | Temp 98.0°F | Resp 20 | Ht 64.0 in | Wt 167.0 lb

## 2020-10-07 DIAGNOSIS — R6889 Other general symptoms and signs: Secondary | ICD-10-CM | POA: Diagnosis not present

## 2020-10-07 DIAGNOSIS — I152 Hypertension secondary to endocrine disorders: Secondary | ICD-10-CM | POA: Diagnosis not present

## 2020-10-07 DIAGNOSIS — E1159 Type 2 diabetes mellitus with other circulatory complications: Secondary | ICD-10-CM | POA: Diagnosis not present

## 2020-10-07 DIAGNOSIS — B351 Tinea unguium: Secondary | ICD-10-CM | POA: Diagnosis not present

## 2020-10-07 DIAGNOSIS — E1169 Type 2 diabetes mellitus with other specified complication: Secondary | ICD-10-CM | POA: Diagnosis not present

## 2020-10-07 DIAGNOSIS — E785 Hyperlipidemia, unspecified: Secondary | ICD-10-CM

## 2020-10-07 LAB — BAYER DCA HB A1C WAIVED: HB A1C (BAYER DCA - WAIVED): 9.2 % — ABNORMAL HIGH (ref <7.0)

## 2020-10-07 MED ORDER — JUBLIA 10 % EX SOLN
CUTANEOUS | 99 refills | Status: DC
Start: 2020-10-07 — End: 2021-06-30

## 2020-10-07 NOTE — Addendum Note (Signed)
Addended by: Zannie Cove on: 10/07/2020 02:17 PM   Modules accepted: Orders

## 2020-10-07 NOTE — Progress Notes (Signed)
Subjective: CC: DM PCP: Meredith Norlander, DO QAS:[name] Meredith Anderson is a 80 y.o. female presenting to clinic today for:  1. Type 2 Diabetes with hypertension, hyperlipidemia; hypothyroidism:  She reports compliance with Januvia.  She did get treated with prednisone recently for her back and blood sugars have been running in the 160s.  She notes some cold intolerance and would like to get her hemoglobin checked.  Last eye exam: Done Last foot exam: Up-to-date Last A1c:  Lab Results  Component Value Date   HGBA1C 6.4 06/08/2020   Nephropathy screen indicated?:  Up-to-date Last flu, zoster and/or pneumovax:  Immunization History  Administered Date(s) Administered  . Fluad Quad(high Dose 65+) 03/25/2019, 04/27/2020  . Influenza Whole 04/01/2012  . Influenza, High Dose Seasonal PF 04/17/2017, 04/16/2018  . Influenza,inj,Quad PF,6+ Mos 04/03/2013, 04/03/2016  . Influenza-Unspecified 03/15/2014  . Moderna Sars-Covid-2 Vaccination 07/21/2019, 08/18/2019  . Pneumococcal Conjugate-13 05/17/2014  . Pneumococcal Polysaccharide-23 08/02/2009  . Tdap 01/31/2012  . Zoster 01/31/2012    ROS: No chest pain, shortness of breath.   ROS: Per HPI  Allergies  Allergen Reactions  . Dofetilide Other (See Comments)    Passed out  . Doxycycline     diarrhea   . Amlodipine     Other reaction(s): Other (See Comments) Swollen ankles  . Dronedarone Other (See Comments)    Increased BG  . Glipizide-Metformin Hcl     Other reaction(s): Diarrhea, Other (See Comments) Weakness  . Metformin     Other reaction(s): Diarrhea  . Niaspan [Niacin] Rash   Past Medical History:  Diagnosis Date  . A-fib ([place])   . Allergy    seasonal  . Anxiety   . Arthritis   . 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 Cataract   . Depression 12/12/2015  . Dyshydrosis   . Hemorrhoids   . Hyperlipidemia   . Hypertension   . Menopause   . NIDDM (non-insulin dependent diabetes mellitus)    diet  controlled   . Osteopenia 2016  . Thyroid disease     Current Outpatient Medications:  .  acetaminophen (TYLENOL) 500 MG tablet, Take by mouth., Disp: , Rfl:  .  amoxicillin-clavulanate (AUGMENTIN) 875-125 MG tablet, Take 1 tablet by mouth 2 (two) times daily., Disp: 14 tablet, Rfl: 0 .  azelastine (ASTELIN) 0.1 % nasal spray, Place 1 spray into both nostrils 2 (two) times daily., Disp: 30 mL, Rfl: 12 .  cholecalciferol (VITAMIN D) 1000 UNITS tablet, Take 1,000 Units by mouth daily., Disp: , Rfl:  .  diclofenac Sodium (VOLTAREN) 1 % GEL, Apply 2 g topically 4 (four) times daily. (prn joint pain), Disp: 400 g, Rfl: 3 .  diltiazem (CARDIZEM CD) 120 MG 24 hr capsule, TAKE 1 CAPSULE BY MOUTH EVERY DAY, Disp: 90 capsule, Rfl: 0 .  escitalopram (LEXAPRO) 10 MG tablet, Take 0.5 tablets (5 mg total) by mouth daily., Disp: 45 tablet, Rfl: 3 .  fluticasone (FLONASE) 50 MCG/ACT nasal spray, Place 2 sprays into both nostrils daily., Disp: 16 g, Rfl: 6 .  furosemide (LASIX) 40 MG tablet, Take 1 tablet (40 mg total) by mouth daily., Disp: 90 tablet, Rfl: 3 .  gabapentin (NEURONTIN) 100 MG capsule, Take 2 capsules (200 mg total) by mouth 3 (three) times daily., Disp: 180 capsule, Rfl: 1 .  glucose blood (ACCU-CHEK AVIVA PLUS) test strip, CHECK BLOOD SUGAR ONCE A DAY OR AS INSTRUCTED Dx E11.9, Disp: 100 strip, Rfl: 3 .  hydrocortisone (PROCTOSOL HC) 2.5 % rectal cream, Place 1 application rectally 2 (two) times daily. As needed for hemorrhoid flares. Max use is 7 consecutive days, Disp: 30 g, Rfl: 0 .  JANUVIA 100 MG tablet, TAKE 1 TABLET BY MOUTH EVERY DAY, Disp: 90 tablet, Rfl: 0 .  levothyroxine (SYNTHROID) 125 MCG tablet, Take 1 tablet (125 mcg total) by mouth daily before breakfast., Disp: 90 tablet, Rfl: 3 .  lidocaine (LIDODERM) 5 %, PLACE ONE PATCH ONTO THE SKIN EVERY 12HRS. REMOVE & DISCARD PATCH WITHIN 12HRS OR AS DIRECTED BY MD, Disp: 10 patch, Rfl: 5 .  lidocaine (XYLOCAINE) 2 % jelly, Apply 1  application topically 3 (three) times daily. Apply to hemorrhoid when painful, Disp: 30 mL, Rfl: 1 .  magnesium oxide (MAG-OX) 400 MG tablet, 400 mg., Disp: , Rfl:  .  Potassium Chloride ER 20 MEQ TBCR, TAKE 1 TABLET BY MOUTH EVERY DAY, Disp: 90 tablet, Rfl: 1 .  PREMARIN vaginal cream, USING APPLICATOR PLACE 1 GRAM IN VAGINA EVERY OTHER NIGHT AS INSTRUCTED, Disp: 30 g, Rfl: 3 .  Rivaroxaban (XARELTO) 15 MG TABS tablet, Take 1 tablet (15 mg total) by mouth daily with supper., Disp: 90 tablet, Rfl: 3 .  rosuvastatin (CRESTOR) 20 MG tablet, TAKE 1 TABLET BY MOUTH EVERY DAY, Disp: 90 tablet, Rfl: 0 .  TETRAHYDROZOLINE HCL OP, Apply to eye. Reported on 01/02/2016, Disp: , Rfl:  .  tiZANidine (ZANAFLEX) 4 MG tablet, Take 4 mg by mouth at bedtime., Disp: , Rfl:  Social History   Socioeconomic History  . Marital status: Divorced    Spouse name: Not on file  . Number of children: 2  . Years of education: 40  . Highest education level: High school graduate  Occupational History  . Occupation: Retired  Tobacco Use  . Smoking status: Never Smoker  . Smokeless tobacco: Never Used  Vaping Use  . Vaping Use: Never used  Substance and Sexual Activity  . Alcohol use: No  . Drug use: No  . Sexual activity: Not Currently  Other Topics Concern  . Not on file  Social History Narrative  . Not on file   Social Determinants of Health   Financial Resource Strain: Low Risk   . Difficulty of Paying Living Expenses: Not hard at all  Food Insecurity: No Food Insecurity  . Worried About Charity fundraiser in the Last Year: Never true  . Ran Out of Food in the Last Year: Never true  Transportation Needs: No Transportation Needs  . Lack of Transportation (Medical): No  . Lack of Transportation (Non-Medical): No  Physical Activity: Inactive  . Days of Exercise per Week: 0 days  . Minutes of Exercise per Session: 0 min  Stress: No Stress Concern Present  . Feeling of Stress : Not at all  Social  Connections: Socially Isolated  . Frequency of Communication with Friends and Family: More than three times a week  . Frequency of Social Gatherings with Friends and Family: More than three times a week  . Attends Religious Services: Never  . Active Member of Clubs or Organizations: No  . Attends Archivist Meetings: Never  . Marital Status: Divorced  Human resources officer Violence: Not At Risk  . Fear of Current or Ex-Partner: No  . Emotionally Abused: No  . Physically Abused: No  . Sexually Abused: No   Family History  Problem Relation Age of Onset  . Stroke Mother   . Diabetes Mother   .  Heart disease Father   . Atrial fibrillation Father   . Cancer Sister        ovarian / colon  . Diabetes Sister   . Heart disease Brother   . Hyperlipidemia Brother   . Hypertension Brother   . Diabetes Brother   . Heart attack Brother 81       had to perform emergency surgery  . Dementia Brother   . Hyperlipidemia Sister   . Cancer Sister        liver  . Hyperlipidemia Sister   . Diabetes Sister        Boarderline DM  . Cancer Sister   . Diabetes Sister   . Diabetes Daughter   . Rheum arthritis Daughter   . Psoriasis Daughter     Objective: Office vital signs reviewed. BP 117/72   Pulse 83   Temp 98 F (36.7 C)   Resp 20   Ht 5\' 4"  (1.626 m)   Wt 167 lb (75.8 kg)   SpO2 98%   BMI 28.67 kg/m   Physical Examination:  General: Awake, alert, well nourished, No acute distress HEENT: Normal; nasal turbinates with clear rhinorrhea.  She has a hemostatic bleed noted along the nasal septum on the right.  No exophthalmos.  No goiter Cardio: regular rate and rhythm, S1S2 heard, no murmurs appreciated Pulm: clear to auscultation bilaterally, no wheezes, rhonchi or rales; normal work of breathing on room air Extremities: warm, well perfused, No edema, cyanosis or clubbing; +2 pulses bilaterally MSK: Normal gait and station Skin: Nails with some mild onychomycotic changes  distally.  Assessment/ Plan: 80 y.o. female   Type 2 diabetes mellitus with other specified complication, without long-term current use of insulin ([place]) - Plan: Bayer DCA Hb A1c Waived  Hyperlipidemia associated with type 2 diabetes mellitus ([place])  Hypertension associated with diabetes ([place])  Onychomycosis of toenail - Plan: Efinaconazole (JUBLIA) 10 % SOLN  Cold intolerance - Plan: Hemoglobin, fingerstick, T4, Free, TSH  Awaiting A1c result.  Doubt we will change her medication given recent use of prednisone and less markedly changed from previous  Continue statin  Blood pressure well controlled.  Continue current regimen  Jublia prescribed given nail fungal infection.  Discussed this may take several months before it resolves.  Patient has a history of hypothyroidism.  Will check hemoglobin and thyroid levels given cold intolerance  No orders of the defined types were placed in this encounter.  No orders of the defined types were placed in this encounter.    Meredith Norlander, DO [place] [phone]

## 2020-10-12 ENCOUNTER — Other Ambulatory Visit: Payer: Medicare Other

## 2020-10-12 DIAGNOSIS — R6889 Other general symptoms and signs: Secondary | ICD-10-CM

## 2020-10-12 LAB — HEMOGLOBIN, FINGERSTICK: Hemoglobin: 11.7 g/dL (ref 11.1–15.9)

## 2020-10-13 LAB — TSH: TSH: 2.89 u[IU]/mL (ref 0.450–4.500)

## 2020-10-13 LAB — T4, FREE: Free T4: 1.43 ng/dL (ref 0.82–1.77)

## 2020-10-28 ENCOUNTER — Other Ambulatory Visit: Payer: Self-pay

## 2020-10-28 ENCOUNTER — Encounter: Payer: Self-pay | Admitting: Family Medicine

## 2020-10-28 ENCOUNTER — Ambulatory Visit (INDEPENDENT_AMBULATORY_CARE_PROVIDER_SITE_OTHER): Payer: Medicare Other | Admitting: Family Medicine

## 2020-10-28 VITALS — BP 164/79 | HR 81 | Temp 97.0°F | Ht 64.0 in | Wt 167.5 lb

## 2020-10-28 DIAGNOSIS — S6992XA Unspecified injury of left wrist, hand and finger(s), initial encounter: Secondary | ICD-10-CM

## 2020-10-28 NOTE — Progress Notes (Signed)
Acute Office Visit  Subjective:    Patient ID: Meredith Anderson, female    DOB: [birth year], 80 y.o.   MRN: [id number]  Chief Complaint  Patient presents with  . Finger Injury    HPI Patient is in today for an injury of her left ring finger. She smashed the top of her finger last night on a truck bed. She denies decreased range of motion. She area is achy. She has taken tylenol with some improvement. It is also bruised. She has a small laceration to the finger pad. She had a bandage this over night. When she took the bandage off this morning to change it, she noticed that the laceration was still oozing blood. She is concerned because she is on Xarelto. She denies dizziness or lightheadedness.   Past Medical History:  Diagnosis Date  . A-fib ([place])   . Allergy    seasonal  . Anxiety   . Arthritis   . 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 Cataract   . Depression 12/12/2015  . Dyshydrosis   . Hemorrhoids   . Hyperlipidemia   . Hypertension   . Menopause   . NIDDM (non-insulin dependent diabetes mellitus)    diet controlled   . Osteopenia 2016  . Thyroid disease     Past Surgical History:  Procedure Laterality Date  . ABDOMINAL HYSTERECTOMY     partial, prolapse  . ABLATION    . bunionectomy bilateral    . EYE SURGERY Bilateral    cataracts  . LUMBAR DISC SURGERY     L4 and L5  . PACEMAKER INSERTION  12/14/2015  . ROTATOR CUFF REPAIR Right   . TUBAL LIGATION      Family History  Problem Relation Age of Onset  . Stroke Mother   . Diabetes Mother   . Heart disease Father   . Atrial fibrillation Father   . Cancer Sister        ovarian / colon  . Diabetes Sister   . Heart disease Brother   . Hyperlipidemia Brother   . Hypertension Brother   . Diabetes Brother   . Heart attack Brother 9       had to perform emergency surgery  . Dementia Brother   . Hyperlipidemia Sister   . Cancer Sister        liver  . Hyperlipidemia Sister   . Diabetes Sister         [place] DM  . Cancer Sister   . Diabetes Sister   . Diabetes Daughter   . Rheum arthritis Daughter   . Psoriasis Daughter     Social History   Socioeconomic History  . Marital status: Divorced    Spouse name: Not on file  . Number of children: 2  . Years of education: 10  . Highest education level: High school graduate  Occupational History  . Occupation: Retired  Tobacco Use  . Smoking status: Never Smoker  . Smokeless tobacco: Never Used  Vaping Use  . Vaping Use: Never used  Substance and Sexual Activity  . Alcohol use: No  . Drug use: No  . Sexual activity: Not Currently  Other Topics Concern  . Not on file  Social History Narrative  . Not on file   Social Determinants of Health   Financial Resource Strain: Low Risk   . Difficulty of Paying Living Expenses: Not hard at all  Food Insecurity: No Food Insecurity  .  Worried About Charity fundraiser in the Last Year: Never true  . Ran Out of Food in the Last Year: Never true  Transportation Needs: No Transportation Needs  . Lack of Transportation (Medical): No  . Lack of Transportation (Non-Medical): No  Physical Activity: Inactive  . Days of Exercise per Week: 0 days  . Minutes of Exercise per Session: 0 min  Stress: No Stress Concern Present  . Feeling of Stress : Not at all  Social Connections: Socially Isolated  . Frequency of Communication with Friends and Family: More than three times a week  . Frequency of Social Gatherings with Friends and Family: More than three times a week  . Attends Religious Services: Never  . Active Member of Clubs or Organizations: No  . Attends Archivist Meetings: Never  . Marital Status: Divorced  Human resources officer Violence: Not At Risk  . Fear of Current or Ex-Partner: No  . Emotionally Abused: No  . Physically Abused: No  . Sexually Abused: No    Outpatient Medications Prior to Visit  Medication Sig Dispense Refill  . acetaminophen (TYLENOL) 500 MG  tablet Take by mouth.    Marland Kitchen azelastine (ASTELIN) 0.1 % nasal spray Place 1 spray into both nostrils 2 (two) times daily. 30 mL 12  . cholecalciferol (VITAMIN D) 1000 UNITS tablet Take 1,000 Units by mouth daily.    . diclofenac Sodium (VOLTAREN) 1 % GEL Apply 2 g topically 4 (four) times daily. (prn joint pain) 400 g 3  . diltiazem (CARDIZEM CD) 120 MG 24 hr capsule TAKE 1 CAPSULE BY MOUTH EVERY DAY 90 capsule 0  . Efinaconazole (JUBLIA) 10 % SOLN Apply to affected nails once daily for 48 weeks. 8 mL prn  . fluticasone (FLONASE) 50 MCG/ACT nasal spray Place 2 sprays into both nostrils daily. 16 g 6  . furosemide (LASIX) 40 MG tablet Take 1 tablet (40 mg total) by mouth daily. 90 tablet 3  . gabapentin (NEURONTIN) 100 MG capsule Take 2 capsules (200 mg total) by mouth 3 (three) times daily. 180 capsule 1  . glucose blood (ACCU-CHEK AVIVA PLUS) test strip CHECK BLOOD SUGAR ONCE A DAY OR AS INSTRUCTED Dx E11.9 100 strip 3  . hydrocortisone (PROCTOSOL HC) 2.5 % rectal cream Place 1 application rectally 2 (two) times daily. As needed for hemorrhoid flares. Max use is 7 consecutive days 30 g 0  . JANUVIA 100 MG tablet TAKE 1 TABLET BY MOUTH EVERY DAY 90 tablet 0  . levothyroxine (SYNTHROID) 125 MCG tablet Take 1 tablet (125 mcg total) by mouth daily before breakfast. 90 tablet 3  . lidocaine (XYLOCAINE) 2 % jelly Apply 1 application topically 3 (three) times daily. Apply to hemorrhoid when painful 30 mL 1  . magnesium oxide (MAG-OX) 400 MG tablet 400 mg.    . Potassium Chloride ER 20 MEQ TBCR TAKE 1 TABLET BY MOUTH EVERY DAY 90 tablet 1  . PREMARIN vaginal cream USING APPLICATOR PLACE 1 GRAM IN VAGINA EVERY OTHER NIGHT AS INSTRUCTED 30 g 3  . Rivaroxaban (XARELTO) 15 MG TABS tablet Take 1 tablet (15 mg total) by mouth daily with supper. 90 tablet 3  . rosuvastatin (CRESTOR) 20 MG tablet TAKE 1 TABLET BY MOUTH EVERY DAY 90 tablet 0  . TETRAHYDROZOLINE HCL OP Apply to eye. Reported on 01/02/2016    .  amoxicillin-clavulanate (AUGMENTIN) 875-125 MG tablet Take 1 tablet by mouth 2 (two) times daily. 14 tablet 0  . lidocaine (LIDODERM) 5 %  PLACE ONE PATCH ONTO THE SKIN EVERY 12HRS. REMOVE & DISCARD PATCH WITHIN 12HRS OR AS DIRECTED BY MD 10 patch 5  . tiZANidine (ZANAFLEX) 4 MG tablet Take 4 mg by mouth at bedtime.     No facility-administered medications prior to visit.    Allergies  Allergen Reactions  . Dofetilide Other (See Comments)    Passed out  . Doxycycline     diarrhea   . Amlodipine     Other reaction(s): Other (See Comments) Swollen ankles  . Dronedarone Other (See Comments)    Increased BG  . Glipizide-Metformin Hcl     Other reaction(s): Diarrhea, Other (See Comments) Weakness  . Metformin     Other reaction(s): Diarrhea  . Niaspan [Niacin] Rash    Review of Systems As per HPI.     Objective:     Physical Exam Vitals and nursing note reviewed.  Constitutional:      General: She is not in acute distress.    Appearance: She is not ill-appearing, toxic-appearing or diaphoretic.  Skin:    General: Skin is warm and dry.     Findings: Bruising, signs of injury and laceration present.     Comments: Injury to left ring finger with bruising and swelling from tip of finger to DIP joint. 1 cm superficial laceration to finger pad with oozing of blood. No purulent drainage. No warmth or erythema. Full ROM. No bony tenderness. Sensation intact.   Neurological:     General: No focal deficit present.     Mental Status: She is alert and oriented to person, place, and time.  Psychiatric:        Mood and Affect: Mood normal.        Behavior: Behavior normal.    Laceration repair  Date/Time: 10/28/2020 11:32 AM Performed by: Gwenlyn Perking, FNP Authorized by: Gwenlyn Perking, FNP   Consent:    Consent obtained:  Verbal   Consent given by:  Patient   Risks discussed:  Pain and need for additional repair   Alternatives discussed:  Observation Anesthesia (see  MAR for exact dosages):    Anesthesia method:  None Laceration details:    Location:  Finger   Finger location:  L ring finger   Length (cm):  1   Laceration depth: superficial. Repair type:    Repair type:  Simple Treatment:    Area cleansed with:  Saline   Amount of cleaning:  Standard   Visualized foreign bodies/material removed: no   Skin repair:    Repair method:  Steri-Strips Approximation:    Approximation:  Loose Post-procedure details:    Dressing: tegaderm.   Patient tolerance of procedure:  Tolerated well, no immediate complications   BP (!) 101/75   Pulse 81   Temp (!) 97 F (36.1 C) (Temporal)   Ht 5\' 4"  (1.626 m)   Wt 167 lb 8 oz (76 kg)   BMI 28.75 kg/m  Wt Readings from Last 3 Encounters:  10/28/20 167 lb 8 oz (76 kg)  10/07/20 167 lb (75.8 kg)  09/09/20 169 lb 4 oz (76.8 kg)    Health Maintenance Due  Topic Date Due  . DEXA SCAN  08/11/2016    There are no preventive care reminders to display for this patient.   Lab Results  Component Value Date   TSH 2.890 10/12/2020   Lab Results  Component Value Date   WBC 4.7 12/01/2019   HGB 12.7 12/01/2019   HCT 37.6 12/01/2019  MCV 90 12/01/2019   PLT 181 12/01/2019   Lab Results  Component Value Date   NA 140 06/08/2020   K 3.8 06/08/2020   CO2 25 06/08/2020   GLUCOSE 161 (H) 06/08/2020   BUN 11 06/08/2020   CREATININE 0.88 06/08/2020   BILITOT 0.7 12/01/2019   ALKPHOS 100 12/01/2019   AST 18 12/01/2019   ALT 12 12/01/2019   PROT 7.7 12/01/2019   ALBUMIN 4.9 (H) 12/01/2019   CALCIUM 9.8 06/08/2020   Lab Results  Component Value Date   CHOL 97 (L) 12/01/2019   Lab Results  Component Value Date   HDL 31 (L) 12/01/2019   Lab Results  Component Value Date   LDLCALC 40 12/01/2019   Lab Results  Component Value Date   TRIG 150 (H) 12/01/2019   Lab Results  Component Value Date   CHOLHDL 3.1 12/01/2019   Lab Results  Component Value Date   HGBA1C 9.2 (H) 10/07/2020        Assessment & Plan:   Meredith Anderson was seen today for finger injury.  Diagnoses and all orders for this visit:  Injury of finger of left hand, initial encounter Bruising and swelling with superficial laceration to finger pad of left ring finger. Laceration cleansed. Steri-strips applied. Bleeding stopped. Tegaderm dressing applied. Home care discussed, handout given. Return to office for new or worsening symptoms, or if symptoms persist.  -     Laceration repair  The patient indicates understanding of these issues and agrees with the plan.  Gwenlyn Perking, FNP

## 2020-10-28 NOTE — Patient Instructions (Signed)
Laceration Care, Adult A laceration is a cut that may go through all layers of the skin and into the tissue that is right under the skin. Some lacerations heal on their own. Others need to be closed with stitches (sutures), staples, skin adhesive strips, or skin glue. Proper care of a laceration reduces the risk for infection, helps the laceration heal better, and may prevent scarring. How to care for your laceration Wash your hands with soap and water before touching your wound or changing your bandage (dressing). If soap and water are not available, use hand sanitizer. Keep the wound clean and dry. If you were given a dressing, you should change it at least once a day, or as told by your health care provider. You should also change it if it becomes wet or dirty. If sutures or staples were used:  Keep the wound completely dry for the first 24 hours, or as told by your health care provider. After that time, you may shower or bathe. However, make sure that the wound is not soaked in water until after the sutures or staples have been removed.  Clean the wound once each day, or as told by your health care provider: ? Wash the wound with soap and water. ? Rinse the wound with water to remove all soap. ? Pat the wound dry with a clean towel. Do not rub the wound.  After cleaning the wound, apply a thin layer of antibiotic ointment as told by your health care provider. This will help prevent infection and keep the dressing from sticking to the wound.  Have the sutures or staples removed as told by your health care provider. If skin adhesive strips were used:  Do not get the skin adhesive strips wet. You may shower or bathe, but be careful to keep the wound dry.  If the wound gets wet, pat it dry with a clean towel. Do not rub the wound.  Skin adhesive strips fall off on their own. You may trim the strips as the wound heals. Do not remove skin adhesive strips that are still stuck to the wound. They  will fall off in time. If skin glue was used:  Try to keep the wound dry, but you may briefly wet it in the shower or bath. Do not soak the wound in water, such as by swimming.  After you have showered or bathed, gently pat the wound dry with a clean towel. Do not rub the wound.  Do not do any activities that will make you sweat heavily until the skin glue has fallen off on its own.  Do not apply liquid, cream, or ointment medicine to the wound while the skin glue is in place. Using those may loosen the film before the wound has healed.  If a dressing is placed over the wound, be careful not to apply tape directly over the skin glue. Doing that may cause the glue to be pulled off before the wound has healed.  Do not pick at the glue. Skin glue usually remains in place for 5-10 days and then falls off the skin. General instructions  Take over-the-counter and prescription medicines only as told by your health care provider.  If you were prescribed an antibiotic medicine or ointment, take or apply it as told by your health care provider. Do not stop using it even if your condition improves.  Do not scratch or pick at the wound.  Check your wound every day for signs of infection.  Watch for: ? Redness, swelling, or pain. ? Fluid, blood, or pus.  Raise (elevate) the injured area above the level of your heart while you are sitting or lying down for the first 24-48 hours after the laceration is repaired.  If directed, put ice on the affected area: ? Put ice in a plastic bag. ? Place a towel between your skin and the bag. ? Leave the ice on for 20 minutes, 2-3 times a day.  Keep all follow-up visits as told by your health care provider. This is important.   Contact a health care provider if:  You received a tetanus shot and you have swelling, severe pain, redness, or bleeding at the injection site.  You have a fever.  A wound that was closed breaks open.  You notice a bad smell  coming from your wound or your dressing.  You notice something coming out of the wound, such as wood or glass.  Your pain is not controlled with medicine.  You have increased redness, swelling, or pain at the site of your wound.  You have fluid, blood, or pus coming from your wound.  You need to change the dressing often due to fluid, blood, or pus that is draining from the wound.  You develop a new rash.  You develop numbness around the wound. Get help right away if:  You develop severe swelling around the wound.  Your pain suddenly increases and is severe.  You develop painful lumps near the wound or on skin anywhere else on your body.  You have a red streak going away from your wound.  The wound is on your hand or foot and you cannot properly move a finger or toe.  The wound is on your hand or foot, and you notice that your fingers or toes look pale or bluish. Summary  A laceration is a cut that may go through all layers of the skin and into the tissue that is right under the skin.  Some lacerations heal on their own. Others need to be closed with stitches (sutures), staples, skin adhesive strips, or skin glue.  Proper care of a laceration reduces the risk of infection, helps the laceration heal better, and prevents scarring. This information is not intended to replace advice given to you by your health care provider. Make sure you discuss any questions you have with your health care provider. Document Revised: 08/16/2017 Document Reviewed: 07/08/2017 Elsevier Patient Education  2021 [employer].

## 2020-11-01 ENCOUNTER — Ambulatory Visit (INDEPENDENT_AMBULATORY_CARE_PROVIDER_SITE_OTHER): Payer: Medicare Other

## 2020-11-01 VITALS — Ht 64.0 in | Wt 168.0 lb

## 2020-11-01 DIAGNOSIS — Z0000 Encounter for general adult medical examination without abnormal findings: Secondary | ICD-10-CM | POA: Diagnosis not present

## 2020-11-01 NOTE — Patient Instructions (Signed)
Meredith Anderson , Thank you for taking time to come for your Medicare Wellness Visit. I appreciate your ongoing commitment to your health goals. Please review the following plan we discussed and let me know if I can assist you in the future.   Screening recommendations/referrals: Colonoscopy: no longer required Mammogram: Done 01/27/2020 - Repeat every year Bone Density: Done 08/11/2014 - no longer required Recommended yearly ophthalmology/optometry visit for glaucoma screening and checkup Recommended yearly dental visit for hygiene and checkup  Vaccinations: Influenza vaccine: Done 04/27/2020 - repeat every year Pneumococcal vaccine: Done 08/02/2009 & 05/17/2014 Tdap vaccine: Done 01/31/2012 - Repeat every 10 years Shingles vaccine: zostavax done 2013 - *DUE for Shingrix discussed. Please contact your pharmacy for coverage information.    Covid-19: Done 07/21/19, 08/18/19, & 06/16/2020  Advanced directives: Advance directive discussed with you today. Even though you declined this today, please call our office should you change your mind, and we can give you the proper paperwork for you to fill out.  Conditions/risks identified: Aim for 30 minutes of exercise or brisk walking each day, drink 6-8 glasses of water and eat lots of fruits and vegetables.  Next appointment: Follow up in one year for your annual wellness visit    Preventive Care 65 Years and Older, Female Preventive care refers to lifestyle choices and visits with your health care provider that can promote health and wellness. What does preventive care include?  A yearly physical exam. This is also called an annual well check.  Dental exams once or twice a year.  Routine eye exams. Ask your health care provider how often you should have your eyes checked.  Personal lifestyle choices, including:  Daily care of your teeth and gums.  Regular physical activity.  Eating a healthy diet.  Avoiding tobacco and drug use.  Limiting  alcohol use.  Practicing safe sex.  Taking low-dose aspirin every day.  Taking vitamin and mineral supplements as recommended by your health care provider. What happens during an annual well check? The services and screenings done by your health care provider during your annual well check will depend on your age, overall health, lifestyle risk factors, and family history of disease. Counseling  Your health care provider may ask you questions about your:  Alcohol use.  Tobacco use.  Drug use.  Emotional well-being.  Home and relationship well-being.  Sexual activity.  Eating habits.  History of falls.  Memory and ability to understand (cognition).  Work and work Statistician.  Reproductive health. Screening  You may have the following tests or measurements:  Height, weight, and BMI.  Blood pressure.  Lipid and cholesterol levels. These may be checked every 5 years, or more frequently if you are over 71 years old.  Skin check.  Lung cancer screening. You may have this screening every year starting at age 40 if you have a 30-pack-year history of smoking and currently smoke or have quit within the past 15 years.  Fecal occult blood test (FOBT) of the stool. You may have this test every year starting at age 66.  Flexible sigmoidoscopy or colonoscopy. You may have a sigmoidoscopy every 5 years or a colonoscopy every 10 years starting at age 85.  Hepatitis C blood test.  Hepatitis B blood test.  Sexually transmitted disease (STD) testing.  Diabetes screening. This is done by checking your blood sugar (glucose) after you have not eaten for a while (fasting). You may have this done every 1-3 years.  Bone density scan. This is  done to screen for osteoporosis. You may have this done starting at age 51.  Mammogram. This may be done every 1-2 years. Talk to your health care provider about how often you should have regular mammograms. Talk with your health care provider  about your test results, treatment options, and if necessary, the need for more tests. Vaccines  Your health care provider may recommend certain vaccines, such as:  Influenza vaccine. This is recommended every year.  Tetanus, diphtheria, and acellular pertussis (Tdap, Td) vaccine. You may need a Td booster every 10 years.  Zoster vaccine. You may need this after age 30.  Pneumococcal 13-valent conjugate (PCV13) vaccine. One dose is recommended after age 15.  Pneumococcal polysaccharide (PPSV23) vaccine. One dose is recommended after age 64. Talk to your health care provider about which screenings and vaccines you need and how often you need them. This information is not intended to replace advice given to you by your health care provider. Make sure you discuss any questions you have with your health care provider. Document Released: 07/15/2015 Document Revised: 03/07/2016 Document Reviewed: 04/19/2015 Elsevier Interactive Patient Education  2017 [place] Prevention in the Home Falls can cause injuries. They can happen to people of all ages. There are many things you can do to make your home safe and to help prevent falls. What can I do on the outside of my home?  Regularly fix the edges of walkways and driveways and fix any cracks.  Remove anything that might make you trip as you walk through a door, such as a raised step or threshold.  Trim any bushes or trees on the path to your home.  Use bright outdoor lighting.  Clear any walking paths of anything that might make someone trip, such as rocks or tools.  Regularly check to see if handrails are loose or broken. Make sure that both sides of any steps have handrails.  Any raised decks and porches should have guardrails on the edges.  Have any leaves, snow, or ice cleared regularly.  Use sand or salt on walking paths during winter.  Clean up any spills in your garage right away. This includes oil or grease  spills. What can I do in the bathroom?  Use night lights.  Install grab bars by the toilet and in the tub and shower. Do not use towel bars as grab bars.  Use non-skid mats or decals in the tub or shower.  If you need to sit down in the shower, use a plastic, non-slip stool.  Keep the floor dry. Clean up any water that spills on the floor as soon as it happens.  Remove soap buildup in the tub or shower regularly.  Attach bath mats securely with double-sided non-slip rug tape.  Do not have throw rugs and other things on the floor that can make you trip. What can I do in the bedroom?  Use night lights.  Make sure that you have a light by your bed that is easy to reach.  Do not use any sheets or blankets that are too big for your bed. They should not hang down onto the floor.  Have a firm chair that has side arms. You can use this for support while you get dressed.  Do not have throw rugs and other things on the floor that can make you trip. What can I do in the kitchen?  Clean up any spills right away.  Avoid walking on wet floors.  Keep  items that you use a lot in easy-to-reach places.  If you need to reach something above you, use a strong step stool that has a grab bar.  Keep electrical cords out of the way.  Do not use floor polish or wax that makes floors slippery. If you must use wax, use non-skid floor wax.  Do not have throw rugs and other things on the floor that can make you trip. What can I do with my stairs?  Do not leave any items on the stairs.  Make sure that there are handrails on both sides of the stairs and use them. Fix handrails that are broken or loose. Make sure that handrails are as long as the stairways.  Check any carpeting to make sure that it is firmly attached to the stairs. Fix any carpet that is loose or worn.  Avoid having throw rugs at the top or bottom of the stairs. If you do have throw rugs, attach them to the floor with carpet  tape.  Make sure that you have a light switch at the top of the stairs and the bottom of the stairs. If you do not have them, ask someone to add them for you. What else can I do to help prevent falls?  Wear shoes that:  Do not have high heels.  Have rubber bottoms.  Are comfortable and fit you well.  Are closed at the toe. Do not wear sandals.  If you use a stepladder:  Make sure that it is fully opened. Do not climb a closed stepladder.  Make sure that both sides of the stepladder are locked into place.  Ask someone to hold it for you, if possible.  Clearly mark and make sure that you can see:  Any grab bars or handrails.  First and last steps.  Where the edge of each step is.  Use tools that help you move around (mobility aids) if they are needed. These include:  Canes.  Walkers.  Scooters.  Crutches.  Turn on the lights when you go into a dark area. Replace any light bulbs as soon as they burn out.  Set up your furniture so you have a clear path. Avoid moving your furniture around.  If any of your floors are uneven, fix them.  If there are any pets around you, be aware of where they are.  Review your medicines with your doctor. Some medicines can make you feel dizzy. This can increase your chance of falling. Ask your doctor what other things that you can do to help prevent falls. This information is not intended to replace advice given to you by your health care provider. Make sure you discuss any questions you have with your health care provider. Document Released: 04/14/2009 Document Revised: 11/24/2015 Document Reviewed: 07/23/2014 Elsevier Interactive Patient Education  2017 [employer].

## 2020-11-01 NOTE — Progress Notes (Signed)
Subjective:   Meredith Anderson is a 80 y.o. female who presents for Medicare Annual (Subsequent) preventive examination.  Virtual Visit via Telephone Note  I connected with  Meredith Anderson on 11/01/20 at 11:15 AM EDT by telephone and verified that I am speaking with the correct person using two identifiers.  Location: Patient: Home Provider: [facility] Persons participating in the virtual visit: patient/Nurse Health Advisor   I discussed the limitations, risks, security and privacy concerns of performing an evaluation and management service by telephone and the availability of in person appointments. The patient expressed understanding and agreed to proceed.  Interactive audio and video telecommunications were attempted between this nurse and patient, however failed, due to patient having technical difficulties OR patient did not have access to video capability.  We continued and completed visit with audio only.  Some vital signs may be absent or patient reported.   Meredith Anderson E Meredith Fulwider, LPN   Review of Systems     Cardiac Risk Factors include: advanced age (>63men, >89 women);diabetes mellitus;family history of premature cardiovascular disease;hypertension;sedentary lifestyle     Objective:    Today's Vitals   11/01/20 1050  Weight: 168 lb (76.2 kg)  Height: 5\' 4"  (1.626 m)   Body mass index is 28.84 kg/m.  Advanced Directives 11/01/2020 10/29/2019 10/28/2018 05/23/2016 05/20/2015 05/17/2014  Does Patient Have a Medical Advance Directive? No No No No No No  Would patient like information on creating a medical advance directive? No - Patient declined Yes (MAU/Ambulatory/Procedural Areas - Information given) No - Patient declined No - Patient declined No - patient declined information No - patient declined information    Current Medications (verified) Outpatient Encounter Medications as of 11/01/2020  Medication Sig  . acetaminophen (TYLENOL) 500 MG tablet Take by mouth.  Marland Kitchen azelastine  (ASTELIN) 0.1 % nasal spray Place 1 spray into both nostrils 2 (two) times daily.  . cholecalciferol (VITAMIN D) 1000 UNITS tablet Take 1,000 Units by mouth daily.  . diclofenac Sodium (VOLTAREN) 1 % GEL Apply 2 g topically 4 (four) times daily. (prn joint pain)  . diltiazem (CARDIZEM CD) 120 MG 24 hr capsule TAKE 1 CAPSULE BY MOUTH EVERY DAY  . Efinaconazole (JUBLIA) 10 % SOLN Apply to affected nails once daily for 48 weeks.  . fluticasone (FLONASE) 50 MCG/ACT nasal spray Place 2 sprays into both nostrils daily.  . furosemide (LASIX) 40 MG tablet Take 1 tablet (40 mg total) by mouth daily.  Marland Kitchen gabapentin (NEURONTIN) 100 MG capsule Take 2 capsules (200 mg total) by mouth 3 (three) times daily.  Marland Kitchen glucose blood (ACCU-CHEK AVIVA PLUS) test strip CHECK BLOOD SUGAR ONCE A DAY OR AS INSTRUCTED Dx E11.9  . hydrocortisone (PROCTOSOL HC) 2.5 % rectal cream Place 1 application rectally 2 (two) times daily. As needed for hemorrhoid flares. Max use is 7 consecutive days  . JANUVIA 100 MG tablet TAKE 1 TABLET BY MOUTH EVERY DAY  . levothyroxine (SYNTHROID) 125 MCG tablet Take 1 tablet (125 mcg total) by mouth daily before breakfast.  . lidocaine (XYLOCAINE) 2 % jelly Apply 1 application topically 3 (three) times daily. Apply to hemorrhoid when painful  . magnesium oxide (MAG-OX) 400 MG tablet 400 mg.  . Potassium Chloride ER 20 MEQ TBCR TAKE 1 TABLET BY MOUTH EVERY DAY  . PREMARIN vaginal cream USING APPLICATOR PLACE 1 GRAM IN VAGINA EVERY OTHER NIGHT AS INSTRUCTED  . Rivaroxaban (XARELTO) 15 MG TABS tablet Take 1 tablet (15 mg total) by mouth daily with  supper.  . rosuvastatin (CRESTOR) 20 MG tablet TAKE 1 TABLET BY MOUTH EVERY DAY  . TETRAHYDROZOLINE HCL OP Apply to eye. Reported on 01/02/2016   No facility-administered encounter medications on file as of 11/01/2020.    Allergies (verified) Dofetilide, Doxycycline, Amlodipine, Dronedarone, Glipizide-metformin hcl, Metformin, and Niaspan [niacin]    History: Past Medical History:  Diagnosis Date  . A-fib ([place])   . Allergy    seasonal  . Anxiety   . Arthritis   . 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 Cataract   . Depression 12/12/2015  . Dyshydrosis   . Hemorrhoids   . Hyperlipidemia   . Hypertension   . Menopause   . NIDDM (non-insulin dependent diabetes mellitus)    diet controlled   . Osteopenia 2016  . Thyroid disease    Past Surgical History:  Procedure Laterality Date  . ABDOMINAL HYSTERECTOMY     partial, prolapse  . ABLATION    . bunionectomy bilateral    . EYE SURGERY Bilateral    cataracts  . LUMBAR DISC SURGERY     L4 and L5  . PACEMAKER INSERTION  12/14/2015  . ROTATOR CUFF REPAIR Right   . TUBAL LIGATION     Family History  Problem Relation Age of Onset  . Stroke Mother   . Diabetes Mother   . Heart disease Father   . Atrial fibrillation Father   . Cancer Sister        ovarian / colon  . Diabetes Sister   . Heart disease Brother   . Hyperlipidemia Brother   . Hypertension Brother   . Diabetes Brother   . Heart attack Brother 25       had to perform emergency surgery  . Dementia Brother   . Hyperlipidemia Sister   . Cancer Sister        liver  . Hyperlipidemia Sister   . Diabetes Sister        [place] DM  . Cancer Sister   . Diabetes Sister   . Diabetes Daughter   . Rheum arthritis Daughter   . Psoriasis Daughter    Social History   Socioeconomic History  . Marital status: Divorced    Spouse name: Not on file  . Number of children: 2  . Years of education: 31  . Highest education level: High school graduate  Occupational History  . Occupation: Retired  Tobacco Use  . Smoking status: Never Smoker  . Smokeless tobacco: Never Used  Vaping Use  . Vaping Use: Never used  Substance and Sexual Activity  . Alcohol use: No  . Drug use: No  . Sexual activity: Not Currently  Other Topics Concern  . Not on file  Social History Narrative   Lives alone. Children  live nearby   Social Determinants of Health   Financial Resource Strain: Low Risk   . Difficulty of Paying Living Expenses: Not hard at all  Food Insecurity: No Food Insecurity  . Worried About Charity fundraiser in the Last Year: Never true  . Ran Out of Food in the Last Year: Never true  Transportation Needs: No Transportation Needs  . Lack of Transportation (Medical): No  . Lack of Transportation (Non-Medical): No  Physical Activity: Inactive  . Days of Exercise per Week: 0 days  . Minutes of Exercise per Session: 0 min  Stress: No Stress Concern Present  . Feeling of Stress : Not at all  Social  Connections: Socially Isolated  . Frequency of Communication with Friends and Family: More than three times a week  . Frequency of Social Gatherings with Friends and Family: More than three times a week  . Attends Religious Services: Never  . Active Member of Clubs or Organizations: No  . Attends Archivist Meetings: Never  . Marital Status: Divorced    Tobacco Counseling Counseling given: Not Answered   Clinical Intake:  Pre-visit preparation completed: Yes  Pain : No/denies pain     BMI - recorded: 28.84 Nutritional Status: BMI 25 -29 Overweight Nutritional Risks: None Diabetes: Yes CBG done?: No Did pt. bring in CBG monitor from home?: No  How often do you need to have someone help you when you read instructions, pamphlets, or other written materials from your doctor or pharmacy?: 1 - Never  Nutrition Risk Assessment:  Has the patient had any N/V/D within the last 2 months?  No  Does the patient have any non-healing wounds?  No  Has the patient had any unintentional weight loss or weight gain?  No   Diabetes:  Is the patient diabetic?  Yes  If diabetic, was a CBG obtained today?  No  Did the patient bring in their glucometer from home?  No  How often do you monitor your CBG's? BID - this morning 150 per patient.   Financial Strains and Diabetes  Management:  Are you having any financial strains with the device, your supplies or your medication? No .  Does the patient want to be seen by Chronic Care Management for management of their diabetes?  No  Would the patient like to be referred to a Nutritionist or for Diabetic Management?  No   Diabetic Exams:  Diabetic Eye Exam: Completed 08/2020.  Diabetic Foot Exam: Completed 12/01/2019. Pt has been advised about the importance in completing this exam. Pt is scheduled for diabetic foot exam on 02/06/2021.    Interpreter Needed?: No  Information entered by :: Myria Steenbergen, LPN   Activities of Daily Living In your present state of health, do you have any difficulty performing the following activities: 11/01/2020  Hearing? N  Vision? N  Difficulty concentrating or making decisions? N  Walking or climbing stairs? N  Dressing or bathing? N  Doing errands, shopping? N  Preparing Food and eating ? N  Using the Toilet? N  In the past six months, have you accidently leaked urine? Y  Do you have problems with loss of bowel control? N  Managing your Medications? N  Managing your Finances? N  Housekeeping or managing your Housekeeping? N  Some recent data might be hidden    Patient Care Team: Janora Norlander, DO as PCP - General (Family Medicine) Frederik Pear, MD as Consulting Physician (Internal Medicine) Wyvonnia Dusky, MD as Consulting Physician (Cardiology) Griselda Miner, MD as Consulting Physician (Dermatology) Lavera Guise, [facility] (Pharmacist)  Indicate any recent Medical Services you may have received from other than [facility] providers in the past year (date may be approximate).     Assessment:   This is a routine wellness examination for Meredith Anderson.  Hearing/Vision screen  Hearing Screening   125Hz  250Hz  500Hz  1000Hz  2000Hz  3000Hz  4000Hz  6000Hz  8000Hz   Right ear:           Left ear:           Comments: Denies hearing difficulties  Vision Screening Comments:  Denies vision difficulties - Annual eye exams with [facility] in [place] - up  to date with eye exams.  Dietary issues and exercise activities discussed: Current Exercise Habits: The patient does not participate in regular exercise at present, Exercise limited by: None identified  Goals Addressed            This Visit's Progress   . Exercise 3x per week (30 min per time)   Not on track    10/29/2019 AWV Goal: Exercise for General Health   Patient will verbalize understanding of the benefits of increased physical activity:  Exercising regularly is important. It will improve your overall fitness, flexibility, and endurance.  Regular exercise also will improve your overall health. It can help you control your weight, reduce stress, and improve your bone density.  Over the next year, patient will increase physical activity as tolerated with a goal of at least 150 minutes of moderate physical activity per week.   You can tell that you are exercising at a moderate intensity if your heart starts beating faster and you start breathing faster but can still hold a conversation.  Moderate-intensity exercise ideas include:  Walking 1 mile (1.6 km) in about 15 minutes  Biking  Hiking  Golfing  Dancing  Water aerobics  Patient will verbalize understanding of everyday activities that increase physical activity by providing examples like the following: ? Yard work, such as: ? Pushing a Conservation officer, nature ? Raking and bagging leaves ? Washing your car ? Pushing a stroller ? Shoveling snow ? Gardening ? Washing windows or floors  Patient will be able to explain general safety guidelines for exercising:   Before you start a new exercise program, talk with your health care provider.  Do not exercise so much that you hurt yourself, feel dizzy, or get very short of breath.  Wear comfortable clothes and wear shoes with good support.  Drink plenty of water while you exercise to prevent  dehydration or heat stroke.  Work out until your breathing and your heartbeat get faster.     . Have 3 meals a day   On track    Eat at least 3 meals daily that consist of proteins, fruits and vegetables Increase water intake+   10/29/2019 AWV Goal: Improved Nutrition/Diet  . Patient will verbalize understanding that diet plays an important role in overall health and that a poor diet is a risk factor for many chronic medical conditions.  . Over the next year, patient will improve self management of their diet by incorporating decreased fat intake, fewer sweetened foods & beverages, increased physical activity, improved protein intake, better food choices, and adequate fluid intake (at least 6 cups of fluid per day). . Patient will utilize available community resources to help with food acquisition if needed (ex: food pantries, [address], etc) . Patient will work with nutrition specialist if a referral was made        Depression Screen PHQ 2/9 Scores 11/01/2020 10/28/2020 10/07/2020 08/22/2020 06/08/2020 04/27/2020 03/02/2020  PHQ - 2 Score 0 0 0 0 0 0 0  PHQ- 9 Score - - - - - - 0    Fall Risk Fall Risk  11/01/2020 10/28/2020 10/07/2020 08/22/2020 06/08/2020  Falls in the past year? 0 0 0 0 0  Number falls in past yr: 0 - - - -  Comment - - - - -  Injury with Fall? 0 - - - -  Risk Factor Category  - - - - -  Risk for fall due to : No Fall Risks - - - -  Follow up Falls prevention discussed - - - -    FALL RISK PREVENTION PERTAINING TO THE HOME:  Any stairs in or around the home? Yes  If so, are there any without handrails? No  Home free of loose throw rugs in walkways, pet beds, electrical cords, etc? Yes  Adequate lighting in your home to reduce risk of falls? Yes   ASSISTIVE DEVICES UTILIZED TO PREVENT FALLS:  Life alert? No  Use of a cane, walker or w/c? Yes  Grab bars in the bathroom? No  Shower chair or bench in shower? No  Elevated toilet seat or a handicapped toilet? No    TIMED UP AND GO:  Was the test performed? No .  Telephonic visit.  Cognitive Function: MMSE - Mini Mental State Exam 06/28/2017 05/23/2016 05/20/2015  Orientation to time 5 5 5   Orientation to Place 5 5 5   Registration 3 3 3   Attention/ Calculation 5 3 5   Recall 1 3 3   Language- name 2 objects 2 2 2   Language- repeat 1 1 1   Language- follow 3 step command 3 3 3   Language- read & follow direction 1 1 1   Write a sentence 1 1 1   Copy design 1 1 1   Total score 28 28 30      6CIT Screen 10/29/2019 10/28/2018  What Year? 0 points 0 points  What month? 0 points 0 points  What time? 0 points 0 points  Count back from 20 0 points 0 points  Months in reverse 0 points 0 points  Repeat phrase 0 points 2 points  Total Score 0 2    Immunizations Immunization History  Administered Date(s) Administered  . Fluad Quad(high Dose 65+) 03/25/2019, 04/27/2020  . Influenza Whole 04/01/2012  . Influenza, High Dose Seasonal PF 04/17/2017, 04/16/2018  . Influenza,inj,Quad PF,6+ Mos 04/03/2013, 04/03/2016  . Influenza-Unspecified 03/15/2014  . Moderna Sars-Covid-2 Vaccination 07/21/2019, 08/18/2019, 06/16/2020  . Pneumococcal Conjugate-13 05/17/2014  . Pneumococcal Polysaccharide-23 08/02/2009  . Tdap 01/31/2012  . Zoster 01/31/2012    TDAP status: Up to date  Flu Vaccine status: Up to date  Pneumococcal vaccine status: Up to date  Covid-19 vaccine status: Completed vaccines  Qualifies for Shingles Vaccine? Yes   Zostavax completed Yes   Shingrix Completed?: No.    Education has been provided regarding the importance of this vaccine. Patient has been advised to call insurance company to determine out of pocket expense if they have not yet received this vaccine. Advised may also receive vaccine at local pharmacy or Health Dept. Verbalized acceptance and understanding.  Screening Tests Health Maintenance  Topic Date Due  . DEXA SCAN  08/11/2016  . OPHTHALMOLOGY EXAM  12/07/2020  (Originally 03/02/2020)  . Hepatitis C Screening  10/07/2021 (Originally 1940-08-14)  . FOOT EXAM  11/30/2020  . COVID-19 Vaccine (4 - Booster for Moderna series) 12/15/2020  . INFLUENZA VACCINE  01/30/2021  . URINE MICROALBUMIN  03/02/2021  . HEMOGLOBIN A1C  04/08/2021  . MAMMOGRAM  01/26/2022  . TETANUS/TDAP  01/30/2022  . PNA vac Low Risk Adult  Completed  . HPV VACCINES  Aged Out    Health Maintenance  Health Maintenance Due  Topic Date Due  . DEXA SCAN  08/11/2016    Colorectal cancer screening: No longer required.   Mammogram status: Completed 01/27/2020. Repeat every year  Bone Density screening: done 08/11/2014 - Repeat due if desired  Lung Cancer Screening: (Low Dose CT Chest recommended if Age 41-80 years, 30 pack-year currently smoking OR  have quit w/in 15years.) does not qualify.   Additional Screening:  Hepatitis C Screening: does not qualify  Vision Screening: Recommended annual ophthalmology exams for early detection of glaucoma and other disorders of the eye. Is the patient up to date with their annual eye exam?  Yes  Who is the provider or what is the name of the office in which the patient attends annual eye exams? [facility] If pt is not established with a provider, would they like to be referred to a provider to establish care? No .   Dental Screening: Recommended annual dental exams for proper oral hygiene  Community Resource Referral / Chronic Care Management: CRR required this visit?  No   CCM required this visit?  No      Plan:     I have personally reviewed and noted the following in the patient's chart:   . Medical and social history . Use of alcohol, tobacco or illicit drugs  . Current medications and supplements including opioid prescriptions.  . Functional ability and status . Nutritional status . Physical activity . Advanced directives . List of other physicians . Hospitalizations, surgeries, and ER visits in previous 12  months . Vitals . Screenings to include cognitive, depression, and falls . Referrals and appointments  In addition, I have reviewed and discussed with patient certain preventive protocols, quality metrics, and best practice recommendations. A written personalized care plan for preventive services as well as general preventive health recommendations were provided to patient.     Sandrea Hammond, LPN   [id number]   Nurse Notes: None

## 2020-11-03 ENCOUNTER — Other Ambulatory Visit: Payer: Self-pay

## 2020-11-03 ENCOUNTER — Ambulatory Visit (INDEPENDENT_AMBULATORY_CARE_PROVIDER_SITE_OTHER): Payer: Medicare Other | Admitting: Pharmacist

## 2020-11-03 DIAGNOSIS — E119 Type 2 diabetes mellitus without complications: Secondary | ICD-10-CM

## 2020-11-03 MED ORDER — GLIPIZIDE ER 5 MG PO TB24: 5.0000 mg | ORAL_TABLET | Freq: Every day | ORAL | 3 refills | Status: DC

## 2020-11-03 NOTE — Progress Notes (Signed)
    11/03/2020 Name: Meredith Anderson MRN: [id number] DOB: 12-31-1940   S:  5 yoF Presents for diabetes evaluation, education, and management Patient was referred and last seen by Primary Care Provider on 10/28/20.  Insurance coverage/medication affordability: [employer] LIS extra help  Patient denies adherence with medications. . Current diabetes medications include: Januvia . Current hypertension medications include: dilt Goal 130/80 . Current hyperlipidemia medications include: rosuvastatin   Patient denies hypoglycemic events.   Patient reported dietary habits: Eats 3 meals/day Discussed meal planning options and Plate method for healthy eating . Avoid sugary drinks and desserts . Incorporate balanced protein, non starchy veggies, 1 serving of carbohydrate with each meal . Increase water intake . Increase physical activity as able    O:  Lab Results  Component Value Date   HGBA1C 9.2 (H) 10/07/2020     Lipid Panel     Component Value Date/Time   CHOL 97 (L) 12/01/2019 1350   TRIG 150 (H) 12/01/2019 1350   TRIG 189 (H) 11/24/2014 1028   HDL 31 (L) 12/01/2019 1350   HDL 40 11/24/2014 1028   CHOLHDL 3.1 12/01/2019 1350   CHOLHDL 3.5 11/05/2012 1035   VLDL 28 11/05/2012 1035   LDLCALC 40 12/01/2019 1350   LDLCALC 50 02/25/2014 0912    Home fasting blood sugars: 150-170  2 hour post-meal/random blood sugars: n/a.     A/P:  Diabetes T2DM currently uncontrolled.  Patient is adherent with medication. Control is suboptimal due to recent steroids.  -ADD low dose glipizide XL 5mg  daily with breakfast  Encouraged patient to take with food to avoid hypoglycemia  Call if BG>200 or <80  -Continued Januvia 100mg  daily  -Patient does not wish to take metformin or SGLT2 at this time due to previous side effects/experience   -Extensively discussed pathophysiology of diabetes, recommended lifestyle interventions, dietary effects on blood sugar control  -Counseled on s/sx of  and management of hypoglycemia  -Next A1C anticipated 3 months.  Written patient instructions provided.  Total time in face to face counseling 25 minutes.   Regina Eck, PharmD, BCPS Clinical Pharmacist, [place]  [facility] Phone [phone]

## 2020-11-12 ENCOUNTER — Other Ambulatory Visit: Payer: Self-pay | Admitting: Family Medicine

## 2020-11-12 DIAGNOSIS — I152 Hypertension secondary to endocrine disorders: Secondary | ICD-10-CM

## 2020-11-21 ENCOUNTER — Encounter: Payer: Self-pay | Admitting: Nurse Practitioner

## 2020-11-21 ENCOUNTER — Ambulatory Visit (INDEPENDENT_AMBULATORY_CARE_PROVIDER_SITE_OTHER): Payer: Medicare Other | Admitting: Nurse Practitioner

## 2020-11-21 DIAGNOSIS — U071 COVID-19: Secondary | ICD-10-CM | POA: Diagnosis not present

## 2020-11-21 MED ORDER — AMOXICILLIN-POT CLAVULANATE 875-125 MG PO TABS: 1.0000 | ORAL_TABLET | Freq: Two times a day (BID) | ORAL | 0 refills | Status: DC

## 2020-11-21 NOTE — Assessment & Plan Note (Signed)
>  Advised patient to take medication as prescribed >Augmentin 875-125 mg tablet by mouth daily for 14 days. >Ambulatory referral to COVID-19 clinic completed. >Encourage patient to continue to monitor oxygen saturation, fever, any other sign or symptom of worsening disease.

## 2020-11-21 NOTE — Patient Instructions (Signed)

## 2020-11-21 NOTE — Progress Notes (Deleted)
Acute Office Visit  Subjective:    Patient ID: Meredith Anderson, female    DOB: Nov 19, 1940, 80 y.o.   MRN: [id number]  No chief complaint on file.   HPI Patient is in today for ***  Past Medical History:  Diagnosis Date  . A-fib ([place])   . Allergy    seasonal  . Anxiety   . Arthritis   . 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 Cataract   . Depression 12/12/2015  . Dyshydrosis   . Hemorrhoids   . Hyperlipidemia   . Hypertension   . Menopause   . NIDDM (non-insulin dependent diabetes mellitus)    diet controlled   . Osteopenia 2016  . Thyroid disease     Past Surgical History:  Procedure Laterality Date  . ABDOMINAL HYSTERECTOMY     partial, prolapse  . ABLATION    . bunionectomy bilateral    . EYE SURGERY Bilateral    cataracts  . LUMBAR DISC SURGERY     L4 and L5  . PACEMAKER INSERTION  12/14/2015  . ROTATOR CUFF REPAIR Right   . TUBAL LIGATION      Family History  Problem Relation Age of Onset  . Stroke Mother   . Diabetes Mother   . Heart disease Father   . Atrial fibrillation Father   . Cancer Sister        ovarian / colon  . Diabetes Sister   . Heart disease Brother   . Hyperlipidemia Brother   . Hypertension Brother   . Diabetes Brother   . Heart attack Brother 31       had to perform emergency surgery  . Dementia Brother   . Hyperlipidemia Sister   . Cancer Sister        liver  . Hyperlipidemia Sister   . Diabetes Sister        Boarderline DM  . Cancer Sister   . Diabetes Sister   . Diabetes Daughter   . Rheum arthritis Daughter   . Psoriasis Daughter     Social History   Socioeconomic History  . Marital status: Divorced    Spouse name: Not on file  . Number of children: 2  . Years of education: 98  . Highest education level: High school graduate  Occupational History  . Occupation: Retired  Tobacco Use  . Smoking status: Never Smoker  . Smokeless tobacco: Never Used  Vaping Use  . Vaping Use: Never used  Substance  and Sexual Activity  . Alcohol use: No  . Drug use: No  . Sexual activity: Not Currently  Other Topics Concern  . Not on file  Social History Narrative   Lives alone. Children live nearby   Social Determinants of Health   Financial Resource Strain: Low Risk   . Difficulty of Paying Living Expenses: Not hard at all  Food Insecurity: No Food Insecurity  . Worried About Charity fundraiser in the Last Year: Never true  . Ran Out of Food in the Last Year: Never true  Transportation Needs: No Transportation Needs  . Lack of Transportation (Medical): No  . Lack of Transportation (Non-Medical): No  Physical Activity: Inactive  . Days of Exercise per Week: 0 days  . Minutes of Exercise per Session: 0 min  Stress: No Stress Concern Present  . Feeling of Stress : Not at all  Social Connections: Socially Isolated  . Frequency of Communication with Friends and  Family: More than three times a week  . Frequency of Social Gatherings with Friends and Family: More than three times a week  . Attends Religious Services: Never  . Active Member of Clubs or Organizations: No  . Attends Archivist Meetings: Never  . Marital Status: Divorced  Human resources officer Violence: Not At Risk  . Fear of Current or Ex-Partner: No  . Emotionally Abused: No  . Physically Abused: No  . Sexually Abused: No    Outpatient Medications Prior to Visit  Medication Sig Dispense Refill  . acetaminophen (TYLENOL) 500 MG tablet Take by mouth.    Marland Kitchen azelastine (ASTELIN) 0.1 % nasal spray Place 1 spray into both nostrils 2 (two) times daily. 30 mL 12  . cholecalciferol (VITAMIN D) 1000 UNITS tablet Take 1,000 Units by mouth daily.    . diclofenac Sodium (VOLTAREN) 1 % GEL Apply 2 g topically 4 (four) times daily. (prn joint pain) 400 g 3  . diltiazem (CARDIZEM CD) 120 MG 24 hr capsule TAKE 1 CAPSULE BY MOUTH EVERY DAY 90 capsule 0  . Efinaconazole (JUBLIA) 10 % SOLN Apply to affected nails once daily for 48 weeks.  8 mL prn  . fluticasone (FLONASE) 50 MCG/ACT nasal spray Place 2 sprays into both nostrils daily. 16 g 6  . furosemide (LASIX) 40 MG tablet TAKE 1 TABLET BY MOUTH EVERY DAY 90 tablet 1  . gabapentin (NEURONTIN) 100 MG capsule Take 2 capsules (200 mg total) by mouth 3 (three) times daily. 180 capsule 1  . glipiZIDE (GLUCOTROL XL) 5 MG 24 hr tablet Take 1 tablet (5 mg total) by mouth daily with breakfast. 90 tablet 3  . glucose blood (ACCU-CHEK AVIVA PLUS) test strip CHECK BLOOD SUGAR ONCE A DAY OR AS INSTRUCTED Dx E11.9 100 strip 3  . hydrocortisone (PROCTOSOL HC) 2.5 % rectal cream Place 1 application rectally 2 (two) times daily. As needed for hemorrhoid flares. Max use is 7 consecutive days 30 g 0  . JANUVIA 100 MG tablet TAKE 1 TABLET BY MOUTH EVERY DAY 90 tablet 0  . levothyroxine (SYNTHROID) 125 MCG tablet Take 1 tablet (125 mcg total) by mouth daily before breakfast. 90 tablet 3  . lidocaine (XYLOCAINE) 2 % jelly Apply 1 application topically 3 (three) times daily. Apply to hemorrhoid when painful 30 mL 1  . magnesium oxide (MAG-OX) 400 MG tablet 400 mg.    . Potassium Chloride ER 20 MEQ TBCR TAKE 1 TABLET BY MOUTH EVERY DAY 90 tablet 1  . PREMARIN vaginal cream USING APPLICATOR PLACE 1 GRAM IN VAGINA EVERY OTHER NIGHT AS INSTRUCTED 30 g 3  . Rivaroxaban (XARELTO) 15 MG TABS tablet Take 1 tablet (15 mg total) by mouth daily with supper. 90 tablet 3  . rosuvastatin (CRESTOR) 20 MG tablet TAKE 1 TABLET BY MOUTH EVERY DAY 90 tablet 0  . TETRAHYDROZOLINE HCL OP Apply to eye. Reported on 01/02/2016     No facility-administered medications prior to visit.    Allergies  Allergen Reactions  . Dofetilide Other (See Comments)    Passed out  . Doxycycline     diarrhea   . Amlodipine     Other reaction(s): Other (See Comments) Swollen ankles  . Dronedarone Other (See Comments)    Increased BG  . Glipizide-Metformin Hcl     Other reaction(s): Diarrhea, Other (See Comments) Weakness  .  Metformin     Other reaction(s): Diarrhea  . Niaspan [Niacin] Rash    Review of Systems  Objective:    Physical Exam  There were no vitals taken for this visit. Wt Readings from Last 3 Encounters:  11/01/20 168 lb (76.2 kg)  10/28/20 167 lb 8 oz (76 kg)  10/07/20 167 lb (75.8 kg)    Health Maintenance Due  Topic Date Due  . DEXA SCAN  08/11/2016  . COVID-19 Vaccine (4 - Booster for Moderna series) 09/14/2020    There are no preventive care reminders to display for this patient.   Lab Results  Component Value Date   TSH 2.890 10/12/2020   Lab Results  Component Value Date   WBC 4.7 12/01/2019   HGB 12.7 12/01/2019   HCT 37.6 12/01/2019   MCV 90 12/01/2019   PLT 181 12/01/2019   Lab Results  Component Value Date   NA 140 06/08/2020   K 3.8 06/08/2020   CO2 25 06/08/2020   GLUCOSE 161 (H) 06/08/2020   BUN 11 06/08/2020   CREATININE 0.88 06/08/2020   BILITOT 0.7 12/01/2019   ALKPHOS 100 12/01/2019   AST 18 12/01/2019   ALT 12 12/01/2019   PROT 7.7 12/01/2019   ALBUMIN 4.9 (H) 12/01/2019   CALCIUM 9.8 06/08/2020   Lab Results  Component Value Date   CHOL 97 (L) 12/01/2019   Lab Results  Component Value Date   HDL 31 (L) 12/01/2019   Lab Results  Component Value Date   LDLCALC 40 12/01/2019   Lab Results  Component Value Date   TRIG 150 (H) 12/01/2019   Lab Results  Component Value Date   CHOLHDL 3.1 12/01/2019   Lab Results  Component Value Date   HGBA1C 9.2 (H) 10/07/2020       Assessment & Plan:   Problem List Items Addressed This Visit   None      No orders of the defined types were placed in this encounter.    Ivy Lynn, NP

## 2020-11-21 NOTE — Progress Notes (Signed)
   Virtual Visit  Note Due to COVID-19 pandemic this visit was conducted virtually. This visit type was conducted due to national recommendations for restrictions regarding the COVID-19 Pandemic (e.g. social distancing, sheltering in place) in an effort to limit this patient's exposure and mitigate transmission in our community. All issues noted in this document were discussed and addressed.  A physical exam was not performed with this format.  I connected with PAULEEN GOLEMAN on 11/21/20 at 9:30 am  by telephone and verified that I am speaking with the correct person using two identifiers. MICHELE JUDY is currently located at during visit. The provider, Ivy Lynn, NP is located in their office at time of visit.  I discussed the limitations, risks, security and privacy concerns of performing an evaluation and management service by telephone and the availability of in person appointments. I also discussed with the patient that there may be a patient responsible charge related to this service. The patient expressed understanding and agreed to proceed.   History and Present Illness:  HPI    Review of Systems  Constitutional: Positive for chills. Negative for fever and malaise/fatigue.  HENT: Positive for sore throat.   Eyes: Negative.   Respiratory: Positive for cough.   Cardiovascular: Negative.   Gastrointestinal: Negative.   Genitourinary: Negative.   Musculoskeletal: Negative.   Skin: Negative.   Neurological: Positive for headaches.  All other systems reviewed and are negative.    Observations/Objective: Televisit patient did not sound to be in distress.  Assessment and Plan: >Advised patient to take medication as prescribed >Augmentin 875-125 mg tablet by mouth daily for 15 days. >Ambulatory referral to COVID-19 clinic completed. >Encourage patient to continue to monitor oxygen saturation   Follow Up Instructions: Follow-up with worsening unresolved symptoms.    I  discussed the assessment and treatment plan with the patient. The patient was provided an opportunity to ask questions and all were answered. The patient agreed with the plan and demonstrated an understanding of the instructions.   The patient was advised to call back or seek an in-person evaluation if the symptoms worsen or if the condition fails to improve as anticipated.  The above assessment and management plan was discussed with the patient. The patient verbalized understanding of and has agreed to the management plan. Patient is aware to call the clinic if symptoms persist or worsen. Patient is aware when to return to the clinic for a follow-up visit. Patient educated on when it is appropriate to go to the emergency department.   Time call ended: 9:41 AM. I provided 11 minutes of  non face-to-face time during this encounter.    Ivy Lynn, NP

## 2020-11-22 ENCOUNTER — Telehealth: Payer: Self-pay | Admitting: Adult Health

## 2020-11-22 NOTE — Telephone Encounter (Signed)
Called to discuss with patient about COVID-19 symptoms and the use of one of the available treatments for those with mild to moderate Covid symptoms and at a high risk of hospitalization.  Pt appears to qualify for outpatient treatment due to co-morbid conditions and/or a member of an at-risk group in accordance with the FDA Emergency Use Authorization.    Symptom onset: unclear Vaccinated: yes Booster? yes Immunocompromised? no Qualifiers: age, diabetes NIH Criteria: 1  Unable to reach pt - The phone number listed in the chart is not a working phone number.     Scot Dock

## 2020-11-26 ENCOUNTER — Other Ambulatory Visit: Payer: Self-pay | Admitting: Family Medicine

## 2020-12-01 ENCOUNTER — Ambulatory Visit: Payer: Medicare Other | Admitting: Pharmacist

## 2020-12-02 DIAGNOSIS — R072 Precordial pain: Secondary | ICD-10-CM | POA: Diagnosis not present

## 2020-12-02 DIAGNOSIS — I4891 Unspecified atrial fibrillation: Secondary | ICD-10-CM | POA: Diagnosis not present

## 2020-12-02 DIAGNOSIS — R079 Chest pain, unspecified: Secondary | ICD-10-CM | POA: Diagnosis not present

## 2020-12-02 DIAGNOSIS — Z049 Encounter for examination and observation for unspecified reason: Secondary | ICD-10-CM | POA: Diagnosis not present

## 2020-12-02 DIAGNOSIS — R0602 Shortness of breath: Secondary | ICD-10-CM | POA: Diagnosis not present

## 2020-12-02 DIAGNOSIS — U071 COVID-19: Secondary | ICD-10-CM | POA: Diagnosis not present

## 2020-12-04 DIAGNOSIS — I4891 Unspecified atrial fibrillation: Secondary | ICD-10-CM | POA: Diagnosis not present

## 2020-12-04 DIAGNOSIS — Z950 Presence of cardiac pacemaker: Secondary | ICD-10-CM | POA: Diagnosis not present

## 2020-12-06 ENCOUNTER — Ambulatory Visit: Payer: Medicare Other | Admitting: Family Medicine

## 2020-12-07 ENCOUNTER — Telehealth: Payer: Self-pay

## 2020-12-10 ENCOUNTER — Other Ambulatory Visit: Payer: Self-pay | Admitting: Family Medicine

## 2020-12-10 DIAGNOSIS — G8929 Other chronic pain: Secondary | ICD-10-CM

## 2020-12-13 ENCOUNTER — Ambulatory Visit (INDEPENDENT_AMBULATORY_CARE_PROVIDER_SITE_OTHER): Payer: Medicare Other | Admitting: Family Medicine

## 2020-12-13 ENCOUNTER — Encounter: Payer: Self-pay | Admitting: Family Medicine

## 2020-12-13 ENCOUNTER — Other Ambulatory Visit: Payer: Self-pay

## 2020-12-13 VITALS — BP 158/97 | HR 90 | Temp 97.9°F | Ht 64.0 in | Wt 169.6 lb

## 2020-12-13 DIAGNOSIS — R5383 Other fatigue: Secondary | ICD-10-CM

## 2020-12-13 DIAGNOSIS — K2100 Gastro-esophageal reflux disease with esophagitis, without bleeding: Secondary | ICD-10-CM

## 2020-12-13 LAB — HEMOGLOBIN, FINGERSTICK: Hemoglobin: 11.3 g/dL (ref 11.1–15.9)

## 2020-12-13 NOTE — Progress Notes (Signed)
Subjective: CC: ED f/u for CP PCP: Meredith Norlander, DO [name] is a 80 y.o. female presenting to clinic today for:  1.  Chest pain Patient was seen in the emergency department on 12/02/2020.  She was presenting with chest pain.  She had ACS rule out with negative troponin x2 and EKG without evidence of ischemia.  The pain was felt to be GI in nature as it had improved with Pepcid.  She was advised to follow-up for interval checkup and notes that her symptoms totally resolved after a couple of days on Pepcid.  She has since discontinued the medications and is doing well.  She does report some fatigue but thinks that this may be secondary to the COVID-19 that she had at the end of May.  She is chronically anticoagulated with Xarelto and would like to get her hemoglobin checked if possible today.   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    Cataract    Depression 12/12/2015   Dyshydrosis    Hemorrhoids    Hyperlipidemia    Hypertension    Menopause    NIDDM (non-insulin dependent diabetes mellitus)    diet controlled    Osteopenia 2016   Thyroid disease     Current Outpatient Medications:    acetaminophen (TYLENOL) 500 MG tablet, Take by mouth., Disp: , Rfl:    amoxicillin-clavulanate (AUGMENTIN) 875-125 MG tablet, Take 1 tablet by mouth 2 (two) times daily., Disp: 14 tablet, Rfl: 0   azelastine (ASTELIN) 0.1 % nasal spray, Place 1 spray into both nostrils  2 (two) times daily., Disp: 30 mL, Rfl: 12   cholecalciferol (VITAMIN D) 1000 UNITS tablet, Take 1,000 Units by mouth daily., Disp: , Rfl:    diclofenac Sodium (VOLTAREN) 1 % GEL, Apply 2 g topically 4 (four) times daily. (prn joint pain), Disp: 400 g, Rfl: 3   diltiazem (CARDIZEM CD) 120 MG 24 hr capsule, TAKE 1 CAPSULE BY MOUTH EVERY DAY, Disp: 90 capsule, Rfl: 0   Efinaconazole (JUBLIA) 10 % SOLN, Apply to affected nails once daily for 48 weeks., Disp: 8 mL, Rfl: prn   fluticasone (FLONASE) 50 MCG/ACT nasal spray, Place 2 sprays into both nostrils daily., Disp: 16 g, Rfl: 6   furosemide (LASIX) 40 MG tablet, TAKE 1 TABLET BY MOUTH EVERY DAY, Disp: 90 tablet, Rfl: 1   gabapentin (NEURONTIN) 100 MG capsule, TAKE 2 CAPSULES BY MOUTH 3 TIMES DAILY., Disp: 180 capsule, Rfl: 0   glipiZIDE (GLUCOTROL XL) 5 MG 24 hr tablet, Take 1 tablet (5 mg total) by mouth daily with breakfast., Disp: 90 tablet, Rfl: 3   glucose blood (ACCU-CHEK AVIVA PLUS) test strip, CHECK BLOOD SUGAR ONCE A DAY OR AS INSTRUCTED Dx E11.9, Disp: 100 strip, Rfl: 3   hydrocortisone (PROCTOSOL HC) 2.5 % rectal cream,  Place 1 application rectally 2 (two) times daily. As needed for hemorrhoid flares. Max use is 7 consecutive days, Disp: 30 g, Rfl: 0   JANUVIA 100 MG tablet, TAKE 1 TABLET BY MOUTH EVERY DAY, Disp: 90 tablet, Rfl: 0   levothyroxine (SYNTHROID) 125 MCG tablet, Take 1 tablet (125 mcg total) by mouth daily before breakfast., Disp: 90 tablet, Rfl: 3   lidocaine (XYLOCAINE) 2 % jelly, Apply 1 application topically 3 (three) times daily. Apply to hemorrhoid when painful, Disp: 30 mL, Rfl: 1   magnesium oxide (MAG-OX) 400 MG tablet, 400 mg., Disp: , Rfl:    Potassium Chloride ER 20 MEQ TBCR, TAKE 1 TABLET BY MOUTH EVERY DAY, Disp: 90 tablet, Rfl: 1   PREMARIN vaginal cream, USING APPLICATOR PLACE 1 GRAM IN VAGINA EVERY OTHER NIGHT AS INSTRUCTED, Disp: 30 g, Rfl: 3   rosuvastatin (CRESTOR) 20 MG tablet, TAKE 1 TABLET BY MOUTH EVERY  DAY, Disp: 90 tablet, Rfl: 0   TETRAHYDROZOLINE HCL OP, Apply to eye. Reported on 01/02/2016, Disp: , Rfl:    XARELTO 15 MG TABS tablet, TAKE 1 TABLET (15 MG TOTAL) BY MOUTH DAILY WITH SUPPER., Disp: 90 tablet, Rfl: 1 Social History   Socioeconomic History   Marital status: Divorced    Spouse name: Not on file   Number of children: 2   Years of education: 12   Highest education level: High school graduate  Occupational History   Occupation: Retired  Tobacco Use   Smoking status: Never   Smokeless tobacco: Never  Scientific laboratory technician Use: Never used  Substance and Sexual Activity   Alcohol use: No   Drug use: No   Sexual activity: Not Currently  Other Topics Concern   Not on file  Social History Narrative   Lives alone. Children live nearby   Social Determinants of Health   Financial Resource Strain: Low Risk    Difficulty of Paying Living Expenses: Not hard at all  Food Insecurity: No Food Insecurity   Worried About Charity fundraiser in the Last Year: Never true   Arboriculturist in the Last Year: Never true  Transportation Needs: No Transportation Needs   Lack of Transportation (Medical): No   Lack of Transportation (Non-Medical): No  Physical Activity: Inactive   Days of Exercise per Week: 0 days   Minutes of Exercise per Session: 0 min  Stress: No Stress Concern Present   Feeling of Stress : Not at all  Social Connections: Socially Isolated   Frequency of Communication with Friends and Family: More than three times a week   Frequency of Social Gatherings with Friends and Family: More than three times a week   Attends Religious Services: Never   Marine scientist or Organizations: No   Attends Music therapist: Never   Marital Status: Divorced  Human resources officer Violence: Not At Risk   Fear of Current or Ex-Partner: No   Emotionally Abused: No   Physically Abused: No   Sexually Abused: No   Family History  Problem Relation Age of Onset    Stroke Mother    Diabetes Mother    Heart disease Father    Atrial fibrillation Father    Cancer Sister        ovarian / colon   Diabetes Sister    Heart disease Brother    Hyperlipidemia Brother    Hypertension Brother    Diabetes Brother    Heart attack Brother 74  had to perform emergency surgery   Dementia Brother    Hyperlipidemia Sister    Cancer Sister        liver   Hyperlipidemia Sister    Diabetes Sister        Boarderline DM   Cancer Sister    Diabetes Sister    Diabetes Daughter    Rheum arthritis Daughter    Psoriasis Daughter     Objective: Office vital signs reviewed. BP (!) 158/97   Pulse 90   Temp 97.9 F (36.6 C)   Ht 5\' 4"  (1.626 m)   Wt 169 lb 9.6 oz (76.9 kg)   SpO2 98%   BMI 29.11 kg/m   Physical Examination:  General: Awake, alert, well nourished, No acute distress HEENT: Normal, sclera white, no exophthalmos.  No goiter Cardio: regular rate and rhythm, S1S2 heard, no murmurs appreciated Pulm: clear to auscultation bilaterally, no wheezes, rhonchi or rales; normal work of breathing on room air Extremities: warm, well perfused, No edema, cyanosis or clubbing; +2 pulses bilaterally  Assessment/ Plan: 80 y.o. female   Gastroesophageal reflux disease with esophagitis without hemorrhage  Fatigue, unspecified type - Plan: Hemoglobin, fingerstick  GERD has resolved.  No recurrent chest pain.  I suspect the fatigue to be postviral in nature but will check her hemoglobin given chronic anticoagulation.  TSH in April was totally normal.  No orders of the defined types were placed in this encounter.  No orders of the defined types were placed in this encounter.    Meredith Norlander, DO [place] [phone]

## 2020-12-13 NOTE — Patient Instructions (Signed)
Meredith Anderson will call you next Wednesday.  I think she is going to replace your Glipizide with Glimepiride.

## 2020-12-21 ENCOUNTER — Telehealth: Payer: Self-pay

## 2020-12-22 DIAGNOSIS — Z45018 Encounter for adjustment and management of other part of cardiac pacemaker: Secondary | ICD-10-CM | POA: Diagnosis not present

## 2021-01-04 DIAGNOSIS — I340 Nonrheumatic mitral (valve) insufficiency: Secondary | ICD-10-CM | POA: Diagnosis not present

## 2021-01-04 DIAGNOSIS — I10 Essential (primary) hypertension: Secondary | ICD-10-CM | POA: Diagnosis not present

## 2021-01-04 DIAGNOSIS — I480 Paroxysmal atrial fibrillation: Secondary | ICD-10-CM | POA: Diagnosis not present

## 2021-01-04 DIAGNOSIS — R002 Palpitations: Secondary | ICD-10-CM | POA: Diagnosis not present

## 2021-01-04 DIAGNOSIS — Z79899 Other long term (current) drug therapy: Secondary | ICD-10-CM | POA: Diagnosis not present

## 2021-01-13 ENCOUNTER — Other Ambulatory Visit: Payer: Self-pay | Admitting: Family Medicine

## 2021-01-13 DIAGNOSIS — I10 Essential (primary) hypertension: Secondary | ICD-10-CM

## 2021-01-16 ENCOUNTER — Telehealth: Payer: Self-pay | Admitting: Family Medicine

## 2021-01-16 NOTE — Telephone Encounter (Signed)
Typically, Should not interrupt anticoagulation for DENTAL procedures including extractions (low risk); Dentist can use Cyklokapron; Lysteda (tranexamic acid) to help with any bleeding . Use 5-82min before procedure and after procedure 3-4x/d if needed.

## 2021-01-16 NOTE — Telephone Encounter (Signed)
NA

## 2021-01-20 NOTE — Telephone Encounter (Signed)
As of Friday 01/20/21, patient had not returned call  Procedure was scheduled for 01/18/21.

## 2021-01-26 ENCOUNTER — Other Ambulatory Visit: Payer: Self-pay | Admitting: Family Medicine

## 2021-01-26 DIAGNOSIS — E1159 Type 2 diabetes mellitus with other circulatory complications: Secondary | ICD-10-CM

## 2021-01-27 ENCOUNTER — Other Ambulatory Visit: Payer: Self-pay | Admitting: Family Medicine

## 2021-01-30 ENCOUNTER — Other Ambulatory Visit: Payer: Self-pay | Admitting: Family Medicine

## 2021-01-30 DIAGNOSIS — J3489 Other specified disorders of nose and nasal sinuses: Secondary | ICD-10-CM

## 2021-01-31 ENCOUNTER — Other Ambulatory Visit: Payer: Self-pay | Admitting: Family Medicine

## 2021-01-31 DIAGNOSIS — G8929 Other chronic pain: Secondary | ICD-10-CM

## 2021-01-31 DIAGNOSIS — M5441 Lumbago with sciatica, right side: Secondary | ICD-10-CM

## 2021-02-06 ENCOUNTER — Ambulatory Visit: Payer: Medicare Other | Admitting: Family Medicine

## 2021-02-08 ENCOUNTER — Telehealth: Payer: Self-pay

## 2021-02-10 ENCOUNTER — Encounter: Payer: Self-pay | Admitting: Nurse Practitioner

## 2021-02-10 ENCOUNTER — Ambulatory Visit (INDEPENDENT_AMBULATORY_CARE_PROVIDER_SITE_OTHER): Payer: Medicare Other | Admitting: Nurse Practitioner

## 2021-02-10 ENCOUNTER — Other Ambulatory Visit: Payer: Self-pay

## 2021-02-10 ENCOUNTER — Ambulatory Visit (INDEPENDENT_AMBULATORY_CARE_PROVIDER_SITE_OTHER): Payer: Medicare Other

## 2021-02-10 VITALS — BP 160/87 | HR 87 | Temp 97.9°F | Resp 20 | Ht 64.0 in | Wt 166.0 lb

## 2021-02-10 DIAGNOSIS — M25571 Pain in right ankle and joints of right foot: Secondary | ICD-10-CM

## 2021-02-10 DIAGNOSIS — S9001XA Contusion of right ankle, initial encounter: Secondary | ICD-10-CM | POA: Diagnosis not present

## 2021-02-10 DIAGNOSIS — M7989 Other specified soft tissue disorders: Secondary | ICD-10-CM | POA: Diagnosis not present

## 2021-02-10 NOTE — Progress Notes (Signed)
   Subjective:    Patient ID: Meredith Anderson, female    DOB: Jul 24, 1940, 80 y.o.   MRN: [id number]  Chief Complaint: Ankle Pain (Right ankle. Hit it over rocking chair on front porch)   HPI Patient hit her right lower leg Tuesday morning , just above her ankle on a rocking chair. There was a knot there and it started hurting yesterday she has been putting ice on it. Some pian with walking.     Review of Systems  Musculoskeletal:  Positive for arthralgias (right ankle).      Objective:   Physical Exam Vitals and nursing note reviewed.  Constitutional:      Appearance: Normal appearance.  Musculoskeletal:     Comments: Tender nodule on right distal tibia Right ankle xray negative for fracture  Skin:    General: Skin is warm.     Capillary Refill: Capillary refill takes less than 2 seconds.  Neurological:     General: No focal deficit present.     Mental Status: She is alert and oriented to person, place, and time.    BP (!) 160/87   Pulse 87   Temp 97.9 F (36.6 C) (Temporal)   Resp 20   Ht '5\' 4"'$  (1.626 m)   Wt 166 lb (75.3 kg)   SpO2 96%   BMI 28.49 kg/m         Assessment & Plan:  GLENDIA KEIM in today with chief complaint of Ankle Pain (Right ankle. Hit it over rocking chair on front porch)   1. Acute right ankle pain - DG Ankle Complete Right  2. Contusion of right ankle, initial encounter Rest Ice BID Elevate when sitting Compression wrap when up walking around Tylenol OTC RTO prn.   The above assessment and management plan was discussed with the patient. The patient verbalized understanding of and has agreed to the management plan. Patient is aware to call the clinic if symptoms persist or worsen. Patient is aware when to return to the clinic for a follow-up visit. Patient educated on when it is appropriate to go to the emergency department.   Mary-Margaret Hassell Done, FNP

## 2021-02-10 NOTE — Patient Instructions (Signed)
RICE Therapy for Routine Care of Injuries Many injuries can be cared for with rest, ice, compression, and elevation (RICE therapy). This includes: Resting the injured body part. Putting ice on the injury. Putting pressure (compression) on the injury. Raising the injured part (elevation). Using RICE therapy can help to lessen pain and swelling. Supplies needed: Ice. Plastic bag. Towel. Elastic bandage. Pillow or pillows to raise your injured body part. How to care for your injury with RICE therapy Rest Try to rest the injured part of your body. You can go back to your normal activities when your doctor says it is okay to do them and when you can do themwithout pain. If you rest the injury too much, it may not heal as well. Some injuries heal better with early movement instead of resting for too long. Ask your doctor ifyou should do exercises to help your injury get better. Ice  If told, put ice on the injured area. To do this: Put ice in a plastic bag. Place a towel between your skin and the bag. Leave the ice on for 20 minutes, 2-3 times a day. Take off the ice if your skin turns bright red. This is very important. If you cannot feel pain, heat, or cold, you have a greater risk of damage to the area. Do not put ice on your bare skin. Use ice for as many days as your doctor tells you to use it.  Compression Put pressure on the injured area. This can be done with an elastic bandage. If this type of bandage has been put on your injury: Follow instructions on the package the bandage came in about how to use it. Do not wrap the bandage too tightly. Wrap the bandage more loosely if part of your body beyond the bandage is blue, swollen, cold, painful, or loses feeling. Take off the bandage and put it on again every 3-4 hours or as told by your doctor. See your doctor if the bandage seems to make your problems worse.  Elevation Raise the injured area above the level of your heart while you  are sitting orlying down. Follow these instructions at home: If your symptoms get worse or last a long time, make a follow-up appointment with your doctor. You may need to have imaging tests, such as X-rays or an MRI. If you have imaging tests, ask how to get your results when they are ready. Return to your normal activities when your doctor says that it is safe. Keep all follow-up visits. Contact a doctor if: You keep having pain and swelling. Your symptoms get worse. Get help right away if: You have sudden, very bad pain at your injury or lower than your injury. You have redness or more swelling around your injury. You have tingling or numbness at your injury or lower than your injury, and it does not go away when you take off the bandage. Summary Many injuries can be cared for using rest, ice, compression, and elevation (RICE therapy). You can go back to your normal activities when your doctor says it is okay and when you can do them without pain. Put ice on the injured area as told by your doctor. Get help if your symptoms get worse or if you keep having pain and swelling. This information is not intended to replace advice given to you by your health care provider. Make sure you discuss any questions you have with your healthcare provider. Document Revised: 04/07/2020 Document Reviewed: 04/07/2020 Elsevier Patient Education    2022 Elsevier Inc.  

## 2021-02-14 ENCOUNTER — Other Ambulatory Visit: Payer: Self-pay | Admitting: Family Medicine

## 2021-02-14 DIAGNOSIS — M8949 Other hypertrophic osteoarthropathy, multiple sites: Secondary | ICD-10-CM

## 2021-02-14 DIAGNOSIS — M159 Polyosteoarthritis, unspecified: Secondary | ICD-10-CM

## 2021-02-18 ENCOUNTER — Other Ambulatory Visit: Payer: Self-pay | Admitting: Family Medicine

## 2021-02-21 ENCOUNTER — Ambulatory Visit: Payer: Medicare Other | Admitting: Family Medicine

## 2021-02-22 ENCOUNTER — Telehealth: Payer: Self-pay

## 2021-02-22 ENCOUNTER — Other Ambulatory Visit: Payer: Self-pay | Admitting: Family Medicine

## 2021-02-28 ENCOUNTER — Ambulatory Visit (INDEPENDENT_AMBULATORY_CARE_PROVIDER_SITE_OTHER): Payer: Medicare Other | Admitting: Family Medicine

## 2021-02-28 ENCOUNTER — Encounter: Payer: Self-pay | Admitting: Family Medicine

## 2021-02-28 ENCOUNTER — Other Ambulatory Visit: Payer: Self-pay

## 2021-02-28 VITALS — BP 150/80 | HR 90 | Temp 97.5°F | Ht 64.0 in | Wt 166.8 lb

## 2021-02-28 DIAGNOSIS — I152 Hypertension secondary to endocrine disorders: Secondary | ICD-10-CM | POA: Diagnosis not present

## 2021-02-28 DIAGNOSIS — E785 Hyperlipidemia, unspecified: Secondary | ICD-10-CM

## 2021-02-28 DIAGNOSIS — E1169 Type 2 diabetes mellitus with other specified complication: Secondary | ICD-10-CM | POA: Diagnosis not present

## 2021-02-28 DIAGNOSIS — E034 Atrophy of thyroid (acquired): Secondary | ICD-10-CM | POA: Diagnosis not present

## 2021-02-28 DIAGNOSIS — Z23 Encounter for immunization: Secondary | ICD-10-CM

## 2021-02-28 DIAGNOSIS — E1159 Type 2 diabetes mellitus with other circulatory complications: Secondary | ICD-10-CM

## 2021-02-28 LAB — BAYER DCA HB A1C WAIVED: HB A1C (BAYER DCA - WAIVED): 7.9 % — ABNORMAL HIGH (ref <7.0)

## 2021-02-28 NOTE — Progress Notes (Signed)
Subjective: CC:DM PCP: Janora Norlander, DO KMQ:KMMN Meredith Anderson is a 80 y.o. female presenting to clinic today for:  1. Type 2 Diabetes with hypertension, hyperlipidemia:  Worried that her blood sugar may be up.  She reports blood sugars have been running in the 150s to 180s typically.  She is compliant with her Glucotrol, Januvia.  No reports of blurred vision.  No reports of chest pain or shortness of breath  Last eye exam: Needs Last foot exam: needs Last A1c:  Lab Results  Component Value Date   HGBA1C 9.2 (Meredith) 10/07/2020   Nephropathy screen indicated?: needs Last flu, zoster and/or pneumovax:  Immunization History  Administered Date(s) Administered   Fluad Quad(high Dose 65+) 03/25/2019, 04/27/2020   Influenza Whole 04/01/2012   Influenza, High Dose Seasonal PF 04/17/2017, 04/16/2018   Influenza,inj,Quad PF,6+ Mos 04/03/2013, 04/03/2016   Influenza-Unspecified 03/15/2014   Moderna Sars-Covid-2 Vaccination 07/21/2019, 08/18/2019, 06/16/2020   Pneumococcal Conjugate-13 05/17/2014   Pneumococcal Polysaccharide-23 08/02/2009   Tdap 01/31/2012   Zoster, Live 01/31/2012   2.  Hypothyroidism Compliant with Synthroid.  No change in voice, difficulty swallowing, tremor or heart palpitations.   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    Cataract    Depression 12/12/2015   Dyshydrosis    Hemorrhoids    Hyperlipidemia    Hypertension    Menopause    NIDDM  (non-insulin dependent diabetes mellitus)    diet controlled    Osteopenia 2016   Thyroid disease     Current Outpatient Medications:    acetaminophen (TYLENOL) 500 MG tablet, Take by mouth., Disp: , Rfl:    azelastine (ASTELIN) 0.1 % nasal spray, PLACE 1 SPRAY INTO BOTH NOSTRILS 2 (TWO) TIMES DAILY., Disp: 30 mL, Rfl: 4   cholecalciferol (VITAMIN D) 1000 UNITS tablet, Take 1,000 Units by mouth daily., Disp: , Rfl:    diclofenac Sodium (VOLTAREN) 1 % GEL, APPLY 2 GRAMS TOPICALLY 4 (FOUR) TIMES DAILY. (AS NEEDED FOR JOINT PAIN), Disp: 400 g, Rfl: 3   diltiazem (CARDIZEM CD) 120 MG 24 hr capsule, TAKE 1 CAPSULE BY MOUTH EVERY DAY, Disp: 90 capsule, Rfl: 0   Efinaconazole (JUBLIA) 10 % SOLN, Apply to affected nails once daily for 48 weeks., Disp: 8 mL, Rfl: prn   fluticasone (FLONASE) 50 MCG/ACT nasal spray, Place 2 sprays into both nostrils daily., Disp: 16 g, Rfl: 6   furosemide (LASIX) 40 MG tablet, TAKE 1 TABLET BY MOUTH EVERY DAY, Disp: 90 tablet, Rfl: 1   gabapentin (NEURONTIN) 100 MG capsule, TAKE 2 CAPSULES BY MOUTH 3 TIMES A DAY, Disp: 180 capsule, Rfl: 0   glipiZIDE (GLUCOTROL XL) 5 MG 24 hr tablet, Take 1 tablet (5 mg total) by mouth daily with breakfast., Disp: 90 tablet, Rfl: 3   glucose blood (ACCU-CHEK AVIVA PLUS) test strip,  CHECK BLOOD SUGAR ONCE A DAY OR AS INSTRUCTED Dx E11.9, Disp: 100 strip, Rfl: 3   hydrocortisone (ANUSOL-HC) 2.5 % rectal cream, PLACE 1 APPLICATION RECTALLY AS NEEDED FOR HEMORRHOID FLARES- MAY USE 7 CONSECUTIVE DAYS, Disp: 30 g, Rfl: 0   JANUVIA 100 MG tablet, TAKE 1 TABLET BY MOUTH EVERY DAY, Disp: 90 tablet, Rfl: 0   levothyroxine (SYNTHROID) 125 MCG tablet, Take 1 tablet (125 mcg total) by mouth daily before breakfast., Disp: 90 tablet, Rfl: 3   lidocaine (XYLOCAINE) 2 % jelly, Apply 1 application topically 3 (three) times daily. Apply to hemorrhoid when painful, Disp: 30 mL, Rfl: 1   magnesium oxide (MAG-OX) 400 MG tablet, 400 mg., Disp: , Rfl:     Potassium Chloride ER 20 MEQ TBCR, TAKE 1 TABLET BY MOUTH EVERY DAY, Disp: 90 tablet, Rfl: 0   PREMARIN vaginal cream, USING APPLICATOR PLACE 1 GRAM IN VAGINA EVERY OTHER NIGHT AS INSTRUCTED, Disp: 30 g, Rfl: 3   rosuvastatin (CRESTOR) 20 MG tablet, TAKE 1 TABLET BY MOUTH EVERY DAY, Disp: 90 tablet, Rfl: 0   TETRAHYDROZOLINE HCL OP, Apply to eye. Reported on 01/02/2016, Disp: , Rfl:    XARELTO 15 MG TABS tablet, TAKE 1 TABLET (15 MG TOTAL) BY MOUTH DAILY WITH SUPPER., Disp: 90 tablet, Rfl: 1 Social History   Socioeconomic History   Marital status: Divorced    Spouse name: Not on file   Number of children: 2   Years of education: 12   Highest education level: High school graduate  Occupational History   Occupation: Retired  Tobacco Use   Smoking status: Never   Smokeless tobacco: Never  Scientific laboratory technician Use: Never used  Substance and Sexual Activity   Alcohol use: No   Drug use: No   Sexual activity: Not Currently  Other Topics Concern   Not on file  Social History Narrative   Lives alone. Children live nearby   Social Determinants of Health   Financial Resource Strain: Low Risk    Difficulty of Paying Living Expenses: Not hard at all  Food Insecurity: No Food Insecurity   Worried About Charity fundraiser in the Last Year: Never true   Arboriculturist in the Last Year: Never true  Transportation Needs: No Transportation Needs   Lack of Transportation (Medical): No   Lack of Transportation (Non-Medical): No  Physical Activity: Inactive   Days of Exercise per Week: 0 days   Minutes of Exercise per Session: 0 min  Stress: No Stress Concern Present   Feeling of Stress : Not at all  Social Connections: Socially Isolated   Frequency of Communication with Friends and Family: More than three times a week   Frequency of Social Gatherings with Friends and Family: More than three times a week   Attends Religious Services: Never   Marine scientist or Organizations: No    Attends Music therapist: Never   Marital Status: Divorced  Human resources officer Violence: Not At Risk   Fear of Current or Ex-Partner: No   Emotionally Abused: No   Physically Abused: No   Sexually Abused: No   Family History  Problem Relation Age of Onset   Stroke Mother    Diabetes Mother    Heart disease Father    Atrial fibrillation Father    Cancer Sister        ovarian / colon   Diabetes Sister    Heart disease Brother  Hyperlipidemia Brother    Hypertension Brother    Diabetes Brother    Heart attack Brother 73       had to perform emergency surgery   Dementia Brother    Hyperlipidemia Sister    Cancer Sister        liver   Hyperlipidemia Sister    Diabetes Sister        Boarderline DM   Cancer Sister    Diabetes Sister    Diabetes Daughter    Rheum arthritis Daughter    Psoriasis Daughter     Objective: Office vital signs reviewed. BP (!) 150/80   Pulse 90   Temp (!) 97.5 F (36.4 C)   Ht _0  (1.626 m)   Wt 166 lb 12.8 oz (75.7 kg)   SpO2 97%   BMI 28.63 kg/m   Physical Examination:  General: Awake, alert, well nourished, No acute distress HEENT: Normal; sclera white.  No goiter.  No exophthalmos Cardio: regular rate and rhythm, S1S2 heard, no murmurs appreciated Pulm: clear to auscultation bilaterally, no wheezes, rhonchi or rales; normal work of breathing on room air GI: soft, non-tender, non-distended, bowel sounds present x4, no hepatomegaly, no splenomegaly, no masses Skin: dry; intact; no rashes or lesions Neuro: No tremor  Diabetic Foot Exam - Simple   Simple Foot Form Diabetic Foot exam was performed with the following findings: Yes 02/28/2021 11:21 AM  Visual Inspection No deformities, no ulcerations, no other skin breakdown bilaterally: Yes Sensation Testing Intact to touch and monofilament testing bilaterally: Yes Pulse Check Posterior Tibialis and Dorsalis pulse intact bilaterally: Yes Comments     Assessment/  Plan: 81 y.o. female   Type 2 diabetes mellitus with other specified complication, without long-term current use of insulin (HCC) - Plan: CMP14+EGFR, Bayer DCA Hb A1c Waived, Microalbumin / creatinine urine ratio, Bayer DCA Hb A1c Waived  Hypertension associated with diabetes ([place]) - Plan: CMP14+EGFR  Hyperlipidemia associated with type 2 diabetes mellitus (HCC) - Plan: CMP14+EGFR, Lipid Panel, TSH  Hypothyroidism due to acquired atrophy of thyroid  Sugar is technically within acceptable range for her age.  We discussed that since she is over age 52 and A1c of between 7.0 and 7.9 is acceptable.  I would favor her being closer to the 7.0 however.  Reinforced carb restriction.  We will not change any of her medications.  Would like to see her back again in 3 to 4 months, sooner if concerns arise.  Urine microalbumin, foot exam performed today  Blood pressure borderline.  Continue current regimen  Check lipid panel.  Continue current regimens  Continue Synthroid.  Check thyroid labs.  Asymptomatic  No orders of the defined types were placed in this encounter.  No orders of the defined types were placed in this encounter.    Janora Norlander, DO [place] [phone]

## 2021-02-28 NOTE — Patient Instructions (Signed)
Sugar IS getting better.  A1c 7.9 today.  We discussed keeping your A1c between 7.0-7.9.  No changes to meds today but I want you to [place].  NO breads/ sugar/ sweet/ rice/ potatoes/ pasta

## 2021-03-01 ENCOUNTER — Other Ambulatory Visit: Payer: Self-pay | Admitting: *Deleted

## 2021-03-01 LAB — LIPID PANEL
Chol/HDL Ratio: 2.8 ratio (ref 0.0–4.4)
Cholesterol, Total: 91 mg/dL — ABNORMAL LOW (ref 100–199)
HDL: 33 mg/dL — ABNORMAL LOW (ref >39)
LDL Chol Calc (NIH): 33 mg/dL (ref 0–99)
Triglycerides: 144 mg/dL (ref 0–149)
VLDL Cholesterol Cal: 25 mg/dL (ref 5–40)

## 2021-03-01 LAB — CMP14+EGFR
ALT: 13 IU/L (ref 0–32)
AST: 18 IU/L (ref 0–40)
Albumin/Globulin Ratio: 1.9 (ref 1.2–2.2)
Albumin: 5.1 g/dL — ABNORMAL HIGH (ref 3.7–4.7)
Alkaline Phosphatase: 99 IU/L (ref 44–121)
BUN/Creatinine Ratio: 13 (ref 12–28)
BUN: 13 mg/dL (ref 8–27)
Bilirubin Total: 0.7 mg/dL (ref 0.0–1.2)
CO2: 21 mmol/L (ref 20–29)
Calcium: 9.9 mg/dL (ref 8.7–10.3)
Chloride: 100 mmol/L (ref 96–106)
Creatinine, Ser: 0.97 mg/dL (ref 0.57–1.00)
Globulin, Total: 2.7 g/dL (ref 1.5–4.5)
Glucose: 176 mg/dL — ABNORMAL HIGH (ref 65–99)
Potassium: 3.7 mmol/L (ref 3.5–5.2)
Sodium: 139 mmol/L (ref 134–144)
Total Protein: 7.8 g/dL (ref 6.0–8.5)
eGFR: 59 mL/min/{1.73_m2} — ABNORMAL LOW (ref >59)

## 2021-03-01 LAB — MICROALBUMIN / CREATININE URINE RATIO
Creatinine, Urine: 14.8 mg/dL
Microalb/Creat Ratio: 121 mg/g creat — ABNORMAL HIGH (ref 0–29)
Microalbumin, Urine: 17.9 ug/mL

## 2021-03-01 LAB — TSH: TSH: 2.1 u[IU]/mL (ref 0.450–4.500)

## 2021-03-01 MED ORDER — LISINOPRIL 5 MG PO TABS: 5.0000 mg | ORAL_TABLET | Freq: Every day | ORAL | 3 refills | Status: DC

## 2021-03-01 NOTE — Progress Notes (Signed)
R/c labs

## 2021-03-08 ENCOUNTER — Ambulatory Visit (INDEPENDENT_AMBULATORY_CARE_PROVIDER_SITE_OTHER): Payer: Medicare Other | Admitting: Family Medicine

## 2021-03-08 ENCOUNTER — Other Ambulatory Visit: Payer: Self-pay

## 2021-03-08 DIAGNOSIS — I152 Hypertension secondary to endocrine disorders: Secondary | ICD-10-CM

## 2021-03-08 DIAGNOSIS — E1159 Type 2 diabetes mellitus with other circulatory complications: Secondary | ICD-10-CM | POA: Diagnosis not present

## 2021-03-08 NOTE — Progress Notes (Signed)
Second BP acceptable. No changes needed  Burdette Forehand M. Lajuana Ripple, [facility]

## 2021-03-08 NOTE — Progress Notes (Signed)
Patient came in for Bp Check it was 150/82 HR 89 the first time in the left arm. Second time it was 145/75 HR 86

## 2021-03-09 LAB — BMP8+EGFR
BUN/Creatinine Ratio: 15 (ref 12–28)
BUN: 15 mg/dL (ref 8–27)
CO2: 24 mmol/L (ref 20–29)
Calcium: 10 mg/dL (ref 8.7–10.3)
Chloride: 96 mmol/L (ref 96–106)
Creatinine, Ser: 1.03 mg/dL — ABNORMAL HIGH (ref 0.57–1.00)
Glucose: 147 mg/dL — ABNORMAL HIGH (ref 65–99)
Potassium: 3.9 mmol/L (ref 3.5–5.2)
Sodium: 136 mmol/L (ref 134–144)
eGFR: 55 mL/min/{1.73_m2} — ABNORMAL LOW (ref >59)

## 2021-03-10 ENCOUNTER — Telehealth: Payer: Medicare Other

## 2021-03-21 ENCOUNTER — Encounter: Payer: Self-pay | Admitting: Family Medicine

## 2021-03-21 ENCOUNTER — Ambulatory Visit (INDEPENDENT_AMBULATORY_CARE_PROVIDER_SITE_OTHER): Payer: Medicare Other | Admitting: Family Medicine

## 2021-03-21 ENCOUNTER — Other Ambulatory Visit: Payer: Self-pay

## 2021-03-21 VITALS — BP 143/76 | HR 90 | Temp 97.7°F | Ht 64.0 in | Wt 163.6 lb

## 2021-03-21 DIAGNOSIS — M542 Cervicalgia: Secondary | ICD-10-CM | POA: Diagnosis not present

## 2021-03-21 DIAGNOSIS — M2550 Pain in unspecified joint: Secondary | ICD-10-CM

## 2021-03-21 MED ORDER — METHYLPREDNISOLONE ACETATE 40 MG/ML IJ SUSP
40.0000 mg | Freq: Once | INTRAMUSCULAR | Status: AC
  Administered 2021-03-21: 40 mg via INTRAMUSCULAR

## 2021-03-21 NOTE — Progress Notes (Signed)
Subjective: CC: Neck, shoulder and hand pains PCP: Meredith Norlander, DO YHC:WCBJ [name] is a 80 y.o. female presenting to clinic today for:  1.  Arthritis of neck, shoulders and hands Patient reports that this seemed to be exacerbated after she got her shingles vaccination.  She reports pain in her neck radiating to bilateral shoulders and shoulder blades.  Sometimes her arms are tender to touch.  She denies any overt pain in the arms without touching them however.  She reports some arthralgias in the hands bilaterally and some intermittent numbness and tingling in the tips of her hands.  She takes Tylenol 500 mg every 4-6 hours.  Unable to take oral NSAIDs secondary to chronic anticoagulation.  She has known stenosis of the cervical spine.   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    Cataract    Depression 12/12/2015   Dyshydrosis    Hemorrhoids    Hyperlipidemia    Hypertension    Menopause    NIDDM (non-insulin dependent diabetes mellitus)    diet controlled    Osteopenia 2016   Thyroid disease     Current Outpatient Medications:    acetaminophen (TYLENOL) 500 MG tablet, Take by mouth., Disp: , Rfl:    azelastine (ASTELIN) 0.1 % nasal spray, PLACE 1 SPRAY INTO BOTH NOSTRILS 2 (TWO) TIMES DAILY., Disp: 30 mL, Rfl: 4   cholecalciferol (VITAMIN D) 1000 UNITS tablet, Take 1,000 Units by mouth daily., Disp: , Rfl:    diclofenac Sodium (VOLTAREN) 1 % GEL, APPLY  2 GRAMS TOPICALLY 4 (FOUR) TIMES DAILY. (AS NEEDED FOR JOINT PAIN), Disp: 400 g, Rfl: 3   diltiazem (CARDIZEM CD) 120 MG 24 hr capsule, TAKE 1 CAPSULE BY MOUTH EVERY DAY, Disp: 90 capsule, Rfl: 0   Efinaconazole (JUBLIA) 10 % SOLN, Apply to affected nails once daily for 48 weeks., Disp: 8 mL, Rfl: prn   fluticasone (FLONASE) 50 MCG/ACT nasal spray, Place 2 sprays into both nostrils daily., Disp: 16 g, Rfl: 6   furosemide (LASIX) 40 MG tablet, TAKE 1 TABLET BY MOUTH EVERY DAY, Disp: 90 tablet, Rfl: 1   gabapentin (NEURONTIN) 100 MG capsule, TAKE 2 CAPSULES BY MOUTH 3 TIMES A DAY, Disp: 180 capsule, Rfl: 0   glipiZIDE (GLUCOTROL XL) 5 MG 24 hr tablet, Take 1 tablet (5 mg total) by mouth daily with breakfast., Disp: 90 tablet, Rfl: 3   glucose blood (ACCU-CHEK AVIVA PLUS) test strip, CHECK BLOOD SUGAR ONCE A DAY OR AS INSTRUCTED Dx E11.9, Disp: 100 strip, Rfl: 3   hydrocortisone (ANUSOL-HC) 2.5 % rectal cream, PLACE 1 APPLICATION RECTALLY AS NEEDED FOR HEMORRHOID FLARES- MAY USE 7 CONSECUTIVE DAYS, Disp: 30 g, Rfl: 0   JANUVIA 100 MG tablet, TAKE 1 TABLET BY MOUTH EVERY DAY, Disp: 90 tablet, Rfl: 0   levothyroxine (  SYNTHROID) 125 MCG tablet, Take 1 tablet (125 mcg total) by mouth daily before breakfast., Disp: 90 tablet, Rfl: 3   lidocaine (XYLOCAINE) 2 % jelly, Apply 1 application topically 3 (three) times daily. Apply to hemorrhoid when painful, Disp: 30 mL, Rfl: 1   lisinopril (ZESTRIL) 5 MG tablet, Take 1 tablet (5 mg total) by mouth daily., Disp: 90 tablet, Rfl: 3   magnesium oxide (MAG-OX) 400 MG tablet, 400 mg., Disp: , Rfl:    Potassium Chloride ER 20 MEQ TBCR, TAKE 1 TABLET BY MOUTH EVERY DAY, Disp: 90 tablet, Rfl: 0   PREMARIN vaginal cream, USING APPLICATOR PLACE 1 GRAM IN VAGINA EVERY OTHER NIGHT AS INSTRUCTED, Disp: 30 g, Rfl: 3   rosuvastatin (CRESTOR) 20 MG tablet, TAKE 1 TABLET BY MOUTH EVERY DAY, Disp: 90 tablet, Rfl: 0   TETRAHYDROZOLINE HCL OP, Apply to eye. Reported on 01/02/2016,  Disp: , Rfl:    XARELTO 15 MG TABS tablet, TAKE 1 TABLET (15 MG TOTAL) BY MOUTH DAILY WITH SUPPER., Disp: 90 tablet, Rfl: 1 Social History   Socioeconomic History   Marital status: Divorced    Spouse name: Not on file   Number of children: 2   Years of education: 12   Highest education level: High school graduate  Occupational History   Occupation: Retired  Tobacco Use   Smoking status: Never   Smokeless tobacco: Never  Scientific laboratory technician Use: Never used  Substance and Sexual Activity   Alcohol use: No   Drug use: No   Sexual activity: Not Currently  Other Topics Concern   Not on file  Social History Narrative   Lives alone. Children live nearby   Social Determinants of Health   Financial Resource Strain: Low Risk    Difficulty of Paying Living Expenses: Not hard at all  Food Insecurity: No Food Insecurity   Worried About Charity fundraiser in the Last Year: Never true   Arboriculturist in the Last Year: Never true  Transportation Needs: No Transportation Needs   Lack of Transportation (Medical): No   Lack of Transportation (Non-Medical): No  Physical Activity: Inactive   Days of Exercise per Week: 0 days   Minutes of Exercise per Session: 0 min  Stress: No Stress Concern Present   Feeling of Stress : Not at all  Social Connections: Socially Isolated   Frequency of Communication with Friends and Family: More than three times a week   Frequency of Social Gatherings with Friends and Family: More than three times a week   Attends Religious Services: Never   Marine scientist or Organizations: No   Attends Music therapist: Never   Marital Status: Divorced  Human resources officer Violence: Not At Risk   Fear of Current or Ex-Partner: No   Emotionally Abused: No   Physically Abused: No   Sexually Abused: No   Family History  Problem Relation Age of Onset   Stroke Mother    Diabetes Mother    Heart disease Father    Atrial fibrillation Father     Cancer Sister        ovarian / colon   Diabetes Sister    Heart disease Brother    Hyperlipidemia Brother    Hypertension Brother    Diabetes Brother    Heart attack Brother 26       had to perform emergency surgery   Dementia Brother    Hyperlipidemia Sister    Cancer  Sister        liver   Hyperlipidemia Sister    Diabetes Sister        Boarderline DM   Cancer Sister    Diabetes Sister    Diabetes Daughter    Rheum arthritis Daughter    Psoriasis Daughter     Objective: Office vital signs reviewed. BP (!) 143/76   Pulse 90   Temp 97.7 F (36.5 C)   Ht 5\' 4"  (1.626 m)   Wt 163 lb 9.6 oz (74.2 kg)   SpO2 97%   BMI 28.08 kg/m   Physical Examination:  General: Awake, alert, well appearing elderly female, No acute distress HEENT: Normal, sclera white MSK:   C-spine: Has full painless active range of motion.  No midline tenderness palpation.  Mild increased tonicity of the trapezius muscle but no palpable abnormalities otherwise.  She is able to move bilateral upper extremities without difficulty.  No gross joint deformity, swelling or erythema noted of the hands. Neuro: Vibratory sensation intact peripherally.  Light touch sensation of the upper extremities symmetric and intact.  Assessment/ Plan: 80 y.o. female   Polyarthralgia - Plan: methylPREDNISolone acetate (DEPO-MEDROL) injection 40 mg  Neck pain - Plan: DG Cervical Spine Complete  Uncertain etiology of sudden exacerbation of symptoms.  I discussed with her I would prefer her not to use too much of the Tylenol.  We discussed perhaps using Tylenol arthritis less frequently.  She was given a Depo-Medrol shot intramuscularly today in efforts to improve inflammatory response.  I have placed a future order for C-spine x-ray.  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  Could not find the imaging that demonstrated that however.  No orders of the defined types were placed in this encounter.  No orders  of the defined types were placed in this encounter.    Meredith Norlander, DO [place] [phone]

## 2021-03-23 ENCOUNTER — Telehealth: Payer: Medicare Other

## 2021-03-27 ENCOUNTER — Other Ambulatory Visit (INDEPENDENT_AMBULATORY_CARE_PROVIDER_SITE_OTHER): Payer: Medicare Other

## 2021-03-27 DIAGNOSIS — M542 Cervicalgia: Secondary | ICD-10-CM | POA: Diagnosis not present

## 2021-03-29 DIAGNOSIS — I272 Pulmonary hypertension, unspecified: Secondary | ICD-10-CM | POA: Diagnosis not present

## 2021-03-29 DIAGNOSIS — I119 Hypertensive heart disease without heart failure: Secondary | ICD-10-CM | POA: Diagnosis not present

## 2021-03-29 DIAGNOSIS — I10 Essential (primary) hypertension: Secondary | ICD-10-CM | POA: Diagnosis not present

## 2021-03-29 DIAGNOSIS — I480 Paroxysmal atrial fibrillation: Secondary | ICD-10-CM | POA: Diagnosis not present

## 2021-03-29 DIAGNOSIS — I088 Other rheumatic multiple valve diseases: Secondary | ICD-10-CM | POA: Diagnosis not present

## 2021-03-29 DIAGNOSIS — I340 Nonrheumatic mitral (valve) insufficiency: Secondary | ICD-10-CM | POA: Diagnosis not present

## 2021-04-02 ENCOUNTER — Other Ambulatory Visit: Payer: Self-pay | Admitting: Family Medicine

## 2021-04-02 DIAGNOSIS — G8929 Other chronic pain: Secondary | ICD-10-CM

## 2021-04-02 DIAGNOSIS — M5441 Lumbago with sciatica, right side: Secondary | ICD-10-CM

## 2021-04-06 DIAGNOSIS — I340 Nonrheumatic mitral (valve) insufficiency: Secondary | ICD-10-CM | POA: Diagnosis not present

## 2021-04-06 DIAGNOSIS — I483 Typical atrial flutter: Secondary | ICD-10-CM | POA: Diagnosis not present

## 2021-04-06 DIAGNOSIS — E119 Type 2 diabetes mellitus without complications: Secondary | ICD-10-CM | POA: Diagnosis not present

## 2021-04-06 DIAGNOSIS — Z9889 Other specified postprocedural states: Secondary | ICD-10-CM | POA: Diagnosis not present

## 2021-04-06 DIAGNOSIS — Z7901 Long term (current) use of anticoagulants: Secondary | ICD-10-CM | POA: Diagnosis not present

## 2021-04-06 DIAGNOSIS — Z950 Presence of cardiac pacemaker: Secondary | ICD-10-CM | POA: Diagnosis not present

## 2021-04-06 DIAGNOSIS — I495 Sick sinus syndrome: Secondary | ICD-10-CM | POA: Diagnosis not present

## 2021-04-06 DIAGNOSIS — I10 Essential (primary) hypertension: Secondary | ICD-10-CM | POA: Diagnosis not present

## 2021-04-06 DIAGNOSIS — I4891 Unspecified atrial fibrillation: Secondary | ICD-10-CM | POA: Diagnosis not present

## 2021-04-06 DIAGNOSIS — Z79899 Other long term (current) drug therapy: Secondary | ICD-10-CM | POA: Diagnosis not present

## 2021-04-06 DIAGNOSIS — I480 Paroxysmal atrial fibrillation: Secondary | ICD-10-CM | POA: Diagnosis not present

## 2021-04-06 DIAGNOSIS — I482 Chronic atrial fibrillation, unspecified: Secondary | ICD-10-CM | POA: Diagnosis not present

## 2021-04-06 DIAGNOSIS — K219 Gastro-esophageal reflux disease without esophagitis: Secondary | ICD-10-CM | POA: Diagnosis not present

## 2021-04-06 DIAGNOSIS — E039 Hypothyroidism, unspecified: Secondary | ICD-10-CM | POA: Diagnosis not present

## 2021-04-07 DIAGNOSIS — I10 Essential (primary) hypertension: Secondary | ICD-10-CM | POA: Diagnosis not present

## 2021-04-12 ENCOUNTER — Other Ambulatory Visit: Payer: Self-pay | Admitting: Family Medicine

## 2021-04-12 DIAGNOSIS — I10 Essential (primary) hypertension: Secondary | ICD-10-CM

## 2021-04-25 ENCOUNTER — Telehealth: Payer: Self-pay | Admitting: Pharmacist

## 2021-04-25 ENCOUNTER — Telehealth: Payer: Medicare Other

## 2021-04-25 NOTE — Telephone Encounter (Signed)
  Care Management   Follow Up Note   04/25/2021 Name: Meredith Anderson MRN: [id number] DOB: 25-Jan-1941   Referred by: Janora Norlander, DO Reason for referral : Medication Management ([place])   An unsuccessful telephone outreach was attempted today. The patient was referred to the case management team for assistance with care management and care coordination.   Follow Up Plan: Telephone follow up appointment with care management team member scheduled for:4-6 WEEKS AS [place] D[place] Hanifa Antonetti, PharmD, BCPS Clinical Pharmacist, [place]  II Phone [phone]

## 2021-04-28 ENCOUNTER — Other Ambulatory Visit: Payer: Self-pay | Admitting: Family Medicine

## 2021-04-28 DIAGNOSIS — E1159 Type 2 diabetes mellitus with other circulatory complications: Secondary | ICD-10-CM

## 2021-04-28 DIAGNOSIS — I152 Hypertension secondary to endocrine disorders: Secondary | ICD-10-CM

## 2021-04-29 ENCOUNTER — Other Ambulatory Visit: Payer: Self-pay | Admitting: Family Medicine

## 2021-05-12 ENCOUNTER — Other Ambulatory Visit: Payer: Self-pay | Admitting: Family Medicine

## 2021-05-12 DIAGNOSIS — E1159 Type 2 diabetes mellitus with other circulatory complications: Secondary | ICD-10-CM

## 2021-05-18 ENCOUNTER — Other Ambulatory Visit: Payer: Self-pay | Admitting: Family Medicine

## 2021-05-19 ENCOUNTER — Other Ambulatory Visit: Payer: Self-pay | Admitting: Family Medicine

## 2021-05-23 ENCOUNTER — Telehealth: Payer: Medicare Other

## 2021-05-27 ENCOUNTER — Other Ambulatory Visit: Payer: Self-pay | Admitting: Family Medicine

## 2021-05-27 DIAGNOSIS — G8929 Other chronic pain: Secondary | ICD-10-CM

## 2021-06-10 ENCOUNTER — Other Ambulatory Visit: Payer: Self-pay | Admitting: Family Medicine

## 2021-06-10 DIAGNOSIS — I10 Essential (primary) hypertension: Secondary | ICD-10-CM

## 2021-06-20 ENCOUNTER — Ambulatory Visit (INDEPENDENT_AMBULATORY_CARE_PROVIDER_SITE_OTHER): Payer: Medicare Other | Admitting: Nurse Practitioner

## 2021-06-20 ENCOUNTER — Encounter: Payer: Self-pay | Admitting: Nurse Practitioner

## 2021-06-20 VITALS — BP 128/73 | HR 84 | Temp 97.6°F | Resp 20 | Ht 64.0 in | Wt 166.0 lb

## 2021-06-20 DIAGNOSIS — M542 Cervicalgia: Secondary | ICD-10-CM

## 2021-06-20 MED ORDER — METHYLPREDNISOLONE ACETATE 80 MG/ML IJ SUSP
80.0000 mg | Freq: Once | INTRAMUSCULAR | Status: AC
Start: 2021-06-20 — End: 2021-06-20
  Administered 2021-06-20: 12:00:00 80 mg via INTRAMUSCULAR

## 2021-06-20 NOTE — Progress Notes (Signed)
° °  Subjective:    Patient ID: Meredith Anderson, female    DOB: [birth year], 80 y.o.   MRN: [id number]   Chief Complaint: Neck Pain and Bilateral shouder pain   HPI Patient come sin c/o of neck pain with right side worse the left. She has been dx with arthritis in neck in the past. Has benn hurting for a month. She has been taking tylenol and using voltaren cream. Rates pain 8/10 currently. Denies nay upper ext weakness.    Review of Systems  Constitutional:  Negative for diaphoresis.  Eyes:  Negative for pain.  Respiratory:  Negative for shortness of breath.   Cardiovascular:  Negative for chest pain, palpitations and leg swelling.  Gastrointestinal:  Negative for abdominal pain.  Endocrine: Negative for polydipsia.  Musculoskeletal:  Positive for arthralgias and neck pain.  Skin:  Negative for rash.  Neurological:  Negative for dizziness, weakness and headaches.  Hematological:  Does not bruise/bleed easily.  All other systems reviewed and are negative.     Objective:   Physical Exam Vitals reviewed.  Constitutional:      Appearance: Normal appearance.  Cardiovascular:     Rate and Rhythm: Normal rate and regular rhythm.     Heart sounds: Normal heart sounds.  Pulmonary:     Effort: Pulmonary effort is normal.     Breath sounds: Normal breath sounds.  Musculoskeletal:     Comments: ROM of neck with pain on movement in any direction Grips equal bil  Motor strength and sensation distally intact.  Skin:    General: Skin is warm.  Neurological:     General: No focal deficit present.     Mental Status: She is alert and oriented to person, place, and time.  Psychiatric:        Mood and Affect: Mood normal.        Behavior: Behavior normal.    BP 128/73    Pulse 84    Temp 97.6 F (36.4 C) (Temporal)    Resp 20    Ht 5\' 4"  (1.626 m)    Wt 166 lb (75.3 kg)    SpO2 100%    BMI 28.49 kg/m        Assessment & Plan:   Meredith Anderson in today with chief complaint of Neck  Pain and Bilateral shouder pain   1. Neck pain Moist heat Stretching exercises Watch blood sugars closely the next few days Tylenol as needed Continue voltaren gel as previously prescribed RTO if no improvement - methylPREDNISolone acetate (DEPO-MEDROL) injection 80 mg    The above assessment and management plan was discussed with the patient. The patient verbalized understanding of and has agreed to the management plan. Patient is aware to call the clinic if symptoms persist or worsen. Patient is aware when to return to the clinic for a follow-up visit. Patient educated on when it is appropriate to go to the emergency department.   Mary-Margaret Hassell Done, FNP

## 2021-06-23 DIAGNOSIS — Z950 Presence of cardiac pacemaker: Secondary | ICD-10-CM | POA: Diagnosis not present

## 2021-06-23 DIAGNOSIS — I4891 Unspecified atrial fibrillation: Secondary | ICD-10-CM | POA: Diagnosis not present

## 2021-06-30 ENCOUNTER — Ambulatory Visit (INDEPENDENT_AMBULATORY_CARE_PROVIDER_SITE_OTHER): Payer: Medicare Other | Admitting: Family Medicine

## 2021-06-30 ENCOUNTER — Encounter: Payer: Self-pay | Admitting: Family Medicine

## 2021-06-30 DIAGNOSIS — B351 Tinea unguium: Secondary | ICD-10-CM | POA: Diagnosis not present

## 2021-06-30 DIAGNOSIS — E1169 Type 2 diabetes mellitus with other specified complication: Secondary | ICD-10-CM

## 2021-06-30 DIAGNOSIS — M5441 Lumbago with sciatica, right side: Secondary | ICD-10-CM

## 2021-06-30 DIAGNOSIS — E785 Hyperlipidemia, unspecified: Secondary | ICD-10-CM

## 2021-06-30 DIAGNOSIS — E034 Atrophy of thyroid (acquired): Secondary | ICD-10-CM | POA: Diagnosis not present

## 2021-06-30 DIAGNOSIS — E1159 Type 2 diabetes mellitus with other circulatory complications: Secondary | ICD-10-CM

## 2021-06-30 DIAGNOSIS — I152 Hypertension secondary to endocrine disorders: Secondary | ICD-10-CM

## 2021-06-30 DIAGNOSIS — G8929 Other chronic pain: Secondary | ICD-10-CM

## 2021-06-30 MED ORDER — GABAPENTIN 100 MG PO CAPS: 200.0000 mg | ORAL_CAPSULE | Freq: Three times a day (TID) | ORAL | 3 refills | Status: DC

## 2021-06-30 MED ORDER — LEVOTHYROXINE SODIUM 125 MCG PO TABS: 125.0000 ug | ORAL_TABLET | Freq: Every day | ORAL | 3 refills | Status: DC

## 2021-06-30 MED ORDER — SITAGLIPTIN PHOSPHATE 100 MG PO TABS: 100.0000 mg | ORAL_TABLET | Freq: Every day | ORAL | 3 refills | Status: DC

## 2021-06-30 MED ORDER — ROSUVASTATIN CALCIUM 20 MG PO TABS: 20.0000 mg | ORAL_TABLET | Freq: Every day | ORAL | 3 refills | Status: DC

## 2021-06-30 MED ORDER — DILTIAZEM HCL ER COATED BEADS 120 MG PO CP24
120.0000 mg | ORAL_CAPSULE | Freq: Every day | ORAL | 3 refills | Status: DC
Start: 2021-06-30 — End: 2022-07-05

## 2021-06-30 MED ORDER — LIDOCAINE 5 % EX PTCH: 1.0000 | MEDICATED_PATCH | CUTANEOUS | 3 refills | Status: DC

## 2021-06-30 MED ORDER — FUROSEMIDE 40 MG PO TABS: 40.0000 mg | ORAL_TABLET | Freq: Every day | ORAL | 3 refills | Status: DC

## 2021-06-30 MED ORDER — TERBINAFINE HCL 250 MG PO TABS: 250.0000 mg | ORAL_TABLET | Freq: Every day | ORAL | 0 refills | Status: DC

## 2021-06-30 NOTE — Progress Notes (Signed)
Telephone visit  Subjective: CC:DM PCP: Janora Norlander, DO Meredith Anderson is a 79 y.o. female calls for telephone consult today. Patient provides verbal consent for consult held via phone.  Due to COVID-19 pandemic this visit was conducted virtually. This visit type was conducted due to national recommendations for restrictions regarding the COVID-19 Pandemic (e.g. social distancing, sheltering in place) in an effort to limit this patient's exposure and mitigate transmission in our community. All issues noted in this document were discussed and addressed.  A physical exam was not performed with this format.   Location of patient: home Location of provider: [facility] Others present for call: none  1. Type 2 Diabetes with hypertension, hyperlipidemia:  Reports blood sugars rising into the 160s to 180s after corticosteroid injection.  She has since resumed use of glipizide.  Compliant with Januvia.  Compliant with blood pressure and cholesterol medications. . Last eye exam: Needs Last foot exam: Needs Last A1c:  Lab Results  Component Value Date   HGBA1C 7.9 (H) 02/28/2021   Nephropathy screen indicated?:  On ACE inhibitor Last flu, zoster and/or pneumovax:  Immunization History  Administered Date(s) Administered   Fluad Quad(high Dose 65+) 03/25/2019, 04/27/2020   Influenza Whole 04/01/2012   Influenza, High Dose Seasonal PF 04/17/2017, 04/16/2018   Influenza,inj,Quad PF,6+ Mos 04/03/2013, 04/03/2016   Influenza-Unspecified 03/15/2014   Moderna Sars-Covid-2 Vaccination 07/21/2019, 08/18/2019, 06/16/2020   Pneumococcal Conjugate-13 05/17/2014   Pneumococcal Polysaccharide-23 08/02/2009   Tdap 01/31/2012   Zoster Recombinat (Shingrix) 02/28/2021   Zoster, Live 01/31/2012    ROS: No chest pain, shortness of breath or dizziness reported  2.  Back pain Patient had a recent exacerbation of back pain and needed a corticosteroid injection.  She notes that symptoms are quite a bit  better since that injection.  She has seen some lability in blood sugars as above.  Would like to have some lidocaine patches on hand as these have been helpful before.  Unable to take oral NSAIDs and Tylenol arthritis has been ineffective  3.  Onychomycosis Noted to have onychomycosis last visit.  She was started on Jublia topically but she has noticed really no difference whatsoever in her fungus.  She would like to go on the oral instead.  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    Cataract    Depression 12/12/2015   Dyshydrosis    Hemorrhoids    Hyperlipidemia    Hypertension    Menopause    NIDDM (non-insulin dependent diabetes mellitus)    diet controlled    Osteopenia 2016   Syncope 11/08/2015   Thyroid disease     Current Outpatient Medications:    acetaminophen (TYLENOL) 500 MG tablet, Take by mouth., Disp: , Rfl:    azelastine (ASTELIN) 0.1 % nasal spray, PLACE 1 SPRAY INTO BOTH NOSTRILS  2 (TWO) TIMES DAILY., Disp: 30 mL, Rfl: 4   cholecalciferol (VITAMIN D) 1000 UNITS tablet, Take 1,000 Units by mouth daily., Disp: , Rfl:    diclofenac Sodium (VOLTAREN) 1 % GEL, APPLY 2 GRAMS TOPICALLY 4 (FOUR) TIMES DAILY. (AS NEEDED FOR JOINT PAIN), Disp: 400 g, Rfl: 3   diltiazem (CARDIZEM CD) 120 MG 24 hr capsule, TAKE 1 CAPSULE BY MOUTH EVERY DAY, Disp: 90 capsule, Rfl: 0   Efinaconazole (JUBLIA) 10 % SOLN, Apply to affected nails once daily for 48 weeks., Disp: 8 mL,  Rfl: prn   fluticasone (FLONASE) 50 MCG/ACT nasal spray, Place 2 sprays into both nostrils daily., Disp: 16 g, Rfl: 6   furosemide (LASIX) 40 MG tablet, TAKE 1 TABLET BY MOUTH EVERY DAY, Disp: 90 tablet, Rfl: 0   gabapentin (NEURONTIN) 100 MG capsule, TAKE 2 CAPSULES BY MOUTH 3 TIMES A DAY, Disp: 180 capsule, Rfl: 0   glipiZIDE (GLUCOTROL XL) 5 MG 24 hr tablet, Take 1 tablet (5 mg total) by mouth daily with breakfast., Disp: 90 tablet, Rfl: 3   glucose blood (ACCU-CHEK AVIVA PLUS) test strip, CHECK BLOOD SUGAR ONCE A DAY OR AS INSTRUCTED Dx E11.9, Disp: 100 strip, Rfl: 3   hydrocortisone (ANUSOL-HC) 2.5 % rectal cream, PLACE 1 APPLICATION RECTALLY AS NEEDED FOR HEMORRHOID FLARES- MAY USE 7 CONSECUTIVE DAYS, Disp: 30 g, Rfl: 0   JANUVIA 100 MG tablet, TAKE 1 TABLET BY MOUTH EVERY DAY, Disp: 30 tablet, Rfl: 0   levothyroxine (SYNTHROID) 125 MCG tablet, TAKE 1 TABLET BY MOUTH DAILY BEFORE BREAKFAST., Disp: 30 tablet, Rfl: 0   lidocaine (XYLOCAINE) 2 % jelly, Apply 1 application topically 3 (three) times daily. Apply to hemorrhoid when painful, Disp: 30 mL, Rfl: 1   lisinopril (ZESTRIL) 5 MG tablet, Take 1 tablet (5 mg total) by mouth daily., Disp: 90 tablet, Rfl: 3   magnesium oxide (MAG-OX) 400 MG tablet, 400 mg., Disp: , Rfl:    Potassium Chloride ER 20 MEQ TBCR, TAKE 1 TABLET BY MOUTH EVERY DAY, Disp: 90 tablet, Rfl: 0   PREMARIN vaginal cream, USING APPLICATOR PLACE 1 GRAM IN VAGINA EVERY OTHER NIGHT AS INSTRUCTED, Disp: 30 g, Rfl: 3   rosuvastatin (CRESTOR) 20 MG tablet, TAKE 1 TABLET BY MOUTH EVERY DAY, Disp: 90 tablet, Rfl: 0   TETRAHYDROZOLINE HCL OP, Apply to eye. Reported on 01/02/2016, Disp: , Rfl:    XARELTO 15 MG TABS tablet, TAKE 1 TABLET (15 MG TOTAL) BY MOUTH DAILY WITH SUPPER., Disp: 90 tablet, Rfl: 1  Assessment/ Plan: 80 y.o. female   Type 2 diabetes mellitus with other specified complication, without long-term current use of insulin (HCC) - Plan: Bayer DCA Hb A1c Waived,  sitaGLIPtin (JANUVIA) 100 MG tablet, CMP14+EGFR  Hyperlipidemia associated with type 2 diabetes mellitus ([place]) - Plan: rosuvastatin (CRESTOR) 20 MG tablet, CMP14+EGFR  Hypertension associated with diabetes ([place]) - Plan: diltiazem (CARDIZEM CD) 120 MG 24 hr capsule, furosemide (LASIX) 40 MG tablet, CMP14+EGFR  Onychomycosis of toenail - Plan: terbinafine (LAMISIL) 250 MG tablet  Hypothyroidism due to acquired atrophy of thyroid - Plan: TSH, T4, free, levothyroxine (SYNTHROID) 125 MCG tablet  Chronic right-sided low back pain with right-sided sciatica - Plan: lidocaine (LIDODERM) 5 %, gabapentin (NEURONTIN) 100 MG capsule  She will come in for labs next week.  I encouraged her to continue glipizide and Januvia as prescribed, particular given recent corticosteroid injection  Not yet due for fasting lipid panel.  Check liver enzymes, kidney function  BP meds have  been renewed.  Not having much success with topical for fungus and would like to start oral.  Lamisil 250 mg sent.  We have scheduled her a 6-week follow-up for LFTs  Asymptomatic from a thyroid standpoint.  Check TSH, free T4  Gabapentin renewed for chronic back pain.  Lidocaine patches given for as needed use.  She is going to be contacting her orthopedist soon with regards to some radicular symptoms she has been experiencing  Start time: 1:30pm End time: 1:41pm  Total time spent on patient care (including telephone call/ virtual visit): 11 minutes  [place], [place] [phone]

## 2021-07-04 ENCOUNTER — Other Ambulatory Visit: Payer: Medicare Other

## 2021-07-04 ENCOUNTER — Other Ambulatory Visit: Payer: Self-pay

## 2021-07-04 DIAGNOSIS — E1169 Type 2 diabetes mellitus with other specified complication: Secondary | ICD-10-CM

## 2021-07-04 DIAGNOSIS — I152 Hypertension secondary to endocrine disorders: Secondary | ICD-10-CM | POA: Diagnosis not present

## 2021-07-04 DIAGNOSIS — E785 Hyperlipidemia, unspecified: Secondary | ICD-10-CM | POA: Diagnosis not present

## 2021-07-04 DIAGNOSIS — E034 Atrophy of thyroid (acquired): Secondary | ICD-10-CM

## 2021-07-04 DIAGNOSIS — E1159 Type 2 diabetes mellitus with other circulatory complications: Secondary | ICD-10-CM | POA: Diagnosis not present

## 2021-07-04 LAB — BAYER DCA HB A1C WAIVED: HB A1C (BAYER DCA - WAIVED): 7.9 % — ABNORMAL HIGH (ref 4.8–5.6)

## 2021-07-05 LAB — CMP14+EGFR
ALT: 25 IU/L (ref 0–32)
AST: 24 IU/L (ref 0–40)
Albumin/Globulin Ratio: 1.9 (ref 1.2–2.2)
Albumin: 5.1 g/dL — ABNORMAL HIGH (ref 3.7–4.7)
Alkaline Phosphatase: 136 IU/L — ABNORMAL HIGH (ref 44–121)
BUN/Creatinine Ratio: 15 (ref 12–28)
BUN: 16 mg/dL (ref 8–27)
Bilirubin Total: 0.6 mg/dL (ref 0.0–1.2)
CO2: 24 mmol/L (ref 20–29)
Calcium: 10.5 mg/dL — ABNORMAL HIGH (ref 8.7–10.3)
Chloride: 96 mmol/L (ref 96–106)
Creatinine, Ser: 1.05 mg/dL — ABNORMAL HIGH (ref 0.57–1.00)
Globulin, Total: 2.7 g/dL (ref 1.5–4.5)
Glucose: 163 mg/dL — ABNORMAL HIGH (ref 70–99)
Potassium: 4.4 mmol/L (ref 3.5–5.2)
Sodium: 137 mmol/L (ref 134–144)
Total Protein: 7.8 g/dL (ref 6.0–8.5)
eGFR: 54 mL/min/{1.73_m2} — ABNORMAL LOW (ref >59)

## 2021-07-05 LAB — TSH: TSH: 0.533 u[IU]/mL (ref 0.450–4.500)

## 2021-07-05 LAB — T4, FREE: Free T4: 1.9 ng/dL — ABNORMAL HIGH (ref 0.82–1.77)

## 2021-07-12 ENCOUNTER — Other Ambulatory Visit: Payer: Self-pay | Admitting: Family Medicine

## 2021-07-12 DIAGNOSIS — E034 Atrophy of thyroid (acquired): Secondary | ICD-10-CM

## 2021-07-18 ENCOUNTER — Telehealth: Payer: Self-pay | Admitting: Pharmacist

## 2021-07-18 ENCOUNTER — Telehealth: Payer: Medicare Other | Admitting: Pharmacist

## 2021-07-18 NOTE — Telephone Encounter (Signed)
°  Care Management   Follow Up Note   07/18/2021 Name: Meredith Anderson MRN: [id number] DOB: Nov 11, 1940   Referred by: Janora Norlander, DO Reason for referral : Appointment (Unsuccessful outreach/)   A second unsuccessful telephone outreach was attempted today. The patient was referred to the case management team for assistance with care management and care coordination.   Follow Up Plan: No further follow up required: please have patient reach out if PharmD assistance is needed   Regina Eck, PharmD, BCPS Clinical Pharmacist, [place]  [facility] Phone [phone]

## 2021-07-25 ENCOUNTER — Other Ambulatory Visit: Payer: Self-pay | Admitting: Family Medicine

## 2021-07-26 DIAGNOSIS — R0609 Other forms of dyspnea: Secondary | ICD-10-CM | POA: Diagnosis not present

## 2021-07-26 DIAGNOSIS — I483 Typical atrial flutter: Secondary | ICD-10-CM | POA: Diagnosis not present

## 2021-07-26 DIAGNOSIS — Z9889 Other specified postprocedural states: Secondary | ICD-10-CM | POA: Diagnosis not present

## 2021-07-26 DIAGNOSIS — I480 Paroxysmal atrial fibrillation: Secondary | ICD-10-CM | POA: Diagnosis not present

## 2021-07-26 DIAGNOSIS — Z79899 Other long term (current) drug therapy: Secondary | ICD-10-CM | POA: Diagnosis not present

## 2021-07-26 DIAGNOSIS — I340 Nonrheumatic mitral (valve) insufficiency: Secondary | ICD-10-CM | POA: Diagnosis not present

## 2021-07-26 DIAGNOSIS — Z950 Presence of cardiac pacemaker: Secondary | ICD-10-CM | POA: Diagnosis not present

## 2021-08-16 ENCOUNTER — Ambulatory Visit (INDEPENDENT_AMBULATORY_CARE_PROVIDER_SITE_OTHER): Payer: Medicare Other | Admitting: Family Medicine

## 2021-08-16 ENCOUNTER — Encounter: Payer: Self-pay | Admitting: Family Medicine

## 2021-08-16 VITALS — BP 137/76 | HR 90 | Temp 97.8°F | Ht 64.0 in | Wt 162.8 lb

## 2021-08-16 DIAGNOSIS — E1169 Type 2 diabetes mellitus with other specified complication: Secondary | ICD-10-CM | POA: Diagnosis not present

## 2021-08-16 DIAGNOSIS — I152 Hypertension secondary to endocrine disorders: Secondary | ICD-10-CM | POA: Diagnosis not present

## 2021-08-16 DIAGNOSIS — B351 Tinea unguium: Secondary | ICD-10-CM | POA: Diagnosis not present

## 2021-08-16 DIAGNOSIS — E1159 Type 2 diabetes mellitus with other circulatory complications: Secondary | ICD-10-CM | POA: Diagnosis not present

## 2021-08-16 MED ORDER — GLIPIZIDE 5 MG PO TABS: 5.0000 mg | ORAL_TABLET | Freq: Every day | ORAL | 3 refills | Status: DC

## 2021-08-16 NOTE — Patient Instructions (Signed)
Your blood pressure is well controlled today.  I suspect what is happening is that your blood pressure medication is running out the closer you get to 6:00.  What I would like you to do is check your blood pressure about 1 hour after you take your morning medication and/or 1 hour after you take your evening lisinopril.  This should give [place] a better idea of what your blood pressure is really running  For the next 2 weeks and when she to use Debrox in your ears.  This can be obtained over-the-counter.  This will help you soften the wax so that we can get it out for you in 2 weeks.  If it comes out on its own, you can cancel that appointment  Okay to take your Lamisil.  This does not have anything to do with your blood pressure.  I have ordered your liver function test to get through blood work in about 6 weeks.

## 2021-08-16 NOTE — Progress Notes (Signed)
Subjective: Meredith Anderson fungus PCP: Janora Norlander, DO ZRA:QTMA H Shealy is a 81 y.o. female presenting to clinic today for:  1.  Onychomycosis Prescribed Lamisil at the end of December.  She is here for interval checkup and liver function tests.  However, she notes that she never did start it because her blood pressures were elevated and she was scared that this would make things worse.  2.  Hypertension Patient was noted to have elevated blood pressure reading at her last cardiology visit.  They really reiterated that blood pressure needed to be under good control.  She notes that she has been checking blood pressures at home and they range anywhere from 120s on the top to 160s.  She typically takes her medications at separate times, the Cardizem being in the morning and the lisinopril being in the evening.  However she has been checking her blood pressure around 430 most days.  She denies any chest pain, shortness of breath, dizziness or edema.  2.  Diabetes Patient is compliant with her Januvia, glipizide.  She only takes the glipizide every other day because it makes her belly hurt sometimes otherwise   ROS: Per HPI  Allergies  Allergen Reactions   Dofetilide Other (See Comments)    Passed out   Doxycycline     diarrhea    Amlodipine     Other reaction(s): Other (See Comments) Swollen ankles   Dronedarone Other (See Comments)    Increased BG   Glipizide-Metformin Hcl     Other reaction(s): Diarrhea, Other (See Comments) Weakness   Metformin     Other reaction(s): Diarrhea   Niaspan [Niacin] Rash   Past Medical History:  Diagnosis Date   A-fib ([place])    Allergy    seasonal   Anxiety    Arthritis    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    Cataract    Depression 12/12/2015   Dyshydrosis    Hemorrhoids    Hyperlipidemia    Hypertension    Menopause    NIDDM (non-insulin dependent diabetes mellitus)    diet controlled    Osteopenia 2016   Syncope 11/08/2015    Thyroid disease     Current Outpatient Medications:    ACCU-CHEK AVIVA PLUS test strip, CHECK BLOOD SUGAR ONCE A DAY OR AS INSTRUCTED DX E11.9, Disp: 100 strip, Rfl: 3   acetaminophen (TYLENOL) 500 MG tablet, Take by mouth., Disp: , Rfl:    azelastine (ASTELIN) 0.1 % nasal spray, PLACE 1 SPRAY INTO BOTH NOSTRILS 2 (TWO) TIMES DAILY., Disp: 30 mL, Rfl: 4   cholecalciferol (VITAMIN D) 1000 UNITS tablet, Take 1,000 Units by mouth daily., Disp: , Rfl:    diclofenac Sodium (VOLTAREN) 1 % GEL, APPLY 2 GRAMS TOPICALLY 4 (FOUR) TIMES DAILY. (AS NEEDED FOR JOINT PAIN), Disp: 400 g, Rfl: 3   diltiazem (CARDIZEM CD) 120 MG 24 hr capsule, Take 1 capsule (120 mg total) by mouth daily., Disp: 90 capsule, Rfl: 3   fluticasone (FLONASE) 50 MCG/ACT nasal spray, Place 2 sprays into both nostrils daily., Disp: 16 g, Rfl: 6   furosemide (LASIX) 40 MG tablet, Take 1 tablet (40 mg total) by mouth daily., Disp: 90 tablet, Rfl: 3   gabapentin (NEURONTIN) 100 MG capsule, Take 2 capsules (200 mg total) by mouth 3 (three) times daily., Disp: 180 capsule, Rfl: 3   glipiZIDE (GLUCOTROL XL) 5 MG 24 hr tablet, Take 1 tablet (5 mg total) by mouth daily with  breakfast., Disp: 90 tablet, Rfl: 3   hydrocortisone (ANUSOL-HC) 2.5 % rectal cream, PLACE 1 APPLICATION RECTALLY AS NEEDED FOR HEMORRHOID FLARES- MAY USE 7 CONSECUTIVE DAYS, Disp: 30 g, Rfl: 0   levothyroxine (SYNTHROID) 125 MCG tablet, Take 1 tablet (125 mcg total) by mouth daily before breakfast., Disp: 90 tablet, Rfl: 3   lidocaine (LIDODERM) 5 %, Place 1 patch onto the skin daily. Remove & Discard patch within 12 hours or as directed by MD, Disp: 30 patch, Rfl: 3   lidocaine (XYLOCAINE) 2 % jelly, Apply 1 application topically 3 (three) times daily. Apply to hemorrhoid when painful, Disp: 30 mL, Rfl: 1   lisinopril (ZESTRIL) 5 MG tablet, Take 1 tablet (5 mg total) by mouth daily., Disp: 90 tablet, Rfl: 3   magnesium oxide (MAG-OX) 400 MG tablet, 400 mg., Disp: , Rfl:     Potassium Chloride ER 20 MEQ TBCR, TAKE 1 TABLET BY MOUTH EVERY DAY, Disp: 90 tablet, Rfl: 0   PREMARIN vaginal cream, USING APPLICATOR PLACE 1 GRAM IN VAGINA EVERY OTHER NIGHT AS INSTRUCTED, Disp: 30 g, Rfl: 3   rosuvastatin (CRESTOR) 20 MG tablet, Take 1 tablet (20 mg total) by mouth daily., Disp: 90 tablet, Rfl: 3   sitaGLIPtin (JANUVIA) 100 MG tablet, Take 1 tablet (100 mg total) by mouth daily., Disp: 90 tablet, Rfl: 3   terbinafine (LAMISIL) 250 MG tablet, Take 1 tablet (250 mg total) by mouth daily., Disp: 90 tablet, Rfl: 0   TETRAHYDROZOLINE HCL OP, Apply to eye. Reported on 01/02/2016, Disp: , Rfl:    XARELTO 15 MG TABS tablet, TAKE 1 TABLET (15 MG TOTAL) BY MOUTH DAILY WITH SUPPER., Disp: 90 tablet, Rfl: 1 Social History   Socioeconomic History   Marital status: Divorced    Spouse name: Not on file   Number of children: 2   Years of education: 12   Highest education level: High school graduate  Occupational History   Occupation: Retired  Tobacco Use   Smoking status: Never   Smokeless tobacco: Never  Scientific laboratory technician Use: Never used  Substance and Sexual Activity   Alcohol use: No   Drug use: No   Sexual activity: Not Currently  Other Topics Concern   Not on file  Social History Narrative   Lives alone. Children live nearby   Social Determinants of Health   Financial Resource Strain: Low Risk    Difficulty of Paying Living Expenses: Not hard at all  Food Insecurity: No Food Insecurity   Worried About Charity fundraiser in the Last Year: Never true   Arboriculturist in the Last Year: Never true  Transportation Needs: No Transportation Needs   Lack of Transportation (Medical): No   Lack of Transportation (Non-Medical): No  Physical Activity: Inactive   Days of Exercise per Week: 0 days   Minutes of Exercise per Session: 0 min  Stress: No Stress Concern Present   Feeling of Stress : Not at all  Social Connections: Socially Isolated   Frequency of  Communication with Friends and Family: More than three times a week   Frequency of Social Gatherings with Friends and Family: More than three times a week   Attends Religious Services: Never   Marine scientist or Organizations: No   Attends Archivist Meetings: Never   Marital Status: Divorced  Human resources officer Violence: Not At Risk   Fear of Current or Ex-Partner: No   Emotionally Abused: No  Physically Abused: No   Sexually Abused: No   Family History  Problem Relation Age of Onset   Stroke Mother    Diabetes Mother    Heart disease Father    Atrial fibrillation Father    Cancer Sister        ovarian / colon   Diabetes Sister    Heart disease Brother    Hyperlipidemia Brother    Hypertension Brother    Diabetes Brother    Heart attack Brother 96       had to perform emergency surgery   Dementia Brother    Hyperlipidemia Sister    Cancer Sister        liver   Hyperlipidemia Sister    Diabetes Sister        Boarderline DM   Cancer Sister    Diabetes Sister    Diabetes Daughter    Rheum arthritis Daughter    Psoriasis Daughter     Objective: Office vital signs reviewed. BP 137/76    Pulse 90    Temp 97.8 F (36.6 C)    Ht 5\' 4"  (1.626 m)    Wt 162 lb 12.8 oz (73.8 kg)    SpO2 97%    BMI 27.94 kg/m   Physical Examination:  General: Awake, alert, well nourished, No acute distress HEENT: Clear white.  Moist mucous membranes Cardio: regular rate and rhythm, S1S2 heard, no murmurs appreciated Pulm: clear to auscultation bilaterally, no wheezes, rhonchi or rales; normal work of breathing on room air  Assessment/ Plan: 81 y.o. female   Onychomycosis of toenail - Plan: Hepatic function panel, CANCELED: Hepatic function panel  Hypertension associated with diabetes ([place])  Type 2 diabetes mellitus with other specified complication, without long-term current use of insulin (HCC) - Plan: glipiZIDE (GLUCOTROL) 5 MG tablet  Okay to use Lamisil.  We  discussed this really does not have any impact on blood pressure.  Future order for hepatic placed. Shewill return in 6 weeks to have drawn  Blood pressure was under good control during our visit today.  I do think that some of the variants that she seen in her blood pressure may be reflective of her blood pressure medication running out before the 24-hour 3 dose.  I have advised her to check her blood pressure 1 hour after her morning blood pressure medication and 1 hour after her evening blood pressure medication.  I think this will give her more accurate blood pressure readings.  Since she is been having some GI discomfort with the glipizide extended release, we will change it to immediate release.  She knows that she is to replace the glipizide with the new formulation.  We will follow-up at appropriate interval for blood sugar check+  No orders of the defined types were placed in this encounter.  No orders of the defined types were placed in this encounter.    Janora Norlander, DO [place] [phone]

## 2021-08-30 ENCOUNTER — Ambulatory Visit (INDEPENDENT_AMBULATORY_CARE_PROVIDER_SITE_OTHER): Payer: Medicare Other | Admitting: Family Medicine

## 2021-08-30 ENCOUNTER — Encounter: Payer: Self-pay | Admitting: Family Medicine

## 2021-08-30 VITALS — BP 130/73 | HR 94 | Temp 98.1°F | Ht 64.0 in | Wt 161.0 lb

## 2021-08-30 DIAGNOSIS — H6123 Impacted cerumen, bilateral: Secondary | ICD-10-CM | POA: Diagnosis not present

## 2021-08-30 NOTE — Progress Notes (Signed)
Subjective:  Patient ID: Meredith Anderson, female    DOB: 11-20-1940, 81 y.o.   MRN: [id number]  Patient Care Team: Janora Norlander, DO as PCP - General (Family Medicine) Frederik Pear, MD as Consulting Physician (Internal Medicine) Wyvonnia Dusky, MD as Consulting Physician (Cardiology) Griselda Miner, MD as Consulting Physician (Dermatology) Lavera Guise, [facility] (Pharmacist)   Chief Complaint:  Ear Fullness   HPI: Meredith Anderson is a 81 y.o. female presenting on 08/30/2021 for Ear Fullness   Pt presents today for cerumen impaction removal. She has been using Debrox in both ears for the last 2 weeks. She states he has fullness and popping in both ears.   Ear Fullness  There is pain in both ears. This is a recurrent problem. The current episode started 1 to 4 weeks ago. The problem occurs constantly. The problem has been waxing and waning. There has been no fever. The patient is experiencing no pain. She has tried ear drops for the symptoms. The treatment provided no relief.    Relevant past medical, surgical, family, and social history reviewed and updated as indicated.  Allergies and medications reviewed and updated. Data reviewed: Chart in Epic.   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    Cataract    Depression 12/12/2015   Dyshydrosis    Hemorrhoids    Hyperlipidemia    Hypertension    Menopause    NIDDM (non-insulin dependent diabetes mellitus)    diet controlled    Osteopenia 2016   Syncope 11/08/2015   Thyroid disease     Past Surgical History:  Procedure Laterality Date   ABDOMINAL HYSTERECTOMY     partial, prolapse   ABLATION     bunionectomy bilateral     EYE SURGERY Bilateral    cataracts   LUMBAR DISC SURGERY     L4 and L5   PACEMAKER INSERTION  12/14/2015   ROTATOR CUFF REPAIR Right    TUBAL LIGATION      Social History    Socioeconomic History   Marital status: Divorced    Spouse name: Not on file   Number of children: 2   Years of education: 12   Highest education level: High school graduate  Occupational History   Occupation: Retired  Tobacco Use   Smoking status: Never   Smokeless tobacco: Never  Vaping Use   Vaping Use: Never used  Substance and Sexual Activity   Alcohol use: No   Drug use: No   Sexual activity: Not Currently  Other Topics Concern   Not on file  Social History Narrative   Lives alone. Children live nearby   Social Determinants of Health   Financial Resource Strain: Low Risk    Difficulty of Paying Living Expenses: Not hard at all  Food Insecurity: No Food Insecurity   Worried About Charity fundraiser in the Last Year: Never true   Arboriculturist in the Last Year: Never true  Transportation Needs: No Transportation Needs   Lack of Transportation (Medical): No   Lack of Transportation (Non-Medical): No  Physical Activity: Inactive   Days of Exercise per Week: 0 days   Minutes of Exercise per Session: 0 min  Stress: No Stress Concern Present   Feeling of Stress : Not at  all  Social Connections: Socially Isolated   Frequency of Communication with Friends and Family: More than three times a week   Frequency of Social Gatherings with Friends and Family: More than three times a week   Attends Religious Services: Never   Marine scientist or Organizations: No   Attends Music therapist: Never   Marital Status: Divorced  Human resources officer Violence: Not At Risk   Fear of Current or Ex-Partner: No   Emotionally Abused: No   Physically Abused: No   Sexually Abused: No    Outpatient Encounter Medications as of 08/30/2021  Medication Sig   ACCU-CHEK AVIVA PLUS test strip CHECK BLOOD SUGAR ONCE A DAY OR AS INSTRUCTED DX E11.9   acetaminophen (TYLENOL) 500 MG tablet Take by mouth.   azelastine (ASTELIN) 0.1 % nasal spray PLACE 1 SPRAY INTO BOTH NOSTRILS  2 (TWO) TIMES DAILY.   cholecalciferol (VITAMIN D) 1000 UNITS tablet Take 1,000 Units by mouth daily.   diclofenac Sodium (VOLTAREN) 1 % GEL APPLY 2 GRAMS TOPICALLY 4 (FOUR) TIMES DAILY. (AS NEEDED FOR JOINT PAIN)   diltiazem (CARDIZEM CD) 120 MG 24 hr capsule Take 1 capsule (120 mg total) by mouth daily.   fluticasone (FLONASE) 50 MCG/ACT nasal spray Place 2 sprays into both nostrils daily.   furosemide (LASIX) 40 MG tablet Take 1 tablet (40 mg total) by mouth daily.   gabapentin (NEURONTIN) 100 MG capsule Take 2 capsules (200 mg total) by mouth 3 (three) times daily.   glipiZIDE (GLUCOTROL) 5 MG tablet Take 1 tablet (5 mg total) by mouth daily before breakfast. TO REPLACE XL GLIPIZIDE   hydrocortisone (ANUSOL-HC) 2.5 % rectal cream PLACE 1 APPLICATION RECTALLY AS NEEDED FOR HEMORRHOID FLARES- MAY USE 7 CONSECUTIVE DAYS   levothyroxine (SYNTHROID) 125 MCG tablet Take 1 tablet (125 mcg total) by mouth daily before breakfast.   lidocaine (LIDODERM) 5 % Place 1 patch onto the skin daily. Remove & Discard patch within 12 hours or as directed by MD   lidocaine (XYLOCAINE) 2 % jelly Apply 1 application topically 3 (three) times daily. Apply to hemorrhoid when painful   lisinopril (ZESTRIL) 5 MG tablet Take 1 tablet (5 mg total) by mouth daily.   magnesium oxide (MAG-OX) 400 MG tablet 400 mg.   Potassium Chloride ER 20 MEQ TBCR TAKE 1 TABLET BY MOUTH EVERY DAY   PREMARIN vaginal cream USING APPLICATOR PLACE 1 GRAM IN VAGINA EVERY OTHER NIGHT AS INSTRUCTED   rosuvastatin (CRESTOR) 20 MG tablet Take 1 tablet (20 mg total) by mouth daily.   sitaGLIPtin (JANUVIA) 100 MG tablet Take 1 tablet (100 mg total) by mouth daily.   terbinafine (LAMISIL) 250 MG tablet Take 1 tablet (250 mg total) by mouth daily.   TETRAHYDROZOLINE HCL OP Apply to eye. Reported on 01/02/2016   XARELTO 15 MG TABS tablet TAKE 1 TABLET (15 MG TOTAL) BY MOUTH DAILY WITH SUPPER.   No facility-administered encounter medications on file as  of 08/30/2021.    Allergies  Allergen Reactions   Dofetilide Other (See Comments)    Passed out   Doxycycline     diarrhea    Amlodipine     Other reaction(s): Other (See Comments) Swollen ankles   Dronedarone Other (See Comments)    Increased BG   Glipizide-Metformin Hcl     Other reaction(s): Diarrhea, Other (See Comments) Weakness   Metformin     Other reaction(s): Diarrhea   Niaspan [Niacin] Rash    Review of Systems  HENT:         Ear fullness and popping  All other systems reviewed and are negative.      Objective:  BP 130/73    Pulse 94    Temp 98.1 F (36.7 C) (Temporal)    Ht 5' 4"  (1.626 m)    Wt 161 lb (73 kg)    BMI 27.64 kg/m    Wt Readings from Last 3 Encounters:  08/30/21 161 lb (73 kg)  08/16/21 162 lb 12.8 oz (73.8 kg)  06/20/21 166 lb (75.3 kg)    Physical Exam Vitals and nursing note reviewed.  Constitutional:      General: She is not in acute distress.    Appearance: Normal appearance. She is not ill-appearing, toxic-appearing or diaphoretic.  HENT:     Head: Normocephalic and atraumatic.     Right Ear: There is impacted cerumen.     Left Ear: There is impacted cerumen.  Eyes:     Pupils: Pupils are equal, round, and reactive to light.  Cardiovascular:     Rate and Rhythm: Normal rate and regular rhythm.     Heart sounds: Normal heart sounds.  Pulmonary:     Effort: Pulmonary effort is normal.     Breath sounds: Normal breath sounds.  Skin:    General: Skin is warm and dry.     Capillary Refill: Capillary refill takes less than 2 seconds.  Neurological:     General: No focal deficit present.     Mental Status: She is alert and oriented to person, place, and time.  Psychiatric:        Mood and Affect: Mood normal.        Behavior: Behavior normal.    Results for orders placed or performed in visit on 07/04/21  CMP14+EGFR  Result Value Ref Range   Glucose 163 (H) 70 - 99 mg/dL   BUN 16 8 - 27 mg/dL   Creatinine, Ser 1.05 (H)  0.57 - 1.00 mg/dL   eGFR 54 (L) >59 mL/min/1.73   BUN/Creatinine Ratio 15 12 - 28   Sodium 137 134 - 144 mmol/L   Potassium 4.4 3.5 - 5.2 mmol/L   Chloride 96 96 - 106 mmol/L   CO2 24 20 - 29 mmol/L   Calcium 10.5 (H) 8.7 - 10.3 mg/dL   Total Protein 7.8 6.0 - 8.5 g/dL   Albumin 5.1 (H) 3.7 - 4.7 g/dL   Globulin, Total 2.7 1.5 - 4.5 g/dL   Albumin/Globulin Ratio 1.9 1.2 - 2.2   Bilirubin Total 0.6 0.0 - 1.2 mg/dL   Alkaline Phosphatase 136 (H) 44 - 121 IU/L   AST 24 0 - 40 IU/L   ALT 25 0 - 32 IU/L  T4, free  Result Value Ref Range   Free T4 1.90 (H) 0.82 - 1.77 ng/dL  TSH  Result Value Ref Range   TSH 0.533 0.450 - 4.500 uIU/mL  Bayer DCA Hb A1c Waived  Result Value Ref Range   HB A1C (BAYER DCA - WAIVED) 7.9 (H) 4.8 - 5.6 %     Ear Cerumen Removal  Date/Time: 08/30/2021 12:29 PM Performed by: Baruch Gouty, FNP Authorized by: Baruch Gouty, FNP   Anesthesia: Local Anesthetic: none Ceruminolytics applied: Ceruminolytics applied prior to the procedure. Location details: right ear Patient tolerance: patient tolerated the procedure well with no immediate complications Comments: TM normal post procedure Procedure type: irrigation  Sedation: Patient sedated: no    Ear Cerumen Removal  Date/Time: 08/30/2021 12:30 PM Performed by: Baruch Gouty, FNP Authorized by: Baruch Gouty, FNP   Anesthesia: Local Anesthetic: none Ceruminolytics applied: Ceruminolytics applied prior to the procedure. Location details: left ear Patient tolerance: patient tolerated the procedure well with no immediate complications Comments: TM normal post procedure Procedure type: irrigation  Sedation: Patient sedated: no      Pertinent labs & imaging results that were available during my care of the patient were reviewed by me and considered in my medical decision making.  Assessment & Plan:  Adelae was seen today for ear fullness.  Diagnoses and all orders for this  visit:  Bilateral impacted cerumen Bilateral ear irrigation with great results.     Continue all other maintenance medications.  Follow up plan: Return if symptoms worsen or fail to improve.   Continue healthy lifestyle choices, including diet (rich in fruits, vegetables, and lean proteins, and low in salt and simple carbohydrates) and exercise (at least 30 minutes of moderate physical activity daily).  Educational handout given for cerumen removal, earwax build-up  The above assessment and management plan was discussed with the patient. The patient verbalized understanding of and has agreed to the management plan. Patient is aware to call the clinic if they develop any new symptoms or if symptoms persist or worsen. Patient is aware when to return to the clinic for a follow-up visit. Patient educated on when it is appropriate to go to the emergency department.   Monia Pouch, FNP-C [facility] [phone]

## 2021-09-01 ENCOUNTER — Other Ambulatory Visit: Payer: Self-pay | Admitting: Family Medicine

## 2021-09-01 DIAGNOSIS — M159 Polyosteoarthritis, unspecified: Secondary | ICD-10-CM

## 2021-09-02 DIAGNOSIS — M1610 Unilateral primary osteoarthritis, unspecified hip: Secondary | ICD-10-CM | POA: Diagnosis not present

## 2021-09-04 DIAGNOSIS — Z7984 Long term (current) use of oral hypoglycemic drugs: Secondary | ICD-10-CM | POA: Diagnosis not present

## 2021-09-04 DIAGNOSIS — M76892 Other specified enthesopathies of left lower limb, excluding foot: Secondary | ICD-10-CM | POA: Diagnosis not present

## 2021-09-04 DIAGNOSIS — Z888 Allergy status to other drugs, medicaments and biological substances status: Secondary | ICD-10-CM | POA: Diagnosis not present

## 2021-09-04 DIAGNOSIS — Z883 Allergy status to other anti-infective agents status: Secondary | ICD-10-CM | POA: Diagnosis not present

## 2021-09-04 DIAGNOSIS — I878 Other specified disorders of veins: Secondary | ICD-10-CM | POA: Diagnosis not present

## 2021-09-04 DIAGNOSIS — Z950 Presence of cardiac pacemaker: Secondary | ICD-10-CM | POA: Diagnosis not present

## 2021-09-04 DIAGNOSIS — E079 Disorder of thyroid, unspecified: Secondary | ICD-10-CM | POA: Diagnosis not present

## 2021-09-04 DIAGNOSIS — F32A Depression, unspecified: Secondary | ICD-10-CM | POA: Diagnosis not present

## 2021-09-04 DIAGNOSIS — M1612 Unilateral primary osteoarthritis, left hip: Secondary | ICD-10-CM | POA: Diagnosis not present

## 2021-09-04 DIAGNOSIS — I4891 Unspecified atrial fibrillation: Secondary | ICD-10-CM | POA: Diagnosis not present

## 2021-09-04 DIAGNOSIS — M4726 Other spondylosis with radiculopathy, lumbar region: Secondary | ICD-10-CM | POA: Diagnosis not present

## 2021-09-04 DIAGNOSIS — Z791 Long term (current) use of non-steroidal anti-inflammatories (NSAID): Secondary | ICD-10-CM | POA: Diagnosis not present

## 2021-09-04 DIAGNOSIS — E1169 Type 2 diabetes mellitus with other specified complication: Secondary | ICD-10-CM | POA: Diagnosis not present

## 2021-09-04 DIAGNOSIS — Z7901 Long term (current) use of anticoagulants: Secondary | ICD-10-CM | POA: Diagnosis not present

## 2021-09-04 DIAGNOSIS — Z79899 Other long term (current) drug therapy: Secondary | ICD-10-CM | POA: Diagnosis not present

## 2021-09-04 DIAGNOSIS — M5416 Radiculopathy, lumbar region: Secondary | ICD-10-CM | POA: Diagnosis not present

## 2021-09-04 DIAGNOSIS — E785 Hyperlipidemia, unspecified: Secondary | ICD-10-CM | POA: Diagnosis not present

## 2021-09-05 ENCOUNTER — Ambulatory Visit: Payer: Medicare Other | Admitting: Nurse Practitioner

## 2021-09-08 DIAGNOSIS — M25559 Pain in unspecified hip: Secondary | ICD-10-CM | POA: Diagnosis not present

## 2021-09-08 DIAGNOSIS — M5416 Radiculopathy, lumbar region: Secondary | ICD-10-CM | POA: Diagnosis not present

## 2021-09-14 ENCOUNTER — Other Ambulatory Visit: Payer: Self-pay | Admitting: Family Medicine

## 2021-09-14 DIAGNOSIS — G5603 Carpal tunnel syndrome, bilateral upper limbs: Secondary | ICD-10-CM | POA: Diagnosis not present

## 2021-09-22 DIAGNOSIS — G5600 Carpal tunnel syndrome, unspecified upper limb: Secondary | ICD-10-CM | POA: Diagnosis not present

## 2021-09-22 DIAGNOSIS — G5603 Carpal tunnel syndrome, bilateral upper limbs: Secondary | ICD-10-CM | POA: Diagnosis not present

## 2021-09-22 DIAGNOSIS — Z45018 Encounter for adjustment and management of other part of cardiac pacemaker: Secondary | ICD-10-CM | POA: Diagnosis not present

## 2021-09-22 DIAGNOSIS — I480 Paroxysmal atrial fibrillation: Secondary | ICD-10-CM | POA: Diagnosis not present

## 2021-09-22 DIAGNOSIS — M25559 Pain in unspecified hip: Secondary | ICD-10-CM | POA: Diagnosis not present

## 2021-09-22 DIAGNOSIS — I495 Sick sinus syndrome: Secondary | ICD-10-CM | POA: Diagnosis not present

## 2021-09-26 DIAGNOSIS — M25552 Pain in left hip: Secondary | ICD-10-CM | POA: Diagnosis not present

## 2021-09-27 ENCOUNTER — Telehealth: Payer: Self-pay | Admitting: *Deleted

## 2021-09-27 ENCOUNTER — Other Ambulatory Visit: Payer: Medicare Other

## 2021-09-27 NOTE — Telephone Encounter (Signed)
Pt aware of recommendations

## 2021-09-27 NOTE — Telephone Encounter (Signed)
Pt received a steroid shot in hip at [facility] and [place] yesterday around lunch time. She states that they did not discuss with her that her BS would be elevated by this.  ? ?She has been typically running a little higher lately, but this morning after injection yesterday- she was 405. ? ?Current sugar meds: Januvia '100mg'$  QD  and Glipizide 5 mg QD  ? ?She is wondering if she should temporarily increase meds to help bring sugars down. ? ?

## 2021-09-27 NOTE — Telephone Encounter (Signed)
Okay to increase to glipizide 5 mg twice daily if blood sugars are remaining above 300.  Do not expect this to last more than a day or 2 so she should be able to resume her previous dosing thereafter ?

## 2021-10-02 ENCOUNTER — Ambulatory Visit (INDEPENDENT_AMBULATORY_CARE_PROVIDER_SITE_OTHER): Payer: Medicare Other | Admitting: Family

## 2021-10-02 ENCOUNTER — Encounter: Payer: Self-pay | Admitting: Family

## 2021-10-02 VITALS — Temp 97.3°F | Ht 64.0 in

## 2021-10-02 DIAGNOSIS — R42 Dizziness and giddiness: Secondary | ICD-10-CM

## 2021-10-02 DIAGNOSIS — R7309 Other abnormal glucose: Secondary | ICD-10-CM

## 2021-10-02 DIAGNOSIS — E1169 Type 2 diabetes mellitus with other specified complication: Secondary | ICD-10-CM | POA: Diagnosis not present

## 2021-10-02 LAB — BAYER DCA HB A1C WAIVED: HB A1C (BAYER DCA - WAIVED): 9.1 % — ABNORMAL HIGH (ref 4.8–5.6)

## 2021-10-02 MED ORDER — MECLIZINE HCL 50 MG PO TABS: 50.0000 mg | ORAL_TABLET | Freq: Three times a day (TID) | ORAL | 0 refills | Status: DC | PRN

## 2021-10-02 NOTE — Patient Instructions (Signed)
Dizziness Dizziness is a common problem. It is a feeling of unsteadiness or light-headedness. You may feel like you are about to faint. Dizziness can lead to injury if you stumble or fall. Anyone can become dizzy, but dizziness is more common in older adults. This condition can be caused by a number of things, including medicines, dehydration, or illness. Follow these instructions at home: Eating and drinking  Drink enough fluid to keep your urine pale yellow. This helps to keep you from becoming dehydrated. Try to drink more clear fluids, such as water. Do not drink alcohol. Limit your caffeine intake if told to do so by your health care provider. Check ingredients and nutrition facts to see if a food or beverage contains caffeine. Limit your salt (sodium) intake if told to do so by your health care provider. Check ingredients and nutrition facts to see if a food or beverage contains sodium. Activity  Avoid making quick movements. Rise slowly from chairs and steady yourself until you feel okay. In the morning, first sit up on the side of the bed. When you feel okay, stand slowly while you hold onto something until you know that your balance is good. If you need to stand in one place for a long time, move your legs often. Tighten and relax the muscles in your legs while you are standing. Do not drive or use machinery if you feel dizzy. Avoid bending down if you feel dizzy. Place items in your home so that they are easy for you to reach without leaning over. Lifestyle Do not use any products that contain nicotine or tobacco. These products include cigarettes, chewing tobacco, and vaping devices, such as e-cigarettes. If you need help quitting, ask your health care provider. Try to reduce your stress level by using methods such as yoga or meditation. Talk with your health care provider if you need help to manage your stress. General instructions Watch your dizziness for any changes. Take  over-the-counter and prescription medicines only as told by your health care provider. Talk with your health care provider if you think that your dizziness is caused by a medicine that you are taking. Tell a friend or a family member that you are feeling dizzy. If he or she notices any changes in your behavior, have this person call your health care provider. Keep all follow-up visits. This is important. Contact a health care provider if: Your dizziness does not go away or you have new symptoms. Your dizziness or light-headedness gets worse. You feel nauseous. You have reduced hearing. You have a fever. You have neck pain or a stiff neck. Your dizziness leads to an injury or a fall. Get help right away if: You vomit or have diarrhea and are unable to eat or drink anything. You have problems talking, walking, swallowing, or using your arms, hands, or legs. You feel generally weak. You have any bleeding. You are not thinking clearly or you have trouble forming sentences. It may take a friend or family member to notice this. You have chest pain, abdominal pain, shortness of breath, or sweating. Your vision changes or you develop a severe headache. These symptoms may represent a serious problem that is an emergency. Do not wait to see if the symptoms will go away. Get medical help right away. Call your local emergency services (911 in the [place] Do not drive yourself to the hospital. Summary Dizziness is a feeling of unsteadiness or light-headedness. This condition can be caused by a number of   things, including medicines, dehydration, or illness. Anyone can become dizzy, but dizziness is more common in older adults. Drink enough fluid to keep your urine pale yellow. Do not drink alcohol. Avoid making quick movements if you feel dizzy. Monitor your dizziness for any changes. This information is not intended to replace advice given to you by your health care provider. Make sure you discuss any  questions you have with your health care provider. Document Revised: 05/23/2020 Document Reviewed: 05/23/2020 Elsevier Patient Education  2022 Elsevier Inc.  

## 2021-10-02 NOTE — Progress Notes (Signed)
? ?Subjective:  ? ? Patient ID: Meredith Anderson, female    DOB: 07/09/1940, 81 y.o.   MRN: [id number] ? ?Chief Complaint  ?Patient presents with  ? Dizziness  ?  Off and on over a week. Runny nose   ? Blood Sugar Problem  ?  Staying over 300 had steroid shot for over a week it was 247 this morning   ? ?PT presents to the office today with dizziness on and off the last week. She also reports she has had elevated BS after getting a steroid injection left hip pain from her Ortho.  ? ?She reports her glucose usually has been 180's, but since the injection it over 300. Today, her glucose was 247.  ?Dizziness ?This is a new problem. The current episode started 1 to 4 weeks ago. The problem occurs intermittently. The problem has been waxing and waning. The symptoms are aggravated by sneezing and bending. She has tried rest for the symptoms. The treatment provided mild relief.  ?Diabetes ?She presents for her follow-up diabetic visit. She has type 2 diabetes mellitus. Hypoglycemia symptoms include dizziness. Associated symptoms include blurred vision. Pertinent negatives for diabetes include no foot paresthesias. Symptoms are stable. Risk factors for coronary artery disease include diabetes mellitus. Her lunch blood glucose range is generally 180-200 mg/dl.  ? ? ? ?Review of Systems  ?Eyes:  Positive for blurred vision.  ?Neurological:  Positive for dizziness.  ?All other systems reviewed and are negative. ? ?   ?Objective:  ? Physical Exam ?Vitals reviewed.  ?Constitutional:   ?   General: She is not in acute distress. ?   Appearance: She is well-developed.  ?HENT:  ?   Head: Normocephalic and atraumatic.  ?   Right Ear: Tympanic membrane normal.  ?   Left Ear: Tympanic membrane normal.  ?Eyes:  ?   Pupils: Pupils are equal, round, and reactive to light.  ?Neck:  ?   Thyroid: No thyromegaly.  ?Cardiovascular:  ?   Rate and Rhythm: Normal rate and regular rhythm.  ?   Heart sounds: Normal heart sounds. No murmur  heard. ?Pulmonary:  ?   Effort: Pulmonary effort is normal. No respiratory distress.  ?   Breath sounds: Normal breath sounds. No wheezing.  ?Abdominal:  ?   General: Bowel sounds are normal. There is no distension.  ?   Palpations: Abdomen is soft.  ?   Tenderness: There is no abdominal tenderness.  ?Musculoskeletal:     ?   General: No tenderness. Normal range of motion.  ?   Cervical back: Normal range of motion and neck supple.  ?Skin: ?   General: Skin is warm and dry.  ?Neurological:  ?   Mental Status: She is alert and oriented to person, place, and time.  ?   Cranial Nerves: No cranial nerve deficit.  ?   Motor: Weakness present.  ?   Deep Tendon Reflexes: Reflexes are normal and symmetric.  ?Psychiatric:     ?   Behavior: Behavior normal.     ?   Thought Content: Thought content normal.     ?   Judgment: Judgment normal.  ? ? ? ? ?Temp (!) 97.3 ?F (36.3 ?C) (Temporal)   Ht _0  (1.626 m)   BMI 27.64 kg/m?  ? ?   ?Assessment & Plan:  ?Meredith Anderson comes in today with chief complaint of Dizziness (Off and on over a week. Runny nose ) and Blood Sugar Problem (Staying  over 300 had steroid shot for over a week it was 247 this morning ) ? ? ?Diagnosis and orders addressed: ? ?1. Dizziness ?Fall precautions discussed  ?Antivert as needed ?Keep Cardiologists follow up ?Labs pending  ?- CMP14+EGFR ?- CBC with Differential/Platelet ?- Bayer DCA Hb A1c Waived ?- EKG 12-Lead ?- meclizine (ANTIVERT) 50 MG tablet; Take 1 tablet (50 mg total) by mouth 3 (three) times daily as needed.  Dispense: 60 tablet; Refill: 0 ? ?2. Light headed ?- CMP14+EGFR ?- CBC with Differential/Platelet ?- Bayer DCA Hb A1c Waived ?- EKG 12-Lead ? ?3. Elevated glucose ?- CMP14+EGFR ?- CBC with Differential/Platelet ?- Bayer DCA Hb A1c Waived ?- EKG 12-Lead ? ?4. Type 2 diabetes mellitus with other specified complication, without long-term current use of insulin ([place]) ? ? ?Labs pending ?Health Maintenance reviewed ?Diet and exercise  encouraged ? ?Follow up plan: ?Keep chronic follow up with PCP ? ?Evelina Dun, FNP ? ? ? ?

## 2021-10-03 ENCOUNTER — Other Ambulatory Visit: Payer: Self-pay | Admitting: Family

## 2021-10-03 LAB — CMP14+EGFR
ALT: 24 IU/L (ref 0–32)
AST: 15 IU/L (ref 0–40)
Albumin/Globulin Ratio: 1.8 (ref 1.2–2.2)
Albumin: 4.6 g/dL (ref 3.7–4.7)
Alkaline Phosphatase: 99 IU/L (ref 44–121)
BUN/Creatinine Ratio: 23 (ref 12–28)
BUN: 23 mg/dL (ref 8–27)
Bilirubin Total: 0.4 mg/dL (ref 0.0–1.2)
CO2: 22 mmol/L (ref 20–29)
Calcium: 9.7 mg/dL (ref 8.7–10.3)
Chloride: 98 mmol/L (ref 96–106)
Creatinine, Ser: 1.01 mg/dL — ABNORMAL HIGH (ref 0.57–1.00)
Globulin, Total: 2.6 g/dL (ref 1.5–4.5)
Glucose: 234 mg/dL — ABNORMAL HIGH (ref 70–99)
Potassium: 4.1 mmol/L (ref 3.5–5.2)
Sodium: 136 mmol/L (ref 134–144)
Total Protein: 7.2 g/dL (ref 6.0–8.5)
eGFR: 56 mL/min/{1.73_m2} — ABNORMAL LOW (ref >59)

## 2021-10-03 LAB — CBC WITH DIFFERENTIAL/PLATELET
Basophils Absolute: 0 10*3/uL (ref 0.0–0.2)
Basos: 0 %
EOS (ABSOLUTE): 0.1 10*3/uL (ref 0.0–0.4)
Eos: 1 %
Hematocrit: 44.2 % (ref 34.0–46.6)
Hemoglobin: 14.8 g/dL (ref 11.1–15.9)
Immature Grans (Abs): 0.1 10*3/uL (ref 0.0–0.1)
Immature Granulocytes: 1 %
Lymphocytes Absolute: 2 10*3/uL (ref 0.7–3.1)
Lymphs: 22 %
MCH: 30.6 pg (ref 26.6–33.0)
MCHC: 33.5 g/dL (ref 31.5–35.7)
MCV: 91 fL (ref 79–97)
Monocytes Absolute: 0.8 10*3/uL (ref 0.1–0.9)
Monocytes: 9 %
Neutrophils Absolute: 6.2 10*3/uL (ref 1.4–7.0)
Neutrophils: 67 %
Platelets: 254 10*3/uL (ref 150–450)
RBC: 4.84 x10E6/uL (ref 3.77–5.28)
RDW: 13.3 % (ref 11.7–15.4)
WBC: 9.2 10*3/uL (ref 3.4–10.8)

## 2021-10-03 MED ORDER — EMPAGLIFLOZIN 10 MG PO TABS: 10.0000 mg | ORAL_TABLET | Freq: Every day | ORAL | 1 refills | Status: DC

## 2021-10-05 NOTE — Progress Notes (Signed)
Patient calling back about lab work. Please call back.  ?

## 2021-10-18 ENCOUNTER — Encounter: Payer: Self-pay | Admitting: Emergency Medicine

## 2021-10-29 ENCOUNTER — Telehealth: Payer: Self-pay | Admitting: Family

## 2021-10-29 ENCOUNTER — Other Ambulatory Visit: Payer: Self-pay | Admitting: Family Medicine

## 2021-10-29 DIAGNOSIS — R42 Dizziness and giddiness: Secondary | ICD-10-CM

## 2021-10-30 ENCOUNTER — Telehealth: Payer: Self-pay | Admitting: Family Medicine

## 2021-10-30 NOTE — Telephone Encounter (Signed)
Spoke to patient - she does not need a refill on this medication  ?

## 2021-10-30 NOTE — Telephone Encounter (Signed)
Called and lmtcb.

## 2021-10-30 NOTE — Telephone Encounter (Signed)
Last OV 10/03/21 acute . Last RF 08/01/2020. Next OV no appt scheduled  ? ?

## 2021-10-30 NOTE — Telephone Encounter (Signed)
Not intended for long term use.  Is she still dizzy?  If so, I want her to see me ? ?

## 2021-10-30 NOTE — Telephone Encounter (Signed)
Last OV 10/02/21 with [facility]. Last RF 10/02/21. Next OV no upcoming OV scheduled   ?

## 2021-10-30 NOTE — Telephone Encounter (Signed)
Pt aware of labs and scheduled with pcp ? ?

## 2021-10-30 NOTE — Telephone Encounter (Signed)
Pt r/c.

## 2021-10-31 ENCOUNTER — Encounter: Payer: Self-pay | Admitting: Family Medicine

## 2021-10-31 ENCOUNTER — Ambulatory Visit (INDEPENDENT_AMBULATORY_CARE_PROVIDER_SITE_OTHER): Payer: Medicare Other | Admitting: Family Medicine

## 2021-10-31 VITALS — BP 168/87 | HR 91 | Temp 98.2°F | Ht 64.0 in | Wt 164.8 lb

## 2021-10-31 DIAGNOSIS — E1159 Type 2 diabetes mellitus with other circulatory complications: Secondary | ICD-10-CM | POA: Diagnosis not present

## 2021-10-31 DIAGNOSIS — E1169 Type 2 diabetes mellitus with other specified complication: Secondary | ICD-10-CM | POA: Diagnosis not present

## 2021-10-31 DIAGNOSIS — I951 Orthostatic hypotension: Secondary | ICD-10-CM

## 2021-10-31 DIAGNOSIS — I152 Hypertension secondary to endocrine disorders: Secondary | ICD-10-CM

## 2021-10-31 MED ORDER — FUROSEMIDE 40 MG PO TABS: 40.0000 mg | ORAL_TABLET | Freq: Every day | ORAL | 3 refills | Status: DC | PRN

## 2021-10-31 MED ORDER — RIVAROXABAN 15 MG PO TABS: ORAL_TABLET | ORAL | 1 refills | Status: DC

## 2021-10-31 MED ORDER — LISINOPRIL 5 MG PO TABS: 5.0000 mg | ORAL_TABLET | Freq: Every day | ORAL | 3 refills | Status: DC

## 2021-10-31 NOTE — Patient Instructions (Signed)
START jardiance ?STOP glipizide ?RESTART lisinopril ?Monitor sugar. Goal <150 ?Monitor BPressure. Goal <150/90 ?Call for any LOW blood sugars or LOW blood pressure ?Wear compression hose  ? ?

## 2021-10-31 NOTE — Progress Notes (Signed)
? ?Subjective: ?CC: Hyperglycemia ?PCP: Janora Norlander, DO ?Meredith Anderson is a 81 y.o. female presenting to clinic today for: ? ?1.  Hyperglycemia with history of type 2 diabetes/hypertension ?Patient reports compliance with Januvia 100 mg daily and glipizide 5 mg daily.  She has not been utilizing the [place].  Blood sugars have been running 170-180s.  She has been having some elevations in blood pressure as well.  Blood sugar seems to be higher since she had her hip injection and she has not been able to control them since. ? ? ?ROS: Per HPI ? ?Allergies  ?Allergen Reactions  ? Dofetilide Other (See Comments)  ?  Passed out  ? Doxycycline   ?  diarrhea ?  ? Amlodipine   ?  Other reaction(s): Other (See Comments) ?Swollen ankles  ? Dronedarone Other (See Comments)  ?  Increased BG  ? Glipizide-Metformin Hcl   ?  Other reaction(s): Diarrhea, Other (See Comments) ?Weakness  ? Metformin   ?  Other reaction(s): Diarrhea  ? Niaspan [Niacin] Rash  ? ?Past Medical History:  ?Diagnosis Date  ? A-fib ([place])   ? Allergy   ? seasonal  ? Anxiety   ? Arthritis   ? 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 Cataract   ? Depression 12/12/2015  ? Dyshydrosis   ? Hemorrhoids   ? Hyperlipidemia   ? Hypertension   ? Menopause   ? NIDDM (non-insulin dependent diabetes mellitus)   ? diet controlled   ? Osteopenia 2016  ? Syncope 11/08/2015  ? Thyroid disease   ? ? ?Current Outpatient Medications:  ?  ACCU-CHEK AVIVA PLUS test strip, CHECK BLOOD SUGAR ONCE A DAY OR AS INSTRUCTED DX E11.9, Disp: 100 strip, Rfl: 3 ?  acetaminophen (TYLENOL) 500 MG tablet, Take by mouth., Disp: , Rfl:  ?  azelastine (ASTELIN) 0.1 % nasal spray, PLACE 1 SPRAY INTO BOTH NOSTRILS 2 (TWO) TIMES DAILY., Disp: 30 mL, Rfl: 4 ?  cholecalciferol (VITAMIN D) 1000 UNITS tablet, Take 1,000 Units by mouth daily., Disp: , Rfl:  ?  diclofenac Sodium (VOLTAREN) 1 % GEL, APPLY 2 GRAMS TOPICALLY 4 (FOUR) TIMES DAILY. (AS NEEDED FOR JOINT PAIN), Disp: 400 g, Rfl:  3 ?  diltiazem (CARDIZEM CD) 120 MG 24 hr capsule, Take 1 capsule (120 mg total) by mouth daily., Disp: 90 capsule, Rfl: 3 ?  empagliflozin (JARDIANCE) 10 MG TABS tablet, Take 1 tablet (10 mg total) by mouth daily before breakfast., Disp: 90 tablet, Rfl: 1 ?  fluticasone (FLONASE) 50 MCG/ACT nasal spray, Place 2 sprays into both nostrils daily., Disp: 16 g, Rfl: 6 ?  furosemide (LASIX) 40 MG tablet, Take 1 tablet (40 mg total) by mouth daily., Disp: 90 tablet, Rfl: 3 ?  gabapentin (NEURONTIN) 100 MG capsule, Take 2 capsules (200 mg total) by mouth 3 (three) times daily., Disp: 180 capsule, Rfl: 3 ?  glipiZIDE (GLUCOTROL) 5 MG tablet, Take 1 tablet (5 mg total) by mouth daily before breakfast. TO REPLACE XL GLIPIZIDE, Disp: 90 tablet, Rfl: 3 ?  hydrocortisone (ANUSOL-HC) 2.5 % rectal cream, PLACE 1 APPLICATION RECTALLY AS NEEDED FOR HEMORRHOID FLARES- MAY USE 7 CONSECUTIVE DAYS, Disp: 30 g, Rfl: 0 ?  levothyroxine (SYNTHROID) 125 MCG tablet, Take 1 tablet (125 mcg total) by mouth daily before breakfast., Disp: 90 tablet, Rfl: 3 ?  lidocaine (LIDODERM) 5 %, Place 1 patch onto the skin daily. Remove & Discard patch within 12 hours or as directed  by MD, Disp: 30 patch, Rfl: 3 ?  lidocaine (XYLOCAINE) 2 % jelly, Apply 1 application topically 3 (three) times daily. Apply to hemorrhoid when painful, Disp: 30 mL, Rfl: 1 ?  lisinopril (ZESTRIL) 5 MG tablet, Take 1 tablet (5 mg total) by mouth daily., Disp: 90 tablet, Rfl: 3 ?  magnesium oxide (MAG-OX) 400 MG tablet, 400 mg., Disp: , Rfl:  ?  meclizine (ANTIVERT) 50 MG tablet, Take 1 tablet (50 mg total) by mouth 3 (three) times daily as needed., Disp: 60 tablet, Rfl: 0 ?  Potassium Chloride ER 20 MEQ TBCR, TAKE 1 TABLET BY MOUTH EVERY DAY, Disp: 90 tablet, Rfl: 0 ?  PREMARIN vaginal cream, USING APPLICATOR PLACE 1 GRAM IN VAGINA EVERY OTHER NIGHT AS INSTRUCTED, Disp: 30 g, Rfl: 3 ?  rosuvastatin (CRESTOR) 20 MG tablet, Take 1 tablet (20 mg total) by mouth daily., Disp: 90  tablet, Rfl: 3 ?  sitaGLIPtin (JANUVIA) 100 MG tablet, Take 1 tablet (100 mg total) by mouth daily., Disp: 90 tablet, Rfl: 3 ?  terbinafine (LAMISIL) 250 MG tablet, Take 1 tablet (250 mg total) by mouth daily., Disp: 90 tablet, Rfl: 0 ?  TETRAHYDROZOLINE HCL OP, Apply to eye. Reported on 01/02/2016, Disp: , Rfl:  ?  XARELTO 15 MG TABS tablet, TAKE 1 TABLET (15 MG TOTAL) BY MOUTH DAILY WITH SUPPER., Disp: 90 tablet, Rfl: 1 ?Social History  ? ?Socioeconomic History  ? Marital status: Divorced  ?  Spouse name: Not on file  ? Number of children: 2  ? Years of education: 3  ? Highest education level: High school graduate  ?Occupational History  ? Occupation: Retired  ?Tobacco Use  ? Smoking status: Never  ? Smokeless tobacco: Never  ?Vaping Use  ? Vaping Use: Never used  ?Substance and Sexual Activity  ? Alcohol use: No  ? Drug use: No  ? Sexual activity: Not Currently  ?Other Topics Concern  ? Not on file  ?Social History Narrative  ? Lives alone. Children live nearby  ? ?Social Determinants of Health  ? ?Financial Resource Strain: Low Risk   ? Difficulty of Paying Living Expenses: Not hard at all  ?Food Insecurity: No Food Insecurity  ? Worried About Charity fundraiser in the Last Year: Never true  ? Ran Out of Food in the Last Year: Never true  ?Transportation Needs: No Transportation Needs  ? Lack of Transportation (Medical): No  ? Lack of Transportation (Non-Medical): No  ?Physical Activity: Inactive  ? Days of Exercise per Week: 0 days  ? Minutes of Exercise per Session: 0 min  ?Stress: No Stress Concern Present  ? Feeling of Stress : Not at all  ?Social Connections: Socially Isolated  ? Frequency of Communication with Friends and Family: More than three times a week  ? Frequency of Social Gatherings with Friends and Family: More than three times a week  ? Attends Religious Services: Never  ? Active Member of Clubs or Organizations: No  ? Attends Archivist Meetings: Never  ? Marital Status: Divorced   ?Intimate Partner Violence: Not At Risk  ? Fear of Current or Ex-Partner: No  ? Emotionally Abused: No  ? Physically Abused: No  ? Sexually Abused: No  ? ?Family History  ?Problem Relation Age of Onset  ? Stroke Mother   ? Diabetes Mother   ? Heart disease Father   ? Atrial fibrillation Father   ? Cancer Sister   ?     ovarian /  colon  ? Diabetes Sister   ? Heart disease Brother   ? Hyperlipidemia Brother   ? Hypertension Brother   ? Diabetes Brother   ? Heart attack Brother 89  ?     had to perform emergency surgery  ? Dementia Brother   ? Hyperlipidemia Sister   ? Cancer Sister   ?     liver  ? Hyperlipidemia Sister   ? Diabetes Sister   ?     [place]line DM  ? Cancer Sister   ? Diabetes Sister   ? Diabetes Daughter   ? Rheum arthritis Daughter   ? Psoriasis Daughter   ? ? ?Objective: ?Office vital signs reviewed. ?BP (!) 168/87   Pulse 91   Temp 98.2 ?F (36.8 ?C)   Ht '5\' 4"'$  (1.626 m)   Wt 164 lb 12.8 oz (74.8 kg)   SpO2 95%   BMI 28.29 kg/m?  ? ?Physical Examination:  ?General: Awake, alert, nontoxic elderly female, No acute distress ?HEENT: Sclera white ?Cardio: regular rate and rhythm, S1S2 heard, no murmurs appreciated ?Pulm: clear to auscultation bilaterally, no wheezes, rhonchi or rales; normal work of breathing on room air ?Extremities: warm, well perfused, No edema, cyanosis or clubbing; +2 pulses bilaterally ?MSK: Ambulating independently ? ?Assessment/ Plan: ?81 y.o. female  ? ?Type 2 diabetes mellitus with other specified complication, without long-term current use of insulin ([place]) ? ?Hypertension associated with diabetes ([place]) - Plan: furosemide (LASIX) 40 MG tablet ? ?Orthostatic hypotension - Plan: Compression stockings ? ?Sugar not at goal.  Stop glipizide.  Jardiance 10 mg and a month supply has been provided.  I have recommended that she follow-up with CCM for patient assistance if needed.  Would like to see her back in a couple of months for blood sugar rechecks.  We discussed red flag  signs and symptoms. ? ?Blood pressure not at goal.  I like her to restart lisinopril.  Lasix provided for as needed use as well but we discussed that this really should not be her primary method of lowering her bloo

## 2021-11-02 ENCOUNTER — Ambulatory Visit (INDEPENDENT_AMBULATORY_CARE_PROVIDER_SITE_OTHER): Payer: Medicare Other

## 2021-11-02 VITALS — Wt 164.0 lb

## 2021-11-02 DIAGNOSIS — Z0000 Encounter for general adult medical examination without abnormal findings: Secondary | ICD-10-CM | POA: Diagnosis not present

## 2021-11-02 NOTE — Patient Instructions (Signed)
Meredith Anderson , ?Thank you for taking time to come for your Medicare Wellness Visit. I appreciate your ongoing commitment to your health goals. Please review the following plan we discussed and let me know if I can assist you in the future.  ? ?Screening recommendations/referrals: ?Colonoscopy: Done 2011 - no repeat ?Mammogram: Done 2021 - discuss repeat with PCP ?Bone Density: Done 2016 - recommended every 2 years - discuss with PCP ?Recommended yearly ophthalmology/optometry visit for glaucoma screening and checkup ?Recommended yearly dental visit for hygiene and checkup ? ?Vaccinations: ?Influenza vaccine: Done 2022 - Repeat annually ?Pneumococcal vaccine: Done  08/02/2009 & 05/17/2014  ?Tdap vaccine: Done 02/21/2012 - Repeat in 10 years *this year ?Shingles vaccine: Done 02/28/2021 - due for second dose*   ?Covid-19:Done 07/21/2019, 08/18/2019, & 06/16/2020  ? ?Advanced directives: Advance directive discussed with you today. Even though you declined this today, please call our office should you change your mind, and we can give you the proper paperwork for you to fill out.  ? ?Conditions/risks identified: Aim for 30 minutes of exercise or brisk walking, 6-8 glasses of water, and 5 servings of fruits and vegetables each day.  ? ?Next appointment: Follow up in one year for your annual wellness visit  ? ? ?Preventive Care 20 Years and Older, Female ?Preventive care refers to lifestyle choices and visits with your health care provider that can promote health and wellness. ?What does preventive care include? ?A yearly physical exam. This is also called an annual well check. ?Dental exams once or twice a year. ?Routine eye exams. Ask your health care provider how often you should have your eyes checked. ?Personal lifestyle choices, including: ?Daily care of your teeth and gums. ?Regular physical activity. ?Eating a healthy diet. ?Avoiding tobacco and drug use. ?Limiting alcohol use. ?Practicing safe sex. ?Taking low-dose  aspirin every day. ?Taking vitamin and mineral supplements as recommended by your health care provider. ?What happens during an annual well check? ?The services and screenings done by your health care provider during your annual well check will depend on your age, overall health, lifestyle risk factors, and family history of disease. ?Counseling  ?Your health care provider may ask you questions about your: ?Alcohol use. ?Tobacco use. ?Drug use. ?Emotional well-being. ?Home and relationship well-being. ?Sexual activity. ?Eating habits. ?History of falls. ?Memory and ability to understand (cognition). ?Work and work Statistician. ?Reproductive health. ?Screening  ?You may have the following tests or measurements: ?Height, weight, and BMI. ?Blood pressure. ?Lipid and cholesterol levels. These may be checked every 5 years, or more frequently if you are over 90 years old. ?Skin check. ?Lung cancer screening. You may have this screening every year starting at age 40 if you have a 30-pack-year history of smoking and currently smoke or have quit within the past 15 years. ?Fecal occult blood test (FOBT) of the stool. You may have this test every year starting at age 49. ?Flexible sigmoidoscopy or colonoscopy. You may have a sigmoidoscopy every 5 years or a colonoscopy every 10 years starting at age 43. ?Hepatitis C blood test. ?Hepatitis B blood test. ?Sexually transmitted disease (STD) testing. ?Diabetes screening. This is done by checking your blood sugar (glucose) after you have not eaten for a while (fasting). You may have this done every 1-3 years. ?Bone density scan. This is done to screen for osteoporosis. You may have this done starting at age 10. ?Mammogram. This may be done every 1-2 years. Talk to your health care provider about how often you  should have regular mammograms. ?Talk with your health care provider about your test results, treatment options, and if necessary, the need for more tests. ?Vaccines  ?Your  health care provider may recommend certain vaccines, such as: ?Influenza vaccine. This is recommended every year. ?Tetanus, diphtheria, and acellular pertussis (Tdap, Td) vaccine. You may need a Td booster every 10 years. ?Zoster vaccine. You may need this after age 27. ?Pneumococcal 13-valent conjugate (PCV13) vaccine. One dose is recommended after age 39. ?Pneumococcal polysaccharide (PPSV23) vaccine. One dose is recommended after age 13. ?Talk to your health care provider about which screenings and vaccines you need and how often you need them. ?This information is not intended to replace advice given to you by your health care provider. Make sure you discuss any questions you have with your health care provider. ?Document Released: 07/15/2015 Document Revised: 03/07/2016 Document Reviewed: 04/19/2015 ?Elsevier Interactive Patient Education ? 2017 [place]. ? ?Fall Prevention in the Home ?Falls can cause injuries. They can happen to people of all ages. There are many things you can do to make your home safe and to help prevent falls. ?What can I do on the outside of my home? ?Regularly fix the edges of walkways and driveways and fix any cracks. ?Remove anything that might make you trip as you walk through a door, such as a raised step or threshold. ?Trim any bushes or trees on the path to your home. ?Use bright outdoor lighting. ?Clear any walking paths of anything that might make someone trip, such as rocks or tools. ?Regularly check to see if handrails are loose or broken. Make sure that both sides of any steps have handrails. ?Any raised decks and porches should have guardrails on the edges. ?Have any leaves, snow, or ice cleared regularly. ?Use sand or salt on walking paths during winter. ?Clean up any spills in your garage right away. This includes oil or grease spills. ?What can I do in the bathroom? ?Use night lights. ?Install grab bars by the toilet and in the tub and shower. Do not use towel bars as  grab bars. ?Use non-skid mats or decals in the tub or shower. ?If you need to sit down in the shower, use a plastic, non-slip stool. ?Keep the floor dry. Clean up any water that spills on the floor as soon as it happens. ?Remove soap buildup in the tub or shower regularly. ?Attach bath mats securely with double-sided non-slip rug tape. ?Do not have throw rugs and other things on the floor that can make you trip. ?What can I do in the bedroom? ?Use night lights. ?Make sure that you have a light by your bed that is easy to reach. ?Do not use any sheets or blankets that are too big for your bed. They should not hang down onto the floor. ?Have a firm chair that has side arms. You can use this for support while you get dressed. ?Do not have throw rugs and other things on the floor that can make you trip. ?What can I do in the kitchen? ?Clean up any spills right away. ?Avoid walking on wet floors. ?Keep items that you use a lot in easy-to-reach places. ?If you need to reach something above you, use a strong step stool that has a grab bar. ?Keep electrical cords out of the way. ?Do not use floor polish or wax that makes floors slippery. If you must use wax, use non-skid floor wax. ?Do not have throw rugs and other things on the  floor that can make you trip. ?What can I do with my stairs? ?Do not leave any items on the stairs. ?Make sure that there are handrails on both sides of the stairs and use them. Fix handrails that are broken or loose. Make sure that handrails are as long as the stairways. ?Check any carpeting to make sure that it is firmly attached to the stairs. Fix any carpet that is loose or worn. ?Avoid having throw rugs at the top or bottom of the stairs. If you do have throw rugs, attach them to the floor with carpet tape. ?Make sure that you have a light switch at the top of the stairs and the bottom of the stairs. If you do not have them, ask someone to add them for you. ?What else can I do to help prevent  falls? ?Wear shoes that: ?Do not have high heels. ?Have rubber bottoms. ?Are comfortable and fit you well. ?Are closed at the toe. Do not wear sandals. ?If you use a stepladder: ?Make sure that it is fully ope

## 2021-11-02 NOTE — Progress Notes (Signed)
? ?Subjective:  ? Meredith Anderson is a 81 y.o. female who presents for Medicare Annual (Subsequent) preventive examination. ? ?Virtual Visit via Telephone Note ? ?I connected with  Meredith Anderson on 11/02/21 at 10:30 AM EDT by telephone and verified that I am speaking with the correct person using two identifiers. ? ?Location: ?Patient: Home ?Provider: [facility] ?Persons participating in the virtual visit: patient/Nurse Health Advisor ?  ?I discussed the limitations, risks, security and privacy concerns of performing an evaluation and management service by telephone and the availability of in person appointments. The patient expressed understanding and agreed to proceed. ? ?Interactive audio and video telecommunications were attempted between this nurse and patient, however failed, due to patient having technical difficulties OR patient did not have access to video capability.  We continued and completed visit with audio only. ? ?Some vital signs may be absent or patient reported.  ? ?Meredith Anderson Meredith Ano, LPN  ? ?Review of Systems    ? ?Cardiac Risk Factors include: advanced age (>32mn, >>94women);diabetes mellitus;family history of premature cardiovascular disease;hypertension;dyslipidemia;sedentary lifestyle;Other (see comment), Risk factor comments: SSS, S/P pacemaker ? ?   ?Objective:  ?  ?Today's Vitals  ? 11/02/21 1036  ?Weight: 164 lb (74.4 kg)  ? ?Body mass index is 28.15 kg/m?. ? ? ?  11/02/2021  ? 10:41 AM 11/01/2020  ? 11:08 AM 10/29/2019  ?  2:46 PM 10/28/2018  ?  2:42 PM 05/23/2016  ?  3:43 PM 05/20/2015  ?  2:41 PM 05/17/2014  ?  3:34 PM  ?Advanced Directives  ?Does Patient Have a Medical Advance Directive? No No No No No No No  ?Would patient like information on creating a medical advance directive? No - Patient declined No - Patient declined Yes (MAU/Ambulatory/Procedural Areas - Information given) No - Patient declined No - Patient declined No - patient declined information No - patient declined information   ? ? ?Current Medications (verified) ?Outpatient Encounter Medications as of 11/02/2021  ?Medication Sig  ? ACCU-CHEK AVIVA PLUS test strip CHECK BLOOD SUGAR ONCE A DAY OR AS INSTRUCTED DX E11.9  ? acetaminophen (TYLENOL) 500 MG tablet Take by mouth.  ? azelastine (ASTELIN) 0.1 % nasal spray PLACE 1 SPRAY INTO BOTH NOSTRILS 2 (TWO) TIMES DAILY.  ? cholecalciferol (VITAMIN D) 1000 UNITS tablet Take 1,000 Units by mouth daily.  ? diclofenac Sodium (VOLTAREN) 1 % GEL APPLY 2 GRAMS TOPICALLY 4 (FOUR) TIMES DAILY. (AS NEEDED FOR JOINT PAIN)  ? diltiazem (CARDIZEM CD) 120 MG 24 hr capsule Take 1 capsule (120 mg total) by mouth daily.  ? empagliflozin (JARDIANCE) 10 MG TABS tablet Take 1 tablet (10 mg total) by mouth daily before breakfast.  ? fluticasone (FLONASE) 50 MCG/ACT nasal spray Place 2 sprays into both nostrils daily.  ? furosemide (LASIX) 40 MG tablet Take 1 tablet (40 mg total) by mouth daily as needed for fluid or edema.  ? gabapentin (NEURONTIN) 100 MG capsule Take 2 capsules (200 mg total) by mouth 3 (three) times daily.  ? hydrocortisone (ANUSOL-HC) 2.5 % rectal cream PLACE 1 APPLICATION RECTALLY AS NEEDED FOR HEMORRHOID FLARES- MAY USE 7 CONSECUTIVE DAYS  ? levothyroxine (SYNTHROID) 125 MCG tablet Take 1 tablet (125 mcg total) by mouth daily before breakfast.  ? lidocaine (LIDODERM) 5 % Place 1 patch onto the skin daily. Remove & Discard patch within 12 hours or as directed by MD  ? lidocaine (XYLOCAINE) 2 % jelly Apply 1 application topically 3 (three) times daily. Apply to hemorrhoid  when painful  ? lisinopril (ZESTRIL) 5 MG tablet Take 1 tablet (5 mg total) by mouth daily.  ? magnesium oxide (MAG-OX) 400 MG tablet 400 mg.  ? meclizine (ANTIVERT) 50 MG tablet Take 1 tablet (50 mg total) by mouth 3 (three) times daily as needed.  ? Potassium Chloride ER 20 MEQ TBCR TAKE 1 TABLET BY MOUTH EVERY DAY  ? PREMARIN vaginal cream USING APPLICATOR PLACE 1 GRAM IN VAGINA EVERY OTHER NIGHT AS INSTRUCTED  ?  Rivaroxaban (XARELTO) 15 MG TABS tablet TAKE 1 TABLET (15 MG TOTAL) BY MOUTH DAILY WITH SUPPER.  ? rosuvastatin (CRESTOR) 20 MG tablet Take 1 tablet (20 mg total) by mouth daily.  ? sitaGLIPtin (JANUVIA) 100 MG tablet Take 1 tablet (100 mg total) by mouth daily.  ? TETRAHYDROZOLINE HCL OP Apply to eye. Reported on 01/02/2016  ? ?No facility-administered encounter medications on file as of 11/02/2021.  ? ? ?Allergies (verified) ?Dofetilide, Doxycycline, Amlodipine, Dronedarone, Glipizide-metformin hcl, Metformin, and Niaspan [niacin]  ? ?History: ?Past Medical History:  ?Diagnosis Date  ? A-fib ([place])   ? Allergy   ? seasonal  ? Anxiety   ? Arthritis   ? 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 Cataract   ? Depression 12/12/2015  ? Dyshydrosis   ? Hemorrhoids   ? Hyperlipidemia   ? Hypertension   ? Menopause   ? NIDDM (non-insulin dependent diabetes mellitus)   ? diet controlled   ? Osteopenia 2016  ? Syncope 11/08/2015  ? Thyroid disease   ? ?Past Surgical History:  ?Procedure Laterality Date  ? ABDOMINAL HYSTERECTOMY    ? partial, prolapse  ? ABLATION    ? bunionectomy bilateral    ? EYE SURGERY Bilateral   ? cataracts  ? LUMBAR DISC SURGERY    ? L4 and L5  ? PACEMAKER INSERTION  12/14/2015  ? ROTATOR CUFF REPAIR Right   ? TUBAL LIGATION    ? ?Family History  ?Problem Relation Age of Onset  ? Stroke Mother   ? Diabetes Mother   ? Heart disease Father   ? Atrial fibrillation Father   ? Cancer Sister   ?     ovarian / colon  ? Diabetes Sister   ? Heart disease Brother   ? Hyperlipidemia Brother   ? Hypertension Brother   ? Diabetes Brother   ? Heart attack Brother 88  ?     had to perform emergency surgery  ? Dementia Brother   ? Hyperlipidemia Sister   ? Cancer Sister   ?     liver  ? Hyperlipidemia Sister   ? Diabetes Sister   ?     Boarderline DM  ? Cancer Sister   ? Diabetes Sister   ? Diabetes Daughter   ? Rheum arthritis Daughter   ? Psoriasis Daughter   ? ?Social History  ? ?Socioeconomic History  ? Marital  status: Divorced  ?  Spouse name: Not on file  ? Number of children: 2  ? Years of education: 50  ? Highest education level: High school graduate  ?Occupational History  ? Occupation: Retired  ?Tobacco Use  ? Smoking status: Never  ? Smokeless tobacco: Never  ?Vaping Use  ? Vaping Use: Never used  ?Substance and Sexual Activity  ? Alcohol use: No  ? Drug use: No  ? Sexual activity: Not Currently  ?Other Topics Concern  ? Not on file  ?Social History Narrative  ? Lives alone.  Children live nearby  ? ?Social Determinants of Health  ? ?Financial Resource Strain: Low Risk   ? Difficulty of Paying Living Expenses: Not very hard  ?Food Insecurity: No Food Insecurity  ? Worried About Charity fundraiser in the Last Year: Never true  ? Ran Out of Food in the Last Year: Never true  ?Transportation Needs: No Transportation Needs  ? Lack of Transportation (Medical): No  ? Lack of Transportation (Non-Medical): No  ?Physical Activity: Inactive  ? Days of Exercise per Week: 0 days  ? Minutes of Exercise per Session: 0 min  ?Stress: No Stress Concern Present  ? Feeling of Stress : Only a little  ?Social Connections: Moderately Isolated  ? Frequency of Communication with Friends and Family: More than three times a week  ? Frequency of Social Gatherings with Friends and Family: More than three times a week  ? Attends Religious Services: Never  ? Active Member of Clubs or Organizations: Yes  ? Attends Archivist Meetings: More than 4 times per year  ? Marital Status: Divorced  ? ? ?Tobacco Counseling ?Counseling given: Not Answered ? ? ?Clinical Intake: ? ?Pre-visit preparation completed: Yes ? ?Pain : No/denies pain ? ?  ? ?BMI - recorded: 28.15 ?Nutritional Status: BMI 25 -29 Overweight ?Nutritional Risks: None ?Diabetes: Yes ?CBG done?: No ?Did pt. bring in CBG monitor from home?: No ? ?How often do you need to have someone help you when you read instructions, pamphlets, or other written materials from your doctor or  pharmacy?: 1 - Never ? ?Diabetic? Nutrition Risk Assessment: ? ?Has the patient had any N/V/D within the last 2 months?  No  ?Does the patient have any non-healing wounds?  No  ?Has the patient had any unintentional weight loss o

## 2021-11-09 ENCOUNTER — Other Ambulatory Visit: Payer: Self-pay | Admitting: Nurse Practitioner

## 2021-11-09 DIAGNOSIS — J019 Acute sinusitis, unspecified: Secondary | ICD-10-CM

## 2021-11-21 LAB — HM DIABETES EYE EXAM

## 2021-11-28 ENCOUNTER — Telehealth: Payer: Self-pay | Admitting: Family Medicine

## 2021-11-28 NOTE — Telephone Encounter (Signed)
No answer, let pt know samples are rready for pickup

## 2021-12-04 ENCOUNTER — Ambulatory Visit (INDEPENDENT_AMBULATORY_CARE_PROVIDER_SITE_OTHER): Payer: Medicare Other | Admitting: Family Medicine

## 2021-12-04 ENCOUNTER — Encounter: Payer: Self-pay | Admitting: Family Medicine

## 2021-12-04 VITALS — BP 149/84 | HR 89 | Temp 97.6°F | Ht 64.0 in | Wt 161.2 lb

## 2021-12-04 DIAGNOSIS — E034 Atrophy of thyroid (acquired): Secondary | ICD-10-CM

## 2021-12-04 DIAGNOSIS — I152 Hypertension secondary to endocrine disorders: Secondary | ICD-10-CM

## 2021-12-04 DIAGNOSIS — E1159 Type 2 diabetes mellitus with other circulatory complications: Secondary | ICD-10-CM | POA: Diagnosis not present

## 2021-12-04 DIAGNOSIS — E1169 Type 2 diabetes mellitus with other specified complication: Secondary | ICD-10-CM | POA: Diagnosis not present

## 2021-12-04 MED ORDER — HYDROCORTISONE (PERIANAL) 2.5 % EX CREA: TOPICAL_CREAM | CUTANEOUS | 1 refills | Status: DC

## 2021-12-04 MED ORDER — EMPAGLIFLOZIN 25 MG PO TABS: 25.0000 mg | ORAL_TABLET | Freq: Every day | ORAL | 4 refills | Status: DC

## 2021-12-04 NOTE — Progress Notes (Signed)
Subjective: CC:DM PCP: Janora Norlander, DO XFG:HWEX [name] is a 81 y.o. female presenting to clinic today for:  1. Type 2 Diabetes with hypertension, hyperlipidemia:  Blood sugar noted to be elevated last visit so her glipizide was discontinued.  She was started on Jardiance 10 mg daily for her blood sugar and reinitiated on lisinopril for her blood pressure.  She is here for 1 month reevaluation.  She reports that blood sugars have been ranging 115-184.  No hypoglycemic episodes.  Most recent blood sugars ranging typically in the 180s.  She did report that she had slight vaginitis recently but that was relieved by Monistat.  Has not been monitoring blood pressures at home but does report some fatigue.  No chest pain, shortness of breath, dizziness reported but she continues to have foggy headedness since having COVID last year and utilizes a cane for ambulation  Last eye exam: Up-to-date Last foot exam: Up-to-date Last A1c:  Lab Results  Component Value Date   HGBA1C 9.1 (H) 10/02/2021   Nephropathy screen indicated?:  On ACE inhibitor Last flu, zoster and/or pneumovax:  Immunization History  Administered Date(s) Administered   Fluad Quad(high Dose 65+) 03/25/2019, 04/27/2020   Influenza Whole 04/01/2012   Influenza, High Dose Seasonal PF 04/17/2017, 04/16/2018   Influenza, Seasonal, Injecte, Preservative Fre 03/28/2010, 05/29/2011, 04/03/2012   Influenza,inj,Quad PF,6+ Mos 04/03/2013, 04/03/2016   Influenza-Unspecified 03/15/2014   Moderna Sars-Covid-2 Vaccination 07/21/2019, 08/18/2019, 06/16/2020   Pneumococcal Conjugate-13 05/17/2014   Pneumococcal Polysaccharide-23 08/02/2009   Tdap 01/31/2012, 02/21/2012   Zoster Recombinat (Shingrix) 02/28/2021   Zoster, Live 01/31/2012, 02/21/2012    ROS: Per HPI  Allergies  Allergen Reactions   Dofetilide Other (See Comments)    Passed out   Doxycycline     diarrhea    Amlodipine     Other reaction(s): Other (See  Comments) Swollen ankles   Dronedarone Other (See Comments)    Increased BG   Glipizide-Metformin Hcl     Other reaction(s): Diarrhea, Other (See Comments) Weakness   Metformin     Other reaction(s): Diarrhea   Niaspan [Niacin] Rash   Past Medical History:  Diagnosis Date   A-fib ([place])    Allergy    seasonal   Anxiety    Arthritis    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    Cataract    Depression 12/12/2015   Dyshydrosis    Hemorrhoids    Hyperlipidemia    Hypertension    Menopause    NIDDM (non-insulin dependent diabetes mellitus)    diet controlled    Osteopenia 2016   Syncope 11/08/2015   Thyroid disease     Current Outpatient Medications:    ACCU-CHEK AVIVA PLUS test strip, CHECK BLOOD SUGAR ONCE A DAY OR AS INSTRUCTED DX E11.9, Disp: 100 strip, Rfl: 3   acetaminophen (TYLENOL) 500 MG tablet, Take by mouth., Disp: , Rfl:    azelastine (ASTELIN) 0.1 % nasal spray, PLACE 1 SPRAY INTO BOTH NOSTRILS 2 (TWO) TIMES DAILY., Disp: 30 mL, Rfl: 4   cholecalciferol (VITAMIN D) 1000 UNITS tablet, Take 1,000 Units by mouth daily., Disp: , Rfl:    diclofenac Sodium (VOLTAREN) 1 % GEL, APPLY 2 GRAMS TOPICALLY 4 (FOUR) TIMES DAILY. (AS NEEDED FOR JOINT PAIN), Disp: 400 g, Rfl: 3   diltiazem (CARDIZEM CD) 120 MG 24 hr capsule, Take 1 capsule (120 mg total) by mouth daily., Disp: 90 capsule, Rfl: 3   empagliflozin (JARDIANCE) 10 MG TABS  tablet, Take 1 tablet (10 mg total) by mouth daily before breakfast., Disp: 90 tablet, Rfl: 1   fluticasone (FLONASE) 50 MCG/ACT nasal spray, SPRAY 2 SPRAYS INTO EACH NOSTRIL EVERY DAY, Disp: 48 mL, Rfl: 1   furosemide (LASIX) 40 MG tablet, Take 1 tablet (40 mg total) by mouth daily as needed for fluid or edema., Disp: 90 tablet, Rfl: 3   gabapentin (NEURONTIN) 100 MG capsule, Take 2 capsules (200 mg total) by mouth 3 (three) times daily., Disp: 180 capsule, Rfl: 3   hydrocortisone (ANUSOL-HC) 2.5 % rectal cream, PLACE 1 APPLICATION RECTALLY AS NEEDED  FOR HEMORRHOID FLARES- MAY USE 7 CONSECUTIVE DAYS, Disp: 30 g, Rfl: 0   levothyroxine (SYNTHROID) 125 MCG tablet, Take 1 tablet (125 mcg total) by mouth daily before breakfast., Disp: 90 tablet, Rfl: 3   lidocaine (LIDODERM) 5 %, Place 1 patch onto the skin daily. Remove & Discard patch within 12 hours or as directed by MD, Disp: 30 patch, Rfl: 3   lidocaine (XYLOCAINE) 2 % jelly, Apply 1 application topically 3 (three) times daily. Apply to hemorrhoid when painful, Disp: 30 mL, Rfl: 1   lisinopril (ZESTRIL) 5 MG tablet, Take 1 tablet (5 mg total) by mouth daily., Disp: 90 tablet, Rfl: 3   magnesium oxide (MAG-OX) 400 MG tablet, 400 mg., Disp: , Rfl:    meclizine (ANTIVERT) 50 MG tablet, Take 1 tablet (50 mg total) by mouth 3 (three) times daily as needed., Disp: 60 tablet, Rfl: 0   Potassium Chloride ER 20 MEQ TBCR, TAKE 1 TABLET BY MOUTH EVERY DAY, Disp: 90 tablet, Rfl: 0   PREMARIN vaginal cream, USING APPLICATOR PLACE 1 GRAM IN VAGINA EVERY OTHER NIGHT AS INSTRUCTED, Disp: 30 g, Rfl: 3   Rivaroxaban (XARELTO) 15 MG TABS tablet, TAKE 1 TABLET (15 MG TOTAL) BY MOUTH DAILY WITH SUPPER., Disp: 90 tablet, Rfl: 1   rosuvastatin (CRESTOR) 20 MG tablet, Take 1 tablet (20 mg total) by mouth daily., Disp: 90 tablet, Rfl: 3   sitaGLIPtin (JANUVIA) 100 MG tablet, Take 1 tablet (100 mg total) by mouth daily., Disp: 90 tablet, Rfl: 3   TETRAHYDROZOLINE HCL OP, Apply to eye. Reported on 01/02/2016, Disp: , Rfl:  Social History   Socioeconomic History   Marital status: Divorced    Spouse name: Not on file   Number of children: 2   Years of education: 12   Highest education level: High school graduate  Occupational History   Occupation: Retired  Tobacco Use   Smoking status: Never   Smokeless tobacco: Never  Scientific laboratory technician Use: Never used  Substance and Sexual Activity   Alcohol use: No   Drug use: No   Sexual activity: Not Currently  Other Topics Concern   Not on file  Social History  Narrative   Lives alone. Children live nearby   Social Determinants of Health   Financial Resource Strain: Low Risk    Difficulty of Paying Living Expenses: Not very hard  Food Insecurity: No Food Insecurity   Worried About Charity fundraiser in the Last Year: Never true   Ran Out of Food in the Last Year: Never true  Transportation Needs: No Transportation Needs   Lack of Transportation (Medical): No   Lack of Transportation (Non-Medical): No  Physical Activity: Inactive   Days of Exercise per Week: 0 days   Minutes of Exercise per Session: 0 min  Stress: No Stress Concern Present   Feeling of Stress : Only  a little  Social Connections: Moderately Isolated   Frequency of Communication with Friends and Family: More than three times a week   Frequency of Social Gatherings with Friends and Family: More than three times a week   Attends Religious Services: Never   Marine scientist or Organizations: Yes   Attends Music therapist: More than 4 times per year   Marital Status: Divorced  Human resources officer Violence: Not At Risk   Fear of Current or Ex-Partner: No   Emotionally Abused: No   Physically Abused: No   Sexually Abused: No   Family History  Problem Relation Age of Onset   Stroke Mother    Diabetes Mother    Heart disease Father    Atrial fibrillation Father    Cancer Sister        ovarian / colon   Diabetes Sister    Heart disease Brother    Hyperlipidemia Brother    Hypertension Brother    Diabetes Brother    Heart attack Brother 72       had to perform emergency surgery   Dementia Brother    Hyperlipidemia Sister    Cancer Sister        liver   Hyperlipidemia Sister    Diabetes Sister        Boarderline DM   Cancer Sister    Diabetes Sister    Diabetes Daughter    Rheum arthritis Daughter    Psoriasis Daughter     Objective: Office vital signs reviewed. BP (!) 149/84   Pulse 89   Temp 97.6 F (36.4 C)   Ht '5\' 4"'$  (1.626 m)   Wt  161 lb 3.2 oz (73.1 kg)   SpO2 97%   BMI 27.67 kg/m   Physical Examination:  General: Awake, alert, well nourished, No acute distress HEENT: sclera white, MMM.  No exopthalamos.  No goiter Cardio: regular rate and rhythm, S1S2 heard, no murmurs appreciated Pulm: clear to auscultation bilaterally, no wheezes, rhonchi or rales; normal work of breathing on room air MSK: Ambulating with use of cane  Assessment/ Plan: 81 y.o. female   Type 2 diabetes mellitus with other specified complication, without long-term current use of insulin (HCC) - Plan: empagliflozin (JARDIANCE) 25 MG TABS tablet  Hypertension associated with diabetes ([place]) - Plan: Basic Metabolic Panel  Hypothyroidism due to acquired atrophy of thyroid - Plan: TSH, T4, Free  Sugar still on the higher end of normal but I think that this is good to be resulting in a more reasonable A1c given age.  Would make her goal less than 7.9.  We will advance her Jardiance to 25 mg.  3 weeks worth of samples provided.  Rx sent to pharmacy.  Check BMP, TSH and free T4 given reports of some fatigability.  Blood pressure technically in acceptable range for age.  Plan to follow-up in 2 months for A1c check, sooner if concerns arise  No orders of the defined types were placed in this encounter.  No orders of the defined types were placed in this encounter.    Janora Norlander, DO [place] [phone]

## 2021-12-04 NOTE — Patient Instructions (Signed)
Increase the Jardiance to '25mg'$  (ok to use 2 of the '10mg'$  to get close to the dose so you don't waste your pills) Prescription has been sent to the pharmacy to pick up when you run out of the samples I gave you Increase your water intake!

## 2021-12-05 LAB — BASIC METABOLIC PANEL
BUN/Creatinine Ratio: 16 (ref 12–28)
BUN: 16 mg/dL (ref 8–27)
CO2: 21 mmol/L (ref 20–29)
Calcium: 10 mg/dL (ref 8.7–10.3)
Chloride: 100 mmol/L (ref 96–106)
Creatinine, Ser: 1.01 mg/dL — ABNORMAL HIGH (ref 0.57–1.00)
Glucose: 150 mg/dL — ABNORMAL HIGH (ref 70–99)
Potassium: 4.3 mmol/L (ref 3.5–5.2)
Sodium: 137 mmol/L (ref 134–144)
eGFR: 56 mL/min/{1.73_m2} — ABNORMAL LOW (ref >59)

## 2021-12-05 LAB — T4, FREE: Free T4: 1.36 ng/dL (ref 0.82–1.77)

## 2021-12-05 LAB — TSH: TSH: 2.56 u[IU]/mL (ref 0.450–4.500)

## 2021-12-11 ENCOUNTER — Encounter: Payer: Self-pay | Admitting: *Deleted

## 2021-12-22 DIAGNOSIS — I480 Paroxysmal atrial fibrillation: Secondary | ICD-10-CM | POA: Diagnosis not present

## 2021-12-22 DIAGNOSIS — Z950 Presence of cardiac pacemaker: Secondary | ICD-10-CM | POA: Diagnosis not present

## 2021-12-22 DIAGNOSIS — Z7901 Long term (current) use of anticoagulants: Secondary | ICD-10-CM | POA: Diagnosis not present

## 2021-12-22 DIAGNOSIS — I495 Sick sinus syndrome: Secondary | ICD-10-CM | POA: Diagnosis not present

## 2021-12-22 DIAGNOSIS — Z45018 Encounter for adjustment and management of other part of cardiac pacemaker: Secondary | ICD-10-CM | POA: Diagnosis not present

## 2022-01-24 DIAGNOSIS — Z79899 Other long term (current) drug therapy: Secondary | ICD-10-CM | POA: Diagnosis not present

## 2022-01-24 DIAGNOSIS — I10 Essential (primary) hypertension: Secondary | ICD-10-CM | POA: Diagnosis not present

## 2022-01-24 DIAGNOSIS — E7849 Other hyperlipidemia: Secondary | ICD-10-CM | POA: Diagnosis not present

## 2022-01-24 DIAGNOSIS — I483 Typical atrial flutter: Secondary | ICD-10-CM | POA: Diagnosis not present

## 2022-01-24 DIAGNOSIS — I340 Nonrheumatic mitral (valve) insufficiency: Secondary | ICD-10-CM | POA: Diagnosis not present

## 2022-01-25 DIAGNOSIS — R002 Palpitations: Secondary | ICD-10-CM | POA: Diagnosis not present

## 2022-01-25 DIAGNOSIS — I483 Typical atrial flutter: Secondary | ICD-10-CM | POA: Diagnosis not present

## 2022-02-05 ENCOUNTER — Ambulatory Visit: Payer: Medicare Other | Admitting: Family Medicine

## 2022-02-16 ENCOUNTER — Encounter: Payer: Self-pay | Admitting: Family Medicine

## 2022-02-16 ENCOUNTER — Ambulatory Visit (INDEPENDENT_AMBULATORY_CARE_PROVIDER_SITE_OTHER): Payer: Medicare Other | Admitting: Family Medicine

## 2022-02-16 VITALS — BP 150/76 | HR 83 | Temp 97.8°F | Ht 64.0 in | Wt 159.0 lb

## 2022-02-16 DIAGNOSIS — E034 Atrophy of thyroid (acquired): Secondary | ICD-10-CM

## 2022-02-16 DIAGNOSIS — E785 Hyperlipidemia, unspecified: Secondary | ICD-10-CM

## 2022-02-16 DIAGNOSIS — E1169 Type 2 diabetes mellitus with other specified complication: Secondary | ICD-10-CM

## 2022-02-16 DIAGNOSIS — I152 Hypertension secondary to endocrine disorders: Secondary | ICD-10-CM

## 2022-02-16 DIAGNOSIS — E1159 Type 2 diabetes mellitus with other circulatory complications: Secondary | ICD-10-CM

## 2022-02-16 LAB — BAYER DCA HB A1C WAIVED: HB A1C (BAYER DCA - WAIVED): 8.1 % — ABNORMAL HIGH (ref 4.8–5.6)

## 2022-02-16 NOTE — Progress Notes (Signed)
Subjective: CC:DM PCP: Janora Norlander, DO WIO:XBDZ H Forton is a 81 y.o. female presenting to clinic today for:  1. Type 2 Diabetes with hypertension, hyperlipidemia:  Patient was last seen in June.  At that time her sugar was uncontrolled.  We advanced her Jardiance to 25 mg.  She is here for interval follow-up on blood sugar and notes that blood sugars have been running typically 150s to 180s.  No hypoglycemic episodes.  She had a couple of runs of irritation in the vagina but this has been resolved by over-the-counter Monistat.  No dysuria, pelvic discomfort or bleeding.  She has cut back a lot on her carbohydrates but still consumes carbohydrates.  Last eye exam: UTD Last foot exam: UTD Last A1c:  Lab Results  Component Value Date   HGBA1C 9.1 (H) 10/02/2021   Nephropathy screen indicated?: UTD Last flu, zoster and/or pneumovax:  Immunization History  Administered Date(s) Administered   Fluad Quad(high Dose 65+) 03/25/2019, 04/27/2020   Influenza Whole 04/01/2012   Influenza, High Dose Seasonal PF 04/17/2017, 04/16/2018   Influenza, Seasonal, Injecte, Preservative Fre 03/28/2010, 05/29/2011, 04/03/2012   Influenza,inj,Quad PF,6+ Mos 04/03/2013, 04/03/2016   Influenza-Unspecified 03/15/2014   Moderna Sars-Covid-2 Vaccination 07/21/2019, 08/18/2019, 06/16/2020   Pneumococcal Conjugate-13 05/17/2014   Pneumococcal Polysaccharide-23 08/02/2009   Tdap 01/31/2012, 02/21/2012   Zoster Recombinat (Shingrix) 02/28/2021   Zoster, Live 01/31/2012, 02/21/2012    2. Hypothyroidism Thyroid labs were noted to be normal in June.  Compliant with thyroid meds.  No reports of tremor, heart palpitations or changes in bowel habits.   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    Cataract    Depression 12/12/2015   Dyshydrosis    Hemorrhoids    Hyperlipidemia    Hypertension    Menopause    NIDDM (non-insulin dependent diabetes mellitus)    diet controlled    Osteopenia 2016   Syncope 11/08/2015   Thyroid disease     Current Outpatient Medications:    ACCU-CHEK AVIVA PLUS test strip, CHECK BLOOD SUGAR ONCE A DAY OR AS INSTRUCTED DX E11.9, Disp: 100 strip, Rfl: 3   acetaminophen (TYLENOL) 500 MG tablet, Take by mouth., Disp: , Rfl:    azelastine (ASTELIN) 0.1 % nasal spray, PLACE 1 SPRAY INTO BOTH NOSTRILS 2 (TWO) TIMES DAILY., Disp: 30 mL, Rfl: 4   cholecalciferol (VITAMIN D) 1000 UNITS tablet, Take 1,000 Units by mouth daily., Disp: , Rfl:    diclofenac Sodium (VOLTAREN) 1 % GEL, APPLY 2 GRAMS TOPICALLY 4 (FOUR) TIMES DAILY. (AS NEEDED FOR JOINT PAIN), Disp: 400 g, Rfl: 3   diltiazem (CARDIZEM CD) 120 MG 24 hr capsule, Take 1 capsule (120 mg total) by mouth daily., Disp: 90 capsule, Rfl: 3   empagliflozin (JARDIANCE) 25 MG TABS tablet, Take 1 tablet (25 mg total)  by mouth daily before breakfast., Disp: 90 tablet, Rfl: 4   fluticasone (FLONASE) 50 MCG/ACT nasal spray, SPRAY 2 SPRAYS INTO EACH NOSTRIL EVERY DAY, Disp: 48 mL, Rfl: 1   furosemide (LASIX) 40 MG tablet, Take 1 tablet (40 mg total) by mouth daily as needed for fluid or edema., Disp: 90 tablet, Rfl: 3   gabapentin (NEURONTIN) 100 MG capsule, Take 2 capsules (200 mg total) by mouth 3 (three) times daily., Disp: 180 capsule, Rfl: 3   hydrocortisone (ANUSOL-HC) 2.5 % rectal cream, PLACE 1 APPLICATION RECTALLY AS NEEDED FOR HEMORRHOID FLARES- MAY USE 7 CONSECUTIVE DAYS, Disp: 30 g, Rfl: 1    levothyroxine (SYNTHROID) 125 MCG tablet, Take 1 tablet (125 mcg total) by mouth daily before breakfast., Disp: 90 tablet, Rfl: 3   lidocaine (LIDODERM) 5 %, Place 1 patch onto the skin daily. Remove & Discard patch within 12 hours or as directed by MD, Disp: 30 patch, Rfl: 3   lidocaine (XYLOCAINE) 2 % jelly, Apply 1 application topically 3 (three) times daily. Apply to hemorrhoid when painful, Disp: 30 mL, Rfl: 1   lisinopril (ZESTRIL) 5 MG tablet, Take 1 tablet (5 mg total) by mouth daily., Disp: 90 tablet, Rfl: 3   magnesium oxide (MAG-OX) 400 MG tablet, 400 mg., Disp: , Rfl:    meclizine (ANTIVERT) 50 MG tablet, Take 1 tablet (50 mg total) by mouth 3 (three) times daily as needed., Disp: 60 tablet, Rfl: 0   Potassium Chloride ER 20 MEQ TBCR, TAKE 1 TABLET BY MOUTH EVERY DAY, Disp: 90 tablet, Rfl: 0   PREMARIN vaginal cream, USING APPLICATOR PLACE 1 GRAM IN VAGINA EVERY OTHER NIGHT AS INSTRUCTED, Disp: 30 g, Rfl: 3   Rivaroxaban (XARELTO) 15 MG TABS tablet, TAKE 1 TABLET (15 MG TOTAL) BY MOUTH DAILY WITH SUPPER., Disp: 90 tablet, Rfl: 1   rosuvastatin (CRESTOR) 20 MG tablet, Take 1 tablet (20 mg total) by mouth daily., Disp: 90 tablet, Rfl: 3   sitaGLIPtin (JANUVIA) 100 MG tablet, Take 1 tablet (100 mg total) by mouth daily., Disp: 90 tablet, Rfl: 3   TETRAHYDROZOLINE HCL OP, Apply to eye. Reported on 01/02/2016, Disp: , Rfl:  Social History   Socioeconomic History   Marital status: Divorced    Spouse name: Not on file   Number of children: 2   Years of education: 12   Highest education level: High school graduate  Occupational History   Occupation: Retired  Tobacco Use   Smoking status: Never   Smokeless tobacco: Never  Scientific laboratory technician Use: Never used  Substance and Sexual Activity   Alcohol use: No   Drug use: No   Sexual activity: Not Currently  Other Topics Concern   Not on file  Social History Narrative   Lives alone. Children live nearby   Social Determinants of Health    Financial Resource Strain: Low Risk  (11/02/2021)   Overall Financial Resource Strain (CARDIA)    Difficulty of Paying Living Expenses: Not very hard  Food Insecurity: No Food Insecurity (11/02/2021)   Hunger Vital Sign    Worried About Running Out of Food in the Last Year: Never true    Ran Out of Food in the Last Year: Never true  Transportation Needs: No Transportation Needs (11/02/2021)   PRAPARE - Hydrologist (Medical): No    Lack of Transportation (Non-Medical): No  Physical Activity: Inactive (11/02/2021)   Exercise Vital Sign    Days of Exercise per  Week: 0 days    Minutes of Exercise per Session: 0 min  Stress: No Stress Concern Present (11/02/2021)   [place]    Feeling of Stress : Only a little  Social Connections: Moderately Isolated (11/02/2021)   Social Connection and Isolation Panel [NHANES]    Frequency of Communication with Friends and Family: More than three times a week    Frequency of Social Gatherings with Friends and Family: More than three times a week    Attends Religious Services: Never    Marine scientist or Organizations: Yes    Attends Music therapist: More than 4 times per year    Marital Status: Divorced  Intimate Partner Violence: Not At Risk (11/02/2021)   Humiliation, Afraid, Rape, and Kick questionnaire    Fear of Current or Ex-Partner: No    Emotionally Abused: No    Physically Abused: No    Sexually Abused: No   Family History  Problem Relation Age of Onset   Stroke Mother    Diabetes Mother    Heart disease Father    Atrial fibrillation Father    Cancer Sister        ovarian / colon   Diabetes Sister    Heart disease Brother    Hyperlipidemia Brother    Hypertension Brother    Diabetes Brother    Heart attack Brother 49       had to perform emergency surgery   Dementia Brother    Hyperlipidemia Sister    Cancer Sister         liver   Hyperlipidemia Sister    Diabetes Sister        Boarderline DM   Cancer Sister    Diabetes Sister    Diabetes Daughter    Rheum arthritis Daughter    Psoriasis Daughter     Objective: Office vital signs reviewed. BP (!) 150/76   Pulse 83   Temp 97.8 F (36.6 C)   Ht '5\' 4"'$  (1.626 m)   Wt 159 lb (72.1 kg)   SpO2 98%   BMI 27.29 kg/m   Physical Examination:  General: Awake, alert, well nourished, No acute distress HEENT: Sclera white.  Moist mucous membranes Cardio: regular rate and rhythm, S1S2 heard, no murmurs appreciated Pulm: clear to auscultation bilaterally, no wheezes, rhonchi or rales; normal work of breathing on room air Neuro: No tremor  Assessment/ Plan: 81 y.o. female   Type 2 diabetes mellitus with other specified complication, without long-term current use of insulin ([place]) - Plan: Bayer DCA Hb A1c Waived  Hypertension associated with diabetes ([place])  Hyperlipidemia associated with type 2 diabetes mellitus ([place])  Hypothyroidism due to acquired atrophy of thyroid - Plan: TSH, T4, free  Sugar remains uncontrolled but much better than previous checkup.  A1c down to 8.1.  I really think that with some carbohydrate restriction she can get her A1c at 7.9 or below without adding any additional medications.  We discussed that while there are some combo meds, these other meds are likely going to be inferior to what the Januvia and Jardiance are doing for her.  I considered a GLP for her but I worry that this would cause too much GI upset, particularly given her intolerance to medications like metformin.  She is not amenable to injection therapy.  Asymptomatic from a thyroid standpoint  Orders Placed This Encounter  Procedures   Bayer [place] Hb A1c Waived  TSH   T4, free   No orders of the defined types were placed in this encounter.    Janora Norlander, DO [place] [phone]

## 2022-02-16 NOTE — Patient Instructions (Signed)
Follow up with Almyra Free our pharmacist.  She can get the [facility] for you. Rather than add medication, I want you to cut back on sugar/ bread/ pasta/ rice/ potatoes/ sweets

## 2022-02-17 LAB — TSH: TSH: 2.53 u[IU]/mL (ref 0.450–4.500)

## 2022-02-17 LAB — T4, FREE: Free T4: 1.52 ng/dL (ref 0.82–1.77)

## 2022-03-02 ENCOUNTER — Other Ambulatory Visit: Payer: Self-pay | Admitting: Family Medicine

## 2022-03-02 DIAGNOSIS — I10 Essential (primary) hypertension: Secondary | ICD-10-CM

## 2022-03-16 ENCOUNTER — Other Ambulatory Visit: Payer: Self-pay | Admitting: Family Medicine

## 2022-03-16 DIAGNOSIS — M159 Polyosteoarthritis, unspecified: Secondary | ICD-10-CM

## 2022-03-19 NOTE — Progress Notes (Signed)
[facility] Quality Team Note  Name: Meredith Anderson Date of Birth: September 02, 1940 MRN: [id number] Date: 03/19/2022  [facility] Quality Team has reviewed this patient's chart, please see recommendations below:  [facility] Quality Other; (KED: Kidney Health Evaluation Gap- Patient needs Urine Albumin Creatinine Ratio Test completed for gap closure. EGFR has already been completed, Patient has upcoming appointment with [facility] 05/22/2022.)

## 2022-03-23 DIAGNOSIS — I4891 Unspecified atrial fibrillation: Secondary | ICD-10-CM | POA: Diagnosis not present

## 2022-03-23 DIAGNOSIS — Z950 Presence of cardiac pacemaker: Secondary | ICD-10-CM | POA: Diagnosis not present

## 2022-03-23 DIAGNOSIS — I4892 Unspecified atrial flutter: Secondary | ICD-10-CM | POA: Diagnosis not present

## 2022-03-23 DIAGNOSIS — Z7901 Long term (current) use of anticoagulants: Secondary | ICD-10-CM | POA: Diagnosis not present

## 2022-03-23 DIAGNOSIS — Z45018 Encounter for adjustment and management of other part of cardiac pacemaker: Secondary | ICD-10-CM | POA: Diagnosis not present

## 2022-03-23 DIAGNOSIS — I482 Chronic atrial fibrillation, unspecified: Secondary | ICD-10-CM | POA: Diagnosis not present

## 2022-04-09 ENCOUNTER — Other Ambulatory Visit: Payer: Self-pay | Admitting: Family Medicine

## 2022-04-09 DIAGNOSIS — B351 Tinea unguium: Secondary | ICD-10-CM

## 2022-04-09 DIAGNOSIS — G8929 Other chronic pain: Secondary | ICD-10-CM

## 2022-04-09 DIAGNOSIS — I10 Essential (primary) hypertension: Secondary | ICD-10-CM

## 2022-04-10 DIAGNOSIS — E039 Hypothyroidism, unspecified: Secondary | ICD-10-CM | POA: Diagnosis not present

## 2022-04-10 DIAGNOSIS — Z9889 Other specified postprocedural states: Secondary | ICD-10-CM | POA: Diagnosis not present

## 2022-04-10 DIAGNOSIS — I495 Sick sinus syndrome: Secondary | ICD-10-CM | POA: Diagnosis not present

## 2022-04-10 DIAGNOSIS — K219 Gastro-esophageal reflux disease without esophagitis: Secondary | ICD-10-CM | POA: Diagnosis not present

## 2022-04-10 DIAGNOSIS — Z950 Presence of cardiac pacemaker: Secondary | ICD-10-CM | POA: Diagnosis not present

## 2022-04-10 DIAGNOSIS — E119 Type 2 diabetes mellitus without complications: Secondary | ICD-10-CM | POA: Diagnosis not present

## 2022-04-10 DIAGNOSIS — I483 Typical atrial flutter: Secondary | ICD-10-CM | POA: Diagnosis not present

## 2022-04-10 DIAGNOSIS — I4821 Permanent atrial fibrillation: Secondary | ICD-10-CM | POA: Diagnosis not present

## 2022-04-10 DIAGNOSIS — I340 Nonrheumatic mitral (valve) insufficiency: Secondary | ICD-10-CM | POA: Diagnosis not present

## 2022-04-10 DIAGNOSIS — I4892 Unspecified atrial flutter: Secondary | ICD-10-CM | POA: Diagnosis not present

## 2022-04-12 DIAGNOSIS — I4892 Unspecified atrial flutter: Secondary | ICD-10-CM | POA: Diagnosis not present

## 2022-04-12 DIAGNOSIS — I4821 Permanent atrial fibrillation: Secondary | ICD-10-CM | POA: Diagnosis not present

## 2022-04-25 ENCOUNTER — Ambulatory Visit (INDEPENDENT_AMBULATORY_CARE_PROVIDER_SITE_OTHER): Payer: Medicare Other

## 2022-04-25 ENCOUNTER — Encounter: Payer: Self-pay | Admitting: Family Medicine

## 2022-04-25 ENCOUNTER — Ambulatory Visit (INDEPENDENT_AMBULATORY_CARE_PROVIDER_SITE_OTHER): Payer: Medicare Other | Admitting: Family Medicine

## 2022-04-25 VITALS — BP 152/84 | HR 95 | Temp 97.3°F | Ht 64.0 in | Wt 156.4 lb

## 2022-04-25 DIAGNOSIS — M1612 Unilateral primary osteoarthritis, left hip: Secondary | ICD-10-CM | POA: Diagnosis not present

## 2022-04-25 DIAGNOSIS — M25552 Pain in left hip: Secondary | ICD-10-CM | POA: Diagnosis not present

## 2022-04-25 MED ORDER — METHYLPREDNISOLONE ACETATE 40 MG/ML IJ SUSP
40.0000 mg | Freq: Once | INTRAMUSCULAR | Status: AC
  Administered 2022-04-25: 40 mg via INTRAMUSCULAR

## 2022-04-25 MED ORDER — KETOROLAC TROMETHAMINE 30 MG/ML IJ SOLN
30.0000 mg | Freq: Once | INTRAMUSCULAR | Status: AC
  Administered 2022-04-25: 30 mg via INTRAMUSCULAR

## 2022-04-25 NOTE — Addendum Note (Signed)
Addended by: Nigel Berthold C on: 04/25/2022 04:00 PM   Modules accepted: Orders

## 2022-04-25 NOTE — Progress Notes (Signed)
Subjective:  Patient ID: Meredith Anderson, female    DOB: 06-Apr-1941, 81 y.o.   MRN: [id number]  Patient Care Team: Janora Norlander, DO as PCP - General (Family Medicine) Frederik Pear, MD as Consulting Physician (Internal Medicine) Wyvonnia Dusky, MD as Consulting Physician (Cardiology) Griselda Miner, MD as Consulting Physician (Dermatology) Lavera Guise, [facility] (Pharmacist)   Chief Complaint:  Hip Pain (Left hip pain that has been going on a few months. )   HPI: Meredith Anderson is a 81 y.o. female presenting on 04/25/2022 for Hip Pain (Left hip pain that has been going on a few months. )   Hip Pain  Incident onset: 2 months ago. There was no injury mechanism. The pain is present in the left hip. The quality of the pain is described as aching, burning and shooting. The pain is moderate. The pain has been Fluctuating since onset. Pertinent negatives include no inability to bear weight, loss of motion, loss of sensation, muscle weakness, numbness or tingling. The symptoms are aggravated by movement, palpation and weight bearing. She has tried acetaminophen and rest for the symptoms. The treatment provided no relief.     Relevant past medical, surgical, family, and social history reviewed and updated as indicated.  Allergies and medications reviewed and updated. Data reviewed: Chart in Epic.   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    Cataract    Depression 12/12/2015   Dyshydrosis    Hemorrhoids    Hyperlipidemia    Hypertension    Menopause    NIDDM (non-insulin dependent diabetes mellitus)    diet controlled    Osteopenia 2016   Syncope 11/08/2015   Thyroid disease     Past Surgical History:  Procedure Laterality Date   ABDOMINAL HYSTERECTOMY     partial, prolapse   ABLATION     bunionectomy bilateral     EYE SURGERY Bilateral    cataracts    LUMBAR DISC SURGERY     L4 and L5   PACEMAKER INSERTION  12/14/2015   ROTATOR CUFF REPAIR Right    TUBAL LIGATION      Social History   Socioeconomic History   Marital status: Divorced    Spouse name: Not on file   Number of children: 2   Years of education: 12   Highest education level: High school graduate  Occupational History   Occupation: Retired  Tobacco Use   Smoking status: Never   Smokeless tobacco: Never  Vaping Use   Vaping Use: Never used  Substance and Sexual Activity   Alcohol use: No   Drug use: No   Sexual activity: Not Currently  Other Topics Concern   Not on file  Social History Narrative   Lives alone. Children live nearby   Social Determinants of Health   Financial Resource Strain: Low Risk  (11/02/2021)   Overall Financial Resource Strain (CARDIA)    Difficulty of Paying Living Expenses: Not very hard  Food Insecurity: No Food Insecurity (11/02/2021)   Hunger Vital Sign    Worried About Running Out of Food in the Last Year: Never true    Ran Out of Food in the Last Year: Never true  Transportation Needs: No Transportation Needs (11/02/2021)   PRAPARE - Hydrologist (  Medical): No    Lack of Transportation (Non-Medical): No  Physical Activity: Inactive (11/02/2021)   Exercise Vital Sign    Days of Exercise per Week: 0 days    Minutes of Exercise per Session: 0 min  Stress: No Stress Concern Present (11/02/2021)   [place]    Feeling of Stress : Only a little  Social Connections: Moderately Isolated (11/02/2021)   Social Connection and Isolation Panel [NHANES]    Frequency of Communication with Friends and Family: More than three times a week    Frequency of Social Gatherings with Friends and Family: More than three times a week    Attends Religious Services: Never    Marine scientist or Organizations: Yes    Attends Music therapist: More  than 4 times per year    Marital Status: Divorced  Intimate Partner Violence: Not At Risk (11/02/2021)   Humiliation, Afraid, Rape, and Kick questionnaire    Fear of Current or Ex-Partner: No    Emotionally Abused: No    Physically Abused: No    Sexually Abused: No    Outpatient Encounter Medications as of 04/25/2022  Medication Sig   ACCU-CHEK AVIVA PLUS test strip CHECK BLOOD SUGAR ONCE A DAY OR AS INSTRUCTED DX E11.9   acetaminophen (TYLENOL) 500 MG tablet Take by mouth.   azelastine (ASTELIN) 0.1 % nasal spray PLACE 1 SPRAY INTO BOTH NOSTRILS 2 (TWO) TIMES DAILY.   cholecalciferol (VITAMIN D) 1000 UNITS tablet Take 1,000 Units by mouth daily.   diclofenac Sodium (VOLTAREN) 1 % GEL APPLY 2 GRAMS TOPICALLY 4 (FOUR) TIMES DAILY. (AS NEEDED FOR JOINT PAIN)   diltiazem (CARDIZEM CD) 120 MG 24 hr capsule Take 1 capsule (120 mg total) by mouth daily.   empagliflozin (JARDIANCE) 25 MG TABS tablet Take 1 tablet (25 mg total) by mouth daily before breakfast.   fluticasone (FLONASE) 50 MCG/ACT nasal spray SPRAY 2 SPRAYS INTO EACH NOSTRIL EVERY DAY   furosemide (LASIX) 40 MG tablet Take 1 tablet (40 mg total) by mouth daily as needed for fluid or edema.   gabapentin (NEURONTIN) 100 MG capsule Take 2 capsules (200 mg total) by mouth 3 (three) times daily.   hydrocortisone (ANUSOL-HC) 2.5 % rectal cream PLACE 1 APPLICATION RECTALLY AS NEEDED FOR HEMORRHOID FLARES- MAY USE 7 CONSECUTIVE DAYS   levothyroxine (SYNTHROID) 125 MCG tablet Take 1 tablet (125 mcg total) by mouth daily before breakfast.   lidocaine (LIDODERM) 5 % Place 1 patch onto the skin daily. Remove & Discard patch within 12 hours or as directed by MD   lidocaine (XYLOCAINE) 2 % jelly Apply 1 application topically 3 (three) times daily. Apply to hemorrhoid when painful   lisinopril (ZESTRIL) 5 MG tablet Take 1 tablet (5 mg total) by mouth daily.   magnesium oxide (MAG-OX) 400 MG tablet 400 mg.   meclizine (ANTIVERT) 50 MG tablet Take 1  tablet (50 mg total) by mouth 3 (three) times daily as needed.   Potassium Chloride ER 20 MEQ TBCR TAKE 1 TABLET BY MOUTH EVERY DAY   PREMARIN vaginal cream USING APPLICATOR PLACE 1 GRAM IN VAGINA EVERY OTHER NIGHT AS INSTRUCTED   Rivaroxaban (XARELTO) 15 MG TABS tablet TAKE 1 TABLET (15 MG TOTAL) BY MOUTH DAILY WITH SUPPER.   rosuvastatin (CRESTOR) 20 MG tablet Take 1 tablet (20 mg total) by mouth daily.   sitaGLIPtin (JANUVIA) 100 MG tablet Take 1 tablet (100 mg total) by mouth daily.  TETRAHYDROZOLINE HCL OP Apply to eye. Reported on 01/02/2016   No facility-administered encounter medications on file as of 04/25/2022.    Allergies  Allergen Reactions   Dofetilide Other (See Comments)    Passed out   Doxycycline     diarrhea    Amlodipine     Other reaction(s): Other (See Comments) Swollen ankles   Dronedarone Other (See Comments)    Increased BG   Glipizide-Metformin Hcl     Other reaction(s): Diarrhea, Other (See Comments) Weakness   Metformin     Other reaction(s): Diarrhea   Niaspan [Niacin] Rash    Review of Systems  Constitutional:  Positive for activity change. Negative for appetite change, chills, diaphoresis, fatigue, fever and unexpected weight change.  HENT: Negative.    Eyes: Negative.   Respiratory:  Negative for cough, chest tightness and shortness of breath.   Cardiovascular:  Negative for chest pain, palpitations and leg swelling.  Gastrointestinal:  Negative for abdominal pain, blood in stool, constipation, diarrhea, nausea and vomiting.  Endocrine: Negative.   Genitourinary:  Negative for decreased urine volume, difficulty urinating, dysuria, frequency and urgency.  Musculoskeletal:  Positive for arthralgias and gait problem. Negative for back pain, joint swelling, myalgias, neck pain and neck stiffness.  Skin: Negative.   Allergic/Immunologic: Negative.   Neurological:  Negative for dizziness, tingling, weakness, numbness and headaches.  Hematological:  Negative.   Psychiatric/Behavioral:  Negative for confusion, hallucinations, sleep disturbance and suicidal ideas.   All other systems reviewed and are negative.       Objective:  BP (!) 152/84   Pulse 95   Temp (!) 97.3 F (36.3 C) (Temporal)   Ht '5\' 4"'$  (1.626 m)   Wt 156 lb 6.4 oz (70.9 kg)   SpO2 96%   BMI 26.85 kg/m    Wt Readings from Last 3 Encounters:  04/25/22 156 lb 6.4 oz (70.9 kg)  02/16/22 159 lb (72.1 kg)  12/04/21 161 lb 3.2 oz (73.1 kg)    Physical Exam Vitals and nursing note reviewed.  Constitutional:      General: She is not in acute distress.    Appearance: Normal appearance. She is well-developed and well-groomed. She is not ill-appearing, toxic-appearing or diaphoretic.  HENT:     Head: Normocephalic and atraumatic.     Jaw: There is normal jaw occlusion.     Right Ear: Hearing normal.     Left Ear: Hearing normal.     Nose: Nose normal.     Mouth/Throat:     Lips: Pink.     Mouth: Mucous membranes are moist.     Pharynx: Oropharynx is clear. Uvula midline.  Eyes:     General: Lids are normal.     Extraocular Movements: Extraocular movements intact.     Conjunctiva/sclera: Conjunctivae normal.     Pupils: Pupils are equal, round, and reactive to light.  Neck:     Thyroid: No thyroid mass, thyromegaly or thyroid tenderness.     Vascular: No carotid bruit or JVD.     Trachea: Trachea and phonation normal.  Cardiovascular:     Rate and Rhythm: Normal rate. Rhythm irregularly irregular.     Chest Wall: PMI is not displaced.     Pulses: Normal pulses.     Heart sounds: Normal heart sounds. No murmur heard.    No friction rub. No gallop.  Pulmonary:     Effort: Pulmonary effort is normal. No respiratory distress.     Breath sounds: Normal breath sounds.  No wheezing.  Abdominal:     General: Bowel sounds are normal. There is no distension or abdominal bruit.     Palpations: Abdomen is soft. There is no hepatomegaly or splenomegaly.      Tenderness: There is no abdominal tenderness. There is no right CVA tenderness or left CVA tenderness.     Hernia: No hernia is present.  Musculoskeletal:     Cervical back: Normal range of motion and neck supple.     Lumbar back: Normal.     Left hip: Tenderness present. No deformity, lacerations, bony tenderness or crepitus. Decreased range of motion. Normal strength.     Left upper leg: Normal.     Right lower leg: No edema.     Left lower leg: No edema.     Comments: Left hip: decreased abduction and adduction due to discomfort  Lymphadenopathy:     Cervical: No cervical adenopathy.  Skin:    General: Skin is warm and dry.     Capillary Refill: Capillary refill takes less than 2 seconds.     Coloration: Skin is not cyanotic, jaundiced or pale.     Findings: No rash.  Neurological:     General: No focal deficit present.     Mental Status: She is alert and oriented to person, place, and time.     Sensory: Sensation is intact.     Motor: Motor function is intact.     Coordination: Coordination is intact.     Gait: Gait abnormal (antalgic).     Deep Tendon Reflexes: Reflexes are normal and symmetric.  Psychiatric:        Attention and Perception: Attention and perception normal.        Mood and Affect: Mood and affect normal.        Speech: Speech normal.        Behavior: Behavior normal. Behavior is cooperative.        Thought Content: Thought content normal.        Cognition and Memory: Cognition and memory normal.        Judgment: Judgment normal.     Results for orders placed or performed in visit on 02/16/22  Bayer DCA Hb A1c Waived  Result Value Ref Range   HB A1C (BAYER DCA - WAIVED) 8.1 (H) 4.8 - 5.6 %  TSH  Result Value Ref Range   TSH 2.530 0.450 - 4.500 uIU/mL  T4, free  Result Value Ref Range   Free T4 1.52 0.82 - 1.77 ng/dL     X-Ray: left hip: arthritic changes, significant narrowing of joint space, no acute findings. Preliminary x-ray reading by Monia Pouch, FNP-C, [facility].   Pertinent labs & imaging results that were available during my care of the patient were reviewed by me and considered in my medical decision making.  Assessment & Plan:  Treina was seen today for hip pain.  Diagnoses and all orders for this visit:  Pain of left hip Arthritis of left hip Imaging without acute findings, significant arthritic changes. Pt declines referral to ortho today. Due to underlying diabetes and declined renal function, will burst with steroids and Toradol in office. Continue with tylenol at home as needed for pain management. Referral to PT placed. Report new, worsening, or persistent symptoms. Follow up in 6 weeks for reevaluation, sooner if warranted.  -     DG HIP UNILAT W OR W/O PELVIS 2-3 VIEWS LEFT; Future -     Ambulatory referral to Physical Therapy  Continue all other maintenance medications.  Follow up plan: Return in about 6 weeks (around 06/06/2022) for hip pain.   Continue healthy lifestyle choices, including diet (rich in fruits, vegetables, and lean proteins, and low in salt and simple carbohydrates) and exercise (at least 30 minutes of moderate physical activity daily).  Educational handout given for osteoarthritis.   The above assessment and management plan was discussed with the patient. The patient verbalized understanding of and has agreed to the management plan. Patient is aware to call the clinic if they develop any new symptoms or if symptoms persist or worsen. Patient is aware when to return to the clinic for a follow-up visit. Patient educated on when it is appropriate to go to the emergency department.   Monia Pouch, FNP-C [facility] [phone]

## 2022-05-02 ENCOUNTER — Encounter: Payer: Self-pay | Admitting: Physical Therapy

## 2022-05-02 ENCOUNTER — Other Ambulatory Visit: Payer: Self-pay

## 2022-05-02 ENCOUNTER — Ambulatory Visit: Payer: Medicare Other | Attending: Family Medicine | Admitting: Physical Therapy

## 2022-05-02 DIAGNOSIS — M1612 Unilateral primary osteoarthritis, left hip: Secondary | ICD-10-CM | POA: Insufficient documentation

## 2022-05-02 DIAGNOSIS — M25552 Pain in left hip: Secondary | ICD-10-CM | POA: Diagnosis not present

## 2022-05-02 NOTE — Therapy (Signed)
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    Cataract    Depression 12/12/2015   Dyshydrosis    Hemorrhoids    Hyperlipidemia    Hypertension    Menopause    NIDDM (non-insulin dependent diabetes mellitus)    diet controlled    Osteopenia 2016   Syncope 11/08/2015   Thyroid disease    Past Surgical History:  Procedure Laterality Date   ABDOMINAL HYSTERECTOMY     partial, prolapse   ABLATION     bunionectomy bilateral     EYE SURGERY Bilateral    cataracts   LUMBAR DISC SURGERY     L4 and L5   PACEMAKER INSERTION  12/14/2015   ROTATOR CUFF REPAIR Right    TUBAL LIGATION     Patient Active Problem List   Diagnosis Date Noted   Hyperlipidemia associated with type 2 diabetes mellitus ([place]) 11/29/2019   Primary osteoarthritis involving multiple joints 01/22/2018   Hemorrhoid 03/18/2017   Stenosis of cervical spine with myelopathy ([place]) 01/29/2017   SSS (sick sinus syndrome) ([place]) 06/07/2016   Cardiac pacemaker in situ 12/15/2015   Anxiety 12/12/2015   Depression 12/12/2015   Hypothyroidism 08/23/2015   Type 2 diabetes mellitus ([place]) 05/20/2015   Osteopenia, senile 08/11/2014   Uterovaginal prolapse 07/17/2013   Ectopic atrial tachycardia 06/23/2013   Mitral regurgitation 05/12/2013   Pulmonary hypertension ([place]) 05/12/2013   Hypertension associated with diabetes ([place]) 04/03/2013   Sinus pause 11/03/2012   Long term current use of  anticoagulant therapy 11/03/2012   Visual haloes around lights 10/27/2012   Degenerative disc disease, lumbar 02/15/2012   Knowledge deficit on cardioversion 11/27/2011   S/P atrioventricular nodal ablation 07/30/2011     REFERRING PROVIDER: Darla Lesches  REFERRING DIAG: Pain of left hip.  THERAPY DIAG:  Pain in left hip  Rationale for Evaluation and Treatment: Rehabilitation  ONSET DATE: ~3 months.  SUBJECTIVE:   SUBJECTIVE STATEMENT: The patient presents to the clinic today with c/o left hip pain that has been ongoing for about 3 months.  She reports radiation of pain into her buttock and posterior thigh.  She reports no mechanism of injury.  Heat, Tylenol and analgesic rubs can help decrease her pain.  Prolonged standing and walking increase her pain.    PERTINENT HISTORY: Pacemaker, atr-fib, HTN, lumbar surgery (L4-5). PAIN:  Are you having pain? Yes: NPRS scale: 6/10 Pain location: Left hip, left posterior thigh. Pain description: Ache, sore. Aggravating factors: As above. Relieving factors: As above.  PRECAUTIONS: PACEMAKER.   WEIGHT BEARING RESTRICTIONS: No  FALLS:  Has patient fallen in last 6 months? No  LIVING ENVIRONMENT: Lives in: House/apartment Has following equipment at home: Quad cane.  OCCUPATION: Retired.  PLOF: Independent  PATIENT GOALS: Not have pain.   OBJECTIVE:  DIAGNOSTIC FINDINGS: Mild osteoarthritis of the left hip.    PALPATION: Some tenderness over left SIJ, with increased tone over left lumbar musculature and c/o pain over left glut med and posterior to her left greater trochanter.  LOWER EXTREMITY ROM: In supine:  Left hip flexion to 110 degrees and WFL for IR/ER.  LOWER EXTREMITY MMT: Left hip flexion graded at 4 to 4+/5 per contralateral comparison.  LOWER EXTREMITY SPECIAL TESTS:  Left LE longer than right but could be equalized with a left SKTC stretch.  DTR'S:  Bilateral Pateller reflexes are normal and  bilateral Achilles 1+/4+.  GAIT: Safe gait pattern with a QC.  TODAY'S TREATMENT:                                                                                                                              DATE: HMP x 15 minutes.    ASSESSMENT:  CLINICAL IMPRESSION: The patient presents to OPPT with c/o left hip pain that has been ongoing for about 3 months.  She has tenderness over her left LB, SIJ, glut med and posterior to her left greater trochanter.  She c/o radiation of pain into her left posterior thigh.  Her left LE was longer than right due to an apparent left pelvis anterior rotation which was equalized with a SKTC stretch.  Prolonged walking and standing increase her pain.  She walks safely with a QC.  Patient will benefit from skilled physical therapy intervention to address pain and deficits.  OBJECTIVE IMPAIRMENTS: decreased activity tolerance and pain.   ACTIVITY LIMITATIONS: standing and locomotion level  PARTICIPATION LIMITATIONS: cleaning  PERSONAL FACTORS: 1 comorbidity: previous lumbar surgery  are also affecting patient's functional outcome.   REHAB POTENTIAL: Excellent  CLINICAL DECISION MAKING: Stable/uncomplicated  EVALUATION COMPLEXITY: Low   GOALS:  LONG TERM GOALS: Target date: 05/30/2022   Ind with a HEP. Baseline:  Goal status: INITIAL  2.  Stand 20 minutes with pain not > 2/10. Baseline:  Goal status: INITIAL  3.  Eliminate left posterior thigh pain. Baseline:  Goal status: INITIAL  4.  Walk a community distance with pain  not > 2-3/10. Baseline:  Goal status: INITIAL  5.  Perform ADL's with pain not > 3/10. Baseline:  Goal status: INITIAL    PLAN:  PT FREQUENCY: 2x/week  PT DURATION: 4 weeks  PLANNED INTERVENTIONS: Therapeutic exercises, Therapeutic activity, Patient/Family education, Self Care, Dry Needling, Cryotherapy, Moist heat, and Manual therapy  PLAN FOR NEXT SESSION: STW/M, SKTC, dry needling, HMP.   Leyla Soliz,  [place], PT 05/02/2022, 2:02 PM

## 2022-05-07 ENCOUNTER — Encounter: Payer: Self-pay | Admitting: Physical Therapy

## 2022-05-07 ENCOUNTER — Ambulatory Visit: Payer: Medicare Other | Admitting: Physical Therapy

## 2022-05-07 DIAGNOSIS — M1612 Unilateral primary osteoarthritis, left hip: Secondary | ICD-10-CM | POA: Diagnosis not present

## 2022-05-07 DIAGNOSIS — M25552 Pain in left hip: Secondary | ICD-10-CM | POA: Diagnosis not present

## 2022-05-07 NOTE — Therapy (Signed)
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    Cataract    Depression 12/12/2015   Dyshydrosis    Hemorrhoids    Hyperlipidemia    Hypertension    Menopause    NIDDM (non-insulin dependent diabetes mellitus)    diet controlled    Osteopenia 2016   Syncope 11/08/2015   Thyroid disease    Past Surgical History:  Procedure Laterality Date   ABDOMINAL HYSTERECTOMY     partial, prolapse   ABLATION     bunionectomy bilateral     EYE SURGERY Bilateral    cataracts   LUMBAR DISC SURGERY     L4 and L5   PACEMAKER INSERTION  12/14/2015   ROTATOR CUFF REPAIR Right    TUBAL LIGATION     Patient Active Problem List   Diagnosis Date Noted   Hyperlipidemia associated with type 2 diabetes mellitus ([place]) 11/29/2019   Primary osteoarthritis involving multiple joints 01/22/2018   Hemorrhoid 03/18/2017   Stenosis of cervical spine with myelopathy ([place]) 01/29/2017   SSS (sick sinus syndrome) ([place]) 06/07/2016   Cardiac pacemaker in situ 12/15/2015   Anxiety 12/12/2015   Depression 12/12/2015   Hypothyroidism 08/23/2015   Type 2 diabetes mellitus ([place]) 05/20/2015   Osteopenia, senile 08/11/2014   Uterovaginal prolapse 07/17/2013   Ectopic atrial tachycardia 06/23/2013   Mitral regurgitation 05/12/2013   Pulmonary hypertension ([place]) 05/12/2013   Hypertension associated with diabetes ([place]) 04/03/2013    Sinus pause 11/03/2012   Long term current use of anticoagulant therapy 11/03/2012   Visual haloes around lights 10/27/2012   Degenerative disc disease, lumbar 02/15/2012   Knowledge deficit on cardioversion 11/27/2011   S/P atrioventricular nodal ablation 07/30/2011   REFERRING PROVIDER: Darla Lesches  REFERRING DIAG: Pain of left hip.  THERAPY DIAG:  Pain in left hip  Rationale for Evaluation and Treatment: Rehabilitation  ONSET DATE: ~3 months.  SUBJECTIVE:   SUBJECTIVE STATEMENT: Reports increased pain today and took a Tylenol prior to PT.  PERTINENT HISTORY: Pacemaker, atr-fib, HTN, lumbar surgery (L4-5). PAIN:  Are you having pain? Yes: NPRS scale: 5/10 Pain location: Left hip, left posterior thigh. Pain description: Ache, sore. Aggravating factors: As above. Relieving factors: As above.  PRECAUTIONS: PACEMAKER.  PATIENT GOALS: Not have pain.  OBJECTIVE:   DIAGNOSTIC FINDINGS: Mild osteoarthritis of the left hip.  TODAY'S TREATMENT:  EXERCISE LOG  Exercise Repetitions and Resistance Comments  Nustep L3 x16 min                    Blank cell = exercise not performed today   Manual Therapy Soft Tissue Mobilization: L glute, ITB, reduce TP and muscle tone    ASSESSMENT:  CLINICAL IMPRESSION: Patient presented in clinic with reports of increased L buttock pain into LLE. Patient had taken medication prior to PT visit today. Patient reported greater pain as Nustep warm up completed and requested no more Nustep. Patient presented with TP and muscle tone throughout R glute and ITB. Patient reported less tightness following end of treatment session.  OBJECTIVE IMPAIRMENTS: decreased activity tolerance and pain.   ACTIVITY LIMITATIONS: standing and locomotion level  PARTICIPATION LIMITATIONS:  cleaning  PERSONAL FACTORS: 1 comorbidity: previous lumbar surgery  are also affecting patient's functional outcome.   REHAB POTENTIAL: Excellent  CLINICAL DECISION MAKING: Stable/uncomplicated  EVALUATION COMPLEXITY: Low   GOALS:  LONG TERM GOALS: Target date: 06/04/2022   Ind with a HEP. Baseline:  Goal status: INITIAL  2.  Stand 20 minutes with pain not > 2/10. Baseline:  Goal status: INITIAL  3.  Eliminate left posterior thigh pain. Baseline:  Goal status: INITIAL  4.  Walk a community distance with pain  not > 2-3/10. Baseline:  Goal status: INITIAL  5.  Perform ADL's with pain not > 3/10. Baseline:  Goal status: INITIAL    PLAN:  PT FREQUENCY: 2x/week  PT DURATION: 4 weeks  PLANNED INTERVENTIONS: Therapeutic exercises, Therapeutic activity, Patient/Family education, Self Care, Dry Needling, Cryotherapy, Moist heat, and Manual therapy  PLAN FOR NEXT SESSION: STW/M, SKTC, dry needling, HMP.   Standley Brooking, PTA 05/07/2022, 3:15 PM

## 2022-05-09 ENCOUNTER — Encounter: Payer: Self-pay | Admitting: Physical Therapy

## 2022-05-09 ENCOUNTER — Ambulatory Visit: Payer: Medicare Other | Admitting: Physical Therapy

## 2022-05-09 DIAGNOSIS — M25552 Pain in left hip: Secondary | ICD-10-CM

## 2022-05-09 DIAGNOSIS — M1612 Unilateral primary osteoarthritis, left hip: Secondary | ICD-10-CM | POA: Diagnosis not present

## 2022-05-09 NOTE — Therapy (Signed)
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    Cataract    Depression 12/12/2015   Dyshydrosis    Hemorrhoids    Hyperlipidemia    Hypertension    Menopause    NIDDM (non-insulin dependent diabetes mellitus)    diet controlled    Osteopenia 2016   Syncope 11/08/2015   Thyroid disease    Past Surgical History:  Procedure Laterality Date   ABDOMINAL HYSTERECTOMY     partial, prolapse   ABLATION     bunionectomy bilateral     EYE SURGERY Bilateral    cataracts   LUMBAR DISC SURGERY     L4 and L5   PACEMAKER INSERTION  12/14/2015   ROTATOR CUFF REPAIR Right    TUBAL LIGATION     Patient Active Problem List   Diagnosis Date Noted   Hyperlipidemia associated with type 2 diabetes mellitus ([place]) 11/29/2019   Primary osteoarthritis involving multiple joints 01/22/2018   Hemorrhoid 03/18/2017   Stenosis of cervical spine with myelopathy ([place]) 01/29/2017   SSS (sick sinus syndrome) ([place]) 06/07/2016   Cardiac pacemaker in situ 12/15/2015   Anxiety 12/12/2015   Depression 12/12/2015   Hypothyroidism 08/23/2015   Type 2 diabetes mellitus ([place]) 05/20/2015   Osteopenia, senile 08/11/2014   Uterovaginal prolapse 07/17/2013   Ectopic atrial tachycardia 06/23/2013   Mitral regurgitation 05/12/2013   Pulmonary hypertension ([place]) 05/12/2013   Hypertension associated with diabetes ([place]) 04/03/2013    Sinus pause 11/03/2012   Long term current use of anticoagulant therapy 11/03/2012   Visual haloes around lights 10/27/2012   Degenerative disc disease, lumbar 02/15/2012   Knowledge deficit on cardioversion 11/27/2011   S/P atrioventricular nodal ablation 07/30/2011   REFERRING PROVIDER: Darla Lesches  REFERRING DIAG: Pain of left hip.  THERAPY DIAG:  Pain in left hip  Rationale for Evaluation and Treatment: Rehabilitation  ONSET DATE: ~3 months.  SUBJECTIVE:   SUBJECTIVE STATEMENT: About the same.  PERTINENT HISTORY: Pacemaker, atr-fib, HTN, lumbar surgery (L4-5). PAIN:  Are you having pain? Yes: NPRS scale: 5/10 Pain location: Left hip, left posterior thigh. Pain description: Ache, sore. Aggravating factors: As above. Relieving factors: As above.  PRECAUTIONS: PACEMAKER.  PATIENT GOALS: Not have pain.  OBJECTIVE:   DIAGNOSTIC FINDINGS: Mild osteoarthritis of the left hip.  TODAY'S TREATMENT:  Patient in right sdly position with folded pillow between knees for comfort:  Performed STW/M including ischemic release technique x 23 minutes to patient's left lumbar musculature, SIJ, lateral hip and Piriformis musculature f/b HMP x 15 minutes.  ASSESSMENT:  CLINICAL IMPRESSION: Patient about the same upon presentation to the clinic today.  She had notable tone over her left lower lumbar musculature, Piriformis and lateral hip musculature.  She did well with STW/M today.   OBJECTIVE IMPAIRMENTS: decreased activity tolerance and pain.   ACTIVITY LIMITATIONS: standing and locomotion level  PARTICIPATION LIMITATIONS: cleaning  PERSONAL FACTORS: 1 comorbidity: previous lumbar surgery  are also affecting patient's functional outcome.   REHAB POTENTIAL: Excellent  CLINICAL DECISION MAKING:  Stable/uncomplicated  EVALUATION COMPLEXITY: Low   GOALS:  LONG TERM GOALS: Target date: 06/06/2022   Ind with a HEP. Baseline:  Goal status: INITIAL  2.  Stand 20 minutes with pain not > 2/10. Baseline:  Goal status: INITIAL  3.  Eliminate left posterior thigh pain. Baseline:  Goal status: INITIAL  4.  Walk a community distance with pain  not > 2-3/10. Baseline:  Goal status: INITIAL  5.  Perform ADL's with pain not > 3/10. Baseline:  Goal status: INITIAL    PLAN:  PT FREQUENCY: 2x/week  PT DURATION: 4 weeks  PLANNED INTERVENTIONS: Therapeutic exercises, Therapeutic activity, Patient/Family education, Self Care, Dry Needling, Cryotherapy, Moist heat, and Manual therapy  PLAN FOR NEXT SESSION: STW/M, SKTC, dry needling, HMP.   Willy Pinkerton, [place], PT 05/09/2022, 1:44 PM

## 2022-05-15 ENCOUNTER — Encounter: Payer: Self-pay | Admitting: Physical Therapy

## 2022-05-15 ENCOUNTER — Ambulatory Visit: Payer: Medicare Other | Admitting: Physical Therapy

## 2022-05-15 DIAGNOSIS — M1612 Unilateral primary osteoarthritis, left hip: Secondary | ICD-10-CM | POA: Diagnosis not present

## 2022-05-15 DIAGNOSIS — M25552 Pain in left hip: Secondary | ICD-10-CM | POA: Diagnosis not present

## 2022-05-15 NOTE — Therapy (Signed)
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    Cataract    Depression 12/12/2015   Dyshydrosis    Hemorrhoids    Hyperlipidemia    Hypertension    Menopause    NIDDM (non-insulin dependent diabetes mellitus)    diet controlled    Osteopenia 2016   Syncope 11/08/2015   Thyroid disease    Past Surgical History:  Procedure Laterality Date   ABDOMINAL HYSTERECTOMY     partial, prolapse   ABLATION     bunionectomy bilateral     EYE SURGERY Bilateral    cataracts   LUMBAR DISC SURGERY     L4 and L5   PACEMAKER INSERTION  12/14/2015   ROTATOR CUFF REPAIR Right    TUBAL LIGATION     Patient Active Problem List   Diagnosis Date Noted   Hyperlipidemia associated with type 2 diabetes mellitus ([place]) 11/29/2019   Primary osteoarthritis involving multiple joints 01/22/2018   Hemorrhoid 03/18/2017   Stenosis of cervical spine with myelopathy ([place]) 01/29/2017   SSS (sick sinus syndrome) ([place]) 06/07/2016   Cardiac pacemaker in situ 12/15/2015   Anxiety 12/12/2015   Depression 12/12/2015   Hypothyroidism 08/23/2015   Type 2 diabetes mellitus ([place]) 05/20/2015   Osteopenia, senile 08/11/2014   Uterovaginal prolapse 07/17/2013   Ectopic atrial tachycardia 06/23/2013   Mitral regurgitation 05/12/2013   Pulmonary hypertension ([place]) 05/12/2013   Hypertension associated with diabetes ([place])  04/03/2013   Sinus pause 11/03/2012   Long term current use of anticoagulant therapy 11/03/2012   Visual haloes around lights 10/27/2012   Degenerative disc disease, lumbar 02/15/2012   Knowledge deficit on cardioversion 11/27/2011   S/P atrioventricular nodal ablation 07/30/2011   REFERRING PROVIDER: Darla Lesches  REFERRING DIAG: Pain of left hip.  THERAPY DIAG:  Pain in left hip  Rationale for Evaluation and Treatment: Rehabilitation  ONSET DATE: ~3 months.  SUBJECTIVE:   SUBJECTIVE STATEMENT: About the same.  PERTINENT HISTORY: Pacemaker, atr-fib, HTN, lumbar surgery (L4-5). PAIN:  Are you having pain? Yes: NPRS scale: 5/10 Pain location: Left hip, left posterior thigh. Pain description: Ache, sore. Aggravating factors: As above. Relieving factors: As above.  PRECAUTIONS: PACEMAKER.  PATIENT GOALS: Not have pain.  OBJECTIVE:   DIAGNOSTIC FINDINGS: Mild osteoarthritis of the left hip.  TODAY'S TREATMENT:  Manual Therapy  Soft Tissue Mobilization: L SIJ, ITB, glute, to reduce tightness and TP Passive ROM: L SKTC and figure 4 stretch, demonstrate HEP and reduce tightness Manual Traction: LLE, assess symptoms with long axis distraction  Modalities  Date: 11/14 Hot Pack: Hip, 10 mins, Pain  HEP: [id number] ([employer])  ASSESSMENT:  CLINICAL IMPRESSION:  Patient presented in clinic with continued reports of pain in L hip and lateral thigh. Patient states that currently she is limited with standing due to pain. Patient continues to have palpable muscle tightness and TP throughout L glute and ITB musculature.  Patient reported mild tenderness surrounding the L SI joint. Patient also educated regarding new HEP for stretching and clamshell. Patient understanding of instructions. No increased symptoms reported with attempt at long axis  distraction of LLE. Normal moist heat response noted.  OBJECTIVE IMPAIRMENTS: decreased activity tolerance and pain.   ACTIVITY LIMITATIONS: standing and locomotion level  PARTICIPATION LIMITATIONS: cleaning  PERSONAL FACTORS: 1 comorbidity: previous lumbar surgery  are also affecting patient's functional outcome.   REHAB POTENTIAL: Excellent  CLINICAL DECISION MAKING: Stable/uncomplicated  EVALUATION COMPLEXITY: Low   GOALS:  LONG TERM GOALS: Target date: 06/12/2022   Ind with a HEP. Baseline:  Goal status: INITIAL  2.  Stand 20 minutes with pain not > 2/10. Baseline:  Goal status: INITIAL  3.  Eliminate left posterior thigh pain. Baseline:  Goal status: INITIAL  4.  Walk a community distance with pain  not > 2-3/10. Baseline:  Goal status: INITIAL  5.  Perform ADL's with pain not > 3/10. Baseline:  Goal status: INITIAL  PLAN:  PT FREQUENCY: 2x/week  PT DURATION: 4 weeks  PLANNED INTERVENTIONS: Therapeutic exercises, Therapeutic activity, Patient/Family education, Self Care, Dry Needling, Cryotherapy, Moist heat, and Manual therapy  PLAN FOR NEXT SESSION: STW/M, SKTC, dry needling, HMP.   Standley Brooking, PTA 05/15/2022, 1:54 PM

## 2022-05-17 ENCOUNTER — Ambulatory Visit: Payer: Medicare Other | Admitting: Physical Therapy

## 2022-05-17 ENCOUNTER — Encounter: Payer: Self-pay | Admitting: Physical Therapy

## 2022-05-17 DIAGNOSIS — M1612 Unilateral primary osteoarthritis, left hip: Secondary | ICD-10-CM | POA: Diagnosis not present

## 2022-05-17 DIAGNOSIS — M25552 Pain in left hip: Secondary | ICD-10-CM | POA: Diagnosis not present

## 2022-05-17 NOTE — Therapy (Signed)
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    Cataract    Depression 12/12/2015   Dyshydrosis    Hemorrhoids    Hyperlipidemia    Hypertension    Menopause    NIDDM (non-insulin dependent diabetes mellitus)    diet controlled    Osteopenia 2016   Syncope 11/08/2015   Thyroid disease    Past Surgical History:  Procedure Laterality Date   ABDOMINAL HYSTERECTOMY     partial, prolapse   ABLATION     bunionectomy bilateral     EYE SURGERY Bilateral    cataracts   LUMBAR DISC SURGERY     L4 and L5   PACEMAKER INSERTION  12/14/2015   ROTATOR CUFF REPAIR Right    TUBAL LIGATION     Patient Active Problem List   Diagnosis Date Noted   Hyperlipidemia associated with type 2 diabetes mellitus ([place]) 11/29/2019   Primary osteoarthritis involving multiple joints 01/22/2018   Hemorrhoid 03/18/2017   Stenosis of cervical spine with myelopathy ([place]) 01/29/2017   SSS (sick sinus syndrome) ([place]) 06/07/2016   Cardiac pacemaker in situ 12/15/2015   Anxiety 12/12/2015   Depression 12/12/2015   Hypothyroidism 08/23/2015   Type 2 diabetes mellitus ([place]) 05/20/2015   Osteopenia, senile 08/11/2014   Uterovaginal prolapse 07/17/2013   Ectopic atrial tachycardia 06/23/2013   Mitral regurgitation 05/12/2013   Pulmonary hypertension ([place]) 05/12/2013   Hypertension associated with diabetes ([place]) 04/03/2013    Sinus pause 11/03/2012   Long term current use of anticoagulant therapy 11/03/2012   Visual haloes around lights 10/27/2012   Degenerative disc disease, lumbar 02/15/2012   Knowledge deficit on cardioversion 11/27/2011   S/P atrioventricular nodal ablation 07/30/2011   REFERRING PROVIDER: Darla Lesches  REFERRING DIAG: Pain of left hip.  THERAPY DIAG:  Pain in left hip  Rationale for Evaluation and Treatment: Rehabilitation  ONSET DATE: ~3 months.  SUBJECTIVE:   SUBJECTIVE STATEMENT: Reports her leg is about the same. Had more pain yesterday and today. Has used pain creme and taken tylenol.  PERTINENT HISTORY: Pacemaker, atr-fib, HTN, lumbar surgery (L4-5). PAIN:  Are you having pain? Yes: NPRS scale: 4-5/10 Pain location: Left hip, left posterior thigh. Pain description: Ache, sore. Aggravating factors: As above. Relieving factors: As above.  PRECAUTIONS: PACEMAKER.  PATIENT GOALS: Not have pain.  OBJECTIVE:   DIAGNOSTIC FINDINGS: Mild osteoarthritis of the left hip.  TODAY'S TREATMENT:  Manual Therapy Myofascial Release: L piriformis, glute, SI joint, reduce tone and pain    Modalities  Date: 11/16 Hot Pack: Hip, 10 mins, Pain  HEP: [id number] (MedBridge)  ASSESSMENT:  CLINICAL IMPRESSION: Patient presented in clinic with reports of continued L glute pain. Pain is primarily concentrated in the glute area today per patient report. Moderate muscle tightness noted during manual therapy session and denied any soreness surrounding the L SI joint. Normal moist heat response noted following removal of the modality.  OBJECTIVE IMPAIRMENTS: decreased activity tolerance and pain.   ACTIVITY LIMITATIONS: standing and locomotion level  PARTICIPATION LIMITATIONS: cleaning  PERSONAL FACTORS: 1 comorbidity: previous lumbar surgery  are also  affecting patient's functional outcome.   REHAB POTENTIAL: Excellent  CLINICAL DECISION MAKING: Stable/uncomplicated  EVALUATION COMPLEXITY: Low   GOALS:  LONG TERM GOALS: Target date: 06/14/2022   Ind with a HEP. Baseline:  Goal status: INITIAL  2.  Stand 20 minutes with pain not > 2/10. Baseline:  Goal status: INITIAL  3.  Eliminate left posterior thigh pain. Baseline:  Goal status: INITIAL  4.  Walk a community distance with pain  not > 2-3/10. Baseline:  Goal status: INITIAL  5.  Perform ADL's with pain not > 3/10. Baseline:  Goal status: INITIAL  PLAN:  PT FREQUENCY: 2x/week  PT DURATION: 4 weeks  PLANNED INTERVENTIONS: Therapeutic exercises, Therapeutic activity, Patient/Family education, Self Care, Dry Needling, Cryotherapy, Moist heat, and Manual therapy  PLAN FOR NEXT SESSION: STW/M, SKTC, dry needling, HMP.   Standley Brooking, PTA 05/17/2022, 2:30 PM

## 2022-05-20 ENCOUNTER — Other Ambulatory Visit: Payer: Self-pay | Admitting: Family Medicine

## 2022-05-20 DIAGNOSIS — E1169 Type 2 diabetes mellitus with other specified complication: Secondary | ICD-10-CM

## 2022-05-20 DIAGNOSIS — E034 Atrophy of thyroid (acquired): Secondary | ICD-10-CM

## 2022-05-20 DIAGNOSIS — G8929 Other chronic pain: Secondary | ICD-10-CM

## 2022-05-22 ENCOUNTER — Ambulatory Visit (INDEPENDENT_AMBULATORY_CARE_PROVIDER_SITE_OTHER): Payer: Medicare Other | Admitting: Family Medicine

## 2022-05-22 ENCOUNTER — Ambulatory Visit (INDEPENDENT_AMBULATORY_CARE_PROVIDER_SITE_OTHER): Payer: Medicare Other

## 2022-05-22 ENCOUNTER — Encounter: Payer: Self-pay | Admitting: Family Medicine

## 2022-05-22 VITALS — BP 138/78 | HR 85 | Temp 98.2°F | Ht 64.0 in | Wt 153.2 lb

## 2022-05-22 DIAGNOSIS — E1159 Type 2 diabetes mellitus with other circulatory complications: Secondary | ICD-10-CM | POA: Diagnosis not present

## 2022-05-22 DIAGNOSIS — E1169 Type 2 diabetes mellitus with other specified complication: Secondary | ICD-10-CM | POA: Diagnosis not present

## 2022-05-22 DIAGNOSIS — I152 Hypertension secondary to endocrine disorders: Secondary | ICD-10-CM

## 2022-05-22 DIAGNOSIS — E785 Hyperlipidemia, unspecified: Secondary | ICD-10-CM

## 2022-05-22 DIAGNOSIS — Z780 Asymptomatic menopausal state: Secondary | ICD-10-CM | POA: Diagnosis not present

## 2022-05-22 DIAGNOSIS — M1612 Unilateral primary osteoarthritis, left hip: Secondary | ICD-10-CM | POA: Diagnosis not present

## 2022-05-22 DIAGNOSIS — Z23 Encounter for immunization: Secondary | ICD-10-CM | POA: Diagnosis not present

## 2022-05-22 DIAGNOSIS — N1831 Chronic kidney disease, stage 3a: Secondary | ICD-10-CM

## 2022-05-22 DIAGNOSIS — M85852 Other specified disorders of bone density and structure, left thigh: Secondary | ICD-10-CM | POA: Diagnosis not present

## 2022-05-22 DIAGNOSIS — E1122 Type 2 diabetes mellitus with diabetic chronic kidney disease: Secondary | ICD-10-CM | POA: Diagnosis not present

## 2022-05-22 DIAGNOSIS — M85851 Other specified disorders of bone density and structure, right thigh: Secondary | ICD-10-CM | POA: Diagnosis not present

## 2022-05-22 LAB — BAYER DCA HB A1C WAIVED: HB A1C (BAYER DCA - WAIVED): 7.8 % — ABNORMAL HIGH (ref 4.8–5.6)

## 2022-05-22 MED ORDER — DAPAGLIFLOZIN PROPANEDIOL 10 MG PO TABS: 10.0000 mg | ORAL_TABLET | Freq: Every day | ORAL | 3 refills | Status: DC

## 2022-05-22 NOTE — Patient Instructions (Signed)
STOP Jardiance START Farxiga HOLD Rosuvastatin for 2 days.  If muscle pain improves, then the pain is statin induced. If not, it's from your back and you may need to see the spinal specialist again - Ok to add Coenzyme Q10 to see if this helps with muscle pain related to statin use. - IF pain does not improve with the CO-Q10, then we can change the rosuvastatin to Zetia or Nexletol.  Let me know if this is needed in 1 month

## 2022-05-22 NOTE — Progress Notes (Signed)
Subjective: CC:DM PCP: Janora Norlander, DO IOE:VOJJ Meredith Anderson is a 81 y.o. female presenting to clinic today for:  1. Type 2 Diabetes with hypertension, hyperlipidemia w/ CKD3a:  She is compliant with Jardiance and Januvia.  Compliant with ACE inhibitor and statin but notes that she thinks that she is having some medication induced muscle pain in that left low back/hip.  She has been in physical therapy for suspected arthritis and they in fact thought it was coming from her back.  She has history of L4 and L5 disc disease status post surgery over 10 years ago in [place] but she cannot recall the Psychologist, sport and exercise.  She does report that the pain is somewhat better with physical therapy so she does not want to see a surgeon again just yet.  She wonders if switching off of her current statin would help symptoms. 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  Last eye exam: UTD Last foot exam: needs Last A1c:  Lab Results  Component Value Date   HGBA1C 8.1 (Meredith) 02/16/2022   Nephropathy screen indicated?: needs Last flu, zoster and/or pneumovax:  Immunization History  Administered Date(s) Administered   Fluad Quad(high Dose 65+) 03/25/2019, 04/27/2020   Influenza Whole 04/01/2012   Influenza, High Dose Seasonal PF 04/17/2017, 04/16/2018   Influenza, Seasonal, Injecte, Preservative Fre 03/28/2010, 05/29/2011, 04/03/2012   Influenza,inj,Quad PF,6+ Mos 04/03/2013, 04/03/2016   Influenza-Unspecified 03/15/2014   Moderna Sars-Covid-2 Vaccination 07/21/2019, 08/18/2019, 06/16/2020   Pneumococcal Conjugate-13 05/17/2014   Pneumococcal Polysaccharide-23 08/02/2009   Tdap 01/31/2012, 02/21/2012   Zoster Recombinat (Shingrix) 02/28/2021   Zoster, Live 01/31/2012, 02/21/2012    ROS: No CP, SOB.  No blurred vision.  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    Cataract    Depression 12/12/2015   Dyshydrosis    Hemorrhoids    Hyperlipidemia    Hypertension    Menopause    NIDDM (non-insulin dependent diabetes mellitus)    diet controlled    Osteopenia 2016   Syncope 11/08/2015   Thyroid disease     Current Outpatient Medications:    ACCU-CHEK AVIVA PLUS test strip, CHECK BLOOD SUGAR ONCE A DAY OR AS INSTRUCTED DX E11.9, Disp: 100 strip, Rfl: 3   acetaminophen (TYLENOL) 500 MG tablet, Take by mouth., Disp: , Rfl:    azelastine (ASTELIN) 0.1 % nasal spray, PLACE 1 SPRAY INTO BOTH NOSTRILS 2 (TWO) TIMES DAILY., Disp: 30 mL, Rfl: 4   cholecalciferol (VITAMIN D) 1000 UNITS tablet, Take 1,000 Units by mouth daily., Disp: , Rfl:    diclofenac Sodium (VOLTAREN) 1 % GEL, APPLY 2 GRAMS TOPICALLY 4 (FOUR) TIMES DAILY. (AS NEEDED FOR JOINT PAIN), Disp: 400 g, Rfl: 3   diltiazem (CARDIZEM CD) 120 MG 24 hr capsule, Take 1 capsule (120 mg total)  by mouth daily., Disp: 90 capsule, Rfl: 3   empagliflozin (JARDIANCE) 25 MG TABS tablet, Take 1 tablet (25 mg total) by mouth daily before breakfast., Disp: 90 tablet, Rfl: 4   fluticasone (FLONASE) 50 MCG/ACT nasal spray, SPRAY 2 SPRAYS INTO EACH NOSTRIL EVERY DAY, Disp: 48 mL, Rfl: 1   furosemide (LASIX) 40 MG tablet, Take 1 tablet (40 mg total) by mouth daily as needed for fluid or edema., Disp: 90 tablet, Rfl: 3   gabapentin (NEURONTIN) 100 MG capsule, TAKE 2 CAPSULES BY MOUTH 3 TIMES DAILY., Disp: 180 capsule, Rfl: 0   hydrocortisone (ANUSOL-HC) 2.5 % rectal cream, PLACE 1 APPLICATION RECTALLY AS NEEDED  FOR HEMORRHOID FLARES- MAY USE 7 CONSECUTIVE DAYS, Disp: 30 g, Rfl: 1   levothyroxine (SYNTHROID) 125 MCG tablet, TAKE 1 TABLET BY MOUTH DAILY BEFORE BREAKFAST., Disp: 90 tablet, Rfl: 2   lidocaine (LIDODERM) 5 %, Place 1 patch onto the skin daily. Remove & Discard patch within 12 hours or as directed by MD, Disp: 30 patch, Rfl: 3   lidocaine (XYLOCAINE) 2 % jelly, Apply 1 application topically 3 (three) times daily. Apply to hemorrhoid when painful, Disp: 30 mL, Rfl: 1   lisinopril (ZESTRIL) 5 MG tablet, Take 1 tablet (5 mg total) by mouth daily., Disp: 90 tablet, Rfl: 3   magnesium oxide (MAG-OX) 400 MG tablet, 400 mg., Disp: , Rfl:    meclizine (ANTIVERT) 50 MG tablet, Take 1 tablet (50 mg total) by mouth 3 (three) times daily as needed., Disp: 60 tablet, Rfl: 0   Potassium Chloride ER 20 MEQ TBCR, TAKE 1 TABLET BY MOUTH EVERY DAY, Disp: 90 tablet, Rfl: 0   PREMARIN vaginal cream, USING APPLICATOR PLACE 1 GRAM IN VAGINA EVERY OTHER NIGHT AS INSTRUCTED, Disp: 30 g, Rfl: 3   Rivaroxaban (XARELTO) 15 MG TABS tablet, TAKE 1 TABLET (15 MG TOTAL) BY MOUTH DAILY WITH SUPPER, Disp: 90 tablet, Rfl: 0   rosuvastatin (CRESTOR) 20 MG tablet, Take 1 tablet (20 mg total) by mouth daily., Disp: 90 tablet, Rfl: 3   sitaGLIPtin (JANUVIA) 100 MG tablet, TAKE 1 TABLET BY MOUTH EVERY DAY, Disp: 90 tablet, Rfl: 0   TETRAHYDROZOLINE HCL OP, Apply to eye. Reported on 01/02/2016, Disp: , Rfl:  Social History   Socioeconomic History   Marital status: Divorced    Spouse name: Not on file   Number of children: 2   Years of education: 12   Highest education level: High school graduate  Occupational History   Occupation: Retired  Tobacco Use   Smoking status: Never   Smokeless tobacco: Never  Scientific laboratory technician Use: Never used  Substance and Sexual Activity   Alcohol use: No   Drug use: No   Sexual activity: Not Currently  Other Topics Concern   Not on file  Social History Narrative   Lives alone. Children  live nearby   Social Determinants of Health   Financial Resource Strain: Low Risk  (11/02/2021)   Overall Financial Resource Strain (CARDIA)    Difficulty of Paying Living Expenses: Not very hard  Food Insecurity: No Food Insecurity (11/02/2021)   Hunger Vital Sign    Worried About Running Out of Food in the Last Year: Never true    Ran Out of Food in the Last Year: Never true  Transportation Needs: No Transportation Needs (11/02/2021)   PRAPARE - Hydrologist (Medical): No    Lack of Transportation (Non-Medical): No  Physical Activity: Inactive (  11/02/2021)   Exercise Vital Sign    Days of Exercise per Week: 0 days    Minutes of Exercise per Session: 0 min  Stress: No Stress Concern Present (11/02/2021)   [place]    Feeling of Stress : Only a little  Social Connections: Moderately Isolated (11/02/2021)   Social Connection and Isolation Panel [NHANES]    Frequency of Communication with Friends and Family: More than three times a week    Frequency of Social Gatherings with Friends and Family: More than three times a week    Attends Religious Services: Never    Marine scientist or Organizations: Yes    Attends Music therapist: More than 4 times per year    Marital Status: Divorced  Intimate Partner Violence: Not At Risk (11/02/2021)   Humiliation, Afraid, Rape, and Kick questionnaire    Fear of Current or Ex-Partner: No    Emotionally Abused: No    Physically Abused: No    Sexually Abused: No   Family History  Problem Relation Age of Onset   Stroke Mother    Diabetes Mother    Heart disease Father    Atrial fibrillation Father    Cancer Sister        ovarian / colon   Diabetes Sister    Heart disease Brother    Hyperlipidemia Brother    Hypertension Brother    Diabetes Brother    Heart attack Brother 53       had to perform emergency surgery   Dementia Brother     Hyperlipidemia Sister    Cancer Sister        liver   Hyperlipidemia Sister    Diabetes Sister        Boarderline DM   Cancer Sister    Diabetes Sister    Diabetes Daughter    Rheum arthritis Daughter    Psoriasis Daughter     Objective: Office vital signs reviewed. Ht '5\' 4"'$  (1.626 m)   BMI 26.85 kg/m   Physical Examination:  General: Awake, alert, well nourished, No acute distress HEENT: sclera white, MMM Cardio: regular rate and rhythm, S1S2 heard, no murmurs appreciated Pulm: clear to auscultation bilaterally, no wheezes, rhonchi or rales; normal work of breathing on room air MSK: Ambulating independently with use of cane  Diabetic Foot Exam - Simple   Simple Foot Form Diabetic Foot exam was performed with the following findings: Yes 05/22/2022  1:26 PM  Visual Inspection No deformities, no ulcerations, no other skin breakdown bilaterally: Yes Sensation Testing Intact to touch and monofilament testing bilaterally: Yes Pulse Check Posterior Tibialis and Dorsalis pulse intact bilaterally: Yes Comments      Assessment/ Plan: 82 y.o. female   Type 2 diabetes mellitus with stage 3a chronic kidney disease, without long-term current use of insulin ([place]) - Plan: Microalbumin / creatinine urine ratio, Bayer DCA Hb A1c Waived, dapagliflozin propanediol (FARXIGA) 10 MG TABS tablet  Hypertension associated with diabetes ([place])  Hyperlipidemia associated with type 2 diabetes mellitus ([place])  Arthritis of left hip  Need for immunization against influenza - Plan: Flu Vaccine QUAD High Dose(Fluad)  Diabetes remains uncontrolled.  I prefer her to be less than 7.5 even with advanced age due to CKD.  She wishes to switch off of Jardiance onto alternative so I placed her on Farxiga at the equivalent dosage.  Urine microalbumin obtained for completion though she is on an ACE inhibitor  Blood pressure is controlled.  No changes  Possibly statin induced myalgia.  We discussed  holding the Crestor for 2 days and if myalgia resolved it is statin induced and she could consider adding co-Q10.  Alternatively, we can switch her either to Zetia or Nexletol  Influenza vaccination administered  Orders Placed This Encounter  Procedures   Microalbumin / creatinine urine ratio   Bayer DCA Hb A1c Waived   No orders of the defined types were placed in this encounter.    Janora Norlander, DO [place] [phone]

## 2022-05-22 NOTE — Addendum Note (Signed)
Addended by: Lanier Prude D on: 05/22/2022 01:30 PM   Modules accepted: Orders

## 2022-05-23 LAB — MICROALBUMIN / CREATININE URINE RATIO
Creatinine, Urine: 34.6 mg/dL
Microalb/Creat Ratio: 22 mg/g creat (ref 0–29)
Microalbumin, Urine: 7.6 ug/mL

## 2022-05-28 ENCOUNTER — Ambulatory Visit: Payer: Medicare Other

## 2022-05-28 DIAGNOSIS — M25552 Pain in left hip: Secondary | ICD-10-CM

## 2022-05-28 DIAGNOSIS — M1612 Unilateral primary osteoarthritis, left hip: Secondary | ICD-10-CM | POA: Diagnosis not present

## 2022-05-28 NOTE — Therapy (Signed)
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    Cataract    Depression 12/12/2015   Dyshydrosis    Hemorrhoids    Hyperlipidemia    Hypertension    Menopause    NIDDM (non-insulin dependent diabetes mellitus)    diet controlled    Osteopenia 2016   Syncope 11/08/2015   Thyroid disease    Past Surgical History:  Procedure Laterality Date   ABDOMINAL HYSTERECTOMY     partial, prolapse   ABLATION     bunionectomy bilateral     EYE SURGERY Bilateral    cataracts   LUMBAR DISC SURGERY     L4 and L5   PACEMAKER INSERTION  12/14/2015   ROTATOR CUFF REPAIR Right    TUBAL LIGATION     Patient Active Problem List   Diagnosis Date Noted   Hyperlipidemia associated with type 2 diabetes mellitus ([place]) 11/29/2019   Primary osteoarthritis involving multiple joints 01/22/2018   Hemorrhoid 03/18/2017   Stenosis of cervical spine with myelopathy ([place]) 01/29/2017   SSS (sick sinus syndrome) ([place]) 06/07/2016   Cardiac pacemaker in situ 12/15/2015   Anxiety 12/12/2015   Depression 12/12/2015   Hypothyroidism 08/23/2015   Type 2 diabetes mellitus ([place]) 05/20/2015   Osteopenia, senile 08/11/2014   Uterovaginal prolapse 07/17/2013   Ectopic atrial tachycardia 06/23/2013   Mitral regurgitation 05/12/2013   Pulmonary hypertension ([place]) 05/12/2013   Hypertension associated with diabetes ([place]) 04/03/2013    Sinus pause 11/03/2012   Long term current use of anticoagulant therapy 11/03/2012   Visual haloes around lights 10/27/2012   Degenerative disc disease, lumbar 02/15/2012   S/P atrioventricular nodal ablation 07/30/2011   REFERRING PROVIDER: Darla Lesches  REFERRING DIAG: Pain of left hip.  THERAPY DIAG:  Pain in left hip  Rationale for Evaluation and Treatment: Rehabilitation  ONSET DATE: ~3 months.  SUBJECTIVE:   SUBJECTIVE STATEMENT: Pt denies any pain today and that she took a Tylenol prior to today's treatment.  PERTINENT HISTORY: Pacemaker, atr-fib, HTN, lumbar surgery (L4-5). PAIN:  Are you having pain? No  PRECAUTIONS: PACEMAKER.  PATIENT GOALS: Not have pain.  OBJECTIVE:   DIAGNOSTIC FINDINGS: Mild osteoarthritis of the left hip.  TODAY'S TREATMENT:  Manual Therapy Soft Tissue Mobilization: left hip, STW/M performed to left IT band and glute region to decrease pain and tone    Modalities  Date: 11/16 Hot Pack: Hip, 10 mins, Pain  HEP: [id number] ([facility])  ASSESSMENT:  CLINICAL IMPRESSION: Pt arrives for today's treatment session denying any pain.  Pt does report tenderness down the length of left IT band with slight increase at distal end.  STW/M performed to left IT band and left glute to decrease pain and tone with pt in right side-lying for comfort.  MH utilized half way through treatment to decrease tone and increase relaxation prior to conclusion of STW.  Pt given tennis ball to perform STW at home.  Pt denied any pain and reported decreased tone at completion of today's treatment session.   OBJECTIVE IMPAIRMENTS: decreased activity tolerance and pain.   ACTIVITY LIMITATIONS: standing and locomotion level  PARTICIPATION LIMITATIONS: cleaning  PERSONAL FACTORS: 1 comorbidity: previous lumbar surgery  are also affecting  patient's functional outcome.   REHAB POTENTIAL: Excellent  CLINICAL DECISION MAKING: Stable/uncomplicated  EVALUATION COMPLEXITY: Low   GOALS:  LONG TERM GOALS: Target date: 06/25/2022   Ind with a HEP. Baseline:  Goal status: MET  2.  Stand 20 minutes with pain not > 2/10. Baseline:  Goal status: IN PROGRESS  3.  Eliminate left posterior thigh pain. Baseline:  Goal status: MET  4.  Walk a community distance with pain  not > 2-3/10. Baseline:  Goal status: IN PROGRESS  5.  Perform ADL's with pain not > 3/10. Baseline:  Goal status: IN PROGRESS  PLAN:  PT FREQUENCY: 2x/week  PT DURATION: 4 weeks  PLANNED INTERVENTIONS: Therapeutic exercises, Therapeutic activity, Patient/Family education, Self Care, Dry Needling, Cryotherapy, Moist heat, and Manual therapy  PLAN FOR NEXT SESSION: STW/M, SKTC, dry needling, HMP.   Kathrynn Ducking, PTA 05/28/2022, 1:44 PM

## 2022-05-30 ENCOUNTER — Ambulatory Visit: Payer: Medicare Other | Admitting: Physical Therapy

## 2022-05-30 ENCOUNTER — Encounter: Payer: Self-pay | Admitting: Physical Therapy

## 2022-05-30 DIAGNOSIS — M25552 Pain in left hip: Secondary | ICD-10-CM | POA: Diagnosis not present

## 2022-05-30 DIAGNOSIS — M1612 Unilateral primary osteoarthritis, left hip: Secondary | ICD-10-CM | POA: Diagnosis not present

## 2022-05-30 NOTE — Therapy (Signed)
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    Cataract    Depression 12/12/2015   Dyshydrosis    Hemorrhoids    Hyperlipidemia    Hypertension    Menopause    NIDDM (non-insulin dependent diabetes mellitus)    diet controlled    Osteopenia 2016   Syncope 11/08/2015   Thyroid disease    Past Surgical History:  Procedure Laterality Date   ABDOMINAL HYSTERECTOMY     partial, prolapse   ABLATION     bunionectomy bilateral     EYE SURGERY Bilateral    cataracts   LUMBAR DISC SURGERY     L4 and L5   PACEMAKER INSERTION  12/14/2015   ROTATOR CUFF REPAIR Right    TUBAL LIGATION     Patient Active Problem List   Diagnosis Date Noted   Hyperlipidemia associated with type 2 diabetes mellitus ([place]) 11/29/2019   Primary osteoarthritis involving multiple joints 01/22/2018   Hemorrhoid 03/18/2017   Stenosis of cervical spine with myelopathy ([place]) 01/29/2017   SSS (sick sinus syndrome) ([place]) 06/07/2016   Cardiac pacemaker in situ 12/15/2015   Anxiety 12/12/2015   Depression 12/12/2015   Hypothyroidism 08/23/2015   Type 2 diabetes mellitus ([place]) 05/20/2015   Osteopenia, senile 08/11/2014   Uterovaginal prolapse 07/17/2013   Ectopic atrial tachycardia 06/23/2013   Mitral regurgitation 05/12/2013   Pulmonary hypertension ([place]) 05/12/2013   Hypertension associated with diabetes ([place]) 04/03/2013    Sinus pause 11/03/2012   Long term current use of anticoagulant therapy 11/03/2012   Visual haloes around lights 10/27/2012   Degenerative disc disease, lumbar 02/15/2012   S/P atrioventricular nodal ablation 07/30/2011   REFERRING PROVIDER: Darla Lesches  REFERRING DIAG: Pain of left hip.  THERAPY DIAG:  Pain in left hip  Rationale for Evaluation and Treatment: Rehabilitation  ONSET DATE: ~3 months.  SUBJECTIVE:   SUBJECTIVE STATEMENT: No pain today and feels like she can walk better now.  PERTINENT HISTORY: Pacemaker, atr-fib, HTN, lumbar surgery (L4-5).  PAIN:  Are you having pain? No  PRECAUTIONS: PACEMAKER.  PATIENT GOALS: Not have pain.  OBJECTIVE:   DIAGNOSTIC FINDINGS: Mild osteoarthritis of the left hip.  TODAY'S TREATMENT:  Manual Therapy Soft Tissue Mobilization: left hip, STW/M performed to left IT band and glute region to decrease pain and tone    Modalities  Date: 11/29 Hot Pack: Hip, 10 mins, Pain  HEP: [id number] ([facility])  ASSESSMENT:  CLINICAL IMPRESSION: Patient presented in clinic with reports of no pain in L hip. Patient states that she is compliant with use of tennis ball to L ITB area as TP still palpable. No limitations noted reported via patient but has not attempted prolonged standing as of this time. TP predominately in mid to distal L ITB. Normal moist heat response noted.  OBJECTIVE IMPAIRMENTS: decreased activity tolerance and pain.   ACTIVITY LIMITATIONS: standing and locomotion level  PARTICIPATION LIMITATIONS: cleaning  PERSONAL FACTORS: 1 comorbidity: previous lumbar surgery  are also affecting patient's functional outcome.   REHAB POTENTIAL: Excellent  CLINICAL DECISION MAKING: Stable/uncomplicated  EVALUATION COMPLEXITY: Low   GOALS:  LONG TERM GOALS: Target date: 06/25/2022   Ind with a  HEP. Baseline:  Goal status: MET  2.  Stand 20 minutes with pain not > 2/10. Baseline:  Goal status: IN PROGRESS  3.  Eliminate left posterior thigh pain. Baseline:  Goal status: MET  4.  Walk a community distance with pain  not > 2-3/10. Baseline:  Goal status: IN PROGRESS  5.  Perform ADL's with pain not > 3/10. Baseline:  Goal status: IN PROGRESS  PLAN:  PT FREQUENCY: 2x/week  PT DURATION: 4 weeks  PLANNED INTERVENTIONS: Therapeutic exercises, Therapeutic activity, Patient/Family education, Self Care, Dry Needling, Cryotherapy, Moist heat, and Manual therapy  PLAN FOR NEXT SESSION: STW/M, SKTC, dry needling, HMP.   Standley Brooking, PTA 05/30/2022, 1:41 PM

## 2022-05-31 ENCOUNTER — Telehealth: Payer: Self-pay | Admitting: Family Medicine

## 2022-05-31 NOTE — Telephone Encounter (Signed)
Called and and spoke with patient and went over results. She does not want medication at this time for it. She will take the vit d and calcium. Per result note. Was no longer in pools documented on phone call. While on the phone with patient she wanted me to let you know that the cholesterol medication is causing her pain because it is better since she stayed off of it. Wants to stay off.

## 2022-06-04 ENCOUNTER — Telehealth: Payer: Medicare Other

## 2022-06-05 ENCOUNTER — Encounter: Payer: Self-pay | Admitting: Family Medicine

## 2022-06-05 ENCOUNTER — Ambulatory Visit (INDEPENDENT_AMBULATORY_CARE_PROVIDER_SITE_OTHER): Payer: Medicare Other | Admitting: Family Medicine

## 2022-06-05 ENCOUNTER — Telehealth: Payer: Self-pay

## 2022-06-05 ENCOUNTER — Other Ambulatory Visit: Payer: Self-pay | Admitting: Family Medicine

## 2022-06-05 VITALS — BP 137/78 | HR 88 | Temp 98.3°F | Ht 64.0 in | Wt 154.0 lb

## 2022-06-05 DIAGNOSIS — E1169 Type 2 diabetes mellitus with other specified complication: Secondary | ICD-10-CM

## 2022-06-05 DIAGNOSIS — J0100 Acute maxillary sinusitis, unspecified: Secondary | ICD-10-CM | POA: Diagnosis not present

## 2022-06-05 DIAGNOSIS — M609 Myositis, unspecified: Secondary | ICD-10-CM

## 2022-06-05 MED ORDER — AMOXICILLIN-POT CLAVULANATE 875-125 MG PO TABS: 1.0000 | ORAL_TABLET | Freq: Two times a day (BID) | ORAL | 0 refills | Status: AC

## 2022-06-05 MED ORDER — EZETIMIBE 10 MG PO TABS: 10.0000 mg | ORAL_TABLET | Freq: Every day | ORAL | 3 refills | Status: DC

## 2022-06-05 NOTE — Telephone Encounter (Signed)
Zetia sent. Lipid/lfts to be collected in 77m

## 2022-06-05 NOTE — Telephone Encounter (Signed)
Patient was in the office today and let me know that her body has not been hurting since d/c statin. She says she is interested in you sending in non statin to help with cholesterol

## 2022-06-05 NOTE — Telephone Encounter (Signed)
Patient aware.

## 2022-06-05 NOTE — Progress Notes (Signed)
Subjective:  Patient ID: Meredith Anderson, female    DOB: 08/25/1940, 81 y.o.   MRN: [id number]  Patient Care Team: Janora Norlander, DO as PCP - General (Family Medicine) Frederik Pear, MD as Consulting Physician (Internal Medicine) Wyvonnia Dusky, MD as Consulting Physician (Cardiology) Griselda Miner, MD as Consulting Physician (Dermatology) Lavera Guise, [facility] (Pharmacist)   Chief Complaint:  Sinusitis (Bloody /Post nasal rip/Sore face, bilateral/Some coughing)   HPI: Meredith Anderson is a 81 y.o. female presenting on 06/05/2022 for Sinusitis (Bloody /Post nasal rip/Sore face, bilateral/Some coughing)   Ongoing and worsening sinus pressure, facial pain, and congestion over the last 2.5 weeks. Has been taking allergy pill and using nasal spray without relief of symptoms.   Sinusitis This is a new problem. Episode onset: 2.5 weeks ago. The problem has been gradually worsening since onset. The pain is moderate. Associated symptoms include chills, congestion, coughing, headaches, sinus pressure and a sore throat. Pertinent negatives include no diaphoresis, ear pain, hoarse voice, neck pain, shortness of breath, sneezing or swollen glands. Past treatments include acetaminophen (nasal sprays). The treatment provided no relief.    Relevant past medical, surgical, family, and social history reviewed and updated as indicated.  Allergies and medications reviewed and updated. Data reviewed: Chart in Epic.   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    Cataract    Depression 12/12/2015   Dyshydrosis    Hemorrhoids    Hyperlipidemia    Hypertension    Menopause    NIDDM (non-insulin dependent diabetes mellitus)    diet controlled    Osteopenia 2016   Syncope 11/08/2015   Thyroid disease     Past Surgical History:  Procedure Laterality Date   ABDOMINAL HYSTERECTOMY      partial, prolapse   ABLATION     bunionectomy bilateral     EYE SURGERY Bilateral    cataracts   LUMBAR DISC SURGERY     L4 and L5   PACEMAKER INSERTION  12/14/2015   ROTATOR CUFF REPAIR Right    TUBAL LIGATION      Social History   Socioeconomic History   Marital status: Divorced    Spouse name: Not on file   Number of children: 2   Years of education: 12   Highest education level: High school graduate  Occupational History   Occupation: Retired  Tobacco Use   Smoking status: Never   Smokeless tobacco: Never  Vaping Use   Vaping Use: Never used  Substance and Sexual Activity   Alcohol use: No   Drug use: No   Sexual activity: Not Currently  Other Topics Concern   Not on file  Social History Narrative   Lives alone. Children live nearby   Social Determinants of Health   Financial Resource Strain: Low Risk  (11/02/2021)   Overall Financial Resource Strain (CARDIA)    Difficulty of Paying Living Expenses: Not very hard  Food Insecurity: No Food Insecurity (11/02/2021)   Hunger Vital Sign    Worried About Running Out of Food in the Last Year: Never true    Ran Out of Food in the Last Year: Never true  Transportation Needs: No Transportation Needs (11/02/2021)   PRAPARE - Hydrologist (Medical): No    Lack  of Transportation (Non-Medical): No  Physical Activity: Inactive (11/02/2021)   Exercise Vital Sign    Days of Exercise per Week: 0 days    Minutes of Exercise per Session: 0 min  Stress: No Stress Concern Present (11/02/2021)   [place]    Feeling of Stress : Only a little  Social Connections: Moderately Isolated (11/02/2021)   Social Connection and Isolation Panel [NHANES]    Frequency of Communication with Friends and Family: More than three times a week    Frequency of Social Gatherings with Friends and Family: More than three times a week    Attends Religious Services:  Never    Marine scientist or Organizations: Yes    Attends Music therapist: More than 4 times per year    Marital Status: Divorced  Intimate Partner Violence: Not At Risk (11/02/2021)   Humiliation, Afraid, Rape, and Kick questionnaire    Fear of Current or Ex-Partner: No    Emotionally Abused: No    Physically Abused: No    Sexually Abused: No    Outpatient Encounter Medications as of 06/05/2022  Medication Sig   ACCU-CHEK AVIVA PLUS test strip CHECK BLOOD SUGAR ONCE A DAY OR AS INSTRUCTED DX E11.9   acetaminophen (TYLENOL) 500 MG tablet Take by mouth.   amoxicillin-clavulanate (AUGMENTIN) 875-125 MG tablet Take 1 tablet by mouth 2 (two) times daily for 10 days.   azelastine (ASTELIN) 0.1 % nasal spray PLACE 1 SPRAY INTO BOTH NOSTRILS 2 (TWO) TIMES DAILY.   cholecalciferol (VITAMIN D) 1000 UNITS tablet Take 1,000 Units by mouth daily.   dapagliflozin propanediol (FARXIGA) 10 MG TABS tablet Take 1 tablet (10 mg total) by mouth daily before breakfast. To REPLACE jardiance   diclofenac Sodium (VOLTAREN) 1 % GEL APPLY 2 GRAMS TOPICALLY 4 (FOUR) TIMES DAILY. (AS NEEDED FOR JOINT PAIN)   diltiazem (CARDIZEM CD) 120 MG 24 hr capsule Take 1 capsule (120 mg total) by mouth daily.   furosemide (LASIX) 40 MG tablet Take 1 tablet (40 mg total) by mouth daily as needed for fluid or edema.   gabapentin (NEURONTIN) 100 MG capsule TAKE 2 CAPSULES BY MOUTH 3 TIMES DAILY.   hydrocortisone (ANUSOL-HC) 2.5 % rectal cream PLACE 1 APPLICATION RECTALLY AS NEEDED FOR HEMORRHOID FLARES- MAY USE 7 CONSECUTIVE DAYS   levothyroxine (SYNTHROID) 125 MCG tablet TAKE 1 TABLET BY MOUTH DAILY BEFORE BREAKFAST.   lidocaine (LIDODERM) 5 % Place 1 patch onto the skin daily. Remove & Discard patch within 12 hours or as directed by MD   lidocaine (XYLOCAINE) 2 % jelly Apply 1 application topically 3 (three) times daily. Apply to hemorrhoid when painful   lisinopril (ZESTRIL) 5 MG tablet Take 1 tablet (5 mg  total) by mouth daily.   magnesium oxide (MAG-OX) 400 MG tablet 400 mg.   meclizine (ANTIVERT) 50 MG tablet Take 1 tablet (50 mg total) by mouth 3 (three) times daily as needed.   Potassium Chloride ER 20 MEQ TBCR TAKE 1 TABLET BY MOUTH EVERY DAY   PREMARIN vaginal cream USING APPLICATOR PLACE 1 GRAM IN VAGINA EVERY OTHER NIGHT AS INSTRUCTED   Rivaroxaban (XARELTO) 15 MG TABS tablet TAKE 1 TABLET (15 MG TOTAL) BY MOUTH DAILY WITH SUPPER   rosuvastatin (CRESTOR) 20 MG tablet Take 1 tablet (20 mg total) by mouth daily.   sitaGLIPtin (JANUVIA) 100 MG tablet TAKE 1 TABLET BY MOUTH EVERY DAY   TETRAHYDROZOLINE HCL OP Apply to eye.  Reported on 01/02/2016   No facility-administered encounter medications on file as of 06/05/2022.    Allergies  Allergen Reactions   Dofetilide Other (See Comments)    Passed out   Doxycycline     diarrhea    Amlodipine     Other reaction(s): Other (See Comments) Swollen ankles   Dronedarone Other (See Comments)    Increased BG   Glipizide-Metformin Hcl     Other reaction(s): Diarrhea, Other (See Comments) Weakness   Metformin     Other reaction(s): Diarrhea   Niaspan [Niacin] Rash    Review of Systems  Constitutional:  Positive for chills. Negative for activity change, appetite change, diaphoresis, fatigue, fever and unexpected weight change.  HENT:  Positive for congestion, postnasal drip, rhinorrhea, sinus pressure, sinus pain and sore throat. Negative for dental problem, drooling, ear discharge, ear pain, facial swelling, hearing loss, hoarse voice, mouth sores, nosebleeds, sneezing, tinnitus, trouble swallowing and voice change.   Eyes:  Negative for photophobia and visual disturbance.  Respiratory:  Positive for cough. Negative for apnea, choking, chest tightness, shortness of breath, wheezing and stridor.   Cardiovascular:  Negative for chest pain, palpitations and leg swelling.  Gastrointestinal:  Negative for abdominal pain, diarrhea, nausea and  vomiting.  Genitourinary:  Negative for decreased urine volume and difficulty urinating.  Musculoskeletal:  Negative for arthralgias, myalgias and neck pain.  Neurological:  Positive for headaches. Negative for dizziness, tremors, seizures, syncope, facial asymmetry, speech difficulty, weakness, light-headedness and numbness.  Psychiatric/Behavioral:  Negative for confusion.   All other systems reviewed and are negative.       Objective:  BP 137/78   Pulse 88   Temp 98.3 F (36.8 C)   Ht '5\' 4"'$  (1.626 m)   Wt 154 lb (69.9 kg)   SpO2 99%   BMI 26.43 kg/m    Wt Readings from Last 3 Encounters:  06/05/22 154 lb (69.9 kg)  05/22/22 153 lb 3.2 oz (69.5 kg)  04/25/22 156 lb 6.4 oz (70.9 kg)    Physical Exam Vitals and nursing note reviewed.  Constitutional:      General: She is not in acute distress.    Appearance: Normal appearance. She is well-developed and well-groomed. She is not ill-appearing, toxic-appearing or diaphoretic.  HENT:     Head: Normocephalic and atraumatic.     Jaw: There is normal jaw occlusion.     Right Ear: Hearing normal. A middle ear effusion is present. Tympanic membrane is not erythematous.     Left Ear: Hearing normal. A middle ear effusion is present. Tympanic membrane is not erythematous.     Nose: Congestion and rhinorrhea present. Rhinorrhea is purulent.     Right Turbinates: Enlarged.     Left Turbinates: Enlarged.     Right Sinus: Maxillary sinus tenderness present. No frontal sinus tenderness.     Left Sinus: Maxillary sinus tenderness and frontal sinus tenderness present.     Mouth/Throat:     Lips: Pink.     Mouth: Mucous membranes are moist.     Pharynx: Oropharynx is clear. Uvula midline. Posterior oropharyngeal erythema present. No pharyngeal swelling, oropharyngeal exudate or uvula swelling.     Tonsils: No tonsillar exudate.     Comments: Postnasal drainage, cobblestoning of posterior oropharynx Eyes:     General: Lids are normal.      Extraocular Movements: Extraocular movements intact.     Conjunctiva/sclera: Conjunctivae normal.     Pupils: Pupils are equal, round, and reactive to light.  Neck:     Thyroid: No thyroid mass, thyromegaly or thyroid tenderness.     Vascular: No carotid bruit or JVD.     Trachea: Trachea and phonation normal.  Cardiovascular:     Rate and Rhythm: Normal rate and regular rhythm.     Chest Wall: PMI is not displaced.     Pulses: Normal pulses.     Heart sounds: Normal heart sounds. No murmur heard.    No friction rub. No gallop.  Pulmonary:     Effort: Pulmonary effort is normal. No respiratory distress.     Breath sounds: Normal breath sounds. No wheezing.  Abdominal:     General: Bowel sounds are normal. There is no distension or abdominal bruit.     Palpations: Abdomen is soft. There is no hepatomegaly or splenomegaly.     Tenderness: There is no abdominal tenderness. There is no right CVA tenderness or left CVA tenderness.     Hernia: No hernia is present.  Musculoskeletal:        General: Normal range of motion.     Cervical back: Normal range of motion and neck supple.     Right lower leg: No edema.     Left lower leg: No edema.  Lymphadenopathy:     Cervical: No cervical adenopathy.  Skin:    General: Skin is warm and dry.     Capillary Refill: Capillary refill takes less than 2 seconds.     Coloration: Skin is not cyanotic, jaundiced or pale.     Findings: No rash.  Neurological:     General: No focal deficit present.     Mental Status: She is alert and oriented to person, place, and time.     Sensory: Sensation is intact.     Motor: Motor function is intact.     Coordination: Coordination is intact.     Gait: Gait is intact.     Deep Tendon Reflexes: Reflexes are normal and symmetric.  Psychiatric:        Attention and Perception: Attention and perception normal.        Mood and Affect: Mood and affect normal.        Speech: Speech normal.        Behavior:  Behavior normal. Behavior is cooperative.        Thought Content: Thought content normal.        Cognition and Memory: Cognition and memory normal.        Judgment: Judgment normal.     Results for orders placed or performed in visit on 05/31/22  HM DIABETES EYE EXAM  Result Value Ref Range   HM Diabetic Eye Exam No Retinopathy No Retinopathy       Pertinent labs & imaging results that were available during my care of the patient were reviewed by me and considered in my medical decision making.  Assessment & Plan:  Tynisa was seen today for sinusitis.  Diagnoses and all orders for this visit:  Acute non-recurrent maxillary sinusitis Has tried and failed symptomatic care at home. Will add antibiotics to regimen. Pt aware to continue symptomatic care at home. Report new, worsening, or persistent symptoms.  -     amoxicillin-clavulanate (AUGMENTIN) 875-125 MG tablet; Take 1 tablet by mouth 2 (two) times daily for 10 days.     Continue all other maintenance medications.  Follow up plan: Return if symptoms worsen or fail to improve.   Continue healthy lifestyle choices, including diet (rich in fruits,  vegetables, and lean proteins, and low in salt and simple carbohydrates) and exercise (at least 30 minutes of moderate physical activity daily).  Educational handout given for sinusitis   The above assessment and management plan was discussed with the patient. The patient verbalized understanding of and has agreed to the management plan. Patient is aware to call the clinic if they develop any new symptoms or if symptoms persist or worsen. Patient is aware when to return to the clinic for a follow-up visit. Patient educated on when it is appropriate to go to the emergency department.   Monia Pouch, FNP-C [facility] [phone]

## 2022-06-06 ENCOUNTER — Ambulatory Visit: Payer: Medicare Other | Admitting: Family Medicine

## 2022-06-22 DIAGNOSIS — I495 Sick sinus syndrome: Secondary | ICD-10-CM | POA: Diagnosis not present

## 2022-06-22 DIAGNOSIS — Z7901 Long term (current) use of anticoagulants: Secondary | ICD-10-CM | POA: Diagnosis not present

## 2022-06-22 DIAGNOSIS — I4729 Other ventricular tachycardia: Secondary | ICD-10-CM | POA: Diagnosis not present

## 2022-06-22 DIAGNOSIS — Z950 Presence of cardiac pacemaker: Secondary | ICD-10-CM | POA: Diagnosis not present

## 2022-06-22 DIAGNOSIS — I4891 Unspecified atrial fibrillation: Secondary | ICD-10-CM | POA: Diagnosis not present

## 2022-06-22 DIAGNOSIS — Z45010 Encounter for checking and testing of cardiac pacemaker pulse generator [battery]: Secondary | ICD-10-CM | POA: Diagnosis not present

## 2022-07-05 ENCOUNTER — Other Ambulatory Visit: Payer: Self-pay | Admitting: Family Medicine

## 2022-07-05 DIAGNOSIS — E1159 Type 2 diabetes mellitus with other circulatory complications: Secondary | ICD-10-CM

## 2022-07-07 ENCOUNTER — Other Ambulatory Visit: Payer: Self-pay | Admitting: Family Medicine

## 2022-07-07 DIAGNOSIS — G8929 Other chronic pain: Secondary | ICD-10-CM

## 2022-08-01 ENCOUNTER — Other Ambulatory Visit: Payer: Self-pay | Admitting: Family Medicine

## 2022-08-06 ENCOUNTER — Telehealth: Payer: Self-pay | Admitting: Family Medicine

## 2022-08-06 ENCOUNTER — Other Ambulatory Visit: Payer: Self-pay

## 2022-08-06 NOTE — Telephone Encounter (Signed)
Labs have been pended - left vm

## 2022-08-08 ENCOUNTER — Other Ambulatory Visit: Payer: Self-pay | Admitting: Family Medicine

## 2022-08-08 DIAGNOSIS — G8929 Other chronic pain: Secondary | ICD-10-CM

## 2022-08-20 ENCOUNTER — Other Ambulatory Visit: Payer: Self-pay

## 2022-08-20 ENCOUNTER — Other Ambulatory Visit: Payer: Medicare Other

## 2022-08-20 DIAGNOSIS — E1169 Type 2 diabetes mellitus with other specified complication: Secondary | ICD-10-CM | POA: Diagnosis not present

## 2022-08-20 DIAGNOSIS — N1831 Chronic kidney disease, stage 3a: Secondary | ICD-10-CM | POA: Diagnosis not present

## 2022-08-20 DIAGNOSIS — M609 Myositis, unspecified: Secondary | ICD-10-CM

## 2022-08-20 DIAGNOSIS — E034 Atrophy of thyroid (acquired): Secondary | ICD-10-CM

## 2022-08-20 DIAGNOSIS — E785 Hyperlipidemia, unspecified: Secondary | ICD-10-CM | POA: Diagnosis not present

## 2022-08-20 DIAGNOSIS — E1122 Type 2 diabetes mellitus with diabetic chronic kidney disease: Secondary | ICD-10-CM | POA: Diagnosis not present

## 2022-08-20 LAB — BAYER DCA HB A1C WAIVED: HB A1C (BAYER DCA - WAIVED): 8.2 % — ABNORMAL HIGH (ref 4.8–5.6)

## 2022-08-21 LAB — LIPID PANEL
Chol/HDL Ratio: 5.8 ratio — ABNORMAL HIGH (ref 0.0–4.4)
Cholesterol, Total: 187 mg/dL (ref 100–199)
HDL: 32 mg/dL — ABNORMAL LOW (ref >39)
LDL Chol Calc (NIH): 101 mg/dL — ABNORMAL HIGH (ref 0–99)
Triglycerides: 319 mg/dL — ABNORMAL HIGH (ref 0–149)
VLDL Cholesterol Cal: 54 mg/dL — ABNORMAL HIGH (ref 5–40)

## 2022-08-21 LAB — CMP14+EGFR
ALT: 12 IU/L (ref 0–32)
AST: 16 IU/L (ref 0–40)
Albumin/Globulin Ratio: 1.8 (ref 1.2–2.2)
Albumin: 4.9 g/dL — ABNORMAL HIGH (ref 3.7–4.7)
Alkaline Phosphatase: 132 IU/L — ABNORMAL HIGH (ref 44–121)
BUN/Creatinine Ratio: 20 (ref 12–28)
BUN: 18 mg/dL (ref 8–27)
Bilirubin Total: 0.6 mg/dL (ref 0.0–1.2)
CO2: 22 mmol/L (ref 20–29)
Calcium: 10.3 mg/dL (ref 8.7–10.3)
Chloride: 99 mmol/L (ref 96–106)
Creatinine, Ser: 0.91 mg/dL (ref 0.57–1.00)
Globulin, Total: 2.8 g/dL (ref 1.5–4.5)
Glucose: 156 mg/dL — ABNORMAL HIGH (ref 70–99)
Potassium: 4.5 mmol/L (ref 3.5–5.2)
Sodium: 140 mmol/L (ref 134–144)
Total Protein: 7.7 g/dL (ref 6.0–8.5)
eGFR: 63 mL/min/{1.73_m2} (ref >59)

## 2022-08-21 LAB — THYROID PANEL WITH TSH
Free Thyroxine Index: 3.1 (ref 1.2–4.9)
T3 Uptake Ratio: 32 % (ref 24–39)
T4, Total: 9.6 ug/dL (ref 4.5–12.0)
TSH: 4.08 u[IU]/mL (ref 0.450–4.500)

## 2022-08-21 LAB — HEPATIC FUNCTION PANEL: Bilirubin, Direct: 0.14 mg/dL (ref 0.00–0.40)

## 2022-08-21 LAB — MICROALBUMIN / CREATININE URINE RATIO
Creatinine, Urine: 48.4 mg/dL
Microalb/Creat Ratio: 46 mg/g creat — ABNORMAL HIGH (ref 0–29)
Microalbumin, Urine: 22.3 ug/mL

## 2022-08-22 ENCOUNTER — Ambulatory Visit (INDEPENDENT_AMBULATORY_CARE_PROVIDER_SITE_OTHER): Payer: Medicare Other | Admitting: Family Medicine

## 2022-08-22 ENCOUNTER — Encounter: Payer: Self-pay | Admitting: Family Medicine

## 2022-08-22 VITALS — BP 141/79 | HR 100 | Temp 98.7°F | Ht 64.0 in | Wt 157.0 lb

## 2022-08-22 DIAGNOSIS — E1169 Type 2 diabetes mellitus with other specified complication: Secondary | ICD-10-CM

## 2022-08-22 DIAGNOSIS — E1159 Type 2 diabetes mellitus with other circulatory complications: Secondary | ICD-10-CM | POA: Diagnosis not present

## 2022-08-22 DIAGNOSIS — I152 Hypertension secondary to endocrine disorders: Secondary | ICD-10-CM | POA: Diagnosis not present

## 2022-08-22 DIAGNOSIS — E1122 Type 2 diabetes mellitus with diabetic chronic kidney disease: Secondary | ICD-10-CM | POA: Diagnosis not present

## 2022-08-22 DIAGNOSIS — M609 Myositis, unspecified: Secondary | ICD-10-CM

## 2022-08-22 DIAGNOSIS — E034 Atrophy of thyroid (acquired): Secondary | ICD-10-CM | POA: Diagnosis not present

## 2022-08-22 DIAGNOSIS — T466X5A Adverse effect of antihyperlipidemic and antiarteriosclerotic drugs, initial encounter: Secondary | ICD-10-CM | POA: Diagnosis not present

## 2022-08-22 DIAGNOSIS — N1831 Chronic kidney disease, stage 3a: Secondary | ICD-10-CM

## 2022-08-22 DIAGNOSIS — E785 Hyperlipidemia, unspecified: Secondary | ICD-10-CM | POA: Diagnosis not present

## 2022-08-22 MED ORDER — METFORMIN HCL ER 500 MG PO TB24: 500.0000 mg | ORAL_TABLET | Freq: Every day | ORAL | 0 refills | Status: DC

## 2022-08-22 MED ORDER — PRAVASTATIN SODIUM 20 MG PO TABS: 20.0000 mg | ORAL_TABLET | Freq: Every day | ORAL | 3 refills | Status: DC

## 2022-08-22 NOTE — Progress Notes (Signed)
Subjective: CC:Dm PCP: Janora Norlander, DO DA:[id number] [name] is a 82 y.o. female presenting to clinic today for:  1. Type 2 Diabetes with hypertension, hyperlipidemia associated with CKD 3A:  She brings me sugar log today which shows there is on average 140s to 150s.  She had labs done which showed A1c of 8.2.  This is a rise from her previous checkup.  She is compliant with Farxiga 10 mg daily, Januvia 100 mg daily.  Renal function actually had improved some and was in CKD 2 range.  She admits to eating sugar quite a bit.  Last eye exam: May 23 Last foot exam: 05/2023 Last A1c:  Lab Results  Component Value Date   HGBA1C 8.2 (H) 08/20/2022   Nephropathy screen indicated?: UTD Last flu, zoster and/or pneumovax:  Immunization History  Administered Date(s) Administered   Fluad Quad(high Dose 65+) 03/25/2019, 04/27/2020, 05/22/2022   Influenza Whole 04/01/2012   Influenza, High Dose Seasonal PF 04/17/2017, 04/16/2018   Influenza, Seasonal, Injecte, Preservative Fre 03/28/2010, 05/29/2011, 04/03/2012   Influenza,inj,Quad PF,6+ Mos 04/03/2013, 04/03/2016   Influenza-Unspecified 03/15/2014   Moderna Sars-Covid-2 Vaccination 07/21/2019, 08/18/2019, 06/16/2020   Pneumococcal Conjugate-13 05/17/2014   Pneumococcal Polysaccharide-23 08/02/2009   Tdap 01/31/2012, 02/21/2012   Zoster Recombinat (Shingrix) 02/28/2021   Zoster, Live 01/31/2012, 02/21/2012    ROS: No chest pain, shortness of breath, blurred vision but she does report myalgia with the Zetia.  We discontinued her previous statin due to similar.  She is having been taking every other day and does notice on the days that she does not take it her muscles seem to be less painful  ROS: Per HPI  Allergies  Allergen Reactions   Dofetilide Other (See Comments)    Passed out   Doxycycline     diarrhea    Amlodipine     Other reaction(s): Other (See Comments) Swollen ankles   Dronedarone Other (See Comments)    Increased  BG   Glipizide-Metformin Hcl     Other reaction(s): Diarrhea, Other (See Comments) Weakness   Metformin     Other reaction(s): Diarrhea   Niaspan [Niacin] Rash   Past Medical History:  Diagnosis Date   A-fib ([place])    Allergy    seasonal   Anxiety    Arthritis    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    Cataract    Depression 12/12/2015   Dyshydrosis    Hemorrhoids    Hyperlipidemia    Hypertension    Menopause    NIDDM (non-insulin dependent diabetes mellitus)    diet controlled    Osteopenia 2016   Syncope 11/08/2015   Thyroid disease     Current Outpatient Medications:    ezetimibe (ZETIA) 10 MG tablet, Take 1 tablet (10 mg total) by mouth daily., Disp: 90 tablet, Rfl: 3   acetaminophen (TYLENOL) 500 MG tablet, Take by mouth., Disp: , Rfl:    azelastine (ASTELIN) 0.1 % nasal spray, PLACE 1 SPRAY INTO BOTH NOSTRILS 2 (TWO) TIMES DAILY., Disp: 30 mL, Rfl: 4   cholecalciferol (VITAMIN D) 1000 UNITS tablet, Take 1,000 Units by mouth daily., Disp: , Rfl:    dapagliflozin propanediol (FARXIGA) 10 MG TABS tablet, Take 1 tablet (10 mg total) by mouth daily before breakfast. To REPLACE jardiance, Disp: 90 tablet, Rfl: 3   diclofenac Sodium (VOLTAREN) 1 % GEL, APPLY 2 GRAMS TOPICALLY 4 (FOUR) TIMES DAILY. (AS NEEDED FOR JOINT PAIN), Disp: 400 g, Rfl: 3  diltiazem (CARDIZEM CD) 120 MG 24 hr capsule, TAKE 1 CAPSULE BY MOUTH EVERY DAY, Disp: 90 capsule, Rfl: 0   furosemide (LASIX) 40 MG tablet, Take 1 tablet (40 mg total) by mouth daily as needed for fluid or edema., Disp: 90 tablet, Rfl: 3   gabapentin (NEURONTIN) 100 MG capsule, TAKE 2 CAPSULES BY MOUTH 3 TIMES A DAY, Disp: 180 capsule, Rfl: 0   glucose blood (ACCU-CHEK AVIVA PLUS) test strip, CHECK BLOOD SUGAR ONCE A DAY  Dx E11.9, Disp: 100 strip, Rfl: 3   hydrocortisone (ANUSOL-HC) 2.5 % rectal cream, PLACE 1 APPLICATION RECTALLY AS NEEDED FOR HEMORRHOID FLARES- MAY USE 7 CONSECUTIVE DAYS, Disp: 30 g, Rfl: 1   levothyroxine  (SYNTHROID) 125 MCG tablet, TAKE 1 TABLET BY MOUTH DAILY BEFORE BREAKFAST., Disp: 90 tablet, Rfl: 2   lidocaine (LIDODERM) 5 %, Place 1 patch onto the skin daily. Remove & Discard patch within 12 hours or as directed by MD, Disp: 30 patch, Rfl: 3   lidocaine (XYLOCAINE) 2 % jelly, Apply 1 application topically 3 (three) times daily. Apply to hemorrhoid when painful, Disp: 30 mL, Rfl: 1   lisinopril (ZESTRIL) 5 MG tablet, Take 1 tablet (5 mg total) by mouth daily., Disp: 90 tablet, Rfl: 3   magnesium oxide (MAG-OX) 400 MG tablet, 400 mg., Disp: , Rfl:    meclizine (ANTIVERT) 50 MG tablet, Take 1 tablet (50 mg total) by mouth 3 (three) times daily as needed., Disp: 60 tablet, Rfl: 0   Potassium Chloride ER 20 MEQ TBCR, TAKE 1 TABLET BY MOUTH EVERY DAY, Disp: 90 tablet, Rfl: 0   PREMARIN vaginal cream, USING APPLICATOR PLACE 1 GRAM IN VAGINA EVERY OTHER NIGHT AS INSTRUCTED, Disp: 30 g, Rfl: 3   Rivaroxaban (XARELTO) 15 MG TABS tablet, TAKE 1 TABLET (15 MG TOTAL) BY MOUTH DAILY WITH SUPPER, Disp: 90 tablet, Rfl: 0   sitaGLIPtin (JANUVIA) 100 MG tablet, TAKE 1 TABLET BY MOUTH EVERY DAY, Disp: 90 tablet, Rfl: 0   TETRAHYDROZOLINE HCL OP, Apply to eye. Reported on 01/02/2016, Disp: , Rfl:  Social History   Socioeconomic History   Marital status: Divorced    Spouse name: Not on file   Number of children: 2   Years of education: 12   Highest education level: High school graduate  Occupational History   Occupation: Retired  Tobacco Use   Smoking status: Never   Smokeless tobacco: Never  Scientific laboratory technician Use: Never used  Substance and Sexual Activity   Alcohol use: No   Drug use: No   Sexual activity: Not Currently  Other Topics Concern   Not on file  Social History Narrative   Lives alone. Children live nearby   Social Determinants of Health   Financial Resource Strain: Low Risk  (11/02/2021)   Overall Financial Resource Strain (CARDIA)    Difficulty of Paying Living Expenses: Not very  hard  Food Insecurity: No Food Insecurity (11/02/2021)   Hunger Vital Sign    Worried About Running Out of Food in the Last Year: Never true    Ran Out of Food in the Last Year: Never true  Transportation Needs: No Transportation Needs (11/02/2021)   PRAPARE - Hydrologist (Medical): No    Lack of Transportation (Non-Medical): No  Physical Activity: Inactive (11/02/2021)   Exercise Vital Sign    Days of Exercise per Week: 0 days    Minutes of Exercise per Session: 0 min  Stress: No Stress  Concern Present (11/02/2021)   [place]    Feeling of Stress : Only a little  Social Connections: Moderately Isolated (11/02/2021)   Social Connection and Isolation Panel [NHANES]    Frequency of Communication with Friends and Family: More than three times a week    Frequency of Social Gatherings with Friends and Family: More than three times a week    Attends Religious Services: Never    Marine scientist or Organizations: Yes    Attends Music therapist: More than 4 times per year    Marital Status: Divorced  Intimate Partner Violence: Not At Risk (11/02/2021)   Humiliation, Afraid, Rape, and Kick questionnaire    Fear of Current or Ex-Partner: No    Emotionally Abused: No    Physically Abused: No    Sexually Abused: No   Family History  Problem Relation Age of Onset   Stroke Mother    Diabetes Mother    Heart disease Father    Atrial fibrillation Father    Cancer Sister        ovarian / colon   Diabetes Sister    Heart disease Brother    Hyperlipidemia Brother    Hypertension Brother    Diabetes Brother    Heart attack Brother 56       had to perform emergency surgery   Dementia Brother    Hyperlipidemia Sister    Cancer Sister        liver   Hyperlipidemia Sister    Diabetes Sister        [place]erline DM   Cancer Sister    Diabetes Sister    Diabetes Daughter    Rheum  arthritis Daughter    Psoriasis Daughter     Objective: Office vital signs reviewed. BP (!) 141/79   Pulse 100   Temp 98.7 F (37.1 C)   Ht 5' 4"$  (1.626 m)   Wt 157 lb (71.2 kg)   SpO2 97%   BMI 26.95 kg/m   Physical Examination:  General: Awake, alert, well nourished, No acute distress HEENT:sclera white, MMM Cardio: Irregularly irregular with rate control, S1S2 heard, no murmurs appreciated Pulm: clear to auscultation bilaterally, no wheezes, rhonchi or rales; normal work of breathing on room air MSK: Ambulating independently with normal gait and station  Assessment/ Plan: 82 y.o. female   Type 2 diabetes mellitus with stage 3a chronic kidney disease, without long-term current use of insulin ([place]) - Plan: metFORMIN (GLUCOPHAGE-XR) 500 MG 24 hr tablet  Hypothyroidism due to acquired atrophy of thyroid  Hyperlipidemia associated with type 2 diabetes mellitus ([place]) - Plan: pravastatin (PRAVACHOL) 20 MG tablet  Hypertension associated with diabetes ([place])  Statin-induced myositis  Sugar is not controlled with A1c rising to 8.2.  We discussed options including GLP, insulin.  She would like to retry metformin.  Renal function is appropriate so I see no harm in trialing this.  I am going to reassess her in 1 month but in the meantime I would like her to be checking her blood sugar once in the morning and once at night every day.  Log given to record.  I counseled her at length on reducing sugar intake.  She really is not compliant with diet  Thyroid levels were normal.  I reviewed this with her.  No changes to medication regimen  Given statin induced myositis and intolerance to Zetia I am going to trial a very mild statin  dose, pravastatin 20 mg and if this works out well for her, we will continue this dose.  Triglycerides were quite high on this fasting lipid panel so I think that some type of statin and /or fibrate may be needed  Blood pressure borderline but controlled for  age  No orders of the defined types were placed in this encounter.  No orders of the defined types were placed in this encounter.    Janora Norlander, DO [place] [phone]

## 2022-09-01 ENCOUNTER — Other Ambulatory Visit: Payer: Self-pay | Admitting: Family Medicine

## 2022-09-01 DIAGNOSIS — I10 Essential (primary) hypertension: Secondary | ICD-10-CM

## 2022-09-06 ENCOUNTER — Ambulatory Visit: Payer: Medicare Other

## 2022-09-07 ENCOUNTER — Ambulatory Visit (INDEPENDENT_AMBULATORY_CARE_PROVIDER_SITE_OTHER): Payer: Medicare Other | Admitting: Family Medicine

## 2022-09-07 ENCOUNTER — Encounter: Payer: Self-pay | Admitting: Family Medicine

## 2022-09-07 VITALS — BP 170/78 | HR 86 | Temp 97.1°F | Ht 64.0 in | Wt 156.5 lb

## 2022-09-07 DIAGNOSIS — S1096XA Insect bite of unspecified part of neck, initial encounter: Secondary | ICD-10-CM

## 2022-09-07 DIAGNOSIS — I152 Hypertension secondary to endocrine disorders: Secondary | ICD-10-CM | POA: Diagnosis not present

## 2022-09-07 DIAGNOSIS — W57XXXA Bitten or stung by nonvenomous insect and other nonvenomous arthropods, initial encounter: Secondary | ICD-10-CM | POA: Diagnosis not present

## 2022-09-07 DIAGNOSIS — E1159 Type 2 diabetes mellitus with other circulatory complications: Secondary | ICD-10-CM

## 2022-09-07 MED ORDER — DOXYCYCLINE HYCLATE 100 MG PO TABS: ORAL_TABLET | ORAL | 0 refills | Status: DC

## 2022-09-07 NOTE — Patient Instructions (Signed)
Tick Bite Information, Adult  Ticks are insects that draw blood for food. They climb onto people and animals that brush against the leaves and grasses that they live in. They then bite and attach to the skin. Most ticks are harmless, but some ticks may carry germs that can cause disease. These germs are spread to a person through a bite. To lower your risk of getting a disease from a tick bite, make sure you: Take steps to prevent tick bites. Check for ticks after being outdoors where ticks live. Watch for symptoms of disease if a tick attached to you or if you think a tick bit you. How can I prevent tick bites? Take these steps to help prevent tick bites when you go outdoors in an area where ticks live: Before you go outdoors: Wear long sleeves and long pants to protect your skin from ticks. Wear light-colored clothing so you can see ticks easier. Tuck your pant legs into your socks. Apply insect repellent that has DEET (20% or higher), picaridin, or IR3535 in it to the following areas: Any bare skin. Avoid areas around the eyes and mouth. Edges of clothing, like the top of your boots, the bottom of your pant legs, and your sleeve cuffs. Consider applying an insect repellant that contains permethrin. Follow the instructions on the label. Do not apply permethrin directly to the skin. Instead, apply to the following areas: Clothing and shoes. Outdoor gear and tents. When you are outdoors: Avoid walking through areas with long grass. If you are walking on a trail, stay in the middle of the trail so your skin, hair, and clothing do not touch the bushes. Check for ticks on your clothing, hair, and skin often while you are outdoors. Check again before you go inside. When you go indoors: Check your clothing for ticks. Tumble dry clothes in a dryer on high heat for at least 10 minutes. If clothes are damp, additional time may be needed. If clothes require washing, use hot water. Check your gear and  pets. Shower soon after being outdoors. Check your body for ticks. Do a full body check using a mirror. Be sure to check your scalp, neck, armpits, waist, groin, and joint areas. These are the spots where ticks attach themselves most often. What is the best way to remove a tick?  Remove the tick as soon as possible. Removing it can prevent germs from passing to your body. Do not remove the tick with your bare fingers. Do not try to remove a tick with heat, alcohol, petroleum jelly, or fingernail polish. These things can cause the tick to salivate and regurgitate into your bloodstream, increasing your risk of getting a disease. To remove a tick that is crawling on your skin: Go outside and brush the tick off. Use tape or a lint roller. To remove a tick that is attached to your skin: Wash your hands. If you have gloves, put them on. Use a fine-tipped tweezer, curved forceps, or a tick-removal tool to gently grasp the tick as close to your skin and the tick's head as possible. Gently pull with a steady, upward, and even pressure until the tick lets go. While removing the tick: Take care to keep the tick's head attached to its body. Do not twist or jerk the tick. This can make the tick's head or mouth parts break off and stay in your skin. If this happens, try to remove the mouth parts with tweezers. If you cannot remove them, leave   the area alone and let the skin heal. Do not squeeze or crush the tick's body. This could force disease-carrying fluids from the tick into your body. What should I do after removing a tick? Clean the bite area and your hands with soap and water, rubbing alcohol, or an iodine scrub. If an antiseptic cream or ointment is available, put a small amount on the bite area. Wash and disinfect any tools that you used to remove the tick. How should I dispose of a tick? To dispose of a live tick, use one of these methods: Place it in rubbing alcohol. Place it in a sealed bag  or container, and throw it away. Wrap it tightly in tape, and throw it away. Flush it down the toilet. Where to find more information Centers for Disease Control and Prevention: cdc.gov/ticks U.S. Environmental Protection Agency: epa.gov/insect-repellents Contact a health care provider if: You have symptoms of a disease after a tick bite. Symptoms of a tick-borne disease can occur from moments after the tick bites to 30 days after a tick is removed. Symptoms include: Fever or chills. A red rash that makes a circle (bull's-eye rash) in the bite area. Redness and swelling in the bite area. Headache or stiff neck. Muscle, joint, or bone pain. Abnormal tiredness. Numbness in your legs or trouble walking or moving your legs. Tender or swollen lymph glands. Abdominal pain, vomiting, diarrhea, or weight loss. Get help right away if: You are not able to remove a tick. You have muscle weakness or paralysis. Your symptoms get worse or you experience new symptoms. You find an engorged tick on your skin and you are in an area where there is a higher risk of disease from ticks. Summary Ticks may carry germs that can spread to a person through a bite. These germs can cause disease. Wear protective clothing and use insect repellent to prevent tick bites. Follow the instructions on the label. If you find a tick on your body, remove it as soon as possible. If the tick is attached, do not try to remove it with heat, alcohol, petroleum jelly, or fingernail polish. If you have symptoms of a disease after being bitten by a tick, contact a health care provider. This information is not intended to replace advice given to you by your health care provider. Make sure you discuss any questions you have with your health care provider. Document Revised: 09/18/2021 Document Reviewed: 09/18/2021 Elsevier Patient Education  2023 Elsevier Inc.  

## 2022-09-07 NOTE — Progress Notes (Signed)
   Acute Office Visit  Subjective:     Patient ID: Meredith Anderson, female    DOB: [birth year], 82 y.o.   MRN: [id number]  Chief Complaint  Patient presents with   Tick Removal    HPI Patient is in today for removal of an attached tick on the left side of her neck for 2-3 days. She did not realize that it was there. She just notice a lump with tenderness and itching. She denies HA, neck pain, change is vision, myalgias, joint pain, swelling, dizziness, weakness or confusion.   ROS As per HPI.      Objective:    BP (!) 170/78   Pulse 86   Temp (!) 97.1 F (36.2 C) (Temporal)   Ht 5\' 4"  (1.626 m)   Wt 156 lb 8 oz (71 kg)   SpO2 96%   BMI 26.86 kg/m  BP Readings from Last 3 Encounters:  09/07/22 (!) 170/78  08/22/22 (!) 141/79  06/05/22 137/78      Physical Exam Nursing note reviewed.  Constitutional:      General: She is not in acute distress.    Appearance: Normal appearance. She is not toxic-appearing or diaphoretic.  Neck:     Comments: Attached tick to left side of neck. No erythema migrans present.  Cardiovascular:     Rate and Rhythm: Normal rate and regular rhythm.     Heart sounds: Normal heart sounds. No murmur heard. Pulmonary:     Effort: Pulmonary effort is normal. No respiratory distress.     Breath sounds: Normal breath sounds.  Musculoskeletal:     Cervical back: Neck supple. No rigidity.  Skin:    General: Skin is warm and dry.  Neurological:     General: No focal deficit present.     Mental Status: She is alert and oriented to person, place, and time.     Motor: No weakness.     Gait: Gait normal.  Psychiatric:        Mood and Affect: Mood normal.        Behavior: Behavior normal.     Foreign Body Removal  Date/Time: 09/07/2022 4:07 PM  Performed by: Gwenlyn Perking, FNP Authorized by: Gwenlyn Perking, FNP  Intake: neck. Anesthesia method: none.  Sedation: Patient sedated: no  Patient restrained: no Patient cooperative:  yes Complexity: simple 1 objects recovered. Objects recovered: tick Post-procedure assessment: foreign body removed Patient tolerance: patient tolerated the procedure well with no immediate complications    No results found for any visits on 09/07/22.      Assessment & Plan:   Meredith Anderson was seen today for tick removal.  Diagnoses and all orders for this visit:  Tick bite of neck, initial encounter [place] tick removal today. Doxycyline for prophylaxis ordered. Discussed hydration as doxycyline does cause her diarrhea. Discussed return precautions.  -     doxycycline (VIBRA-TABS) 100 MG tablet; Take 2 tablet by mouth once. -     Foreign Body Removal  Primary hypertension Elevated today in office, normally well controlled. Monitor BP at home and notify for elevated home readings.   The patient indicates understanding of these issues and agrees with the plan.  Gwenlyn Perking, FNP

## 2022-09-11 DIAGNOSIS — I480 Paroxysmal atrial fibrillation: Secondary | ICD-10-CM | POA: Diagnosis not present

## 2022-09-21 ENCOUNTER — Ambulatory Visit (INDEPENDENT_AMBULATORY_CARE_PROVIDER_SITE_OTHER): Payer: Medicare Other | Admitting: Family Medicine

## 2022-09-21 ENCOUNTER — Encounter: Payer: Self-pay | Admitting: Family Medicine

## 2022-09-21 VITALS — BP 145/79 | HR 89 | Temp 98.6°F | Ht 64.0 in | Wt 156.0 lb

## 2022-09-21 DIAGNOSIS — E1169 Type 2 diabetes mellitus with other specified complication: Secondary | ICD-10-CM

## 2022-09-21 DIAGNOSIS — Z7901 Long term (current) use of anticoagulants: Secondary | ICD-10-CM | POA: Diagnosis not present

## 2022-09-21 DIAGNOSIS — E1122 Type 2 diabetes mellitus with diabetic chronic kidney disease: Secondary | ICD-10-CM | POA: Diagnosis not present

## 2022-09-21 DIAGNOSIS — E785 Hyperlipidemia, unspecified: Secondary | ICD-10-CM

## 2022-09-21 DIAGNOSIS — Z950 Presence of cardiac pacemaker: Secondary | ICD-10-CM | POA: Diagnosis not present

## 2022-09-21 DIAGNOSIS — N1831 Chronic kidney disease, stage 3a: Secondary | ICD-10-CM | POA: Diagnosis not present

## 2022-09-21 DIAGNOSIS — I4891 Unspecified atrial fibrillation: Secondary | ICD-10-CM | POA: Diagnosis not present

## 2022-09-21 DIAGNOSIS — J301 Allergic rhinitis due to pollen: Secondary | ICD-10-CM | POA: Diagnosis not present

## 2022-09-21 DIAGNOSIS — Z7984 Long term (current) use of oral hypoglycemic drugs: Secondary | ICD-10-CM

## 2022-09-21 DIAGNOSIS — I480 Paroxysmal atrial fibrillation: Secondary | ICD-10-CM | POA: Diagnosis not present

## 2022-09-21 MED ORDER — DAPAGLIFLOZIN PROPANEDIOL 10 MG PO TABS: 10.0000 mg | ORAL_TABLET | Freq: Every day | ORAL | 3 refills | Status: DC

## 2022-09-21 MED ORDER — PIOGLITAZONE HCL 15 MG PO TABS: 15.0000 mg | ORAL_TABLET | Freq: Every day | ORAL | 0 refills | Status: DC

## 2022-09-21 NOTE — Progress Notes (Signed)
Subjective: CC:DM PCP: Janora Norlander, DO IP:[id number] H Chavis is a 82 y.o. female presenting to clinic today for:  1. Type 2 Diabetes with hypertension, hyperlipidemia:  We added pravastatin and low-dose metformin at last visit.  She is here for interval follow-up to review blood sugars.  She discontinued the metformin because it started causing GI issues.  She went back on [place].  For some reason she thought that she was post to discontinue this.  She notes that blood sugars really have not been changed at all but she forgot to bring her sugar log in today.  She continues to have muscle aches with the Pravachol.  Last A1c:  Lab Results  Component Value Date   HGBA1C 8.2 (H) 08/20/2022   Nephropathy screen indicated?: UTD Last flu, zoster and/or pneumovax:  Immunization History  Administered Date(s) Administered   Fluad Quad(high Dose 65+) 03/25/2019, 04/27/2020, 05/22/2022   Influenza Whole 04/01/2012   Influenza, High Dose Seasonal PF 04/17/2017, 04/16/2018   Influenza, Seasonal, Injecte, Preservative Fre 03/28/2010, 05/29/2011, 04/03/2012   Influenza,inj,Quad PF,6+ Mos 04/03/2013, 04/03/2016   Influenza-Unspecified 03/15/2014   Moderna Sars-Covid-2 Vaccination 07/21/2019, 08/18/2019, 06/16/2020   Pneumococcal Conjugate-13 05/17/2014   Pneumococcal Polysaccharide-23 08/02/2009   Tdap 01/31/2012, 02/21/2012   Zoster Recombinat (Shingrix) 02/28/2021   Zoster, Live 01/31/2012, 02/21/2012   2.  Allergic rhinitis She reports ongoing allergy symptoms due to pollen despite use of Astelin.  Not on any oral antihistamines.  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    Cataract    Depression 12/12/2015   Dyshydrosis    Hemorrhoids    Hyperlipidemia    Hypertension    Menopause    NIDDM (non-insulin dependent diabetes mellitus)    diet controlled    Osteopenia 2016   Syncope 11/08/2015   Thyroid disease     Current Outpatient Medications:    acetaminophen (TYLENOL) 500 MG tablet, Take by mouth., Disp: , Rfl:    azelastine (ASTELIN) 0.1 % nasal spray, PLACE 1 SPRAY INTO BOTH NOSTRILS 2 (TWO) TIMES DAILY., Disp: 30 mL, Rfl: 4   cholecalciferol (VITAMIN D) 1000 UNITS tablet, Take 1,000 Units by mouth daily., Disp: , Rfl:    dapagliflozin propanediol (FARXIGA) 10 MG TABS tablet, Take 1 tablet (10 mg total) by mouth daily before breakfast. To REPLACE jardiance, Disp: 90 tablet, Rfl: 3   diclofenac Sodium (VOLTAREN) 1 % GEL, APPLY 2 GRAMS TOPICALLY 4 (FOUR) TIMES DAILY. (AS NEEDED FOR JOINT PAIN), Disp: 400 g, Rfl: 3   diltiazem (CARDIZEM CD) 120 MG 24 hr capsule, TAKE 1 CAPSULE BY MOUTH EVERY DAY, Disp: 90 capsule, Rfl: 0   doxycycline (VIBRA-TABS) 100 MG tablet, Take 2 tablet by mouth once., Disp: 2 tablet, Rfl: 0   furosemide (LASIX) 40 MG tablet, Take 1 tablet (40 mg  total) by mouth daily as needed for fluid or edema., Disp: 90 tablet, Rfl: 3   gabapentin (NEURONTIN) 100 MG capsule, TAKE 2 CAPSULES BY MOUTH 3 TIMES A DAY, Disp: 180 capsule, Rfl: 0   glucose blood (ACCU-CHEK AVIVA PLUS) test strip, CHECK BLOOD SUGAR ONCE A DAY  Dx E11.9, Disp: 100 strip, Rfl: 3   hydrocortisone (ANUSOL-HC) 2.5 % rectal cream, PLACE 1 APPLICATION RECTALLY AS NEEDED FOR HEMORRHOID FLARES- MAY USE 7 CONSECUTIVE DAYS, Disp: 30 g, Rfl: 1   levothyroxine (SYNTHROID) 125 MCG tablet, TAKE 1 TABLET BY MOUTH DAILY BEFORE BREAKFAST., Disp: 90 tablet, Rfl: 2    lidocaine (LIDODERM) 5 %, Place 1 patch onto the skin daily. Remove & Discard patch within 12 hours or as directed by MD, Disp: 30 patch, Rfl: 3   lidocaine (XYLOCAINE) 2 % jelly, Apply 1 application topically 3 (three) times daily. Apply to hemorrhoid when painful, Disp: 30 mL, Rfl: 1   lisinopril (ZESTRIL) 5 MG tablet, Take 1 tablet (5 mg total) by mouth daily., Disp: 90 tablet, Rfl: 3   magnesium oxide (MAG-OX) 400 MG tablet, 400 mg., Disp: , Rfl:    meclizine (ANTIVERT) 50 MG tablet, Take 1 tablet (50 mg total) by mouth 3 (three) times daily as needed., Disp: 60 tablet, Rfl: 0   metFORMIN (GLUCOPHAGE-XR) 500 MG 24 hr tablet, Take 1 tablet (500 mg total) by mouth daily with supper., Disp: 90 tablet, Rfl: 0   Potassium Chloride ER 20 MEQ TBCR, TAKE 1 TABLET BY MOUTH EVERY DAY, Disp: 90 tablet, Rfl: 0   pravastatin (PRAVACHOL) 20 MG tablet, Take 1 tablet (20 mg total) by mouth daily. For cholesterol, Disp: 90 tablet, Rfl: 3   PREMARIN vaginal cream, USING APPLICATOR PLACE 1 GRAM IN VAGINA EVERY OTHER NIGHT AS INSTRUCTED, Disp: 30 g, Rfl: 3   Rivaroxaban (XARELTO) 15 MG TABS tablet, TAKE 1 TABLET (15 MG TOTAL) BY MOUTH DAILY WITH SUPPER, Disp: 90 tablet, Rfl: 0   sitaGLIPtin (JANUVIA) 100 MG tablet, TAKE 1 TABLET BY MOUTH EVERY DAY, Disp: 90 tablet, Rfl: 0   TETRAHYDROZOLINE HCL OP, Apply to eye. Reported on 01/02/2016, Disp: , Rfl:  Social History   Socioeconomic History   Marital status: Divorced    Spouse name: Not on file   Number of children: 2   Years of education: 12   Highest education level: High school graduate  Occupational History   Occupation: Retired  Tobacco Use   Smoking status: Never   Smokeless tobacco: Never  Scientific laboratory technician Use: Never used  Substance and Sexual Activity   Alcohol use: No   Drug use: No   Sexual activity: Not Currently  Other Topics Concern   Not on file  Social History Narrative   Lives alone. Children live nearby   Social Determinants of  Health   Financial Resource Strain: Low Risk  (11/02/2021)   Overall Financial Resource Strain (CARDIA)    Difficulty of Paying Living Expenses: Not very hard  Food Insecurity: No Food Insecurity (11/02/2021)   Hunger Vital Sign    Worried About Running Out of Food in the Last Year: Never true    Ran Out of Food in the Last Year: Never true  Transportation Needs: No Transportation Needs (11/02/2021)   PRAPARE - Hydrologist (Medical): No    Lack of Transportation (Non-Medical): No  Physical Activity: Inactive (11/02/2021)   Exercise Vital Sign    Days of Exercise per Week:  0 days    Minutes of Exercise per Session: 0 min  Stress: No Stress Concern Present (11/02/2021)   [place]    Feeling of Stress : Only a little  Social Connections: Moderately Isolated (11/02/2021)   Social Connection and Isolation Panel [NHANES]    Frequency of Communication with Friends and Family: More than three times a week    Frequency of Social Gatherings with Friends and Family: More than three times a week    Attends Religious Services: Never    Marine scientist or Organizations: Yes    Attends Music therapist: More than 4 times per year    Marital Status: Divorced  Intimate Partner Violence: Not At Risk (11/02/2021)   Humiliation, Afraid, Rape, and Kick questionnaire    Fear of Current or Ex-Partner: No    Emotionally Abused: No    Physically Abused: No    Sexually Abused: No   Family History  Problem Relation Age of Onset   Stroke Mother    Diabetes Mother    Heart disease Father    Atrial fibrillation Father    Cancer Sister        ovarian / colon   Diabetes Sister    Heart disease Brother    Hyperlipidemia Brother    Hypertension Brother    Diabetes Brother    Heart attack Brother 37       had to perform emergency surgery   Dementia Brother    Hyperlipidemia Sister    Cancer Sister         liver   Hyperlipidemia Sister    Diabetes Sister        Boarderline DM   Cancer Sister    Diabetes Sister    Diabetes Daughter    Rheum arthritis Daughter    Psoriasis Daughter     Objective: Office vital signs reviewed. BP (!) 145/79   Pulse 89   Temp 98.6 F (37 C)   Ht 5\' 4"  (1.626 m)   Wt 156 lb (70.8 kg)   SpO2 95%   BMI 26.78 kg/m   Physical Examination:  General: Awake, alert, nontoxic-appearing female, No acute distress HEENT:sclera white, MMM.  No facial swelling or gross rhinorrhea appreciated Cardio: regular rate and rhythm  Pulm:  normal work of breathing on room air    Assessment/ Plan: 82 y.o. female   Type 2 diabetes mellitus with stage 3a chronic kidney disease, without long-term current use of insulin ([place]) - Plan: pioglitazone (ACTOS) 15 MG tablet, dapagliflozin propanediol (FARXIGA) 10 MG TABS tablet  Hyperlipidemia associated with type 2 diabetes mellitus (H[facility])  Seasonal allergic rhinitis due to pollen  Start Actos 15 mg daily.  Continue Kelle Darting.  Metformin discontinued.  Stressed the importance of diet modification to reduce blood sugar.  If not able to get her sugar under control with this regimen, we discussed that we will have to do something that injected  She will continue statin for now.  I discussed she could trial off for 1 week to see if reported myalgias and arthralgias are resolved but if not then she is to resume use of the statin.  If so, she will contact me and we can replace with alternative  Claritin recommended for allergic rhinitis.  Continue Astelin as directed  No orders of the defined types were placed in this encounter.  No orders of the defined types were placed in this encounter.  Janora Norlander, DO [facility] [phone]

## 2022-09-21 NOTE — Patient Instructions (Signed)
Loratidine (Claritin) 10mg  daily for allergies Continue nasal spray as needed Pioglitazone added for sugar.  Take each morning with breakfast CONTINUE the [name] and Januvia OK to hold Pravachol for 1 week.  If muscle pain doesn't go away, go back on Pravachol.  If it DOES go away, call me and I will replace the cholesterol medication with something different.

## 2022-10-01 ENCOUNTER — Other Ambulatory Visit: Payer: Self-pay | Admitting: Family Medicine

## 2022-10-01 ENCOUNTER — Telehealth: Payer: Self-pay | Admitting: Family Medicine

## 2022-10-01 DIAGNOSIS — M609 Myositis, unspecified: Secondary | ICD-10-CM

## 2022-10-01 DIAGNOSIS — E034 Atrophy of thyroid (acquired): Secondary | ICD-10-CM

## 2022-10-01 DIAGNOSIS — E1169 Type 2 diabetes mellitus with other specified complication: Secondary | ICD-10-CM

## 2022-10-01 DIAGNOSIS — G8929 Other chronic pain: Secondary | ICD-10-CM

## 2022-10-01 MED ORDER — EZETIMIBE 10 MG PO TABS: 10.0000 mg | ORAL_TABLET | Freq: Every day | ORAL | 3 refills | Status: DC

## 2022-10-01 NOTE — Telephone Encounter (Signed)
Patient would like to speak to nurse about a recent change in her medication. No other information given.

## 2022-10-01 NOTE — Telephone Encounter (Signed)
Replaced with Zetia.  Please inform her of change.  Plan for repeat fasting lab/ LFTs in 3 months.

## 2022-10-01 NOTE — Telephone Encounter (Signed)
Pt has been informed and understood. Future lab orders placed. Pt states that she has an appt in May and will come one day prior and have fasting labs.

## 2022-10-01 NOTE — Telephone Encounter (Signed)
Unable to take Actos, upset stomach   Held Pravastatin for one week and muscle pain improved. States that Dr. Lajuana Ripple was going to change to a different medication if she improved while off of the medication.

## 2022-10-01 NOTE — Progress Notes (Signed)
ze

## 2022-10-04 ENCOUNTER — Other Ambulatory Visit: Payer: Self-pay | Admitting: Family Medicine

## 2022-10-04 DIAGNOSIS — I152 Hypertension secondary to endocrine disorders: Secondary | ICD-10-CM

## 2022-10-04 DIAGNOSIS — E1169 Type 2 diabetes mellitus with other specified complication: Secondary | ICD-10-CM

## 2022-10-04 DIAGNOSIS — E1159 Type 2 diabetes mellitus with other circulatory complications: Secondary | ICD-10-CM

## 2022-11-04 ENCOUNTER — Other Ambulatory Visit: Payer: Self-pay | Admitting: Family Medicine

## 2022-11-07 ENCOUNTER — Ambulatory Visit (INDEPENDENT_AMBULATORY_CARE_PROVIDER_SITE_OTHER): Payer: Medicare Other

## 2022-11-07 VITALS — Ht 64.0 in | Wt 152.0 lb

## 2022-11-07 DIAGNOSIS — Z0000 Encounter for general adult medical examination without abnormal findings: Secondary | ICD-10-CM

## 2022-11-07 NOTE — Progress Notes (Signed)
Subjective:   Meredith Anderson is a 82 y.o. female who presents for Medicare Annual (Subsequent) preventive examination. I connected with  Marlin Canary on 11/07/22 by a audio enabled telemedicine application and verified that I am speaking with the correct person using two identifiers.  Patient Location: Home  Provider Location: Home Office  I discussed the limitations of evaluation and management by telemedicine. The patient expressed understanding and agreed to proceed.  Review of Systems     Cardiac Risk Factors include: advanced age (>43men, >37 women);diabetes mellitus;hypertension;dyslipidemia     Objective:    Today's Vitals   11/07/22 1114  Weight: 152 lb (68.9 kg)  Height: 5\' 4"  (1.626 m)   Body mass index is 26.09 kg/m.     11/07/2022   11:17 AM 05/02/2022    1:19 PM 11/02/2021   10:41 AM 11/01/2020   11:08 AM 10/29/2019    2:46 PM 10/28/2018    2:42 PM 05/23/2016    3:43 PM  Advanced Directives  Does Patient Have a Medical Advance Directive? No No No No No No No  Would patient like information on creating a medical advance directive? No - Patient declined  No - Patient declined No - Patient declined Yes (MAU/Ambulatory/Procedural Areas - Information given) No - Patient declined No - Patient declined    Current Medications (verified) Outpatient Encounter Medications as of 11/07/2022  Medication Sig   acetaminophen (TYLENOL) 500 MG tablet Take by mouth.   azelastine (ASTELIN) 0.1 % nasal spray PLACE 1 SPRAY INTO BOTH NOSTRILS 2 (TWO) TIMES DAILY.   cholecalciferol (VITAMIN D) 1000 UNITS tablet Take 1,000 Units by mouth daily.   dapagliflozin propanediol (FARXIGA) 10 MG TABS tablet Take 1 tablet (10 mg total) by mouth daily before breakfast. To REPLACE jardiance   diclofenac Sodium (VOLTAREN) 1 % GEL APPLY 2 GRAMS TOPICALLY 4 (FOUR) TIMES DAILY. (AS NEEDED FOR JOINT PAIN)   diltiazem (CARDIZEM CD) 120 MG 24 hr capsule TAKE 1 CAPSULE BY MOUTH EVERY DAY   ezetimibe (ZETIA)  10 MG tablet Take 1 tablet (10 mg total) by mouth daily.   furosemide (LASIX) 40 MG tablet Take 1 tablet (40 mg total) by mouth daily as needed for fluid or edema.   gabapentin (NEURONTIN) 100 MG capsule TAKE 2 CAPSULES BY MOUTH 3 TIMES A DAY   glucose blood (ACCU-CHEK AVIVA PLUS) test strip CHECK BLOOD SUGAR ONCE A DAY  Dx E11.9   hydrocortisone (ANUSOL-HC) 2.5 % rectal cream PLACE 1 APPLICATION RECTALLY AS NEEDED FOR HEMORRHOID FLARES- MAY USE 7 CONSECUTIVE DAYS   JANUVIA 100 MG tablet TAKE 1 TABLET BY MOUTH EVERY DAY   levothyroxine (SYNTHROID) 125 MCG tablet TAKE 1 TABLET BY MOUTH DAILY BEFORE BREAKFAST.   lidocaine (LIDODERM) 5 % Place 1 patch onto the skin daily. Remove & Discard patch within 12 hours or as directed by MD   lidocaine (XYLOCAINE) 2 % jelly Apply 1 application topically 3 (three) times daily. Apply to hemorrhoid when painful   lisinopril (ZESTRIL) 5 MG tablet Take 1 tablet (5 mg total) by mouth daily.   magnesium oxide (MAG-OX) 400 MG tablet 400 mg.   meclizine (ANTIVERT) 50 MG tablet Take 1 tablet (50 mg total) by mouth 3 (three) times daily as needed.   pioglitazone (ACTOS) 15 MG tablet Take 1 tablet (15 mg total) by mouth daily. For sugar.  To REPLACE metformin   Potassium Chloride ER 20 MEQ TBCR TAKE 1 TABLET BY MOUTH EVERY DAY   PREMARIN  vaginal cream USING APPLICATOR PLACE 1 GRAM IN VAGINA EVERY OTHER NIGHT AS INSTRUCTED   TETRAHYDROZOLINE HCL OP Apply to eye. Reported on 01/02/2016   XARELTO 15 MG TABS tablet TAKE 1 TABLET (15 MG TOTAL) BY MOUTH DAILY WITH SUPPER   doxycycline (VIBRA-TABS) 100 MG tablet Take 2 tablet by mouth once. (Patient not taking: Reported on 11/07/2022)   No facility-administered encounter medications on file as of 11/07/2022.    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    Cataract    Depression 12/12/2015   Dyshydrosis    Hemorrhoids    Hyperlipidemia    Hypertension    Menopause    NIDDM (non-insulin dependent diabetes mellitus)    diet controlled    Osteopenia 2016   Syncope 11/08/2015   Thyroid disease    Past Surgical History:  Procedure Laterality Date   ABDOMINAL HYSTERECTOMY     partial, prolapse   ABLATION     bunionectomy bilateral     EYE SURGERY Bilateral    cataracts   LUMBAR DISC SURGERY     L4 and L5   PACEMAKER INSERTION  12/14/2015   ROTATOR CUFF REPAIR Right    TUBAL LIGATION     Family History  Problem Relation Age of Onset   Stroke Mother    Diabetes Mother    Heart disease Father    Atrial fibrillation Father    Cancer Sister        ovarian / colon   Diabetes Sister    Heart disease Brother    Hyperlipidemia Brother    Hypertension Brother    Diabetes Brother    Heart attack Brother 28       had to perform emergency surgery   Dementia Brother    Hyperlipidemia Sister    Cancer Sister        liver   Hyperlipidemia Sister    Diabetes Sister        Boarderline DM   Cancer Sister    Diabetes Sister    Diabetes Daughter    Rheum arthritis Daughter    Psoriasis Daughter    Social History   Socioeconomic History   Marital status: Divorced    Spouse name: Not on file   Number of children: 2   Years of education: 12   Highest education level: High school graduate  Occupational History   Occupation: Retired  Tobacco Use   Smoking status: Never   Smokeless tobacco: Never  Building services engineer Use: Never used  Substance and Sexual Activity   Alcohol use: No   Drug use: No   Sexual activity: Not Currently  Other Topics Concern   Not on file  Social History Narrative   Lives alone. Children live nearby   Social Determinants of Health   Financial Resource Strain: Low Risk  (11/07/2022)   Overall Financial Resource Strain (CARDIA)     Difficulty of Paying Living Expenses: Not hard at all  Food Insecurity: No Food Insecurity (11/07/2022)   Hunger Vital Sign    Worried About Running Out of Food in the Last Year: Never true    Ran Out of Food in the Last Year: Never true  Transportation Needs: No  Transportation Needs (11/07/2022)   PRAPARE - Administrator, Civil Service (Medical): No    Lack of Transportation (Non-Medical): No  Physical Activity: Inactive (11/07/2022)   Exercise Vital Sign    Days of Exercise per Week: 0 days    Minutes of Exercise per Session: 0 min  Stress: No Stress Concern Present (11/07/2022)   [employer] of Occupational Health - Occupational Stress Questionnaire    Feeling of Stress : Not at all  Social Connections: Moderately Isolated (11/07/2022)   Social Connection and Isolation Panel [NHANES]    Frequency of Communication with Friends and Family: More than three times a week    Frequency of Social Gatherings with Friends and Family: More than three times a week    Attends Religious Services: Never    Database administrator or Organizations: Yes    Attends Engineer, structural: 1 to 4 times per year    Marital Status: Divorced    Tobacco Counseling Counseling given: Not Answered   Clinical Intake:  Pre-visit preparation completed: Yes  Pain : No/denies pain     Nutritional Risks: None Diabetes: Yes CBG done?: No Did pt. bring in CBG monitor from home?: No  How often do you need to have someone help you when you read instructions, pamphlets, or other written materials from your doctor or pharmacy?: 1 - Never  Diabetic?yes  Nutrition Risk Assessment:  Has the patient had any N/V/D within the last 2 months?  No  Does the patient have any non-healing wounds?  No  Has the patient had any unintentional weight loss or weight gain?  No   Diabetes:  Is the patient diabetic?  Yes  If diabetic, was a CBG obtained today?  No  Did the patient bring in their  glucometer from home?  No  How often do you monitor your CBG's? Daily .   Financial Strains and Diabetes Management:  Are you having any financial strains with the device, your supplies or your medication? No .  Does the patient want to be seen by Chronic Care Management for management of their diabetes?  No  Would the patient like to be referred to a Nutritionist or for Diabetic Management?  No   Diabetic Exams:  Diabetic Eye Exam: Completed 11/2021 Diabetic Foot Exam: Overdue, Pt has been advised about the importance in completing this exam. Pt is scheduled for diabetic foot exam on next office visit .   Interpreter Needed?: No  Information entered by :: Renie Ora, LPN   Activities of Daily Living    11/07/2022   11:17 AM  In your present state of health, do you have any difficulty performing the following activities:  Hearing? 0  Vision? 0  Difficulty concentrating or making decisions? 0  Walking or climbing stairs? 0  Dressing or bathing? 0  Doing errands, shopping? 0  Preparing Food and eating ? N  Using the Toilet? N  In the past six months, have you accidently leaked urine? N  Do you have problems with loss of bowel control? N  Managing your Medications? N  Managing your Finances? N  Housekeeping or managing your Housekeeping? N    Patient Care Team: Raliegh Ip, DO as PCP - General (Family Medicine) Redge Gainer, MD as Consulting Physician (Internal Medicine) Maryln Manuel, MD as Consulting Physician (Cardiology) Mathews Robinsons, MD as Consulting Physician (Dermatology) Danella Maiers, [facility] (Pharmacist)  Indicate any recent Medical Services you may have received  from other than [facility] providers in the past year (date may be approximate).     Assessment:   This is a routine wellness examination for Tekeya.  Hearing/Vision screen Vision Screening - Comments:: Wears rx glasses - up to date with routine eye exams with  Dr.Johnson   Dietary  issues and exercise activities discussed: Current Exercise Habits: The patient does not participate in regular exercise at present   Goals Addressed             This Visit's Progress    DIET - REDUCE SUGAR INTAKE   On track      Depression Screen    11/07/2022   11:16 AM 09/21/2022    1:17 PM 09/07/2022    3:48 PM 08/22/2022    2:09 PM 06/05/2022   10:31 AM 05/22/2022   12:43 PM 04/25/2022    3:10 PM  PHQ 2/9 Scores  PHQ - 2 Score 0 0 0 0 0 0 0  PHQ- 9 Score 0 0 0 0 2 0 1    Fall Risk    11/07/2022   11:15 AM 09/21/2022    1:18 PM 09/07/2022    3:48 PM 08/22/2022    2:09 PM 06/05/2022   10:32 AM  Fall Risk   Falls in the past year? 0 0 0 0 0  Number falls in past yr: 0 0  0   Injury with Fall? 0 0  0   Risk for fall due to : No Fall Risks No Fall Risks  No Fall Risks   Follow up Falls prevention discussed Education provided  Education provided     FALL RISK PREVENTION PERTAINING TO THE HOME:  Any stairs in or around the home? No  If so, are there any without handrails? No  Home free of loose throw rugs in walkways, pet beds, electrical cords, etc? Yes  Adequate lighting in your home to reduce risk of falls? Yes   ASSISTIVE DEVICES UTILIZED TO PREVENT FALLS:  Life alert? No  Use of a cane, walker or w/c? No  Grab bars in the bathroom? Yes  Shower chair or bench in shower? Yes  Elevated toilet seat or a handicapped toilet? Yes       06/28/2017    2:23 PM 05/23/2016    3:46 PM 05/20/2015    2:46 PM  MMSE - Mini Mental State Exam  Orientation to time 5 5 5   Orientation to Place 5 5 5   Registration 3 3 3   Attention/ Calculation 5 3 5   Recall 1 3 3   Language- name 2 objects 2 2 2   Language- repeat 1 1 1   Language- follow 3 step command 3 3 3   Language- read & follow direction 1 1 1   Write a sentence 1 1 1   Copy design 1 1 1   Total score 28 28 30         11/07/2022   11:17 AM 11/02/2021   10:42 AM 10/29/2019    2:50 PM 10/28/2018    2:46 PM  6CIT Screen   What Year? 0 points 0 points 0 points 0 points  What month? 0 points 0 points 0 points 0 points  What time? 0 points 0 points 0 points 0 points  Count back from 20 0 points 0 points 0 points 0 points  Months in reverse 0 points 0 points 0 points 0 points  Repeat phrase 0 points 0 points 0 points 2 points  Total Score 0 points 0 points  0 points 2 points    Immunizations Immunization History  Administered Date(s) Administered   Fluad Quad(high Dose 65+) 03/25/2019, 04/27/2020, 05/22/2022   Influenza Whole 04/01/2012   Influenza, High Dose Seasonal PF 04/17/2017, 04/16/2018   Influenza, Seasonal, Injecte, Preservative Fre 03/28/2010, 05/29/2011, 04/03/2012   Influenza,inj,Quad PF,6+ Mos 04/03/2013, 04/03/2016   Influenza-Unspecified 03/15/2014   Moderna Sars-Covid-2 Vaccination 07/21/2019, 08/18/2019, 06/16/2020   Pneumococcal Conjugate-13 05/17/2014   Pneumococcal Polysaccharide-23 08/02/2009   Tdap 01/31/2012, 02/21/2012   Zoster Recombinat (Shingrix) 02/28/2021   Zoster, Live 01/31/2012, 02/21/2012    TDAP status: Due, Education has been provided regarding the importance of this vaccine. Advised may receive this vaccine at local pharmacy or Health Dept. Aware to provide a copy of the vaccination record if obtained from local pharmacy or Health Dept. Verbalized acceptance and understanding.  Flu Vaccine status: Up to date  Pneumococcal vaccine status: Up to date  Covid-19 vaccine status: Completed vaccines  Qualifies for Shingles Vaccine? Yes   Zostavax completed Yes   Shingrix Completed?: Yes  Screening Tests Health Maintenance  Topic Date Due   Zoster Vaccines- Shingrix (2 of 2) 11/20/2022 (Originally 04/25/2021)   COVID-19 Vaccine (4 - 2023-24 season) 11/23/2022 (Originally 03/02/2022)   MAMMOGRAM  02/17/2023 (Originally 01/26/2022)   OPHTHALMOLOGY EXAM  11/22/2022   INFLUENZA VACCINE  01/31/2023   HEMOGLOBIN A1C  02/18/2023   FOOT EXAM  05/23/2023   Diabetic kidney  evaluation - eGFR measurement  08/21/2023   Diabetic kidney evaluation - Urine ACR  08/21/2023   Medicare Annual Wellness (AWV)  11/07/2023   Pneumonia Vaccine 68+ Years old  Completed   DEXA SCAN  Completed   HPV VACCINES  Aged Out   DTaP/Tdap/Td  Discontinued    Health Maintenance  There are no preventive care reminders to display for this patient.   Colorectal cancer screening: No longer required.   Mammogram status: No longer required due to age.  Bone Density status: Completed 05/22/2022. Results reflect: Bone density results: OSTEOPOROSIS. Repeat every 2 years.  Lung Cancer Screening: (Low Dose CT Chest recommended if Age 18-80 years, 30 pack-year currently smoking OR have quit w/in 15years.) does not qualify.   Lung Cancer Screening Referral: n/a  Additional Screening:  Hepatitis C Screening: does not qualify  Vision Screening: Recommended annual ophthalmology exams for early detection of glaucoma and other disorders of the eye. Is the patient up to date with their annual eye exam?  Yes  Who is the provider or what is the name of the office in which the patient attends annual eye exams? Dr.johnson  If pt is not established with a provider, would they like to be referred to a provider to establish care? No .   Dental Screening: Recommended annual dental exams for proper oral hygiene  Community Resource Referral / Chronic Care Management: CRR required this visit?  No   CCM required this visit?  No      Plan:     I have personally reviewed and noted the following in the patient's chart:   Medical and social history Use of alcohol, tobacco or illicit drugs  Current medications and supplements including opioid prescriptions. Patient is not currently taking opioid prescriptions. Functional ability and status Nutritional status Physical activity Advanced directives List of other physicians Hospitalizations, surgeries, and ER visits in previous 12  months Vitals Screenings to include cognitive, depression, and falls Referrals and appointments  In addition, I have reviewed and discussed with patient certain preventive protocols, quality metrics, and best  practice recommendations. A written personalized care plan for preventive services as well as general preventive health recommendations were provided to patient.     Lorrene Reid, LPN   07/07/1094   Nurse Notes: none

## 2022-11-07 NOTE — Patient Instructions (Signed)
Meredith Anderson , Thank you for taking time to come for your Medicare Wellness Visit. I appreciate your ongoing commitment to your health goals. Please review the following plan we discussed and let me know if I can assist you in the future.   These are the goals we discussed:  Goals      DIET - REDUCE SUGAR INTAKE     Exercise 3x per week (30 min per time)     11/02/2021 AWV Goal: Exercise for General Health  Patient will verbalize understanding of the benefits of increased physical activity: Exercising regularly is important. It will improve your overall fitness, flexibility, and endurance. Regular exercise also will improve your overall health. It can help you control your weight, reduce stress, and improve your bone density. Over the next year, patient will increase physical activity as tolerated with a goal of at least 150 minutes of moderate physical activity per week.  You can tell that you are exercising at a moderate intensity if your heart starts beating faster and you start breathing faster but can still hold a conversation. Moderate-intensity exercise ideas include: Walking 1 mile (1.6 km) in about 15 minutes Biking Hiking Golfing Dancing Water aerobics Patient will verbalize understanding of everyday activities that increase physical activity by providing examples like the following: Yard work, such as: Insurance underwriter Gardening Washing windows or floors Patient will be able to explain general safety guidelines for exercising:  Before you start a new exercise program, talk with your health care provider. Do not exercise so much that you hurt yourself, feel dizzy, or get very short of breath. Wear comfortable clothes and wear shoes with good support. Drink plenty of water while you exercise to prevent dehydration or heat stroke. Work out until your breathing and your heartbeat get faster.       Have 3 meals a day     Eat at least 3 meals daily that consist of proteins, fruits and vegetables Increase water intake+   10/29/2019 AWV Goal: Improved Nutrition/Diet  Patient will verbalize understanding that diet plays an important role in overall health and that a poor diet is a risk factor for many chronic medical conditions.  Over the next year, patient will improve self management of their diet by incorporating decreased fat intake, fewer sweetened foods & beverages, increased physical activity, improved protein intake, better food choices, and adequate fluid intake (at least 6 cups of fluid per day). Patient will utilize available community resources to help with food acquisition if needed (ex: food pantries, [address], etc) Patient will work with nutrition specialist if a referral was made          This is a list of the screening recommended for you and due dates:  Health Maintenance  Topic Date Due   Zoster (Shingles) Vaccine (2 of 2) 11/20/2022*   COVID-19 Vaccine (4 - 2023-24 season) 11/23/2022*   Mammogram  02/17/2023*   Eye exam for diabetics  11/22/2022   Flu Shot  01/31/2023   Hemoglobin A1C  02/18/2023   Complete foot exam   05/23/2023   Yearly kidney function blood test for diabetes  08/21/2023   Yearly kidney health urinalysis for diabetes  08/21/2023   Medicare Annual Wellness Visit  11/07/2023   Pneumonia Vaccine  Completed   DEXA scan (bone density measurement)  Completed   HPV Vaccine  Aged Out   DTaP/Tdap/Td vaccine  Discontinued  *  Topic was postponed. The date shown is not the original due date.    Advanced directives: Advance directive discussed with you today. I have provided a copy for you to complete at home and have notarized. Once this is complete please bring a copy in to our office so we can scan it into your chart.   Conditions/risks identified: Aim for 30 minutes of exercise or brisk walking, 6-8 glasses of water, and 5 servings of fruits and  vegetables each day.   Next appointment: Follow up in one year for your annual wellness visit    Preventive Care 65 Years and Older, Female Preventive care refers to lifestyle choices and visits with your health care provider that can promote health and wellness. What does preventive care include? A yearly physical exam. This is also called an annual well check. Dental exams once or twice a year. Routine eye exams. Ask your health care provider how often you should have your eyes checked. Personal lifestyle choices, including: Daily care of your teeth and gums. Regular physical activity. Eating a healthy diet. Avoiding tobacco and drug use. Limiting alcohol use. Practicing safe sex. Taking low-dose aspirin every day. Taking vitamin and mineral supplements as recommended by your health care provider. What happens during an annual well check? The services and screenings done by your health care provider during your annual well check will depend on your age, overall health, lifestyle risk factors, and family history of disease. Counseling  Your health care provider may ask you questions about your: Alcohol use. Tobacco use. Drug use. Emotional well-being. Home and relationship well-being. Sexual activity. Eating habits. History of falls. Memory and ability to understand (cognition). Work and work Astronomer. Reproductive health. Screening  You may have the following tests or measurements: Height, weight, and BMI. Blood pressure. Lipid and cholesterol levels. These may be checked every 5 years, or more frequently if you are over 76 years old. Skin check. Lung cancer screening. You may have this screening every year starting at age 23 if you have a 30-pack-year history of smoking and currently smoke or have quit within the past 15 years. Fecal occult blood test (FOBT) of the stool. You may have this test every year starting at age 64. Flexible sigmoidoscopy or colonoscopy. You  may have a sigmoidoscopy every 5 years or a colonoscopy every 10 years starting at age 89. Hepatitis C blood test. Hepatitis B blood test. Sexually transmitted disease (STD) testing. Diabetes screening. This is done by checking your blood sugar (glucose) after you have not eaten for a while (fasting). You may have this done every 1-3 years. Bone density scan. This is done to screen for osteoporosis. You may have this done starting at age 68. Mammogram. This may be done every 1-2 years. Talk to your health care provider about how often you should have regular mammograms. Talk with your health care provider about your test results, treatment options, and if necessary, the need for more tests. Vaccines  Your health care provider may recommend certain vaccines, such as: Influenza vaccine. This is recommended every year. Tetanus, diphtheria, and acellular pertussis (Tdap, Td) vaccine. You may need a Td booster every 10 years. Zoster vaccine. You may need this after age 45. Pneumococcal 13-valent conjugate (PCV13) vaccine. One dose is recommended after age 55. Pneumococcal polysaccharide (PPSV23) vaccine. One dose is recommended after age 62. Talk to your health care provider about which screenings and vaccines you need and how often you need them. This information is not  intended to replace advice given to you by your health care provider. Make sure you discuss any questions you have with your health care provider. Document Released: 07/15/2015 Document Revised: 03/07/2016 Document Reviewed: 04/19/2015 Elsevier Interactive Patient Education  2017 [employer].  Fall Prevention in the Home Falls can cause injuries. They can happen to people of all ages. There are many things you can do to make your home safe and to help prevent falls. What can I do on the outside of my home? Regularly fix the edges of walkways and driveways and fix any cracks. Remove anything that might make you trip as you walk  through a door, such as a raised step or threshold. Trim any bushes or trees on the path to your home. Use bright outdoor lighting. Clear any walking paths of anything that might make someone trip, such as rocks or tools. Regularly check to see if handrails are loose or broken. Make sure that both sides of any steps have handrails. Any raised decks and porches should have guardrails on the edges. Have any leaves, snow, or ice cleared regularly. Use sand or salt on walking paths during winter. Clean up any spills in your garage right away. This includes oil or grease spills. What can I do in the bathroom? Use night lights. Install grab bars by the toilet and in the tub and shower. Do not use towel bars as grab bars. Use non-skid mats or decals in the tub or shower. If you need to sit down in the shower, use a plastic, non-slip stool. Keep the floor dry. Clean up any water that spills on the floor as soon as it happens. Remove soap buildup in the tub or shower regularly. Attach bath mats securely with double-sided non-slip rug tape. Do not have throw rugs and other things on the floor that can make you trip. What can I do in the bedroom? Use night lights. Make sure that you have a light by your bed that is easy to reach. Do not use any sheets or blankets that are too big for your bed. They should not hang down onto the floor. Have a firm chair that has side arms. You can use this for support while you get dressed. Do not have throw rugs and other things on the floor that can make you trip. What can I do in the kitchen? Clean up any spills right away. Avoid walking on wet floors. Keep items that you use a lot in easy-to-reach places. If you need to reach something above you, use a strong step stool that has a grab bar. Keep electrical cords out of the way. Do not use floor polish or wax that makes floors slippery. If you must use wax, use non-skid floor wax. Do not have throw rugs and  other things on the floor that can make you trip. What can I do with my stairs? Do not leave any items on the stairs. Make sure that there are handrails on both sides of the stairs and use them. Fix handrails that are broken or loose. Make sure that handrails are as long as the stairways. Check any carpeting to make sure that it is firmly attached to the stairs. Fix any carpet that is loose or worn. Avoid having throw rugs at the top or bottom of the stairs. If you do have throw rugs, attach them to the floor with carpet tape. Make sure that you have a light switch at the top of the stairs  and the bottom of the stairs. If you do not have them, ask someone to add them for you. What else can I do to help prevent falls? Wear shoes that: Do not have high heels. Have rubber bottoms. Are comfortable and fit you well. Are closed at the toe. Do not wear sandals. If you use a stepladder: Make sure that it is fully opened. Do not climb a closed stepladder. Make sure that both sides of the stepladder are locked into place. Ask someone to hold it for you, if possible. Clearly mark and make sure that you can see: Any grab bars or handrails. First and last steps. Where the edge of each step is. Use tools that help you move around (mobility aids) if they are needed. These include: Canes. Walkers. Scooters. Crutches. Turn on the lights when you go into a dark area. Replace any light bulbs as soon as they burn out. Set up your furniture so you have a clear path. Avoid moving your furniture around. If any of your floors are uneven, fix them. If there are any pets around you, be aware of where they are. Review your medicines with your doctor. Some medicines can make you feel dizzy. This can increase your chance of falling. Ask your doctor what other things that you can do to help prevent falls. This information is not intended to replace advice given to you by your health care provider. Make sure you  discuss any questions you have with your health care provider. Document Released: 04/14/2009 Document Revised: 11/24/2015 Document Reviewed: 07/23/2014 Elsevier Interactive Patient Education  2017 [employer].

## 2022-11-14 IMAGING — MR MR LUMBAR SPINE W/O CM
4 of 5 series · 26 of 48 positions shown · non-contrast
Comparison: Report from lumbar MRI 12/16/2006

CLINICAL DATA: Low back pain.  Right hip pain.

EXAM:
MRI LUMBAR SPINE WITHOUT CONTRAST
TECHNIQUE: Multiplanar, multisequence MR imaging of the lumbar spine was
performed. No intravenous contrast was administered.

[Series 5: T2 · sagittal · 4.0mm · 0.73mm/px · 7 of 16 slices shown (1 of 2)]
[im 1/16]
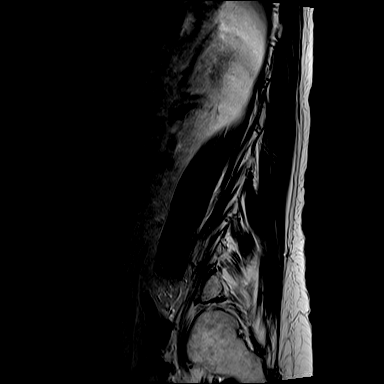
[im 3/16]
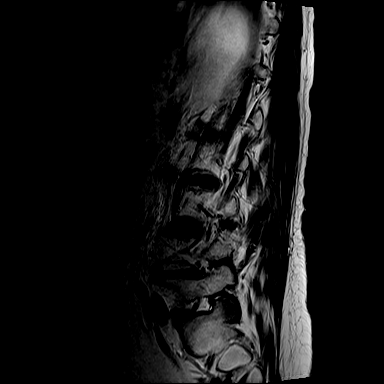
[im 6/16]
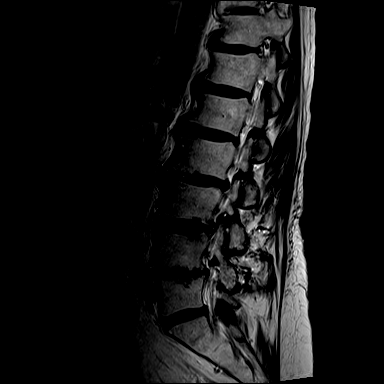
[im 8/16]
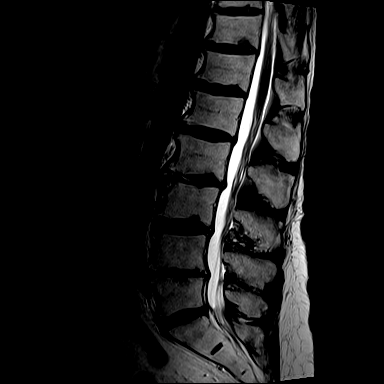
[im 11/16]
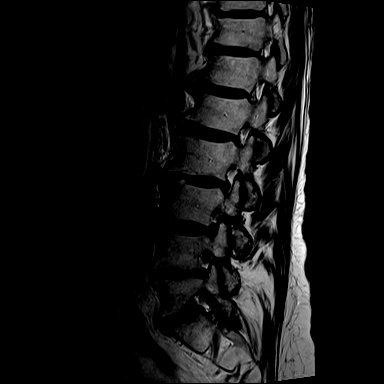
[im 13/16]
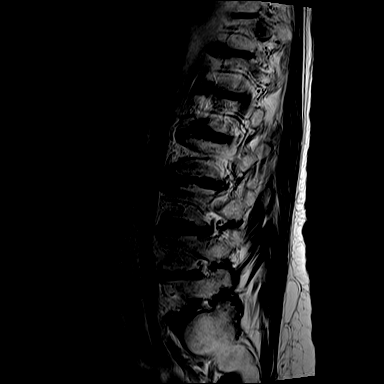
[im 16/16]
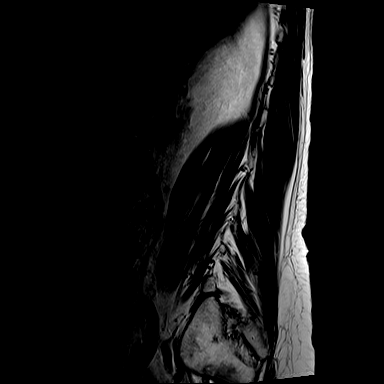

[Series 7: T1 · sagittal · 4.0mm · 0.88mm/px · 6 of 16 slices shown (1 of 2)]
[im 1/16]
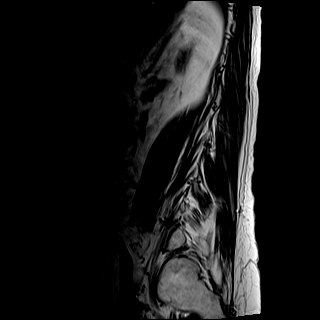
[im 4/16]
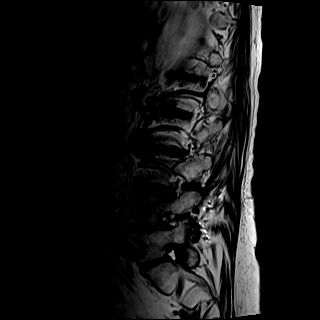
[im 7/16]
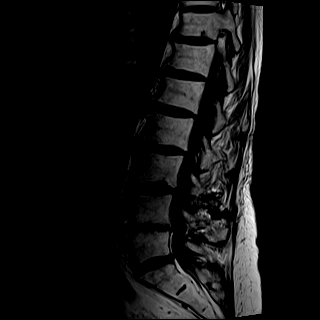
[im 10/16]
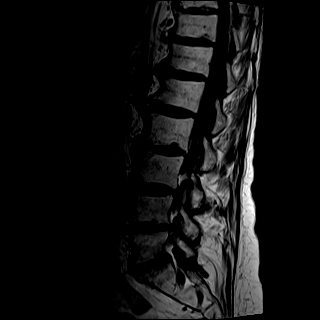
[im 13/16]
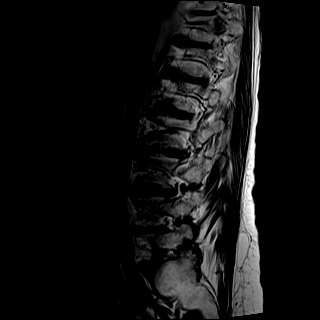
[im 16/16]
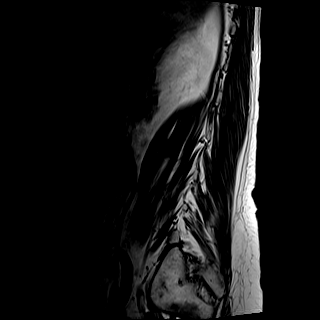

[Series 8: T2 · axial · 4.0mm · 0.57mm/px · z∈[-105,+96]mm · 8 of 36 slices shown (2 of 2)]
[im 1/36]
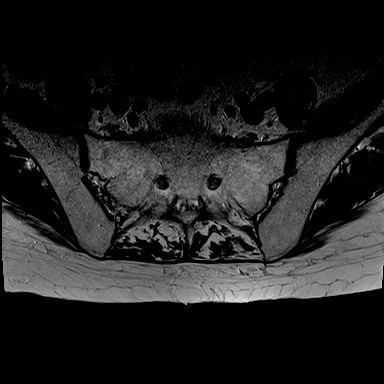
[im 6/36]
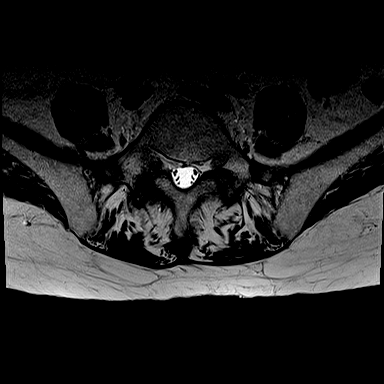
[im 11/36]
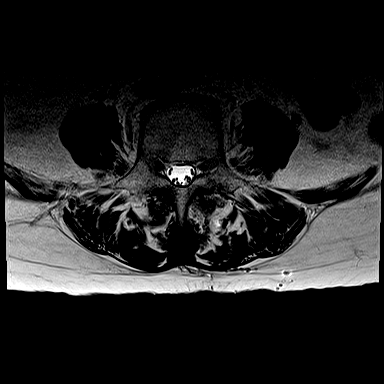
[im 17/36]
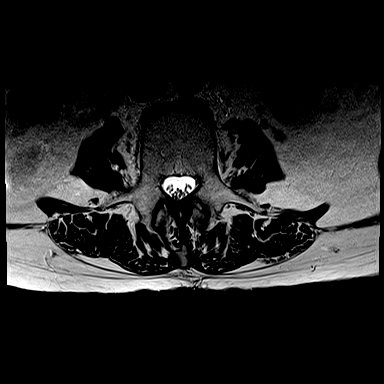
[im 19/36]
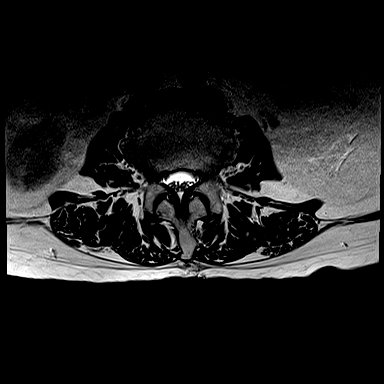
[im 25/36]
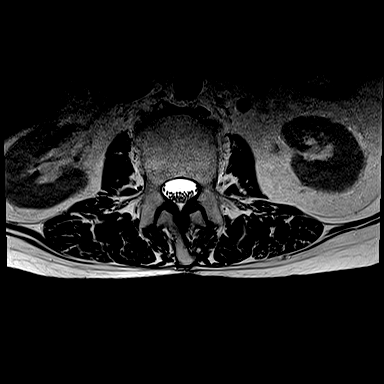
[im 30/36]
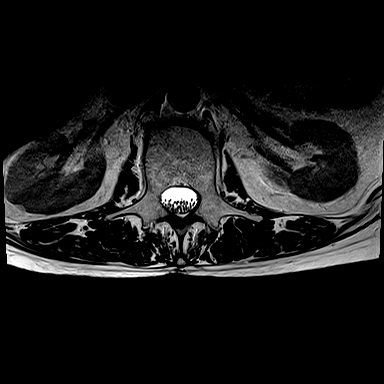
[im 36/36]
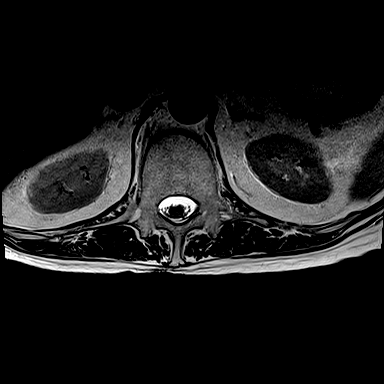

[Series 9: T1 · axial · 4.0mm · 0.34mm/px · z∈[-105,+66]mm · 5 of 36 slices shown (2 of 2)]
[im 1/36]
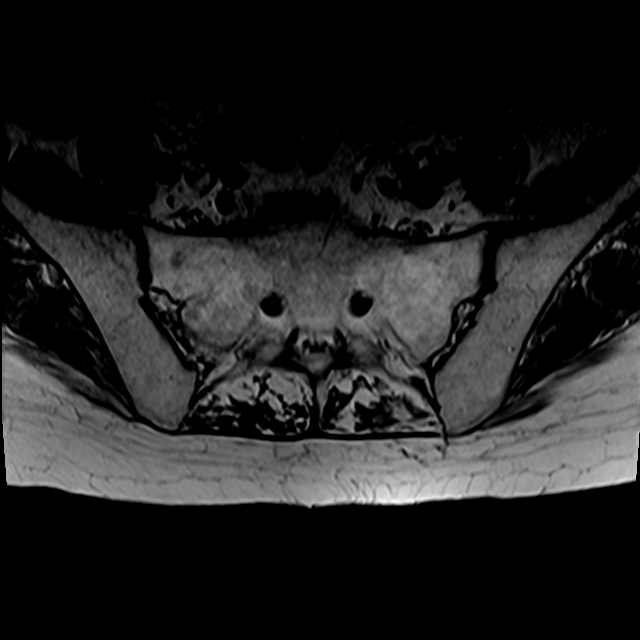
[im 6/36]
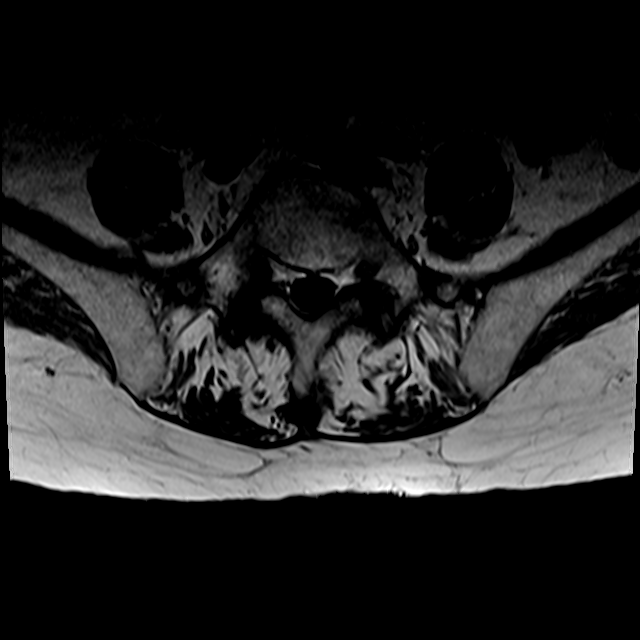
[im 11/36]
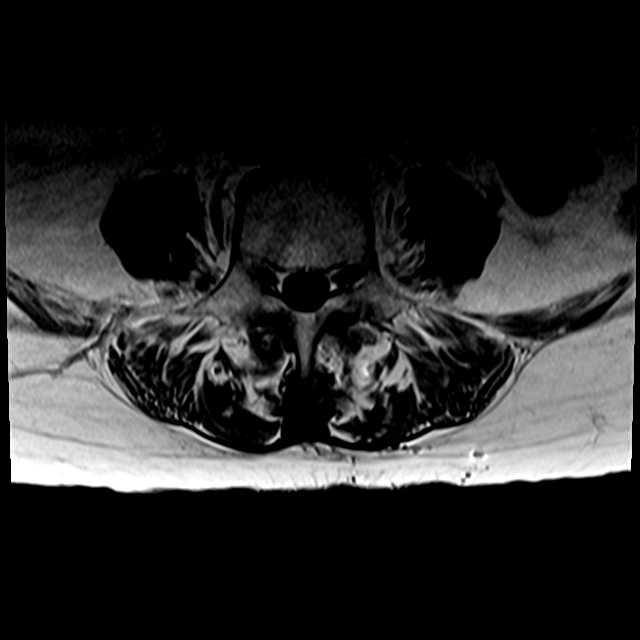
[im 19/36]
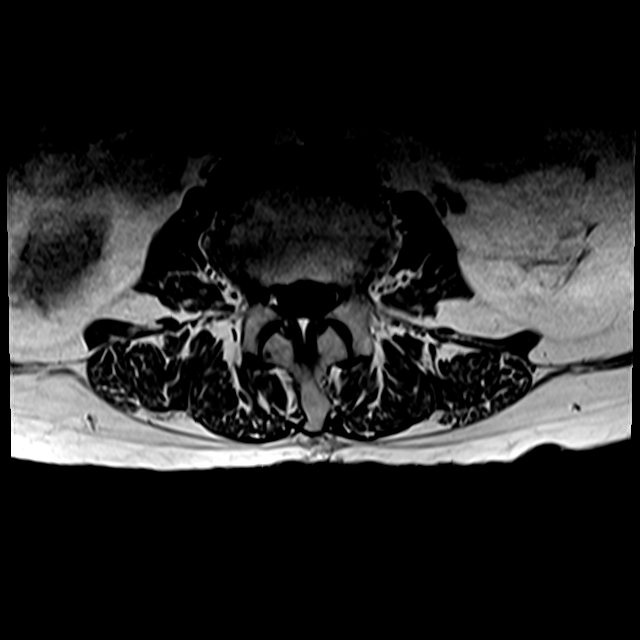
[im 30/36]
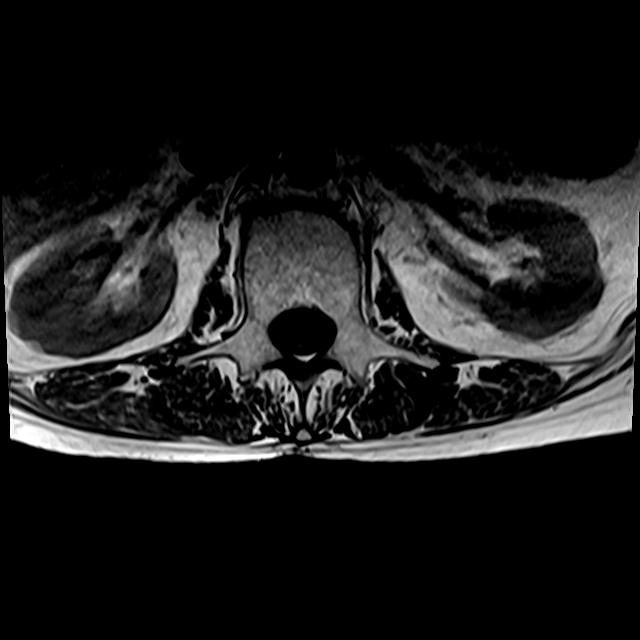

[26 of 48 positions shown; findings below may reference images not displayed]

FINDINGS: Segmentation:  5 lumbar type vertebrae

Alignment:  Negative for listhesis.

Vertebrae:  No fracture, evidence of discitis, or bone lesion.

Conus medullaris and cauda equina: Conus extends to the L1 level.
Conus and cauda equina appear normal.

Paraspinal and other soft tissues: No perispinal mass or
inflammation seen.

Disc levels:

T12- L1: Ventral spondylitic spurring and mild disc space narrowing

L1-L2: Ventral spondylitic spurring and mild disc space narrowing

L2-L3: Ventral spondylitic spurring. Disc narrowing and foraminal
predominant bulging. Negative facets. No neural compression

L3-L4: Disc narrowing and foraminal predominant bulging. The left is
a foraminal annular fissure and protrusion accentuated by facet
spurring. On sagittal images left foraminal narrowing appears
moderate. Patent spinal canal

L4-L5: Disc collapse and endplate degeneration. Degenerative facet
spurring on both sides. No neural compression.

L5-S1:Disc narrowing and bulging with a suspected right foraminal
extrusion (rounded slightly different signal structure is seen
adjacent to the exiting L5 nerve root on both sagittal and axial T2
weighted images). Degenerative facet spurring. Patent spinal canal.
IMPRESSION: 1. L5-S1 suspected right foraminal extrusion impinging on the right
L5 nerve root, query right L5 radiculitis.
2. L3-4 moderate left foraminal stenosis.
3. Noncompressive degenerative changes at the other levels are
described above.

## 2022-11-16 ENCOUNTER — Other Ambulatory Visit: Payer: Self-pay | Admitting: Family Medicine

## 2022-11-16 ENCOUNTER — Telehealth: Payer: Self-pay | Admitting: Family Medicine

## 2022-11-16 DIAGNOSIS — E1159 Type 2 diabetes mellitus with other circulatory complications: Secondary | ICD-10-CM

## 2022-11-16 MED ORDER — LISINOPRIL 10 MG PO TABS: 10.0000 mg | ORAL_TABLET | Freq: Every day | ORAL | 3 refills | Status: DC

## 2022-11-16 NOTE — Telephone Encounter (Signed)
Contacted patient. Notified patient. Patient verbalized understanding 

## 2022-11-16 NOTE — Telephone Encounter (Signed)
Please inform pt I have increased her Lisinopril to 10mg  daily. Please make sure she has a visit with nurse for repeat BP in 2 weeks

## 2022-11-16 NOTE — Telephone Encounter (Signed)
Pt called stating that her blood pressure was up yesterday and she didn't feel well. Says she started feeling worse in the night and when she checked her BP it was 174/97 so she called 911. Says when EMS got there, they checked her BP and it was 153/90. Was advised to call PCP office and let PCP know that her BP medicine may need to be increased.   Please advise.

## 2022-11-19 ENCOUNTER — Other Ambulatory Visit: Payer: Medicare Other

## 2022-11-19 DIAGNOSIS — R946 Abnormal results of thyroid function studies: Secondary | ICD-10-CM | POA: Diagnosis not present

## 2022-11-19 DIAGNOSIS — E1169 Type 2 diabetes mellitus with other specified complication: Secondary | ICD-10-CM

## 2022-11-19 DIAGNOSIS — E034 Atrophy of thyroid (acquired): Secondary | ICD-10-CM | POA: Diagnosis not present

## 2022-11-19 DIAGNOSIS — E785 Hyperlipidemia, unspecified: Secondary | ICD-10-CM | POA: Diagnosis not present

## 2022-11-19 LAB — BAYER DCA HB A1C WAIVED: HB A1C (BAYER DCA - WAIVED): 6.8 % — ABNORMAL HIGH (ref 4.8–5.6)

## 2022-11-20 LAB — LIPID PANEL
Chol/HDL Ratio: 5.6 ratio — ABNORMAL HIGH (ref 0.0–4.4)
Cholesterol, Total: 179 mg/dL (ref 100–199)
HDL: 32 mg/dL — ABNORMAL LOW (ref >39)
LDL Chol Calc (NIH): 97 mg/dL (ref 0–99)
Triglycerides: 297 mg/dL — ABNORMAL HIGH (ref 0–149)
VLDL Cholesterol Cal: 50 mg/dL — ABNORMAL HIGH (ref 5–40)

## 2022-11-20 LAB — BMP8+EGFR
BUN/Creatinine Ratio: 20 (ref 12–28)
BUN: 22 mg/dL (ref 8–27)
CO2: 23 mmol/L (ref 20–29)
Calcium: 10.1 mg/dL (ref 8.7–10.3)
Chloride: 100 mmol/L (ref 96–106)
Creatinine, Ser: 1.1 mg/dL — ABNORMAL HIGH (ref 0.57–1.00)
Glucose: 159 mg/dL — ABNORMAL HIGH (ref 70–99)
Potassium: 4.4 mmol/L (ref 3.5–5.2)
Sodium: 138 mmol/L (ref 134–144)
eGFR: 50 mL/min/{1.73_m2} — ABNORMAL LOW (ref >59)

## 2022-11-20 LAB — CBC WITH DIFFERENTIAL/PLATELET
Basophils Absolute: 0.1 10*3/uL (ref 0.0–0.2)
Basos: 2 %
EOS (ABSOLUTE): 0.1 10*3/uL (ref 0.0–0.4)
Eos: 3 %
Hematocrit: 46.8 % — ABNORMAL HIGH (ref 34.0–46.6)
Hemoglobin: 15.3 g/dL (ref 11.1–15.9)
Immature Grans (Abs): 0 10*3/uL (ref 0.0–0.1)
Immature Granulocytes: 0 %
Lymphocytes Absolute: 1.8 10*3/uL (ref 0.7–3.1)
Lymphs: 34 %
MCH: 30.8 pg (ref 26.6–33.0)
MCHC: 32.7 g/dL (ref 31.5–35.7)
MCV: 94 fL (ref 79–97)
Monocytes Absolute: 0.5 10*3/uL (ref 0.1–0.9)
Monocytes: 9 %
Neutrophils Absolute: 2.8 10*3/uL (ref 1.4–7.0)
Neutrophils: 52 %
Platelets: 229 10*3/uL (ref 150–450)
RBC: 4.97 x10E6/uL (ref 3.77–5.28)
RDW: 13.7 % (ref 11.7–15.4)
WBC: 5.3 10*3/uL (ref 3.4–10.8)

## 2022-11-20 LAB — TSH: TSH: 6.42 u[IU]/mL — ABNORMAL HIGH (ref 0.450–4.500)

## 2022-11-21 ENCOUNTER — Ambulatory Visit (INDEPENDENT_AMBULATORY_CARE_PROVIDER_SITE_OTHER): Payer: Medicare Other | Admitting: Family Medicine

## 2022-11-21 ENCOUNTER — Encounter: Payer: Self-pay | Admitting: Family Medicine

## 2022-11-21 ENCOUNTER — Other Ambulatory Visit: Payer: Self-pay | Admitting: Family Medicine

## 2022-11-21 VITALS — BP 146/81 | HR 83 | Temp 98.7°F | Ht 64.0 in | Wt 157.0 lb

## 2022-11-21 DIAGNOSIS — E034 Atrophy of thyroid (acquired): Secondary | ICD-10-CM

## 2022-11-21 DIAGNOSIS — B351 Tinea unguium: Secondary | ICD-10-CM

## 2022-11-21 DIAGNOSIS — M609 Myositis, unspecified: Secondary | ICD-10-CM

## 2022-11-21 DIAGNOSIS — E1169 Type 2 diabetes mellitus with other specified complication: Secondary | ICD-10-CM

## 2022-11-21 DIAGNOSIS — I480 Paroxysmal atrial fibrillation: Secondary | ICD-10-CM | POA: Diagnosis not present

## 2022-11-21 DIAGNOSIS — Z23 Encounter for immunization: Secondary | ICD-10-CM

## 2022-11-21 DIAGNOSIS — N1831 Chronic kidney disease, stage 3a: Secondary | ICD-10-CM

## 2022-11-21 DIAGNOSIS — E785 Hyperlipidemia, unspecified: Secondary | ICD-10-CM | POA: Diagnosis not present

## 2022-11-21 DIAGNOSIS — E1122 Type 2 diabetes mellitus with diabetic chronic kidney disease: Secondary | ICD-10-CM | POA: Diagnosis not present

## 2022-11-21 DIAGNOSIS — E1159 Type 2 diabetes mellitus with other circulatory complications: Secondary | ICD-10-CM | POA: Diagnosis not present

## 2022-11-21 DIAGNOSIS — I272 Pulmonary hypertension, unspecified: Secondary | ICD-10-CM

## 2022-11-21 DIAGNOSIS — Z7984 Long term (current) use of oral hypoglycemic drugs: Secondary | ICD-10-CM

## 2022-11-21 DIAGNOSIS — I152 Hypertension secondary to endocrine disorders: Secondary | ICD-10-CM | POA: Diagnosis not present

## 2022-11-21 DIAGNOSIS — I495 Sick sinus syndrome: Secondary | ICD-10-CM

## 2022-11-21 LAB — SPECIMEN STATUS REPORT

## 2022-11-21 MED ORDER — JUBLIA 10 % EX SOLN
CUTANEOUS | 3 refills | Status: DC
Start: 2022-11-21 — End: 2024-04-03

## 2022-11-21 MED ORDER — NEXLETOL 180 MG PO TABS
180.0000 mg | ORAL_TABLET | Freq: Every day | ORAL | 3 refills | Status: DC
Start: 2022-11-21 — End: 2024-01-08

## 2022-11-21 NOTE — Progress Notes (Signed)
Subjective: CC:DM PCP: Raliegh Ip, DO Meredith Anderson is a 82 y.o. female presenting to clinic today for:  1. Type 2 Diabetes with hypertension, hyperlipidemia w/ CKD3a:  Patient reports compliance with medications but she admits that she cannot tolerate Zetia.  It causes knots in her thighs which can be uncomfortable.  She occasionally does have some vaginal irritation with the Marcelline Deist but this is always relieved by OTC topical Monistat.  She uses this less than 1 time per week and really only if she is having vaginal irritation.  No dysuria, hematuria.  Last eye exam: UTD Last foot exam: UTD Last A1c:  Lab Results  Component Value Date   HGBA1C 6.8 (H) 11/19/2022   Nephropathy screen indicated?: UTD Last flu, zoster and/or pneumovax:  Immunization History  Administered Date(s) Administered   Fluad Quad(high Dose 65+) 03/25/2019, 04/27/2020, 05/22/2022   Influenza Whole 04/01/2012   Influenza, High Dose Seasonal PF 04/17/2017, 04/16/2018   Influenza, Seasonal, Injecte, Preservative Fre 03/28/2010, 05/29/2011, 04/03/2012   Influenza,inj,Quad PF,6+ Mos 04/03/2013, 04/03/2016   Influenza-Unspecified 03/15/2014   Moderna Sars-Covid-2 Vaccination 07/21/2019, 08/18/2019, 06/16/2020   Pneumococcal Conjugate-13 05/17/2014   Pneumococcal Polysaccharide-23 08/02/2009   Tdap 01/31/2012, 02/21/2012   Zoster Recombinat (Shingrix) 02/28/2021   Zoster, Live 01/31/2012, 02/21/2012    ROS: Reports 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  2.  Hypothyroidism Compliant with Synthroid.  No reports of tremor, heart palpitations or changes in bowel habits.  3.  Atrial fibrillation Compliant with medications.  No heart palpitations.  No hematochezia, vaginal bleeding or epistaxis.  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    Cataract    Depression 12/12/2015   Dyshydrosis    Hemorrhoids    Hyperlipidemia    Hypertension    Menopause    NIDDM (non-insulin dependent diabetes mellitus)    diet controlled    Osteopenia 2016   Syncope 11/08/2015   Thyroid disease     Current Outpatient Medications:    acetaminophen (TYLENOL) 500 MG tablet, Take by mouth., Disp: , Rfl:    azelastine (ASTELIN) 0.1 % nasal spray, PLACE 1 SPRAY INTO BOTH NOSTRILS 2 (TWO) TIMES DAILY., Disp: 30 mL, Rfl: 4   cholecalciferol (VITAMIN D) 1000 UNITS tablet, Take 1,000 Units by mouth daily., Disp: , Rfl:    dapagliflozin propanediol (FARXIGA) 10 MG TABS tablet, Take 1 tablet (10 mg total) by mouth daily before breakfast. To REPLACE jardiance, Disp: 90 tablet, Rfl: 3   diclofenac Sodium (VOLTAREN) 1 % GEL, APPLY 2 GRAMS TOPICALLY 4 (FOUR) TIMES DAILY. (AS NEEDED FOR JOINT PAIN), Disp: 400 g, Rfl: 3   diltiazem (CARDIZEM CD) 120 MG 24 hr capsule, TAKE 1 CAPSULE BY MOUTH EVERY DAY, Disp: 90 capsule, Rfl: 0  doxycycline (VIBRA-TABS) 100 MG tablet, Take 2 tablet by mouth once. (Patient not taking: Reported on 11/07/2022), Disp: 2 tablet, Rfl: 0   ezetimibe (ZETIA) 10 MG tablet, Take 1 tablet (10 mg total) by mouth daily., Disp: 90 tablet, Rfl: 3   furosemide (LASIX) 40 MG tablet, Take 1 tablet (40 mg total) by mouth daily as needed for fluid or edema., Disp: 90 tablet, Rfl: 3   gabapentin (NEURONTIN) 100 MG capsule, TAKE 2 CAPSULES BY MOUTH 3 TIMES A DAY, Disp: 180 capsule, Rfl: 2   glucose blood (ACCU-CHEK AVIVA  PLUS) test strip, CHECK BLOOD SUGAR ONCE A DAY  Dx E11.9, Disp: 100 strip, Rfl: 3   hydrocortisone (ANUSOL-HC) 2.5 % rectal cream, PLACE 1 APPLICATION RECTALLY AS NEEDED FOR HEMORRHOID FLARES- MAY USE 7 CONSECUTIVE DAYS, Disp: 30 g, Rfl: 1   JANUVIA 100 MG tablet, TAKE 1 TABLET BY MOUTH EVERY DAY, Disp: 90 tablet, Rfl: 0   levothyroxine (SYNTHROID) 125 MCG tablet, TAKE 1 TABLET BY MOUTH DAILY BEFORE BREAKFAST., Disp: 90 tablet, Rfl: 2   lidocaine (LIDODERM) 5 %, Place 1 patch onto the skin daily. Remove & Discard patch within 12 hours or as directed by MD, Disp: 30 patch, Rfl: 3   lidocaine (XYLOCAINE) 2 % jelly, Apply 1 application topically 3 (three) times daily. Apply to hemorrhoid when painful, Disp: 30 mL, Rfl: 1   lisinopril (ZESTRIL) 10 MG tablet, Take 1 tablet (10 mg total) by mouth daily., Disp: 100 tablet, Rfl: 3   magnesium oxide (MAG-OX) 400 MG tablet, 400 mg., Disp: , Rfl:    meclizine (ANTIVERT) 50 MG tablet, Take 1 tablet (50 mg total) by mouth 3 (three) times daily as needed., Disp: 60 tablet, Rfl: 0   pioglitazone (ACTOS) 15 MG tablet, Take 1 tablet (15 mg total) by mouth daily. For sugar.  To REPLACE metformin, Disp: 90 tablet, Rfl: 0   Potassium Chloride ER 20 MEQ TBCR, TAKE 1 TABLET BY MOUTH EVERY DAY, Disp: 90 tablet, Rfl: 0   PREMARIN vaginal cream, USING APPLICATOR PLACE 1 GRAM IN VAGINA EVERY OTHER NIGHT AS INSTRUCTED, Disp: 30 g, Rfl: 3   TETRAHYDROZOLINE HCL OP, Apply to eye. Reported on 01/02/2016, Disp: , Rfl:    XARELTO 15 MG TABS tablet, TAKE 1 TABLET (15 MG TOTAL) BY MOUTH DAILY WITH SUPPER, Disp: 90 tablet, Rfl: 0 Social History   Socioeconomic History   Marital status: Divorced    Spouse name: Not on file   Number of children: 2   Years of education: 12   Highest education level: High school graduate  Occupational History   Occupation: Retired  Tobacco Use   Smoking status: Never   Smokeless tobacco: Never  Building services engineer Use: Never used  Substance and  Sexual Activity   Alcohol use: No   Drug use: No   Sexual activity: Not Currently  Other Topics Concern   Not on file  Social History Narrative   Lives alone. Children live nearby   Social Determinants of Health   Financial Resource Strain: Low Risk  (11/07/2022)   Overall Financial Resource Strain (CARDIA)    Difficulty of Paying Living Expenses: Not hard at all  Food Insecurity: No Food Insecurity (11/07/2022)   Hunger Vital Sign    Worried About Running Out of Food in the Last Year: Never true    Ran Out of Food in the Last Year: Never true  Transportation Needs: No Transportation Needs (11/07/2022)   PRAPARE - Transportation  Lack of Transportation (Medical): No    Lack of Transportation (Non-Medical): No  Physical Activity: Inactive (11/07/2022)   Exercise Vital Sign    Days of Exercise per Week: 0 days    Minutes of Exercise per Session: 0 min  Stress: No Stress Concern Present (11/07/2022)   [employer] of Occupational Health - Occupational Stress Questionnaire    Feeling of Stress : Not at all  Social Connections: Moderately Isolated (11/07/2022)   Social Connection and Isolation Panel [NHANES]    Frequency of Communication with Friends and Family: More than three times a week    Frequency of Social Gatherings with Friends and Family: More than three times a week    Attends Religious Services: Never    Database administrator or Organizations: Yes    Attends Banker Meetings: 1 to 4 times per year    Marital Status: Divorced  Intimate Partner Violence: Not At Risk (11/07/2022)   Humiliation, Afraid, Rape, and Kick questionnaire    Fear of Current or Ex-Partner: No    Emotionally Abused: No    Physically Abused: No    Sexually Abused: No   Family History  Problem Relation Age of Onset   Stroke Mother    Diabetes Mother    Heart disease Father    Atrial fibrillation Father    Cancer Sister        ovarian / colon   Diabetes Sister    Heart disease  Brother    Hyperlipidemia Brother    Hypertension Brother    Diabetes Brother    Heart attack Brother 66       had to perform emergency surgery   Dementia Brother    Hyperlipidemia Sister    Cancer Sister        liver   Hyperlipidemia Sister    Diabetes Sister        Boarderline DM   Cancer Sister    Diabetes Sister    Diabetes Daughter    Rheum arthritis Daughter    Psoriasis Daughter     Objective: Office vital signs reviewed. BP (!) 146/81   Pulse 83   Temp 98.7 F (37.1 C)   Ht 5\' 4"  (1.626 m)   Wt 157 lb (71.2 kg)   SpO2 97%   BMI 26.95 kg/m   Physical Examination:  General: Awake, alert, well nourished, No acute distress HEENT: No exophthalmos.  No goiter Cardio: regular rate and rhythm, S1S2 heard, no murmurs appreciated Pulm: clear to auscultation bilaterally, no wheezes, rhonchi or rales; normal work of breathing on room air Neuro: No tremor  Nails: Mild onychomycotic changes noted Results for orders placed or performed in visit on 11/19/22 (from the past 72 hour(s))  Bayer DCA Hb A1c Waived     Status: Abnormal   Collection Time: 11/19/22 10:47 AM  Result Value Ref Range   HB A1C (BAYER DCA - WAIVED) 6.8 (H) 4.8 - 5.6 %    Comment:          Prediabetes: 5.7 - 6.4          Diabetes: >6.4          Glycemic control for adults with diabetes: <7.0   TSH     Status: Abnormal   Collection Time: 11/19/22 10:53 AM  Result Value Ref Range   TSH 6.420 (H) 0.450 - 4.500 uIU/mL  BMP8+EGFR     Status: Abnormal   Collection Time: 11/19/22 10:53 AM  Result Value Ref  Range   Glucose 159 (H) 70 - 99 mg/dL   BUN 22 8 - 27 mg/dL   Creatinine, Ser 1.19 (H) 0.57 - 1.00 mg/dL   eGFR 50 (L) >14 NW/GNF/6.21   BUN/Creatinine Ratio 20 12 - 28   Sodium 138 134 - 144 mmol/L   Potassium 4.4 3.5 - 5.2 mmol/L   Chloride 100 96 - 106 mmol/L   CO2 23 20 - 29 mmol/L   Calcium 10.1 8.7 - 10.3 mg/dL  Lipid panel     Status: Abnormal   Collection Time: 11/19/22 10:53 AM  Result  Value Ref Range   Cholesterol, Total 179 100 - 199 mg/dL   Triglycerides 308 (H) 0 - 149 mg/dL   HDL 32 (L) >65 mg/dL   VLDL Cholesterol Cal 50 (H) 5 - 40 mg/dL   LDL Chol Calc (NIH) 97 0 - 99 mg/dL   Chol/HDL Ratio 5.6 (H) 0.0 - 4.4 ratio    Comment:                                   T. Chol/HDL Ratio                                             Men  Women                               1/2 Avg.Risk  3.4    3.3                                   Avg.Risk  5.0    4.4                                2X Avg.Risk  9.6    7.1                                3X Avg.Risk 23.4   11.0   CBC with Differential/Platelet     Status: Abnormal   Collection Time: 11/19/22 10:53 AM  Result Value Ref Range   WBC 5.3 3.4 - 10.8 x10E3/uL   RBC 4.97 3.77 - 5.28 x10E6/uL   Hemoglobin 15.3 11.1 - 15.9 g/dL   Hematocrit 78.4 (H) 69.6 - 46.6 %   MCV 94 79 - 97 fL   MCH 30.8 26.6 - 33.0 pg   MCHC 32.7 31.5 - 35.7 g/dL   RDW 29.5 28.4 - 13.2 %   Platelets 229 150 - 450 x10E3/uL   Neutrophils 52 Not Estab. %   Lymphs 34 Not Estab. %   Monocytes 9 Not Estab. %   Eos 3 Not Estab. %   Basos 2 Not Estab. %   Neutrophils Absolute 2.8 1.4 - 7.0 x10E3/uL   Lymphocytes Absolute 1.8 0.7 - 3.1 x10E3/uL   Monocytes Absolute 0.5 0.1 - 0.9 x10E3/uL   EOS (ABSOLUTE) 0.1 0.0 - 0.4 x10E3/uL   Basophils Absolute 0.1 0.0 - 0.2 x10E3/uL   Immature Granulocytes 0 Not Estab. %   Immature Grans (Abs) 0.0 0.0 - 0.1 x10E3/uL  T4, free     Status: None (Preliminary result)   Collection Time: 11/19/22 10:53 AM  Result Value Ref Range   Free T4 WILL FOLLOW   Specimen status report     Status: None   Collection Time: 11/19/22 10:53 AM  Result Value Ref Range   specimen status report Comment     Comment: Written Authorization Written Authorization Written Authorization Received. Authorization received from [employer] 11-21-2022 Logged by Adria Dill     Assessment/ Plan: 82 y.o. female   Type 2 diabetes mellitus with  stage 3a chronic kidney disease, without long-term current use of insulin (HCC)  Hypertension associated with diabetes (HCC)  Hyperlipidemia associated with type 2 diabetes mellitus (HCC) - Plan: Bempedoic Acid (NEXLETOL) 180 MG TABS  Statin-induced myositis - Plan: Bempedoic Acid (NEXLETOL) 180 MG TABS  Paroxysmal atrial fibrillation (HCC) - Plan: Bempedoic Acid (NEXLETOL) 180 MG TABS  Onychomycosis of toenail - Plan: Efinaconazole (JUBLIA) 10 % SOLN  Hypothyroidism due to acquired atrophy of thyroid  Sugar under excellent control.  Unfortunately still does not have great control of cholesterol despite trial of multiple statins and Zetia.  I have given her 4-week sample of Nexletol to trial and sent this to pharmacy as well.  Also need to consider adding maybe a fenofibrate or Vascepa to help with triglycerides.  Will see how she responds first Nexletol but this may be something we need to consider adding.  Blood pressure is controlled for age.  No changes  Seems to be both rate and rhythm controlled on exam today.  Continue anticoagulant.  Having no issues with affordability at this time  Jublia prescribed for onychomycosis.  Use as directed  Reviewed lab results.  TSH elevated.  Free T4 added.  Further adjustment to medications pending this result  No orders of the defined types were placed in this encounter.  No orders of the defined types were placed in this encounter.    Raliegh Ip, DO [facility] [phone]

## 2022-11-21 NOTE — Patient Instructions (Addendum)
Take 1/2 tablet of the Lisinopril daily If the blood pressure is above 150/90, take the other half STOP ezetimibe.  START Nexletol.  Take 1 tablet daily. Make lab appointment for cholesterol to be done in 3 months

## 2022-11-22 ENCOUNTER — Other Ambulatory Visit: Payer: Self-pay | Admitting: Family Medicine

## 2022-11-22 DIAGNOSIS — M609 Myositis, unspecified: Secondary | ICD-10-CM

## 2022-11-22 DIAGNOSIS — I480 Paroxysmal atrial fibrillation: Secondary | ICD-10-CM

## 2022-11-22 DIAGNOSIS — E1169 Type 2 diabetes mellitus with other specified complication: Secondary | ICD-10-CM

## 2022-11-22 LAB — T4, FREE: Free T4: 1.48 ng/dL (ref 0.82–1.77)

## 2022-11-23 ENCOUNTER — Ambulatory Visit: Payer: Medicare Other | Admitting: Family Medicine

## 2022-11-26 ENCOUNTER — Other Ambulatory Visit: Payer: Self-pay | Admitting: Family Medicine

## 2022-11-26 DIAGNOSIS — I10 Essential (primary) hypertension: Secondary | ICD-10-CM

## 2022-12-10 ENCOUNTER — Other Ambulatory Visit: Payer: Self-pay | Admitting: Family Medicine

## 2022-12-10 DIAGNOSIS — E1169 Type 2 diabetes mellitus with other specified complication: Secondary | ICD-10-CM

## 2022-12-10 DIAGNOSIS — E1159 Type 2 diabetes mellitus with other circulatory complications: Secondary | ICD-10-CM

## 2022-12-10 DIAGNOSIS — E1122 Type 2 diabetes mellitus with diabetic chronic kidney disease: Secondary | ICD-10-CM

## 2022-12-11 ENCOUNTER — Other Ambulatory Visit: Payer: Self-pay | Admitting: *Deleted

## 2022-12-11 ENCOUNTER — Telehealth: Payer: Self-pay | Admitting: Family Medicine

## 2022-12-11 DIAGNOSIS — J019 Acute sinusitis, unspecified: Secondary | ICD-10-CM

## 2022-12-11 NOTE — Telephone Encounter (Signed)
Does she mean that the Nexletol is ALSO causing joint pain?  I'm not sure what she means but "hasn't helped" it's a cholesterol med.

## 2022-12-11 NOTE — Telephone Encounter (Signed)
Attempted to call pt 2x keeps giving a busy tone

## 2022-12-12 ENCOUNTER — Other Ambulatory Visit: Payer: Self-pay | Admitting: Family Medicine

## 2022-12-12 DIAGNOSIS — E1122 Type 2 diabetes mellitus with diabetic chronic kidney disease: Secondary | ICD-10-CM

## 2022-12-12 NOTE — Telephone Encounter (Signed)
Message from patient with issues of possible side effects from med. Please let us know if med will be changed or if PA will still be needed.

## 2022-12-13 ENCOUNTER — Other Ambulatory Visit: Payer: Self-pay | Admitting: *Deleted

## 2022-12-13 DIAGNOSIS — J019 Acute sinusitis, unspecified: Secondary | ICD-10-CM

## 2022-12-19 LAB — HM DIABETES EYE EXAM

## 2022-12-21 DIAGNOSIS — I495 Sick sinus syndrome: Secondary | ICD-10-CM | POA: Diagnosis not present

## 2022-12-27 DIAGNOSIS — Z45018 Encounter for adjustment and management of other part of cardiac pacemaker: Secondary | ICD-10-CM | POA: Diagnosis not present

## 2023-01-01 DIAGNOSIS — H5213 Myopia, bilateral: Secondary | ICD-10-CM | POA: Diagnosis not present

## 2023-01-31 ENCOUNTER — Other Ambulatory Visit: Payer: Self-pay | Admitting: Family Medicine

## 2023-02-21 ENCOUNTER — Other Ambulatory Visit: Payer: Medicare Other

## 2023-02-21 DIAGNOSIS — N1831 Chronic kidney disease, stage 3a: Secondary | ICD-10-CM

## 2023-02-21 DIAGNOSIS — E1122 Type 2 diabetes mellitus with diabetic chronic kidney disease: Secondary | ICD-10-CM | POA: Diagnosis not present

## 2023-02-21 LAB — BAYER DCA HB A1C WAIVED: HB A1C (BAYER DCA - WAIVED): 7 % — ABNORMAL HIGH (ref 4.8–5.6)

## 2023-02-22 LAB — CBC WITH DIFFERENTIAL/PLATELET
Basophils Absolute: 0.1 10*3/uL (ref 0.0–0.2)
Basos: 1 %
EOS (ABSOLUTE): 0.1 10*3/uL (ref 0.0–0.4)
Eos: 2 %
Hematocrit: 43 % (ref 34.0–46.6)
Hemoglobin: 14.5 g/dL (ref 11.1–15.9)
Immature Grans (Abs): 0 10*3/uL (ref 0.0–0.1)
Immature Granulocytes: 0 %
Lymphocytes Absolute: 1.6 10*3/uL (ref 0.7–3.1)
Lymphs: 33 %
MCH: 31.3 pg (ref 26.6–33.0)
MCHC: 33.7 g/dL (ref 31.5–35.7)
MCV: 93 fL (ref 79–97)
Monocytes Absolute: 0.5 10*3/uL (ref 0.1–0.9)
Monocytes: 11 %
Neutrophils Absolute: 2.5 10*3/uL (ref 1.4–7.0)
Neutrophils: 53 %
Platelets: 217 10*3/uL (ref 150–450)
RBC: 4.63 x10E6/uL (ref 3.77–5.28)
RDW: 14.3 % (ref 11.7–15.4)
WBC: 4.8 10*3/uL (ref 3.4–10.8)

## 2023-02-22 LAB — LIPID PANEL
Chol/HDL Ratio: 4.3 ratio (ref 0.0–4.4)
Cholesterol, Total: 155 mg/dL (ref 100–199)
HDL: 36 mg/dL — ABNORMAL LOW (ref >39)
LDL Chol Calc (NIH): 77 mg/dL (ref 0–99)
Triglycerides: 256 mg/dL — ABNORMAL HIGH (ref 0–149)
VLDL Cholesterol Cal: 42 mg/dL — ABNORMAL HIGH (ref 5–40)

## 2023-02-22 LAB — CMP14+EGFR
ALT: 9 IU/L (ref 0–32)
AST: 14 IU/L (ref 0–40)
Albumin: 4.9 g/dL — ABNORMAL HIGH (ref 3.7–4.7)
Alkaline Phosphatase: 111 IU/L (ref 44–121)
BUN/Creatinine Ratio: 23 (ref 12–28)
BUN: 23 mg/dL (ref 8–27)
Bilirubin Total: 0.7 mg/dL (ref 0.0–1.2)
CO2: 23 mmol/L (ref 20–29)
Calcium: 10.2 mg/dL (ref 8.7–10.3)
Chloride: 101 mmol/L (ref 96–106)
Creatinine, Ser: 1 mg/dL (ref 0.57–1.00)
Globulin, Total: 2.9 g/dL (ref 1.5–4.5)
Glucose: 149 mg/dL — ABNORMAL HIGH (ref 70–99)
Potassium: 4.6 mmol/L (ref 3.5–5.2)
Sodium: 142 mmol/L (ref 134–144)
Total Protein: 7.8 g/dL (ref 6.0–8.5)
eGFR: 57 mL/min/{1.73_m2} — ABNORMAL LOW (ref >59)

## 2023-02-24 ENCOUNTER — Other Ambulatory Visit: Payer: Self-pay | Admitting: Family Medicine

## 2023-02-24 DIAGNOSIS — E1159 Type 2 diabetes mellitus with other circulatory complications: Secondary | ICD-10-CM

## 2023-02-26 DIAGNOSIS — R42 Dizziness and giddiness: Secondary | ICD-10-CM | POA: Diagnosis not present

## 2023-02-26 DIAGNOSIS — E119 Type 2 diabetes mellitus without complications: Secondary | ICD-10-CM | POA: Diagnosis not present

## 2023-02-26 DIAGNOSIS — R7989 Other specified abnormal findings of blood chemistry: Secondary | ICD-10-CM | POA: Diagnosis not present

## 2023-02-26 DIAGNOSIS — R918 Other nonspecific abnormal finding of lung field: Secondary | ICD-10-CM | POA: Diagnosis not present

## 2023-02-26 DIAGNOSIS — J9811 Atelectasis: Secondary | ICD-10-CM | POA: Diagnosis not present

## 2023-02-26 DIAGNOSIS — Z950 Presence of cardiac pacemaker: Secondary | ICD-10-CM | POA: Diagnosis not present

## 2023-02-26 DIAGNOSIS — I10 Essential (primary) hypertension: Secondary | ICD-10-CM | POA: Diagnosis not present

## 2023-03-07 DIAGNOSIS — I4891 Unspecified atrial fibrillation: Secondary | ICD-10-CM | POA: Diagnosis not present

## 2023-03-13 ENCOUNTER — Ambulatory Visit (INDEPENDENT_AMBULATORY_CARE_PROVIDER_SITE_OTHER): Payer: Medicare Other | Admitting: Family Medicine

## 2023-03-13 ENCOUNTER — Encounter: Payer: Self-pay | Admitting: Family Medicine

## 2023-03-13 VITALS — BP 164/89 | HR 76 | Temp 98.6°F | Ht 64.0 in | Wt 152.8 lb

## 2023-03-13 DIAGNOSIS — R42 Dizziness and giddiness: Secondary | ICD-10-CM | POA: Diagnosis not present

## 2023-03-13 DIAGNOSIS — E1159 Type 2 diabetes mellitus with other circulatory complications: Secondary | ICD-10-CM | POA: Diagnosis not present

## 2023-03-13 DIAGNOSIS — Z23 Encounter for immunization: Secondary | ICD-10-CM | POA: Diagnosis not present

## 2023-03-13 DIAGNOSIS — I152 Hypertension secondary to endocrine disorders: Secondary | ICD-10-CM

## 2023-03-13 MED ORDER — FEXOFENADINE HCL 180 MG PO TABS: 180.0000 mg | ORAL_TABLET | Freq: Every day | ORAL | 0 refills | Status: DC

## 2023-03-13 MED ORDER — TRIAMCINOLONE ACETONIDE 55 MCG/ACT NA AERO: 2.0000 | INHALATION_SPRAY | Freq: Every day | NASAL | Status: DC

## 2023-03-13 NOTE — Progress Notes (Signed)
Subjective: CC:ER follow up PCP: Raliegh Ip, DO KGM:WNUU H Woloszyk is a 82 y.o. female presenting to clinic today for:  1. Vertigo Reports ongoing intermittent dizziness with standing.  She reports no falls.  Has had some tinnitus.  Currently utilizing Astelin and as needed loratadine.  Blood pressures continue to fluctuate anywhere from normal tension at 120's systolic to high as 160s.  There is no rhyme or reason.  She is compliant with her medications but notes sometimes she only takes a quarter of the lisinopril when her blood pressure is "good".   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 For cholesterol, Disp: 90 tablet, Rfl: 3   cholecalciferol (VITAMIN D) 1000 UNITS tablet, Take 1,000 Units by mouth daily., Disp: , Rfl:    dapagliflozin propanediol (FARXIGA) 10 MG TABS tablet, Take 1 tablet (10 mg total) by  mouth daily before breakfast. To REPLACE jardiance, Disp: 90 tablet, Rfl: 3   diclofenac Sodium (VOLTAREN) 1 % GEL, APPLY 2 GRAMS TOPICALLY 4 (FOUR) TIMES DAILY. (AS NEEDED FOR JOINT PAIN), Disp: 400 g, Rfl: 3   diltiazem (CARDIZEM CD) 120 MG 24 hr capsule, TAKE 1 CAPSULE BY MOUTH EVERY DAY, Disp: 90 capsule, Rfl: 1   doxycycline (VIBRA-TABS) 100 MG tablet, Take 2 tablet by mouth once., Disp: 2 tablet, Rfl: 0   Efinaconazole (JUBLIA) 10 % SOLN, Apply to affected areas daily as directed for nail fungus x48 weeks, Disp: 8 mL, Rfl: 3   ezetimibe (ZETIA) 10 MG tablet, Take 1 tablet (10 mg total) by mouth daily., Disp: 90 tablet, Rfl: 3   furosemide (LASIX) 40 MG tablet, TAKE 1 TABLET (40 MG TOTAL) BY MOUTH DAILY AS NEEDED FOR FLUID OR EDEMA., Disp: 90 tablet, Rfl: 0   gabapentin (NEURONTIN) 100 MG capsule, TAKE 2 CAPSULES BY MOUTH 3 TIMES A DAY, Disp: 180 capsule, Rfl: 2   glucose blood (ACCU-CHEK AVIVA PLUS) test strip, CHECK BLOOD SUGAR ONCE A DAY  Dx E11.9, Disp: 100 strip, Rfl: 3   hydrocortisone (ANUSOL-HC) 2.5 % rectal cream, PLACE 1 APPLICATION RECTALLY AS NEEDED FOR HEMORRHOID FLARES- MAY USE 7 CONSECUTIVE DAYS, Disp: 30 g,  Rfl: 1   levothyroxine (SYNTHROID) 125 MCG tablet, TAKE 1 TABLET BY MOUTH DAILY BEFORE BREAKFAST., Disp: 90 tablet, Rfl: 2   lidocaine (LIDODERM) 5 %, Place 1 patch onto the skin daily. Remove & Discard patch within 12 hours or as directed by MD, Disp: 30 patch, Rfl: 3   lidocaine (XYLOCAINE) 2 % jelly, Apply 1 application topically 3 (three) times daily. Apply to hemorrhoid when painful, Disp: 30 mL, Rfl: 1   lisinopril (ZESTRIL) 10 MG tablet, Take 1 tablet (10 mg total) by mouth daily., Disp: 100 tablet, Rfl: 3   magnesium oxide (MAG-OX) 400 MG tablet, 400 mg., Disp: , Rfl:    meclizine (ANTIVERT) 50 MG tablet, Take 1 tablet (50 mg total) by mouth 3 (three) times daily as needed., Disp: 60 tablet, Rfl: 0   pioglitazone (ACTOS) 15 MG tablet, TAKE 1 TABLET (15 MG TOTAL) BY  MOUTH DAILY. FOR SUGAR. TO REPLACE METFORMIN, Disp: 90 tablet, Rfl: 0   Potassium Chloride ER 20 MEQ TBCR, TAKE 1 TABLET BY MOUTH EVERY DAY, Disp: 90 tablet, Rfl: 1   PREMARIN vaginal cream, USING APPLICATOR PLACE 1 GRAM IN VAGINA EVERY OTHER NIGHT AS INSTRUCTED, Disp: 30 g, Rfl: 3   Rivaroxaban (XARELTO) 15 MG TABS tablet, TAKE 1 TABLET (15 MG TOTAL) BY MOUTH DAILY WITH SUPPER, Disp: 90 tablet, Rfl: 1   sitaGLIPtin (JANUVIA) 100 MG tablet, TAKE 1 TABLET BY MOUTH EVERY DAY, Disp: 90 tablet, Rfl: 1   TETRAHYDROZOLINE HCL OP, Apply to eye. Reported on 01/02/2016, Disp: , Rfl:  Social History   Socioeconomic History   Marital status: Divorced    Spouse name: Not on file   Number of children: 2   Years of education: 12   Highest education level: High school graduate  Occupational History   Occupation: Retired  Tobacco Use   Smoking status: Never   Smokeless tobacco: Never  Vaping Use   Vaping status: Never Used  Substance and Sexual Activity   Alcohol use: No   Drug use: No   Sexual activity: Not Currently  Other Topics Concern   Not on file  Social History Narrative   Lives alone. Children live nearby   Social Determinants of Health   Financial Resource Strain: Low Risk  (11/07/2022)   Overall Financial Resource Strain (CARDIA)    Difficulty of Paying Living Expenses: Not hard at all  Food Insecurity: No Food Insecurity (11/07/2022)   Hunger Vital Sign    Worried About Running Out of Food in the Last Year: Never true    Ran Out of Food in the Last Year: Never true  Transportation Needs: No Transportation Needs (11/07/2022)   PRAPARE - Administrator, Civil Service (Medical): No    Lack of Transportation (Non-Medical): No  Physical Activity: Inactive (11/07/2022)   Exercise Vital Sign    Days of Exercise per Week: 0 days    Minutes of Exercise per Session: 0 min  Stress: No Stress Concern Present (11/07/2022)   [employer] of Occupational Health - Occupational Stress  Questionnaire    Feeling of Stress : Not at all  Social Connections: Moderately Isolated (11/07/2022)   Social Connection and Isolation Panel [NHANES]    Frequency of Communication with Friends and Family: More than three times a week    Frequency of Social Gatherings with Friends and Family: More than three times a week    Attends Religious Services: Never    Database administrator or Organizations: Yes  Attends Banker Meetings: 1 to 4 times per year    Marital Status: Divorced  Intimate Partner Violence: Not At Risk (11/07/2022)   Humiliation, Afraid, Rape, and Kick questionnaire    Fear of Current or Ex-Partner: No    Emotionally Abused: No    Physically Abused: No    Sexually Abused: No   Family History  Problem Relation Age of Onset   Stroke Mother    Diabetes Mother    Heart disease Father    Atrial fibrillation Father    Cancer Sister        ovarian / colon   Diabetes Sister    Heart disease Brother    Hyperlipidemia Brother    Hypertension Brother    Diabetes Brother    Heart attack Brother 43       had to perform emergency surgery   Dementia Brother    Hyperlipidemia Sister    Cancer Sister        liver   Hyperlipidemia Sister    Diabetes Sister        Boarderline DM   Cancer Sister    Diabetes Sister    Diabetes Daughter    Rheum arthritis Daughter    Psoriasis Daughter     Objective: Office vital signs reviewed. BP (!) 164/89 (BP Location: Left Arm, Patient Position: Standing, Cuff Size: Normal)   Pulse 76   Temp 98.6 F (37 C)   Ht 5\' 4"  (1.626 m)   Wt 152 lb 12.8 oz (69.3 kg)   SpO2 96%   BMI 26.23 kg/m   Physical Examination:  General: Awake, alert, well nourished, No acute distress HEENT: sclera white, MMM Cardio: regular rate and rhythm, S1S2 heard, systolic murmurs appreciated Pulm: clear to auscultation bilaterally, no wheezes, rhonchi or rales; normal work of breathing on room air Neuro: No focal neurologic deficits.  No  nystagmus  Orthostatic Vitals for the past 48 hrs (Last 6 readings):  Patient Position BP Pulse BP Location Cuff Size  03/13/23 1315 -- (!) 146/89 80 -- --  03/13/23 1350 Sitting (!) 141/76 67 Left Arm Normal  03/13/23 1352 Supine (!) 163/95 84 Left Arm Normal  03/13/23 1355 Standing (!) 164/89 76 Left Arm Normal   Assessment/ Plan: 82 y.o. female   Dizziness  Encounter for immunization - Plan: Flu Vaccine Trivalent High Dose (Fluad)  Hypertension associated with diabetes (HCC)  I have given her Allegra to replace the Regitine and Nasacort.  May continue Astelin.  If symptoms are persistent despite medications, can consider referral to ENT given tinnitus and other ongoing symptoms.  Question inner ear dysregulation  Additionally, orthostatics were negative.  Recommended more consistent use of half tablet of lisinopril daily.  Would like to reevaluate her again in the next month or so, sooner if concerns arise  Meds ordered this encounter  Medications   fexofenadine (ALLEGRA ALLERGY) 180 MG tablet    Sig: Take 1 tablet (180 mg total) by mouth daily.    Dispense:  5 tablet    Refill:  0   triamcinolone (NASACORT) 55 MCG/ACT AERO nasal inhaler    Sig: Place 2 sprays into the nose daily.      Raliegh Ip, DO [facility] [phone]

## 2023-03-19 DIAGNOSIS — I050 Rheumatic mitral stenosis: Secondary | ICD-10-CM | POA: Diagnosis not present

## 2023-03-21 DIAGNOSIS — H52223 Regular astigmatism, bilateral: Secondary | ICD-10-CM | POA: Diagnosis not present

## 2023-03-21 DIAGNOSIS — H5203 Hypermetropia, bilateral: Secondary | ICD-10-CM | POA: Diagnosis not present

## 2023-03-22 DIAGNOSIS — I495 Sick sinus syndrome: Secondary | ICD-10-CM | POA: Diagnosis not present

## 2023-04-10 DIAGNOSIS — E039 Hypothyroidism, unspecified: Secondary | ICD-10-CM | POA: Diagnosis not present

## 2023-04-10 DIAGNOSIS — Z9889 Other specified postprocedural states: Secondary | ICD-10-CM | POA: Diagnosis not present

## 2023-04-10 DIAGNOSIS — I455 Other specified heart block: Secondary | ICD-10-CM | POA: Diagnosis not present

## 2023-04-10 DIAGNOSIS — I480 Paroxysmal atrial fibrillation: Secondary | ICD-10-CM | POA: Diagnosis not present

## 2023-04-10 DIAGNOSIS — Z950 Presence of cardiac pacemaker: Secondary | ICD-10-CM | POA: Diagnosis not present

## 2023-04-10 DIAGNOSIS — I272 Pulmonary hypertension, unspecified: Secondary | ICD-10-CM | POA: Diagnosis not present

## 2023-04-10 DIAGNOSIS — I4892 Unspecified atrial flutter: Secondary | ICD-10-CM | POA: Diagnosis not present

## 2023-04-10 DIAGNOSIS — I340 Nonrheumatic mitral (valve) insufficiency: Secondary | ICD-10-CM | POA: Diagnosis not present

## 2023-04-10 DIAGNOSIS — I495 Sick sinus syndrome: Secondary | ICD-10-CM | POA: Diagnosis not present

## 2023-04-10 DIAGNOSIS — E119 Type 2 diabetes mellitus without complications: Secondary | ICD-10-CM | POA: Diagnosis not present

## 2023-04-10 DIAGNOSIS — I10 Essential (primary) hypertension: Secondary | ICD-10-CM | POA: Diagnosis not present

## 2023-04-17 ENCOUNTER — Telehealth: Payer: Self-pay | Admitting: Family Medicine

## 2023-04-17 DIAGNOSIS — E1159 Type 2 diabetes mellitus with other circulatory complications: Secondary | ICD-10-CM

## 2023-04-22 MED ORDER — LISINOPRIL 2.5 MG PO TABS: 2.5000 mg | ORAL_TABLET | Freq: Every day | ORAL | 3 refills | Status: DC

## 2023-04-22 NOTE — Telephone Encounter (Signed)
Meds ordered this encounter  Medications   lisinopril (ZESTRIL) 2.5 MG tablet    Sig: Take 1 tablet (2.5 mg total) by mouth daily.    Dispense:  100 tablet    Refill:  3

## 2023-05-24 ENCOUNTER — Ambulatory Visit (INDEPENDENT_AMBULATORY_CARE_PROVIDER_SITE_OTHER): Payer: Medicare Other | Admitting: Family Medicine

## 2023-05-24 ENCOUNTER — Encounter: Payer: Self-pay | Admitting: Family Medicine

## 2023-05-24 VITALS — BP 120/68 | HR 82 | Temp 98.7°F | Ht 64.0 in | Wt 153.0 lb

## 2023-05-24 DIAGNOSIS — Z7984 Long term (current) use of oral hypoglycemic drugs: Secondary | ICD-10-CM

## 2023-05-24 DIAGNOSIS — E785 Hyperlipidemia, unspecified: Secondary | ICD-10-CM | POA: Diagnosis not present

## 2023-05-24 DIAGNOSIS — E034 Atrophy of thyroid (acquired): Secondary | ICD-10-CM

## 2023-05-24 DIAGNOSIS — E119 Type 2 diabetes mellitus without complications: Secondary | ICD-10-CM

## 2023-05-24 DIAGNOSIS — E1169 Type 2 diabetes mellitus with other specified complication: Secondary | ICD-10-CM | POA: Diagnosis not present

## 2023-05-24 DIAGNOSIS — E1159 Type 2 diabetes mellitus with other circulatory complications: Secondary | ICD-10-CM | POA: Diagnosis not present

## 2023-05-24 DIAGNOSIS — I152 Hypertension secondary to endocrine disorders: Secondary | ICD-10-CM | POA: Diagnosis not present

## 2023-05-24 LAB — BAYER DCA HB A1C WAIVED: HB A1C (BAYER DCA - WAIVED): 7.2 % — ABNORMAL HIGH (ref 4.8–5.6)

## 2023-05-24 NOTE — Progress Notes (Signed)
Subjective: CC:DM PCP: Raliegh Ip, DO SWF:UXNA [name] is a 82 y.o. female presenting to clinic today for:  1. Type 2 Diabetes with hypertension, hyperlipidemia:  She is compliant with [place] and Januvia.  No longer taking Actos or metformin.  She does not report any hypoglycemic episodes and in fact most blood sugars are running 130s to 150s.  On average, her blood sugar is running 107-140s over 60s to 90s  Diabetes Health Maintenance Due  Topic Date Due   OPHTHALMOLOGY EXAM  05/24/2023 (Originally 11/22/2022)   HEMOGLOBIN A1C  08/24/2023   FOOT EXAM  05/23/2024    Last A1c:  Lab Results  Component Value Date   HGBA1C 7.0 (H) 02/21/2023    ROS: Denies dizziness, LOC, polyuria, polydipsia, unintended weight loss/gain, foot ulcerations, numbness or tingling in extremities, shortness of breath or chest pain.  Denies any dysuria, hematuria or vaginitis.  2.  Hypothyroidism Compliant with medications.  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 For cholesterol, Disp: 90 tablet, Rfl: 3   cholecalciferol (VITAMIN D) 1000 UNITS tablet, Take 1,000 Units by mouth daily., Disp: , Rfl:    dapagliflozin propanediol (FARXIGA) 10 MG TABS tablet, Take 1 tablet (10 mg total) by mouth daily before breakfast. To REPLACE jardiance, Disp: 90 tablet, Rfl: 3   diclofenac Sodium (VOLTAREN) 1 % GEL, APPLY 2 GRAMS TOPICALLY 4 (FOUR) TIMES DAILY. (AS NEEDED FOR JOINT PAIN), Disp: 400 g, Rfl: 3   diltiazem (CARDIZEM CD) 120 MG 24 hr capsule, TAKE 1 CAPSULE BY MOUTH EVERY DAY, Disp: 90 capsule, Rfl: 1   Efinaconazole (JUBLIA) 10 % SOLN, Apply to affected areas daily as directed for nail fungus x48 weeks, Disp: 8 mL, Rfl: 3   ezetimibe (ZETIA) 10 MG tablet, Take 1 tablet (10 mg total) by mouth daily., Disp: 90 tablet, Rfl: 3   fexofenadine (ALLEGRA ALLERGY) 180 MG tablet, Take 1 tablet (180 mg total) by mouth daily., Disp: 5 tablet, Rfl: 0   furosemide (LASIX) 40 MG tablet, TAKE 1 TABLET (40 MG TOTAL) BY MOUTH DAILY AS NEEDED  FOR FLUID OR EDEMA., Disp: 90 tablet, Rfl: 0   gabapentin (NEURONTIN) 100 MG capsule, TAKE 2 CAPSULES BY MOUTH 3 TIMES A DAY, Disp: 180 capsule, Rfl: 2   glucose blood (ACCU-CHEK AVIVA PLUS) test strip, CHECK BLOOD SUGAR ONCE A DAY  Dx E11.9, Disp: 100 strip, Rfl: 3   hydrocortisone (ANUSOL-HC) 2.5 % rectal cream, PLACE 1 APPLICATION RECTALLY AS NEEDED FOR HEMORRHOID FLARES- MAY USE 7 CONSECUTIVE DAYS, Disp: 30 g, Rfl: 1   levothyroxine (SYNTHROID) 125 MCG tablet, TAKE 1 TABLET BY MOUTH DAILY BEFORE BREAKFAST., Disp: 90 tablet, Rfl: 2   lidocaine (LIDODERM) 5 %, Place 1 patch onto the skin daily. Remove & Discard patch within 12 hours or as directed by MD, Disp: 30 patch, Rfl: 3   lidocaine (XYLOCAINE) 2 % jelly, Apply 1 application  topically 3 (three) times daily. Apply to hemorrhoid when painful, Disp: 30 mL, Rfl: 1   lisinopril (ZESTRIL) 2.5 MG tablet, Take 1 tablet (2.5 mg total) by mouth daily., Disp: 100 tablet, Rfl: 3   magnesium oxide (MAG-OX) 400 MG tablet, 400 mg., Disp: , Rfl:    meclizine (ANTIVERT) 50 MG tablet, Take 1 tablet (50 mg total) by mouth 3 (three) times daily as needed., Disp: 60 tablet, Rfl: 0   pioglitazone (ACTOS) 15 MG tablet, TAKE 1 TABLET (15 MG TOTAL) BY MOUTH DAILY. FOR SUGAR. TO REPLACE METFORMIN, Disp: 90 tablet, Rfl: 0   Potassium Chloride ER 20 MEQ TBCR, TAKE 1 TABLET BY MOUTH EVERY DAY, Disp: 90 tablet, Rfl: 1   PREMARIN vaginal cream, USING APPLICATOR PLACE 1 GRAM IN VAGINA EVERY OTHER NIGHT AS INSTRUCTED, Disp: 30 g, Rfl: 3   Rivaroxaban (XARELTO) 15 MG TABS tablet, TAKE 1 TABLET (15 MG TOTAL) BY MOUTH DAILY WITH SUPPER, Disp: 90 tablet, Rfl: 1   sitaGLIPtin (JANUVIA) 100 MG tablet, TAKE 1 TABLET BY MOUTH EVERY DAY, Disp: 90 tablet, Rfl: 1   TETRAHYDROZOLINE HCL OP, Apply to eye. Reported on 01/02/2016, Disp: , Rfl:    triamcinolone (NASACORT) 55 MCG/ACT AERO nasal inhaler, Place 2 sprays into the nose daily., Disp: , Rfl:  Social History   Socioeconomic History   Marital status: Divorced    Spouse name: Not on file   Number of children: 2   Years of education: 12   Highest education level: High school graduate  Occupational History   Occupation: Retired  Tobacco Use   Smoking status: Never   Smokeless tobacco: Never  Vaping Use   Vaping status: Never Used  Substance and Sexual Activity   Alcohol use: No   Drug use: No   Sexual activity: Not Currently  Other Topics Concern   Not on file  Social History Narrative   Lives alone. Children live nearby   Social Determinants of Health   Financial Resource Strain: Low Risk  (11/07/2022)   Overall Financial Resource Strain (CARDIA)    Difficulty of Paying Living Expenses: Not hard at all  Food Insecurity: Low Risk  (03/19/2023)    Received from [facility]   Hunger Vital Sign    Worried About Running Out of Food in the Last Year: Never true    Ran Out of Food in the Last Year: Never true  Transportation Needs: No Transportation Needs (03/19/2023)   Received from [facility]    In the past 12 months, has lack of reliable transportation kept you from medical appointments, meetings, work or from getting things needed for daily living? : No  Physical Activity: Inactive (11/07/2022)   Exercise Vital Sign    Days of Exercise per Week: 0 days    Minutes of Exercise per Session: 0 min  Stress: No Stress Concern Present (11/07/2022)   [employer] of Occupational Health - Occupational Stress Questionnaire    Feeling of Stress : Not at all  Social Connections: Moderately Isolated (11/07/2022)   Social Connection and Isolation Panel [NHANES]    Frequency of Communication with Friends and Family: More than three times a week    Frequency of Social Gatherings with Friends and Family: More than three times a week    Attends Religious Services: Never    Database administrator or Organizations: Yes    Attends Banker Meetings: 1 to 4 times per year    Marital Status: Divorced  Intimate Partner Violence: Not At Risk (11/07/2022)   Humiliation, Afraid, Rape, and Kick questionnaire    Fear of Current or Ex-Partner: No    Emotionally Abused: No    Physically Abused: No    Sexually Abused: No   Family History  Problem Relation Age of Onset   Stroke Mother    Diabetes Mother    Heart disease Father    Atrial fibrillation Father    Cancer Sister        ovarian / colon   Diabetes Sister    Heart disease Brother    Hyperlipidemia Brother    Hypertension Brother    Diabetes Brother    Heart attack Brother 71       had to perform emergency surgery   Dementia Brother    Hyperlipidemia Sister    Cancer Sister        liver   Hyperlipidemia Sister    Diabetes Sister        Boarderline DM    Cancer Sister    Diabetes Sister    Diabetes Daughter    Rheum arthritis Daughter    Psoriasis Daughter     Objective: Office vital signs reviewed. BP 120/68 Comment: home BP  Pulse 82   Temp 98.7 F (37.1 C)   Ht 5\' 4"  (1.626 m)   Wt 153 lb (69.4 kg)   SpO2 97%   BMI 26.26 kg/m   Physical Examination:  General: Awake, alert, well nourished, No acute distress HEENT: sclera white, MMM.  No exophthalmos Cardio: regular rate and rhythm, S1S2 heard, no murmurs appreciated Pulm: clear to auscultation bilaterally, no wheezes, rhonchi or rales; normal work of breathing on room air Extremities: warm, well perfused, No edema, cyanosis or clubbing; +2 pulses bilaterally Neuro: No tremor  Diabetic Foot Exam - Simple   Simple Foot Form Diabetic Foot exam was performed with the following findings: Yes 05/24/2023  3:53 PM  Visual Inspection No deformities, no ulcerations, no other skin breakdown bilaterally: Yes Sensation Testing Intact to touch and monofilament testing bilaterally: Yes Pulse Check Posterior Tibialis and Dorsalis pulse intact bilaterally: Yes Comments      Assessment/ Plan: 82 y.o. female   Diabetes mellitus treated with oral medication (HCC) - Plan: Bayer DCA Hb A1c Waived  Hypertension associated with diabetes (HCC) - Plan: Basic Metabolic Panel  Hyperlipidemia associated with type 2 diabetes mellitus (HCC)  Hypothyroidism due to acquired atrophy of thyroid - Plan: TSH, T4, Free  Sugar up slightly since last check with A1c of 7.2.  Given advanced age I think this is in acceptable range however I would like her to keep closer to 7 especially going  into the holidays.  Check renal function.  Continue current regimen for both blood pressure, sugar and cholesterol control  Check thyroid levels.  Raliegh Ip, DO [facility] [phone]

## 2023-05-25 LAB — BASIC METABOLIC PANEL
BUN/Creatinine Ratio: 18 (ref 12–28)
BUN: 19 mg/dL (ref 8–27)
CO2: 24 mmol/L (ref 20–29)
Calcium: 10.2 mg/dL (ref 8.7–10.3)
Chloride: 99 mmol/L (ref 96–106)
Creatinine, Ser: 1.04 mg/dL — ABNORMAL HIGH (ref 0.57–1.00)
Glucose: 122 mg/dL — ABNORMAL HIGH (ref 70–99)
Potassium: 3.8 mmol/L (ref 3.5–5.2)
Sodium: 139 mmol/L (ref 134–144)
eGFR: 54 mL/min/{1.73_m2} — ABNORMAL LOW (ref >59)

## 2023-05-25 LAB — TSH: TSH: 3.13 u[IU]/mL (ref 0.450–4.500)

## 2023-05-25 LAB — T4, FREE: Free T4: 1.57 ng/dL (ref 0.82–1.77)

## 2023-05-26 ENCOUNTER — Other Ambulatory Visit: Payer: Self-pay | Admitting: Family Medicine

## 2023-05-26 DIAGNOSIS — E1159 Type 2 diabetes mellitus with other circulatory complications: Secondary | ICD-10-CM

## 2023-05-26 DIAGNOSIS — E034 Atrophy of thyroid (acquired): Secondary | ICD-10-CM

## 2023-06-16 ENCOUNTER — Other Ambulatory Visit: Payer: Self-pay | Admitting: Family Medicine

## 2023-06-16 DIAGNOSIS — I10 Essential (primary) hypertension: Secondary | ICD-10-CM

## 2023-06-21 ENCOUNTER — Other Ambulatory Visit: Payer: Self-pay | Admitting: Family Medicine

## 2023-06-21 DIAGNOSIS — I480 Paroxysmal atrial fibrillation: Secondary | ICD-10-CM | POA: Diagnosis not present

## 2023-06-24 DIAGNOSIS — Z45018 Encounter for adjustment and management of other part of cardiac pacemaker: Secondary | ICD-10-CM | POA: Diagnosis not present

## 2023-06-29 ENCOUNTER — Other Ambulatory Visit: Payer: Self-pay | Admitting: Family Medicine

## 2023-06-29 DIAGNOSIS — E1169 Type 2 diabetes mellitus with other specified complication: Secondary | ICD-10-CM

## 2023-06-29 DIAGNOSIS — I152 Hypertension secondary to endocrine disorders: Secondary | ICD-10-CM

## 2023-07-08 ENCOUNTER — Ambulatory Visit: Payer: Self-pay | Admitting: Family Medicine

## 2023-07-08 NOTE — Telephone Encounter (Signed)
 Copied from CRM [id number]. Topic: Clinical - Medication Refill >> Jul 08, 2023 12:50 PM Elle L wrote: Most Recent Primary Care Visit:  Provider: JOLINDA NORENE HERO  Department: [facility]  Visit Type: OFFICE VISIT  Date: 05/24/2023  Medication: Accu-Chek blood sugar monitor  Has the patient contacted their pharmacy? Yes, hers has broken and the pharmacy advised her to reach out to her provider for a prescription.  Is this the correct pharmacy for this prescription? Yes  [id number] - [place], [place] - [address] [place] [address] Phone: [phone] Fax: [phone]  Has the prescription been filled recently? Yes  Is the patient out of the medication? Yes  Has the patient been seen for an appointment in the last year OR does the patient have an upcoming appointment? Yes  Can we respond through MyChart? No  Agent: Please be advised that Rx refills may take up to 3 business days. We ask that you follow-up with your pharmacy.   Chief Complaint: Requesting RX for Accu-Check Blood glucose monitor   Additional Notes: Pt states that her blood glucose monitor hasn't been working properly, keeps shutting off unexpectedly.Pt is requesting prescription for new one so insurance will cover.    Reason for Disposition  [1] Caller has NON-URGENT medicine question about med that PCP prescribed AND [2] triager unable to answer question  Answer Assessment - Initial Assessment Questions 1. DRUG NAME: What medicine do you need to have refilled?     Accu-check, blood sugar monitor  2. REFILLS REMAINING: How many refills are remaining? (Note: The label on the medicine or pill bottle will show how many refills are remaining. If there are no refills remaining, then a renewal may be needed.)     No refills  3. EXPIRATION DATE: What is the expiration date? (Note: The label states when the prescription will expire, and thus can no longer be  refilled.)     N/a  4. PRESCRIBING HCP: Who prescribed it? Reason: If prescribed by specialist, call should be referred to that group.     Dr. Jolinda  5. SYMPTOMS: Do you have any symptoms?     No  Protocols used: Medication Refill and Renewal Call-A-AH

## 2023-07-18 ENCOUNTER — Telehealth: Payer: Self-pay | Admitting: Family Medicine

## 2023-07-18 DIAGNOSIS — E119 Type 2 diabetes mellitus without complications: Secondary | ICD-10-CM

## 2023-07-18 NOTE — Telephone Encounter (Signed)
Copied from CRM [id number]. Topic: Clinical - Prescription Issue >> Jul 18, 2023 11:04 AM Shelah Lewandowsky wrote: Reason for CRM: Still waiting for prescription for glucose meter, pharmacy has not received anything.  Please call patient [phone]

## 2023-07-19 MED ORDER — LANCETS MISC. MISC: 4 refills | Status: AC

## 2023-07-19 MED ORDER — BLOOD GLUCOSE TEST VI STRP: ORAL_STRIP | 4 refills | Status: AC

## 2023-07-19 MED ORDER — LANCET DEVICE MISC: 0 refills | Status: AC

## 2023-07-19 MED ORDER — BLOOD GLUCOSE MONITORING SUPPL DEVI: 0 refills | Status: AC

## 2023-07-19 NOTE — Addendum Note (Signed)
Addended by: Raliegh Ip on: 07/19/2023 07:42 AM   Modules accepted: Orders

## 2023-08-02 ENCOUNTER — Other Ambulatory Visit: Payer: Self-pay | Admitting: Family Medicine

## 2023-08-05 ENCOUNTER — Other Ambulatory Visit: Payer: Self-pay | Admitting: Family Medicine

## 2023-08-05 DIAGNOSIS — G8929 Other chronic pain: Secondary | ICD-10-CM

## 2023-08-15 ENCOUNTER — Telehealth: Payer: Self-pay | Admitting: Family Medicine

## 2023-08-15 NOTE — Telephone Encounter (Unsigned)
Copied from CRM [id number]. Topic: Clinical - Medical Advice >> Aug 15, 2023  8:42 AM Meredith Anderson wrote: Reason for CRM: Patient has been experiencing stuffy nose, sore throat, coughing, no fever. Patient did not want an appointment at this time, she would like to speak with a nurse.

## 2023-08-15 NOTE — Telephone Encounter (Signed)
Called and answered questions for patient

## 2023-08-22 ENCOUNTER — Other Ambulatory Visit: Payer: Self-pay | Admitting: Family Medicine

## 2023-08-22 DIAGNOSIS — E1159 Type 2 diabetes mellitus with other circulatory complications: Secondary | ICD-10-CM

## 2023-08-27 ENCOUNTER — Encounter: Payer: Self-pay | Admitting: Family Medicine

## 2023-08-27 ENCOUNTER — Ambulatory Visit (INDEPENDENT_AMBULATORY_CARE_PROVIDER_SITE_OTHER): Payer: Medicare Other | Admitting: Family Medicine

## 2023-08-27 VITALS — BP 148/74 | HR 69 | Temp 97.9°F | Ht 64.0 in | Wt 145.0 lb

## 2023-08-27 DIAGNOSIS — J019 Acute sinusitis, unspecified: Secondary | ICD-10-CM

## 2023-08-27 DIAGNOSIS — B9689 Other specified bacterial agents as the cause of diseases classified elsewhere: Secondary | ICD-10-CM

## 2023-08-27 DIAGNOSIS — J209 Acute bronchitis, unspecified: Secondary | ICD-10-CM | POA: Diagnosis not present

## 2023-08-27 MED ORDER — FLUCONAZOLE 150 MG PO TABS: 150.0000 mg | ORAL_TABLET | Freq: Once | ORAL | 0 refills | Status: AC

## 2023-08-27 MED ORDER — CEFDINIR 300 MG PO CAPS: 300.0000 mg | ORAL_CAPSULE | Freq: Two times a day (BID) | ORAL | 0 refills | Status: DC

## 2023-08-27 MED ORDER — METHYLPREDNISOLONE ACETATE 40 MG/ML IJ SUSP: 40.0000 mg | Freq: Once | INTRAMUSCULAR | Status: DC

## 2023-08-27 MED ORDER — GUAIFENESIN-CODEINE 100-10 MG/5ML PO SOLN: 5.0000 mL | Freq: Four times a day (QID) | ORAL | 0 refills | Status: DC | PRN

## 2023-08-27 NOTE — Progress Notes (Signed)
 Subjective: CC: Initially for diabetes but feeling poorly so we will change to sick visit PCP: Meredith Anderson [id number] Meredith Anderson is a 83 y.o. female presenting to clinic today for:  1.  Illness Patient reports a 1 week history of cough, congestion.  She is now having sinus pressure and dizziness.  She reports no fevers, no myalgia, no sick contact.  She has had productive cough with green sputum.  Denies any hemoptysis or brown sputum.  Apparently called last week but did not feel good enough to come in for visit.  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 For cholesterol, Disp: 90 tablet, Rfl: 3   Blood Glucose Monitoring Suppl DEVI, Check BGs daily E11.9. May substitute to any manufacturer covered by patient's insurance., Disp: 1 each, Rfl: 0    cholecalciferol (VITAMIN D) 1000 UNITS tablet, Take 1,000 Units by mouth daily., Disp: , Rfl:    dapagliflozin propanediol (FARXIGA) 10 MG TABS tablet, Take 1 tablet (10 mg total) by mouth daily before breakfast. To REPLACE jardiance, Disp: 90 tablet, Rfl: 3   diclofenac Sodium (VOLTAREN) 1 % GEL, APPLY 2 GRAMS TOPICALLY 4 (FOUR) TIMES DAILY. (AS NEEDED FOR JOINT PAIN), Disp: 400 g, Rfl: 3   diltiazem (CARDIZEM CD) 120 MG 24 hr capsule, TAKE 1 CAPSULE BY MOUTH EVERY DAY, Disp: 90 capsule, Rfl: 0   Efinaconazole (JUBLIA) 10 % SOLN, Apply to affected areas daily as directed for nail fungus x48 weeks, Disp: 8 mL, Rfl: 3   ezetimibe (ZETIA) 10 MG tablet, Take 1 tablet (10 mg total) by mouth daily., Disp: 90 tablet, Rfl: 3   fexofenadine (ALLEGRA ALLERGY) 180 MG tablet, Take 1 tablet (180 mg total) by mouth daily., Disp: 5 tablet, Rfl: 0   furosemide (LASIX) 40 MG tablet, TAKE 1 TABLET (40 MG TOTAL) BY MOUTH DAILY AS NEEDED FOR FLUID OR EDEMA., Disp: 90 tablet, Rfl: 0   gabapentin (NEURONTIN) 100 MG capsule, TAKE 2 CAPSULES BY MOUTH 3 TIMES A DAY, Disp: 180 capsule, Rfl: 0   glucose blood (ACCU-CHEK AVIVA PLUS) test strip, CHECK  BLOOD SUGAR ONCE A DAY  Dx E11.9, Disp: 100 strip, Rfl: 3   Glucose Blood (BLOOD GLUCOSE TEST STRIPS) STRP, Check BGS daily Ell.9. May substitute to any manufacturer covered by patient's insurance., Disp: 100 strip, Rfl: 4   hydrocortisone (ANUSOL-HC) 2.5 % rectal cream, PLACE 1 APPLICATION RECTALLY AS NEEDED FOR HEMORRHOID FLARES- MAY USE 7 CONSECUTIVE DAYS, Disp: 30 g, Rfl: 1   JANUVIA 100 MG tablet, TAKE 1 TABLET BY MOUTH EVERY DAY, Disp: 90 tablet, Rfl: 0   Lancet Device MISC, Check BGs daily E11.9 May substitute to any manufacturer covered by patient's insurance., Disp: 1 each, Rfl: 0   Lancets Misc. MISC, Check BGs daily E11.9 May substitute to any manufacturer covered by patient's insurance., Disp: 100 each, Rfl: 4   levothyroxine (SYNTHROID) 125 MCG tablet, TAKE 1 TABLET BY  MOUTH EVERY DAY BEFORE BREAKFAST, Disp: 90 tablet, Rfl: 3   lidocaine (LIDODERM) 5 %, Place 1 patch onto the skin daily. Remove & Discard patch within 12 hours or as directed by MD, Disp: 30 patch, Rfl: 3   lidocaine (XYLOCAINE) 2 % jelly, Apply 1 application topically 3 (three) times daily. Apply to hemorrhoid when painful, Disp: 30 mL, Rfl: 1   lisinopril (ZESTRIL) 2.5 MG tablet, Take 1 tablet (2.5 mg total) by mouth daily., Disp: 100 tablet, Rfl: 3   magnesium oxide (MAG-OX) 400 MG tablet, 400 mg., Disp: , Rfl:    meclizine (ANTIVERT) 50 MG tablet, Take 1 tablet (50 mg total) by mouth 3 (three) times daily as needed., Disp: 60 tablet, Rfl: 0   pioglitazone (ACTOS) 15 MG tablet, TAKE 1 TABLET (15 MG TOTAL) BY MOUTH DAILY. FOR SUGAR. TO REPLACE METFORMIN, Disp: 90 tablet, Rfl: 0   Potassium Chloride ER 20 MEQ TBCR, TAKE 1 TABLET BY MOUTH EVERY DAY, Disp: 90 tablet, Rfl: 0   PREMARIN vaginal cream, USING APPLICATOR PLACE 1 GRAM IN VAGINA EVERY OTHER NIGHT AS INSTRUCTED, Disp: 30 g, Rfl: 3   Rivaroxaban (XARELTO) 15 MG TABS tablet, TAKE 1 TABLET (15 MG TOTAL) BY MOUTH DAILY WITH SUPPER, Disp: 90 tablet, Rfl: 0   TETRAHYDROZOLINE HCL OP, Apply to eye. Reported on 01/02/2016, Disp: , Rfl:    triamcinolone (NASACORT) 55 MCG/ACT AERO nasal inhaler, Place 2 sprays into the nose daily., Disp: , Rfl:  Social History   Socioeconomic History   Marital status: Divorced    Spouse name: Not on file   Number of children: 2   Years of education: 12   Highest education level: High school graduate  Occupational History   Occupation: Retired  Tobacco Use   Smoking status: Never   Smokeless tobacco: Never  Vaping Use   Vaping status: Never Used  Substance and Sexual Activity   Alcohol use: No   Drug use: No   Sexual activity: Not Currently  Other Topics Concern   Not on file  Social History Narrative   Lives alone. Children live nearby   Social Drivers of Health   Financial Resource Strain: Low Risk   (11/07/2022)   Overall Financial Resource Strain (CARDIA)    Difficulty of Paying Living Expenses: Not hard at all  Food Insecurity: Low Risk  (03/19/2023)   Received from [facility]   Hunger Vital Sign    Worried About Running Out of Food in the Last Year: Never true    Ran Out of Food in the Last Year: Never true  Transportation Needs: No Transportation Needs (03/19/2023)   Received [facility]  Transportation    In the past 12 months, has lack of reliable transportation kept you from medical appointments, meetings, work or from getting things needed for daily living? : No  Physical Activity: Inactive (11/07/2022)   Exercise Vital Sign    Days of Exercise per Week: 0 days    Minutes of Exercise per Session: 0 min  Stress: No Stress Concern Present (11/07/2022)   [employer] of Occupational Health - Occupational Stress Questionnaire    Feeling of Stress : Not at all  Social Connections: Moderately Isolated (11/07/2022)   Social Connection and Isolation Panel [NHANES]    Frequency of Communication with Friends and Family: More than three times a week    Frequency of Social Gatherings with Friends and Family: More than three times a week    Attends Religious Services: Never    Database administrator or Organizations: Yes    Attends Banker Meetings: 1 to 4 times per year    Marital Status: Divorced  Intimate Partner Violence: Not At Risk (11/07/2022)   Humiliation, Afraid, Rape, and Kick questionnaire    Fear of Current or Ex-Partner: No    Emotionally Abused: No    Physically Abused: No    Sexually Abused: No   Family History  Problem Relation Age of Onset   Stroke Mother    Diabetes Mother    Heart disease Father    Atrial fibrillation Father    Cancer Sister        ovarian / colon   Diabetes Sister    Heart disease Brother    Hyperlipidemia Brother    Hypertension Brother    Diabetes Brother    Heart attack Brother 55       had to perform emergency  surgery   Dementia Brother    Hyperlipidemia Sister    Cancer Sister        liver   Hyperlipidemia Sister    Diabetes Sister        Boarderline DM   Cancer Sister    Diabetes Sister    Diabetes Daughter    Rheum arthritis Daughter    Psoriasis Daughter     Objective: Office vital signs reviewed. BP (!) 148/74   Pulse 69   Temp 97.9 F (36.6 C)   Ht 5\' 4"  (1.626 m)   Wt 145 lb (65.8 kg)   SpO2 98%   BMI 24.89 kg/m   Physical Examination:  General: Awake, alert, nontoxic but ill-appearing female, No acute distress HEENT: Normal    Neck: No masses palpated.  Mildly enlarged anterior cervical lymph nodes    Ears: Tympanic membranes intact, dulled light reflex bilaterally, no erythema, no bulging    Eyes: PERRLA, extraocular membranes intact, sclera white    Nose: nasal turbinates moist, opaque nasal discharge with erythematous and edematous nasal turbinates    Throat: moist mucus membranes, no erythema, no tonsillar exudate.  Airway is patent Cardio: regular rate and rhythm, S1S2 heard, no murmurs appreciated Pulm: Mild expiratory wheezes noted at the bases bilaterally with good air movement.  Normal work of breathing on room air but she is intermittently coughing MSK: Arrives in wheelchair today  Assessment/ Plan: 83 y.o. female   Acute bacterial sinusitis - Plan: methylPREDNISolone acetate (DEPO-MEDROL) injection 40 mg, cefdinir (OMNICEF) 300 MG capsule, guaiFENesin-codeine 100-10 MG/5ML syrup, fluconazole (DIFLUCAN) 150 MG tablet  Bronchospasm with bronchitis, acute - Plan: methylPREDNISolone acetate (DEPO-MEDROL) injection 40 mg, cefdinir (OMNICEF) 300 MG capsule, guaiFENesin-codeine 100-10 MG/5ML  syrup, fluconazole (DIFLUCAN) 150 MG tablet  I am going to treat her for acute bacterial sinusitis but I Anderson think there is a component of bronchospasm with bronchitis.  Omnicef sent.  Guaifenesin with codeine as needed cough.  Caution sedation.  Diflucan sent in the event she  develops yeast infection.  Depo-Medrol administered for wheezes that were mild on exam but present.  We discussed red flag signs and symptoms which would warrant further evaluation in office and she voiced good understanding.  I did not collect COVID nor influenza testing today due to her being outside of the window for treatment of either of these viral illnesses   Latorie Montesano Hulen Skains, Anderson [facility] [phone]

## 2023-08-27 NOTE — Patient Instructions (Signed)

## 2023-09-04 ENCOUNTER — Telehealth: Payer: Self-pay

## 2023-09-04 NOTE — Telephone Encounter (Signed)
 Copied from CRM [id number]. Topic: General - Call Back - No Documentation >> Sep 04, 2023 11:42 AM Higinio Roger wrote: Reason for CRM: Patient is requesting a callback from Nurse Adelina Mings. Patient stated nurse told her if she did not feel good to call her back. Callback # [phone]

## 2023-09-04 NOTE — Telephone Encounter (Signed)
 If she is having labile blood pressures I want her seen with DOD tomorrow for evaluation.  Please make sure she brings her blood pressure cuff and to ensure calibration

## 2023-09-04 NOTE — Telephone Encounter (Signed)
 Pt states she feels weak , light headed , dizzy , states her bp has been up and down   Would like to know if she can get something called in

## 2023-09-05 NOTE — Telephone Encounter (Signed)
 Called and offered apt , pt declines apt state she can't get here and she don't feel like driving

## 2023-09-09 DIAGNOSIS — I050 Rheumatic mitral stenosis: Secondary | ICD-10-CM | POA: Diagnosis not present

## 2023-09-09 DIAGNOSIS — I080 Rheumatic disorders of both mitral and aortic valves: Secondary | ICD-10-CM | POA: Diagnosis not present

## 2023-09-18 DIAGNOSIS — I10 Essential (primary) hypertension: Secondary | ICD-10-CM | POA: Diagnosis not present

## 2023-09-18 DIAGNOSIS — I110 Hypertensive heart disease with heart failure: Secondary | ICD-10-CM | POA: Diagnosis not present

## 2023-09-18 DIAGNOSIS — I5032 Chronic diastolic (congestive) heart failure: Secondary | ICD-10-CM | POA: Diagnosis not present

## 2023-09-20 DIAGNOSIS — I480 Paroxysmal atrial fibrillation: Secondary | ICD-10-CM | POA: Diagnosis not present

## 2023-09-21 ENCOUNTER — Other Ambulatory Visit: Payer: Self-pay | Admitting: Family Medicine

## 2023-09-21 DIAGNOSIS — I10 Essential (primary) hypertension: Secondary | ICD-10-CM

## 2023-09-25 ENCOUNTER — Encounter: Payer: Self-pay | Admitting: Family Medicine

## 2023-09-25 ENCOUNTER — Ambulatory Visit (INDEPENDENT_AMBULATORY_CARE_PROVIDER_SITE_OTHER): Payer: Medicare Other | Admitting: Family Medicine

## 2023-09-25 ENCOUNTER — Telehealth: Payer: Self-pay | Admitting: Family Medicine

## 2023-09-25 VITALS — BP 151/81 | HR 100 | Temp 98.8°F | Ht 64.0 in

## 2023-09-25 DIAGNOSIS — E119 Type 2 diabetes mellitus without complications: Secondary | ICD-10-CM

## 2023-09-25 DIAGNOSIS — E1169 Type 2 diabetes mellitus with other specified complication: Secondary | ICD-10-CM | POA: Diagnosis not present

## 2023-09-25 DIAGNOSIS — I152 Hypertension secondary to endocrine disorders: Secondary | ICD-10-CM | POA: Diagnosis not present

## 2023-09-25 DIAGNOSIS — E785 Hyperlipidemia, unspecified: Secondary | ICD-10-CM | POA: Diagnosis not present

## 2023-09-25 DIAGNOSIS — I480 Paroxysmal atrial fibrillation: Secondary | ICD-10-CM | POA: Diagnosis not present

## 2023-09-25 DIAGNOSIS — N1831 Chronic kidney disease, stage 3a: Secondary | ICD-10-CM | POA: Diagnosis not present

## 2023-09-25 DIAGNOSIS — Z7984 Long term (current) use of oral hypoglycemic drugs: Secondary | ICD-10-CM | POA: Diagnosis not present

## 2023-09-25 DIAGNOSIS — E1159 Type 2 diabetes mellitus with other circulatory complications: Secondary | ICD-10-CM

## 2023-09-25 DIAGNOSIS — E1122 Type 2 diabetes mellitus with diabetic chronic kidney disease: Secondary | ICD-10-CM | POA: Diagnosis not present

## 2023-09-25 DIAGNOSIS — Z45010 Encounter for checking and testing of cardiac pacemaker pulse generator [battery]: Secondary | ICD-10-CM | POA: Diagnosis not present

## 2023-09-25 LAB — BAYER DCA HB A1C WAIVED: HB A1C (BAYER DCA - WAIVED): 7.3 % — ABNORMAL HIGH (ref 4.8–5.6)

## 2023-09-25 MED ORDER — OZEMPIC (0.25 OR 0.5 MG/DOSE) 2 MG/3ML ~~LOC~~ SOPN: PEN_INJECTOR | SUBCUTANEOUS | 3 refills | Status: DC

## 2023-09-25 NOTE — Progress Notes (Signed)
 Subjective: CC:DM PCP: Raliegh Ip, DO Meredith Anderson is a 83 y.o. female presenting to clinic today for:  1. Type 2 Diabetes with hypertension, hyperlipidemia:  Patient reports compliance with medications but notes that she has been continuing to have some intermittent dizziness.  It got slightly better after her specialist got rid of her lisinopril and reduced her Lasix but she still has it.  She is interested in discontinuing the [place] because she feels like this is the cause.  She however does report that her blood sugars have been higher than normal.  It was 180 this morning and they have been consistently running above 150.  She does not think that she is taking Nexletol anymore.  She is appoint with Zetia, Cardizem and Januvia  Diabetes Health Maintenance Due  Topic Date Due   HEMOGLOBIN A1C  11/21/2023   OPHTHALMOLOGY EXAM  12/19/2023   FOOT EXAM  05/23/2024    Last A1c:  Lab Results  Component Value Date   HGBA1C 7.2 (H) 05/24/2023    ROS: Per HPI  Allergies  Allergen Reactions   Dofetilide Other (See Comments)    Passed out   Doxycycline     diarrhea    Amlodipine     Other reaction(s): Other (See Comments) Swollen ankles   Dronedarone Other (See Comments)    Increased BG   Glipizide-Metformin Hcl     Other reaction(s): Diarrhea, Other (See Comments) Weakness   Metformin     Other reaction(s): Diarrhea   Niaspan [Niacin] Rash   Past Medical History:  Diagnosis Date   A-fib (HCC)    Allergy    seasonal   Anxiety    Arthritis    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 For cholesterol, Disp: 90 tablet, Rfl: 3   Blood Glucose Monitoring Suppl DEVI, Check BGs daily E11.9. May substitute to any manufacturer covered by patient's insurance., Disp: 1 each, Rfl: 0   cholecalciferol (VITAMIN D) 1000 UNITS tablet, Take 1,000 Units by mouth daily., Disp: , Rfl:    dapagliflozin propanediol (FARXIGA) 10 MG TABS tablet, Take 1 tablet (10 mg total) by mouth daily before breakfast. To REPLACE jardiance, Disp: 90 tablet, Rfl: 3   diclofenac Sodium (VOLTAREN) 1 % GEL, APPLY 2 GRAMS TOPICALLY 4 (FOUR) TIMES DAILY. (AS NEEDED FOR JOINT PAIN), Disp: 400 g, Rfl: 3   diltiazem (CARDIZEM CD) 120 MG 24 hr capsule, TAKE 1 CAPSULE BY MOUTH EVERY DAY, Disp: 90 capsule, Rfl: 0   Efinaconazole (JUBLIA) 10 % SOLN, Apply to affected areas daily as directed for nail fungus x48 weeks, Disp: 8 mL, Rfl: 3   ezetimibe (ZETIA) 10 MG tablet, Take 1 tablet (10 mg total) by mouth daily., Disp: 90 tablet, Rfl: 3   fexofenadine (ALLEGRA ALLERGY) 180 MG tablet,  Take 1 tablet (180 mg total) by mouth daily., Disp: 5 tablet, Rfl: 0   furosemide (LASIX) 40 MG tablet, TAKE 1 TABLET (40 MG TOTAL) BY MOUTH DAILY AS NEEDED FOR FLUID OR EDEMA., Disp: 90 tablet, Rfl: 0   gabapentin (NEURONTIN) 100 MG capsule, TAKE 2 CAPSULES BY MOUTH 3 TIMES A DAY, Disp: 180 capsule, Rfl: 0   glucose blood (ACCU-CHEK AVIVA PLUS) test strip, CHECK BLOOD SUGAR ONCE A DAY  Dx E11.9, Disp: 100 strip, Rfl: 3   Glucose Blood (BLOOD GLUCOSE TEST STRIPS) STRP, Check BGS daily Ell.9. May substitute to any manufacturer covered by patient's insurance., Disp: 100 strip, Rfl: 4   hydrocortisone (ANUSOL-HC) 2.5 % rectal cream, PLACE 1 APPLICATION RECTALLY AS NEEDED FOR HEMORRHOID FLARES- MAY USE 7 CONSECUTIVE DAYS, Disp: 30 g, Rfl: 1   JANUVIA 100 MG tablet,  TAKE 1 TABLET BY MOUTH EVERY DAY, Disp: 90 tablet, Rfl: 0   Lancet Device MISC, Check BGs daily E11.9 May substitute to any manufacturer covered by patient's insurance., Disp: 1 each, Rfl: 0   Lancets Misc. MISC, Check BGs daily E11.9 May substitute to any manufacturer covered by patient's insurance., Disp: 100 each, Rfl: 4   levothyroxine (SYNTHROID) 125 MCG tablet, TAKE 1 TABLET BY MOUTH EVERY DAY BEFORE BREAKFAST, Disp: 90 tablet, Rfl: 3   lidocaine (LIDODERM) 5 %, Place 1 patch onto the skin daily. Remove & Discard patch within 12 hours or as directed by MD, Disp: 30 patch, Rfl: 3   lidocaine (XYLOCAINE) 2 % jelly, Apply 1 application topically 3 (three) times daily. Apply to hemorrhoid when painful, Disp: 30 mL, Rfl: 1   lisinopril (ZESTRIL) 2.5 MG tablet, Take 1 tablet (2.5 mg total) by mouth daily., Disp: 100 tablet, Rfl: 3   magnesium oxide (MAG-OX) 400 MG tablet, 400 mg., Disp: , Rfl:    meclizine (ANTIVERT) 50 MG tablet, Take 1 tablet (50 mg total) by mouth 3 (three) times daily as needed., Disp: 60 tablet, Rfl: 0   pioglitazone (ACTOS) 15 MG tablet, TAKE 1 TABLET (15 MG TOTAL) BY MOUTH DAILY. FOR SUGAR. TO REPLACE METFORMIN, Disp: 90 tablet, Rfl: 0   Potassium Chloride ER 20 MEQ TBCR, TAKE 1 TABLET BY MOUTH EVERY DAY, Disp: 90 tablet, Rfl: 0   PREMARIN vaginal cream, USING APPLICATOR PLACE 1 GRAM IN VAGINA EVERY OTHER NIGHT AS INSTRUCTED, Disp: 30 g, Rfl: 3   Rivaroxaban (XARELTO) 15 MG TABS tablet, TAKE 1 TABLET (15 MG TOTAL) BY MOUTH DAILY WITH SUPPER, Disp: 90 tablet, Rfl: 0   TETRAHYDROZOLINE HCL OP, Apply to eye. Reported on 01/02/2016, Disp: , Rfl:    triamcinolone (NASACORT) 55 MCG/ACT AERO nasal inhaler, Place 2 sprays into the nose daily., Disp: , Rfl:  Social History   Socioeconomic History   Marital status: Divorced    Spouse name: Not on file   Number of children: 2   Years of education: 12   Highest education level: High school graduate  Occupational History    Occupation: Retired  Tobacco Use   Smoking status: Never   Smokeless tobacco: Never  Vaping Use   Vaping status: Never Used  Substance and Sexual Activity   Alcohol use: No   Drug use: No   Sexual activity: Not Currently  Other Topics Concern   Not on file  Social History Narrative   Lives alone. Children live nearby   Social Drivers of Health   Financial Resource Strain: Low Risk  (11/07/2022)   Overall Financial Resource Strain (CARDIA)  Difficulty of Paying Living Expenses: Not hard at all  Food Insecurity: Low Risk  (09/18/2023)   Received from [facility]   Hunger Vital Sign    Worried About Running Out of Food in the Last Year: Never true    Ran Out of Food in the Last Year: Never true  Transportation Needs: No Transportation Needs (09/18/2023)   Received from [facility]    In the past 12 months, has lack of reliable transportation kept you from medical appointments, meetings, work or from getting things needed for daily living? : No  Physical Activity: Inactive (11/07/2022)   Exercise Vital Sign    Days of Exercise per Week: 0 days    Minutes of Exercise per Session: 0 min  Stress: No Stress Concern Present (11/07/2022)   [employer] of Occupational Health - Occupational Stress Questionnaire    Feeling of Stress : Not at all  Social Connections: Moderately Isolated (11/07/2022)   Social Connection and Isolation Panel [NHANES]    Frequency of Communication with Friends and Family: More than three times a week    Frequency of Social Gatherings with Friends and Family: More than three times a week    Attends Religious Services: Never    Database administrator or Organizations: Yes    Attends Banker Meetings: 1 to 4 times per year    Marital Status: Divorced  Intimate Partner Violence: Not At Risk (11/07/2022)   Humiliation, Afraid, Rape, and Kick questionnaire    Fear of Current or Ex-Partner: No    Emotionally Abused: No     Physically Abused: No    Sexually Abused: No   Family History  Problem Relation Age of Onset   Stroke Mother    Diabetes Mother    Heart disease Father    Atrial fibrillation Father    Cancer Sister        ovarian / colon   Diabetes Sister    Heart disease Brother    Hyperlipidemia Brother    Hypertension Brother    Diabetes Brother    Heart attack Brother 86       had to perform emergency surgery   Dementia Brother    Hyperlipidemia Sister    Cancer Sister        liver   Hyperlipidemia Sister    Diabetes Sister        Boarderline DM   Cancer Sister    Diabetes Sister    Diabetes Daughter    Rheum arthritis Daughter    Psoriasis Daughter     Objective: Office vital signs reviewed. BP (!) 151/81   Pulse 100   Temp 98.8 F (37.1 C)   Ht 5\' 4"  (1.626 m)   SpO2 97%   BMI 24.89 kg/m   Physical Examination:  General: Awake, alert, well nourished, No acute distress HEENT: Sclera white.  Moist mucous membranes.  No nystagmus Cardio: regular rate and rhythm, S1S2 heard, no murmurs appreciated Pulm: clear to auscultation bilaterally, no wheezes, rhonchi or rales; normal work of breathing on room air   Assessment/ Plan: 83 y.o. female   Diabetes mellitus treated with oral medication (HCC) - Plan: Bayer DCA Hb A1c Waived, Microalbumin / creatinine urine ratio, Semaglutide,0.25 or 0.5MG /DOS, (OZEMPIC, 0.25 OR 0.5 MG/DOSE,) 2 MG/3ML SOPN, AMB Referral [facility] Care Management  Hypertension associated with diabetes (HCC) - Plan: Semaglutide,0.25 or 0.5MG /DOS, (OZEMPIC, 0.25 OR 0.5 MG/DOSE,) 2 MG/3ML SOPN, AMB Referral VBCI Care Management  Hyperlipidemia  associated with type 2 diabetes mellitus (HCC) - Plan: Semaglutide,0.25 or 0.5MG /DOS, (OZEMPIC, 0.25 OR 0.5 MG/DOSE,) 2 MG/3ML SOPN, AMB Referral VBCI Care Management  Stage 3a chronic kidney disease (HCC) - Plan: Semaglutide,0.25 or 0.5MG /DOS, (OZEMPIC, 0.25 OR 0.5 MG/DOSE,) 2 MG/3ML SOPN, AMB Referral VBCI Care  Management  Sugar rising A1c test 7.3 which technically is acceptable.  However, since she wants to come off of Farxiga I think that we can try and transition her over to Ozempic and maybe utilize that as a singular treatment.  She of course will have to come off of Januvia and I verbalized that to her as well as placed it in her AVS for reference.  I placed that a stat referral to clinical pharmacy here in office to review her medications since she was not quite sure what she was taking as far as cholesterol goes and I encouraged her to bring all those medicines into her appointment.  We discussed how to utilize Ozempic.    She will see me back in 3 months, sooner if concerns arise  Raliegh Ip, DO [facility] [phone]

## 2023-09-25 NOTE — Patient Instructions (Signed)
 HOLD farxiga for 1 week. If your symptoms do NOT resolve, restart.  This medication protects your heart and kidneys. STOP Januvia when you start the Ozempic.  See Raynelle Fanning to review meds/ apply for medication assistance if needed.

## 2023-09-26 LAB — MICROALBUMIN / CREATININE URINE RATIO
Creatinine, Urine: 45.4 mg/dL
Microalb/Creat Ratio: 70 mg/g{creat} — ABNORMAL HIGH (ref 0–29)
Microalbumin, Urine: 31.9 ug/mL

## 2023-09-27 ENCOUNTER — Telehealth: Payer: Self-pay

## 2023-09-27 NOTE — Progress Notes (Signed)
 Care Guide Pharmacy Note  09/27/2023 Name: Meredith Anderson MRN: [id number] DOB: 1941/06/15  Referred By: Raliegh Ip, DO Reason for referral: Care Coordination (Outreach to schedule with Pharm d )   Meredith Anderson is a 83 y.o. year old female who is a primary care patient of Raliegh Ip, DO.  Meredith Anderson was referred to the pharmacist for assistance related to: DMII  Successful contact was made with the patient to discuss pharmacy services including being ready for the pharmacist to call at least 5 minutes before the scheduled appointment time and to have medication bottles and any blood pressure readings ready for review. The patient agreed to meet with the pharmacist via face to face 12/24/2023   Penne Lash , RMA     [place]  [facility], [facility] Guide  Direct Dial: [phone]  Website: [email or web address]

## 2023-10-05 ENCOUNTER — Other Ambulatory Visit: Payer: Self-pay | Admitting: Family Medicine

## 2023-10-05 DIAGNOSIS — I152 Hypertension secondary to endocrine disorders: Secondary | ICD-10-CM

## 2023-10-25 ENCOUNTER — Other Ambulatory Visit: Payer: Self-pay | Admitting: Family Medicine

## 2023-10-25 DIAGNOSIS — J019 Acute sinusitis, unspecified: Secondary | ICD-10-CM

## 2023-10-25 MED ORDER — FLUTICASONE PROPIONATE 50 MCG/ACT NA SUSP: 2.0000 | Freq: Every day | NASAL | 1 refills | Status: AC

## 2023-10-30 ENCOUNTER — Other Ambulatory Visit: Payer: Self-pay | Admitting: Family Medicine

## 2023-10-30 DIAGNOSIS — I10 Essential (primary) hypertension: Secondary | ICD-10-CM

## 2023-10-30 DIAGNOSIS — M609 Myositis, unspecified: Secondary | ICD-10-CM

## 2023-10-30 DIAGNOSIS — N1831 Chronic kidney disease, stage 3a: Secondary | ICD-10-CM

## 2023-10-30 DIAGNOSIS — E1169 Type 2 diabetes mellitus with other specified complication: Secondary | ICD-10-CM

## 2023-11-11 ENCOUNTER — Ambulatory Visit: Payer: Medicare Other

## 2023-11-11 VITALS — BP 151/81 | HR 100 | Ht 64.0 in | Wt 145.0 lb

## 2023-11-11 DIAGNOSIS — Z0000 Encounter for general adult medical examination without abnormal findings: Secondary | ICD-10-CM

## 2023-11-11 NOTE — Progress Notes (Addendum)
 Subjective:   Meredith Anderson is a 83 y.o. who presents for a Medicare Wellness preventive visit.  As a reminder, Annual Wellness Visits don't include a physical exam, and some assessments may be limited, especially if this visit is performed virtually. We may recommend an in-person visit if needed.  Visit Complete: In person  VideoDeclined- This patient declined Interactive audio and Acupuncturist. Therefore the visit was completed with audio only.  Persons Participating in Visit: Patient.  AWV Questionnaire: No: Patient Medicare AWV questionnaire was not completed prior to this visit.  Cardiac Risk Factors include: advanced age (>45men, >55 women);diabetes mellitus;dyslipidemia;hypertension     Objective:     Today's Vitals   11/11/23 1002  BP: (!) 151/81  Pulse: 100  Weight: 145 lb (65.8 kg)  Height: 5\' 4"  (1.626 m)   Body mass index is 24.89 kg/m.     11/07/2022   11:17 AM 05/02/2022    1:19 PM 11/02/2021   10:41 AM 11/01/2020   11:08 AM 10/29/2019    2:46 PM 10/28/2018    2:42 PM 05/23/2016    3:43 PM  Advanced Directives  Does Patient Have a Medical Advance Directive? No No No No No No No  Would patient like information on creating a medical advance directive? No - Patient declined  No - Patient declined No - Patient declined Yes (MAU/Ambulatory/Procedural Areas - Information given) No - Patient declined No - Patient declined    Current Medications (verified) Outpatient Encounter Medications as of 11/11/2023  Medication Sig   acetaminophen  (TYLENOL ) 500 MG tablet Take by mouth.   azelastine  (ASTELIN ) 0.1 % nasal spray PLACE 1 SPRAY INTO BOTH NOSTRILS 2 (TWO) TIMES DAILY.   Bempedoic Acid  (NEXLETOL ) 180 MG TABS Take 1 tablet (180 mg total) by mouth daily. For cholesterol   Blood Glucose Monitoring Suppl DEVI Check BGs daily E11.9. May substitute to any manufacturer covered by patient's insurance.   cholecalciferol (VITAMIN D ) 1000 UNITS tablet Take 1,000 Units  by mouth daily.   diclofenac  Sodium (VOLTAREN ) 1 % GEL APPLY 2 GRAMS TOPICALLY 4 (FOUR) TIMES DAILY. (AS NEEDED FOR JOINT PAIN)   diltiazem  (CARDIZEM  CD) 120 MG 24 hr capsule TAKE 1 CAPSULE BY MOUTH EVERY DAY   Efinaconazole  (JUBLIA ) 10 % SOLN Apply to affected areas daily as directed for nail fungus x48 weeks   ezetimibe  (ZETIA ) 10 MG tablet TAKE 1 TABLET BY MOUTH EVERY DAY   FARXIGA  10 MG TABS tablet TAKE 1 TABLET (10 MG TOTAL) BY MOUTH DAILY BEFORE BREAKFAST. TO REPLACE JARDIANCE    fexofenadine  (ALLEGRA  ALLERGY) 180 MG tablet Take 1 tablet (180 mg total) by mouth daily.   fluticasone  (FLONASE ) 50 MCG/ACT nasal spray Place 2 sprays into both nostrils daily.   furosemide  (LASIX ) 40 MG tablet TAKE 1 TABLET (40 MG TOTAL) BY MOUTH DAILY AS NEEDED FOR FLUID OR EDEMA.   gabapentin  (NEURONTIN ) 100 MG capsule TAKE 2 CAPSULES BY MOUTH 3 TIMES A DAY   glucose blood (ACCU-CHEK AVIVA PLUS) test strip CHECK BLOOD SUGAR ONCE A DAY  Dx E11.9   Glucose Blood (BLOOD GLUCOSE TEST STRIPS) STRP Check BGS daily Ell.9. May substitute to any manufacturer covered by patient's insurance.   hydrocortisone  (ANUSOL -HC) 2.5 % rectal cream PLACE 1 APPLICATION RECTALLY AS NEEDED FOR HEMORRHOID FLARES- MAY USE 7 CONSECUTIVE DAYS   Lancet Device MISC Check BGs daily E11.9 May substitute to any manufacturer covered by patient's insurance.   Lancets Misc. MISC Check BGs daily E11.9 May substitute to any  manufacturer covered by [employer].   levothyroxine  (SYNTHROID ) 125 MCG tablet TAKE 1 TABLET BY MOUTH EVERY DAY BEFORE BREAKFAST   lidocaine  (LIDODERM ) 5 % Place 1 patch onto the skin daily. Remove & Discard patch within 12 hours or as directed by MD   lidocaine  (XYLOCAINE ) 2 % jelly Apply 1 application topically 3 (three) times daily. Apply to hemorrhoid when painful   lisinopril  (ZESTRIL ) 2.5 MG tablet Take 1 tablet (2.5 mg total) by mouth daily.   magnesium oxide (MAG-OX) 400 MG tablet 400 mg.   meclizine  (ANTIVERT )  50 MG tablet Take 1 tablet (50 mg total) by mouth 3 (three) times daily as needed.   pioglitazone  (ACTOS ) 15 MG tablet TAKE 1 TABLET (15 MG TOTAL) BY MOUTH DAILY. FOR SUGAR. TO REPLACE METFORMIN    Potassium Chloride  ER 20 MEQ TBCR TAKE 1 TABLET BY MOUTH EVERY DAY   PREMARIN  vaginal cream USING APPLICATOR PLACE 1 GRAM IN VAGINA EVERY OTHER NIGHT AS INSTRUCTED   TETRAHYDROZOLINE HCL OP Apply to eye. Reported on 01/02/2016   XARELTO  15 MG TABS tablet TAKE 1 TABLET (15 MG TOTAL) BY MOUTH DAILY WITH SUPPER   Semaglutide ,0.25 or 0.5MG /DOS, (OZEMPIC , 0.25 OR 0.5 MG/DOSE,) 2 MG/3ML SOPN Inject 0.25 mg into the skin every 7 (seven) days for 28 days, THEN 0.5 mg every 7 (seven) days. STOP Januvia  when you start. (Patient not taking: Reported on 11/11/2023)   No facility-administered encounter medications on file as of 11/11/2023.    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    Cataract    Depression 12/12/2015   Dyshydrosis    Hemorrhoids    Hyperlipidemia    Hypertension    Menopause    NIDDM (non-insulin dependent diabetes mellitus)    diet controlled    Osteopenia 2016   Syncope 11/08/2015   Thyroid  disease    Past Surgical History:  Procedure Laterality Date   ABDOMINAL HYSTERECTOMY     partial, prolapse   ABLATION     bunionectomy bilateral     EYE SURGERY Bilateral    cataracts   LUMBAR DISC SURGERY     L4 and L5   PACEMAKER INSERTION  12/14/2015   ROTATOR CUFF REPAIR Right    TUBAL LIGATION     Family History  Problem Relation Age of Onset   Stroke Mother    Diabetes Mother    Heart disease Father    Atrial fibrillation Father    Cancer Sister        ovarian / colon   Diabetes Sister    Heart disease Brother    Hyperlipidemia  Brother    Hypertension Brother    Diabetes Brother    Heart attack Brother 70       had to perform emergency surgery   Dementia Brother    Hyperlipidemia Sister    Cancer Sister        liver   Hyperlipidemia Sister    Diabetes Sister        Boarderline DM   Cancer Sister    Diabetes Sister    Diabetes Daughter    Rheum arthritis Daughter    Psoriasis Daughter    Social History   Socioeconomic History   Marital status:  Divorced    Spouse name: Not on file   Number of children: 2   Years of education: 95   Highest education level: High school graduate  Occupational History   Occupation: Retired  Tobacco Use   Smoking status: Never   Smokeless tobacco: Never  Vaping Use   Vaping status: Never Used  Substance and Sexual Activity   Alcohol use: No   Drug use: No   Sexual activity: Not Currently  Other Topics Concern   Not on file  Social History Narrative   Lives alone. Children live nearby   Social Drivers of Health   Financial Resource Strain: Low Risk  (11/11/2023)   Overall Financial Resource Strain (CARDIA)    Difficulty of Paying Living Expenses: Not hard at all  Food Insecurity: No Food Insecurity (11/11/2023)   Hunger Vital Sign    Worried About Running Out of Food in the Last Year: Never true    Ran Out of Food in the Last Year: Never true  Transportation Needs: No Transportation Needs (11/11/2023)   PRAPARE - Administrator, Civil Service (Medical): No    Lack of Transportation (Non-Medical): No  Physical Activity: Insufficiently Active (11/11/2023)   Exercise Vital Sign    Days of Exercise per Week: 7 days    Minutes of Exercise per Session: 10 min  Stress: No Stress Concern Present (11/11/2023)   [employer] of Occupational Health - Occupational Stress Questionnaire    Feeling of Stress : Not at all  Social Connections: Socially Isolated (11/11/2023)   Social Connection and Isolation Panel [NHANES]    Frequency of Communication with  Friends and Family: More than three times a week    Frequency of Social Gatherings with Friends and Family: More than three times a week    Attends Religious Services: Never    Database administrator or Organizations: No    Attends Engineer, structural: Never    Marital Status: Divorced    Tobacco Counseling Counseling given: Yes    Clinical Intake:  Pre-visit preparation completed: Yes  Pain : No/denies pain     BMI - recorded: 24.89 Nutritional Status: BMI of 19-24  Normal Nutritional Risks: None Diabetes: Yes CBG done?: No (170)  Lab Results  Component Value Date   HGBA1C 7.3 (H) 09/25/2023   HGBA1C 7.2 (H) 05/24/2023   HGBA1C 7.0 (H) 02/21/2023     How often do you need to have someone help you when you read instructions, pamphlets, or other written materials from your doctor or pharmacy?: 1 - Never  Interpreter Needed?: No  Information entered by :: Alia t/ma   Activities of Daily Living     11/11/2023   10:09 AM  In your present state of health, do you have any difficulty performing the following activities:  Hearing? 0  Vision? 0  Difficulty concentrating or making decisions? 1  Comment per pt sometimes remembering  Walking or climbing stairs? 0  Dressing or bathing? 0  Doing errands, shopping? 0  Comment pt can drive around her home area but not to [employer] and eating ? N  Using the Toilet? N  In the past six months, have you accidently leaked urine? N  Do you have problems with loss of bowel control? N  Managing your Medications? N  Managing your Finances? N  Housekeeping or managing your Housekeeping? N    Patient Care Team: Eliodoro Guerin, DO as PCP - General (Family  Medicine) Darlene Ehlers, MD as Consulting Physician (Internal Medicine) Abbe Abate, MD as Consulting Physician (Cardiology) Moira Andrews, MD as Consulting Physician (Dermatology) Delilah Fend, [facility] (Pharmacist)  Indicate  any recent Medical Services you may have received from other than [facility] providers in the past year (date may be approximate).     Assessment:    This is a routine wellness examination for Latonda.  Hearing/Vision screen Hearing Screening - Comments:: Pt denies hearing dif Vision Screening - Comments:: pt wear glasses reading. pt goes [facility] [address]   Goals Addressed             This Visit's Progress    DIET - REDUCE SUGAR INTAKE   On track    Exercise 3x per week (30 min per time)   Not on track    11/02/2021 AWV Goal: Exercise for General Health  Patient will verbalize understanding of the benefits of increased physical activity: Exercising regularly is important. It will improve your overall fitness, flexibility, and endurance. Regular exercise also will improve your overall health. It can help you control your weight, reduce stress, and improve your bone density. Over the next year, patient will increase physical activity as tolerated with a goal of at least 150 minutes of moderate physical activity per week.  You can tell that you are exercising at a moderate intensity if your heart starts beating faster and you start breathing faster but can still hold a conversation. Moderate-intensity exercise ideas include: Walking 1 mile (1.6 km) in about 15 minutes Biking Hiking Golfing Dancing Water aerobics Patient will verbalize understanding of everyday activities that increase physical activity by providing examples like the following: Yard work, such as: Insurance underwriter Gardening Washing windows or floors Patient will be able to explain general safety guidelines for exercising:  Before you start a new exercise program, talk with your health care provider. Do not exercise so much that you hurt yourself, feel dizzy, or get very short of breath. Wear comfortable clothes and wear shoes  with good support. Drink plenty of water while you exercise to prevent dehydration or heat stroke. Work out until your breathing and your heartbeat get faster.        Depression Screen     11/11/2023   10:17 AM 09/25/2023    2:53 PM 08/27/2023    3:41 PM 05/24/2023    3:16 PM 03/13/2023    1:30 PM 11/21/2022    3:24 PM 11/07/2022   11:16 AM  PHQ 2/9 Scores  PHQ - 2 Score 0 0 0 0 0 0 0  PHQ- 9 Score 0 0 0 0 0 0 0    Fall Risk     11/11/2023   10:08 AM 09/25/2023    2:53 PM 08/27/2023    3:41 PM 05/24/2023    3:15 PM 03/13/2023    1:15 PM  Fall Risk   Falls in the past year? 0 0 0 0 0  Number falls in past yr: 0 0 0 0 0  Injury with Fall? 0 0 0 0 0  Risk for fall due to : No Fall Risks No Fall Risks No Fall Risks No Fall Risks No Fall Risks  Follow up Falls prevention discussed;Falls evaluation completed Falls evaluation completed Education provided Education provided Education provided    MEDICARE RISK AT HOME:  Medicare Risk at Home Any stairs in or around the home?:  No If so, are there any without handrails?: Yes Home free of loose throw rugs in walkways, pet beds, electrical cords, etc?: Yes Adequate lighting in your home to reduce risk of falls?: Yes Life alert?: No Use of a cane, walker or w/c?: No Grab bars in the bathroom?: No Shower chair or bench in shower?: Yes Elevated toilet seat or a handicapped toilet?: Yes  TIMED UP AND GO:  Was the test performed?  no  Cognitive Function: 6CIT completed    06/28/2017    2:23 PM 05/23/2016    3:46 PM 05/20/2015    2:46 PM  MMSE - Mini Mental State Exam  Orientation to time 5 5 5   Orientation to Place 5 5 5   Registration 3 3 3   Attention/ Calculation 5 3 5   Recall 1 3 3   Language- name 2 objects 2 2 2   Language- repeat 1 1 1   Language- follow 3 step command 3 3 3   Language- read & follow direction 1 1 1   Write a sentence 1 1 1   Copy design 1 1 1   Total score 28 28 30         11/11/2023   10:19 AM 11/07/2022    11:17 AM 11/02/2021   10:42 AM 10/29/2019    2:50 PM 10/28/2018    2:46 PM  6CIT Screen  What Year? 0 points 0 points 0 points 0 points 0 points  What month? 0 points 0 points 0 points 0 points 0 points  What time? 0 points 0 points 0 points 0 points 0 points  Count back from 20 0 points 0 points 0 points 0 points 0 points  Months in reverse 0 points 0 points 0 points 0 points 0 points  Repeat phrase 0 points 0 points 0 points 0 points 2 points  Total Score 0 points 0 points 0 points 0 points 2 points    Immunizations Immunization History  Administered Date(s) Administered   Fluad Quad(high Dose 65+) 03/25/2019, 04/27/2020, 05/22/2022   Fluad Trivalent(High Dose 65+) 03/13/2023   Influenza Whole 04/01/2012   Influenza, High Dose Seasonal PF 04/17/2017, 04/16/2018   Influenza, Seasonal, Injecte, Preservative Fre 03/28/2010, 05/29/2011, 04/03/2012   Influenza,inj,Quad PF,6+ Mos 04/03/2013, 04/03/2016   Influenza-Unspecified 03/15/2014   Moderna Sars-Covid-2 Vaccination 07/21/2019, 08/18/2019, 06/16/2020   Pneumococcal Conjugate-13 05/17/2014   Pneumococcal Polysaccharide-23 08/02/2009   Tdap 01/31/2012, 02/21/2012, 11/21/2022   Zoster Recombinant(Shingrix) 02/28/2021   Zoster, Live 01/31/2012, 02/21/2012    Screening Tests Health Maintenance  Topic Date Due   Zoster Vaccines- Shingrix (2 of 2) 11/24/2023 (Originally 04/25/2021)   COVID-19 Vaccine (4 - 2024-25 season) 11/26/2024 (Originally 03/03/2023)   OPHTHALMOLOGY EXAM  12/19/2023   INFLUENZA VACCINE  01/31/2024   HEMOGLOBIN A1C  03/27/2024   DEXA SCAN  05/22/2024   Diabetic kidney evaluation - eGFR measurement  05/23/2024   FOOT EXAM  05/23/2024   Diabetic kidney evaluation - Urine ACR  09/24/2024   Medicare Annual Wellness (AWV)  11/10/2024   DTaP/Tdap/Td (4 - Td or Tdap) 11/20/2032   Pneumonia Vaccine 69+ Years old  Completed   HPV VACCINES  Aged Out   Meningococcal B Vaccine  Aged Out    Health Maintenance  There  are no preventive care reminders to display for this patient.  Health Maintenance Items Addressed: See Nurse Notes  Additional Screening:  Vision Screening: Recommended annual ophthalmology exams for early detection of glaucoma and other disorders of the eye.  Dental Screening: Recommended annual dental exams for proper  oral hygiene  Community Resource Referral / Chronic Care Management: CRR required this visit?  No   CCM required this visit?  No   Plan:    I have personally reviewed and noted the following in the patient's chart:   Medical and social history Use of alcohol, tobacco or illicit drugs  Current medications and supplements including opioid prescriptions. Patient is not currently taking opioid prescriptions. Functional ability and status Nutritional status Physical activity Advanced directives List of other physicians Hospitalizations, surgeries, and ER visits in previous 12 months Vitals Screenings to include cognitive, depression, and falls Referrals and appointments  In addition, I have reviewed and discussed with patient certain preventive protocols, quality metrics, and best practice recommendations. A written personalized care plan for preventive services as well as general preventive health recommendations were provided to patient.   Michaelle Adolphus, CMA   11/11/2023   After Visit Summary: (Declined) Due to this being a telephonic visit, with patients personalized plan was offered to patient but patient Declined AVS at this time   Notes: Please refer to Routing Comments. Also pt is aware of missing her 2nd shingles vaccine, however, pt stated she got sick after her 1st shot, don't want to take 2nd one.   I have reviewed and agree with the above  documentation.   Tommas Fragmin, FNP

## 2023-11-11 NOTE — Patient Instructions (Signed)
 Ms. Barros , Thank you for taking time out of your busy schedule to complete your Annual Wellness Visit with me. I enjoyed our conversation and look forward to speaking with you again next year. I, as well as your care team,  appreciate your ongoing commitment to your health goals. Please review the following plan we discussed and let me know if I can assist you in the future.   Follow up Visits: Next Medicare AWV with our clinical staff: We., 11/11/24 at 10:00 a.m.   Have you seen your provider in the last 6 months (3 months if uncontrolled diabetes)? Yes Next Office Visit with your provider: 01/10/24 @ 3:45p.m.  Clinician Recommendations:  Aim for 30 minutes of exercise or brisk walking, 6-8 glasses of water, and 5 servings of fruits and vegetables each day. N/a      This is a list of the screening recommended for you and due dates:  Health Maintenance  Topic Date Due   Zoster (Shingles) Vaccine (2 of 2) 11/24/2023*   COVID-19 Vaccine (4 - 2024-25 season) 11/26/2024*   Eye exam for diabetics  12/19/2023   Flu Shot  01/31/2024   Hemoglobin A1C  03/27/2024   DEXA scan (bone density measurement)  05/22/2024   Yearly kidney function blood test for diabetes  05/23/2024   Complete foot exam   05/23/2024   Yearly kidney health urinalysis for diabetes  09/24/2024   Medicare Annual Wellness Visit  11/10/2024   DTaP/Tdap/Td vaccine (4 - Td or Tdap) 11/20/2032   Pneumonia Vaccine  Completed   HPV Vaccine  Aged Out   Meningitis B Vaccine  Aged Out  *Topic was postponed. The date shown is not the original due date.    Advanced directives: (Declined) Advance directive discussed with you today. Even though you declined this today, please call our office should you change your mind, and we can give you the proper paperwork for you to fill out. Advance Care Planning is important because it:  [x]  Makes sure you receive the medical care that is consistent with your values, goals, and preferences  [x]   It provides guidance to your family and loved ones and reduces their decisional burden about whether or not they are making the right decisions based on your wishes.  Follow the link provided in your after visit summary or read over the paperwork we have mailed to you to help you started getting your Advance Directives in place. If you need assistance in completing these, please reach out to us  so that we can help you!  See attachments for Preventive Care and Fall Prevention Tips.

## 2023-11-17 ENCOUNTER — Other Ambulatory Visit: Payer: Self-pay | Admitting: Family Medicine

## 2023-11-17 DIAGNOSIS — E1169 Type 2 diabetes mellitus with other specified complication: Secondary | ICD-10-CM

## 2023-11-17 DIAGNOSIS — G8929 Other chronic pain: Secondary | ICD-10-CM

## 2023-11-17 DIAGNOSIS — E1122 Type 2 diabetes mellitus with diabetic chronic kidney disease: Secondary | ICD-10-CM

## 2023-11-20 ENCOUNTER — Telehealth: Payer: Self-pay

## 2023-11-20 NOTE — Telephone Encounter (Signed)
-----   Message from [facility] sent at 11/18/2023 10:31 AM EDT ----- Yes she needs to resume the Januvia  if she is not on Ozempic . ----- Message ----- From: Michaelle Adolphus, CMA Sent: 11/11/2023  10:30 AM EDT To: Eliodoro Guerin, DO  During awv today, pt stated: pt is no longer taking the Ozempic  it makes her sick, ie vomit. Did you want her to restart taking her Januvia  again? Pls let pt know.   Thank you

## 2023-11-20 NOTE — Telephone Encounter (Signed)
 Attempted to call pt, no answer/VM. Will try again.

## 2023-11-21 ENCOUNTER — Other Ambulatory Visit: Payer: Self-pay | Admitting: Family Medicine

## 2023-11-21 DIAGNOSIS — E1169 Type 2 diabetes mellitus with other specified complication: Secondary | ICD-10-CM

## 2023-11-23 ENCOUNTER — Other Ambulatory Visit: Payer: Self-pay | Admitting: Family Medicine

## 2023-11-23 DIAGNOSIS — E1169 Type 2 diabetes mellitus with other specified complication: Secondary | ICD-10-CM

## 2023-11-26 NOTE — Telephone Encounter (Signed)
 Januvia  refilled as pt couldn't tolerate Ozempic  per previous telephone encounter.

## 2023-11-29 ENCOUNTER — Telehealth: Payer: Self-pay

## 2023-11-29 NOTE — Telephone Encounter (Signed)
 Copied from CRM [id number]. Topic: General - Other >> Nov 29, 2023  2:26 PM [place] wrote: Reason for CRM: pt called in to see if someone can call the Patient . She said she would like the nurse to call her and not her daughter . Patient phone number . [phone]

## 2023-11-29 NOTE — Telephone Encounter (Signed)
-----   Message from [facility] sent at 11/18/2023 10:31 AM EDT ----- Yes she needs to resume the Januvia  if she is not on Ozempic . ----- Message ----- From: Michaelle Adolphus, CMA Sent: 11/11/2023  10:30 AM EDT To: Eliodoro Guerin, DO  During awv today, pt stated: pt is no longer taking the Ozempic  it makes her sick, ie vomit. Did you want her to restart taking her Januvia  again? Pls let pt know.   Thank you

## 2023-11-29 NOTE — Telephone Encounter (Signed)
 Called and spoke w/pt's daughter, per daughter stated that pt is still taking the Januvia  and will let pt know.

## 2023-12-02 NOTE — Telephone Encounter (Signed)
 FOLLOW UP WITH PATIENT AND SHE ADVISES SHE DOES NOT NEED ANYTHING JUST WANTED TO MAKE SURE WE CALL HER AND NOT HER DAUGHTER. VERIFIED THAT WE DO HAVE CORRECT NUMBER.

## 2023-12-11 DIAGNOSIS — I495 Sick sinus syndrome: Secondary | ICD-10-CM | POA: Diagnosis not present

## 2023-12-11 DIAGNOSIS — I480 Paroxysmal atrial fibrillation: Secondary | ICD-10-CM | POA: Diagnosis not present

## 2023-12-11 DIAGNOSIS — I340 Nonrheumatic mitral (valve) insufficiency: Secondary | ICD-10-CM | POA: Diagnosis not present

## 2023-12-11 DIAGNOSIS — I4892 Unspecified atrial flutter: Secondary | ICD-10-CM | POA: Diagnosis not present

## 2023-12-12 ENCOUNTER — Other Ambulatory Visit: Payer: Self-pay | Admitting: Family Medicine

## 2023-12-12 DIAGNOSIS — E034 Atrophy of thyroid (acquired): Secondary | ICD-10-CM

## 2023-12-20 DIAGNOSIS — I495 Sick sinus syndrome: Secondary | ICD-10-CM | POA: Diagnosis not present

## 2023-12-24 ENCOUNTER — Ambulatory Visit (INDEPENDENT_AMBULATORY_CARE_PROVIDER_SITE_OTHER): Admitting: Pharmacist

## 2023-12-24 DIAGNOSIS — I10 Essential (primary) hypertension: Secondary | ICD-10-CM

## 2023-12-24 MED ORDER — POTASSIUM CHLORIDE ER 20 MEQ PO TBCR
1.0000 | EXTENDED_RELEASE_TABLET | Freq: Every day | ORAL | 0 refills | Status: DC
Start: 2023-12-24 — End: 2024-04-13

## 2023-12-24 NOTE — Progress Notes (Signed)
 12/24/2023 Name: Meredith Anderson MRN: [id number] DOB: Dec 26, 1940  Chief Complaint  Patient presents with   Diabetes    Meredith Anderson is a 83 y.o. year old female who was referred for medication management by their primary care provider, Jolinda Potter M, DO. They presented for a face to face visit today.   They were referred to the pharmacist by their PCP for assistance in managing diabetes   Subjective:  Patient presents to clinic today in good spirits.  She uses cane to walk.  She recently tried Ozempic  0.25mg  weekly for 4 weeks, however was unable to tolerate from a GI standpoint.  She has resumed Januvia  along with her other maintenance medications.  Care Team: Primary Care Provider: Jolinda Potter HERO, DO ; Next Scheduled Visit: 01/10/24   Medication Access/Adherence  Current Pharmacy:  [phone] [place] [place] - [address] [place] [address] Phone: [phone] Fax: [phone]  [phone] [place],  - [address] [place] [address] Phone: [phone] Fax: [phone]   Patient reports affordability concerns with their medications: No  Patient reports access/transportation concerns to their pharmacy: No  Patient reports adherence concerns with their medications:  No     Diabetes:  Current medications: Farxiga , Januvia , pioglitazone  Medications tried in the past: glipizide , Ozempic  GFR 54  Current glucose readings: FBG<150 Using BGM/traditional glucometer/not on insulin  Patient denies hypoglycemic s/sx. Patient denies hyperglycemic symptoms  Current meal patterns:  Discussed meal planning options and Plate method for healthy eating Avoid sugary drinks and desserts Incorporate balanced protein, non starchy veggies, 1 serving of carbohydrate with each meal Increase water intake Increase physical activity as able  Current physical activity: encouraged as able  Current  medication access support: LIS/extra help   Objective:  Lab Results  Component Value Date   HGBA1C 7.3 (H) 09/25/2023    Lab Results  Component Value Date   CREATININE 1.04 (H) 05/24/2023   BUN 19 05/24/2023   NA 139 05/24/2023   K 3.8 05/24/2023   CL 99 05/24/2023   CO2 24 05/24/2023    Lab Results  Component Value Date   CHOL 155 02/21/2023   HDL 36 (L) 02/21/2023   LDLCALC 77 02/21/2023   TRIG 256 (H) 02/21/2023   CHOLHDL 4.3 02/21/2023    Medications Reviewed Today     Reviewed by Billee Mliss BIRCH, [facility] (Pharmacist) on 12/24/23 at 1357  Med List Status: <None>   Medication Order Taking? Sig Documenting Provider Last Dose Status Informant  acetaminophen  (TYLENOL ) 500 MG tablet [id number] No Take by mouth. [provider] Taking Active   azelastine  (ASTELIN ) 0.1 % nasal spray [id number] No PLACE 1 SPRAY INTO BOTH NOSTRILS 2 (TWO) TIMES DAILY. Jolinda Potter M, DO Taking Active   Bempedoic Acid  (NEXLETOL ) 180 MG TABS [id number] No Take 1 tablet (180 mg total) by mouth daily. For cholesterol Jolinda Potter HERO, DO Taking Active   Blood Glucose Monitoring Suppl DEVI [id number] No Check BGs daily E11.9. May substitute to any manufacturer covered by patient's insurance. Jolinda Potter M, DO Taking Active   cholecalciferol (VITAMIN D ) 1000 UNITS tablet [id number] No Take 1,000 Units by mouth daily. [provider] Taking Active   diclofenac  Sodium (VOLTAREN ) 1 % GEL [id number] No APPLY 2 GRAMS TOPICALLY 4 (FOUR) TIMES DAILY. (AS NEEDED FOR JOINT PAIN) Jolinda Potter M, DO Taking Active   diltiazem  (CARDIZEM  CD) 120 MG 24 hr capsule  [id number] No TAKE 1 CAPSULE BY MOUTH EVERY DAY Jolinda Potter M, DO Taking Active   Efinaconazole  (JUBLIA ) 10 % SOLN [id number] No Apply to affected areas daily as directed for nail fungus x48 weeks Jolinda Potter M, DO Taking Active   ezetimibe  (ZETIA ) 10 MG tablet [id number] No TAKE 1 TABLET BY MOUTH EVERY DAY Jolinda Potter M, DO Taking Active   FARXIGA  10 MG TABS tablet [id number]  TAKE 1 TABLET (10 MG TOTAL) BY MOUTH DAILY BEFORE BREAKFAST. TO REPLACE JARDIANCE  Jolinda Potter M, DO  Active   fexofenadine  (ALLEGRA  ALLERGY) 180 MG tablet [id number] No Take 1 tablet (180 mg total) by mouth daily. Jolinda Potter M, DO Taking Active   fluticasone  (FLONASE ) 50 MCG/ACT nasal spray [id number] No Place 2 sprays into both nostrils daily. Jolinda Potter M, DO Taking Active   furosemide  (LASIX ) 40 MG tablet [id number] No TAKE 1 TABLET (40 MG TOTAL) BY MOUTH DAILY AS NEEDED FOR FLUID OR EDEMA. Jolinda Potter M, DO Taking Active   gabapentin  (NEURONTIN ) 100 MG capsule [id number]  TAKE 2 CAPSULES BY MOUTH 3 TIMES A DAY Gottschalk, Ashly M, DO  Active   glucose blood (ACCU-CHEK AVIVA PLUS) test strip [id number] No CHECK BLOOD SUGAR ONCE A DAY  Dx E11.9 Jolinda Potter M, DO Taking Active   Glucose Blood (BLOOD GLUCOSE TEST STRIPS) STRP [id number] No Check BGS daily Ell.9. May substitute to any manufacturer covered by patient's insurance. Jolinda Potter M, DO Taking Active   hydrocortisone  (ANUSOL -HC) 2.5 % rectal cream [id number] No PLACE 1 APPLICATION RECTALLY AS NEEDED FOR HEMORRHOID FLARES- MAY USE 7 CONSECUTIVE DAYS Jolinda Potter HERO, DO Taking Active   JANUVIA  100 MG tablet [id number]  TAKE 1 TABLET BY MOUTH EVERY DAY Jolinda Potter HERO ROSALEA  Active   Lancet Device MISC [id number] No Check BGs daily E11.9 May substitute to any manufacturer covered by patient's insurance. Jolinda Potter HERO, DO Taking Active   Lancets Misc. MISC [id number] No Check BGs daily E11.9 May substitute to any manufacturer covered by patient's insurance. Jolinda Potter M, DO Taking Active   levothyroxine  (SYNTHROID ) 125 MCG tablet [id number]  TAKE 1 TABLET BY MOUTH EVERY DAY BEFORE BREAKFAST Jolinda Potter M, DO  Active   lidocaine  (LIDODERM ) 5 % [id number] No Place 1 patch onto the skin daily. Remove & Discard patch within 12 hours or as  directed by MD Jolinda Potter HERO, DO Taking Active   lidocaine  (XYLOCAINE ) 2 % jelly [id number] No Apply 1 application topically 3 (three) times daily. Apply to hemorrhoid when painful Joshua Clayborne RAMAN, PA-C Taking Active   lisinopril  (ZESTRIL ) 2.5 MG tablet [id number] No Take 1 tablet (2.5 mg total) by mouth daily. Jolinda Potter M, DO Taking Active   magnesium oxide (MAG-OX) 400 MG tablet [id number] No 400 mg. [provider] Taking Active            Med Note JUSTINO, [facility]   Thu May 21, 2013  3:36 PM) Received from: [facility]  meclizine  (ANTIVERT ) 50 MG tablet [id number] No Take 1 tablet (50 mg total) by mouth 3 (three) times daily as needed. Lavell Bari LABOR, FNP Taking Active   pioglitazone  (ACTOS ) 15 MG tablet [id number] No TAKE 1 TABLET (15 MG TOTAL) BY MOUTH DAILY. FOR SUGAR. TO REPLACE METFORMIN  Jolinda Potter HERO, DO Taking Active   Potassium Chloride  ER 20 MEQ TBCR [id number] No TAKE 1 TABLET BY MOUTH EVERY DAY Jolinda Potter HERO, DO Taking Active   PREMARIN  vaginal cream [id number] No  USING APPLICATOR PLACE 1 GRAM IN VAGINA EVERY OTHER NIGHT AS INSTRUCTED Jolinda Norene HERO, DO Taking Active   TETRAHYDROZOLINE HCL OP [id number] No Apply to eye. Reported on 01/02/2016 [provider] Taking Active   XARELTO  15 MG TABS tablet [id number]  TAKE 1 TABLET (15 MG TOTAL) BY MOUTH DAILY WITH SUPPER Jolinda Norene M, DO  Active             Assessment/Plan:   Diabetes: - Currently controlled, A1c of 7.3% is controlled given age & benefit vs risk - Reviewed long term cardiovascular and renal outcomes of uncontrolled blood sugar - Reviewed goal A1c, goal fasting, and goal 2 hour post prandial glucose - Recommend to : Start Rybelsus 3mg  daily--counseled on taking it with water only, 30 min before she eats, drinks or takes other medications (30 day sample given to patient) STOP Januvia  Continue all other medications as prescribed  - Patient denies  personal or family history of multiple endocrine neoplasia type 2, medullary thyroid  cancer; personal history of pancreatitis or gallbladder disease. - Recommend to check glucose daily (fasting) or if symptomatic   Follow Up Plan: PCP in July  Mliss Tarry Griffin, PharmD, BCACP, CPP Clinical Pharmacist, [facility]

## 2023-12-26 ENCOUNTER — Telehealth: Payer: Self-pay

## 2023-12-26 NOTE — Progress Notes (Signed)
 Complex Care Management Care Guide Note  12/26/2023 Name: Meredith Anderson MRN: [id number] DOB: 1941-06-28  [name] is a 83 y.o. year old female who is a primary care patient of Jolinda Norene HERO, DO and is actively engaged with the care management team. I reached out to Maceo VEAR Kalata by phone today to assist with scheduling  with the Pharmacist.  Follow up plan: Unsuccessful telephone outreach attempt made. A HIPAA compliant phone message was left for the patient providing contact information and requesting a return call.  Jeoffrey Buffalo , RMA     [facility]  [facility], [facility] Guide  Direct Dial: [phone]  Website: [email or web address]

## 2023-12-31 NOTE — Progress Notes (Signed)
 Complex Care Management Care Guide Note  12/31/2023 Name: JOVIE SWANNER MRN: [id number] DOB: [birth year]  SADIYAH KANGAS is a 83 y.o. year old female who is a primary care patient of Jolinda Norene HERO, DO and is actively engaged with the care management team. I reached out to Maceo VEAR Kalata by phone today to assist with re-scheduling  with the Pharmacist.  Follow up plan: Telephone appointment with complex care management team member scheduled for:  01/08/2024  Jeoffrey Buffalo , RMA     [place]  [facility], [facility] Guide  Direct Dial: [phone]  Website: [email or web address]

## 2024-01-06 ENCOUNTER — Ambulatory Visit

## 2024-01-07 ENCOUNTER — Ambulatory Visit: Admitting: Family Medicine

## 2024-01-08 ENCOUNTER — Other Ambulatory Visit (INDEPENDENT_AMBULATORY_CARE_PROVIDER_SITE_OTHER)

## 2024-01-08 DIAGNOSIS — E119 Type 2 diabetes mellitus without complications: Secondary | ICD-10-CM

## 2024-01-08 DIAGNOSIS — E785 Hyperlipidemia, unspecified: Secondary | ICD-10-CM

## 2024-01-08 DIAGNOSIS — Z7984 Long term (current) use of oral hypoglycemic drugs: Secondary | ICD-10-CM

## 2024-01-08 DIAGNOSIS — E1169 Type 2 diabetes mellitus with other specified complication: Secondary | ICD-10-CM

## 2024-01-08 NOTE — Progress Notes (Signed)
 01/08/2024 Name: Meredith Anderson MRN: [id number] DOB: 03/14/1941  Chief Complaint  Patient presents with   Diabetes   Hyperlipidemia    Meredith Anderson is a 83 y.o. year old female who presented for a telephone visit.   They were referred to the pharmacist by their PCP for assistance in managing diabetes and hyperlipidemia.    Subjective:  Patient reports she currently has a cold, but is feeling better.  She will see PCP on Friday for annual physical.  She was unable to tolerate the Rybelsus 3mg  (tried for 2 weeks).  She has resumed her Januvia  and continued all other medications as prescribed  Care Team: Primary Care Provider: Jolinda Norene HERO, DO ; Next Scheduled Visit: 01/10/24   Medication Access/Adherence  [facility]  [phone] [place] [place] - [address] [place] [address] Phone: [phone] Fax: [phone]  [phone] [place] [place] - [address] [place] [address] Phone: [phone] Fax: [phone]   Patient reports affordability concerns with their medications: No  Patient reports access/transportation concerns to their pharmacy: No  Patient reports adherence concerns with their medications:  No     Diabetes: Current medications: Farxiga , Januvia  Medications tried in the past: glipizide , Ozempic ,  pioglitazone , Rybelsus GFR 54   Current glucose readings: FBG<150, FBG 143 this AM Using BGM/traditional glucometer/not on insulin ([employer] guide)   Patient denies hypoglycemic s/sx. Patient denies hyperglycemic symptoms   Current meal patterns:  Discussed meal planning options and Plate method for healthy eating Avoid sugary drinks and desserts Incorporate balanced protein, non starchy veggies, 1 serving of carbohydrate with each meal Increase water intake Increase physical activity as able   Current physical activity: encouraged as able   Current medication access  support: LIS/extra help   Hyperlipidemia/ASCVD Risk Reduction  Current lipid lowering medications: ezetimibe  Medications tried in the past: nexletol , pravastatin , rosuvastatin  (myopathy), niaspan (rash)  Antiplatelet regimen: n/a  ASCVD History: n/a Family History: n/a Risk Factors: T2DM, HTN  Clinical ASCVD: No  The ASCVD Risk score (Arnett DK, et al., 2019) failed to calculate for the following reasons:   The 2019 ASCVD risk score is only valid for ages 49 to 99   Objective:  Lab Results  Component Value Date   HGBA1C 7.3 (H) 09/25/2023    Lab Results  Component Value Date   CREATININE 1.04 (H) 05/24/2023   BUN 19 05/24/2023   NA 139 05/24/2023   K 3.8 05/24/2023   CL 99 05/24/2023   CO2 24 05/24/2023    Lab Results  Component Value Date   CHOL 155 02/21/2023   HDL 36 (L) 02/21/2023   LDLCALC 77 02/21/2023   TRIG 256 (H) 02/21/2023   CHOLHDL 4.3 02/21/2023    Medications Reviewed Today     Reviewed by Billee Mliss BIRCH, [facility] (Pharmacist) on 01/08/24 at 0950  Med List Status: <None>   Medication Order Taking? Sig Documenting Provider Last Dose Status Informant  acetaminophen  (TYLENOL ) 500 MG tablet [id number]  Take by mouth. [provider]  Active   azelastine  (ASTELIN ) 0.1 % nasal spray [id number]  PLACE 1 SPRAY INTO BOTH NOSTRILS 2 (TWO) TIMES DAILY. Jolinda Norene M, DO  Active   Bempedoic Acid  (NEXLETOL ) 180 MG TABS [id number]  Take 1 tablet (180 mg total) by mouth daily. For cholesterol Jolinda Norene M, DO  Consider Medication Status and Discontinue (Change in therapy)   Blood Glucose Monitoring  Suppl DEVI [id number]  Check BGs daily E11.9. May substitute to any manufacturer covered by patient's insurance. Jolinda Potter M, DO  Active   cholecalciferol (VITAMIN D ) 1000 UNITS tablet [id number]  Take 1,000 Units by mouth daily. [provider]  Active   diclofenac  Sodium (VOLTAREN ) 1 % GEL [id number]  APPLY 2 GRAMS TOPICALLY 4 (FOUR) TIMES  DAILY. (AS NEEDED FOR JOINT PAIN) Jolinda Potter M, DO  Active   diltiazem  (CARDIZEM  CD) 120 MG 24 hr capsule [id number]  TAKE 1 CAPSULE BY MOUTH EVERY DAY Gottschalk, Ashly M, DO  Active   Efinaconazole  (JUBLIA ) 10 % SOLN [id number]  Apply to affected areas daily as directed for nail fungus x48 weeks Jolinda Potter M, DO  Active   ezetimibe  (ZETIA ) 10 MG tablet [id number]  TAKE 1 TABLET BY MOUTH EVERY DAY Gottschalk, Ashly M, DO  Active   FARXIGA  10 MG TABS tablet [id number]  TAKE 1 TABLET (10 MG TOTAL) BY MOUTH DAILY BEFORE BREAKFAST. TO REPLACE JARDIANCE  Jolinda Potter M, DO  Active   fexofenadine  (ALLEGRA  ALLERGY) 180 MG tablet [id number]  Take 1 tablet (180 mg total) by mouth daily. Jolinda Potter M, DO  Active   fluticasone  (FLONASE ) 50 MCG/ACT nasal spray [id number]  Place 2 sprays into both nostrils daily. Jolinda Potter M, DO  Active   furosemide  (LASIX ) 40 MG tablet [id number]  TAKE 1 TABLET (40 MG TOTAL) BY MOUTH DAILY AS NEEDED FOR FLUID OR EDEMA. Jolinda Potter M, DO  Active   gabapentin  (NEURONTIN ) 100 MG capsule [id number]  TAKE 2 CAPSULES BY MOUTH 3 TIMES A DAY Gottschalk, Ashly M, DO  Active   glucose blood (ACCU-CHEK AVIVA PLUS) test strip [id number]  CHECK BLOOD SUGAR ONCE A DAY  Dx E11.9 Jolinda Potter M, DO  Active   Glucose Blood (BLOOD GLUCOSE TEST STRIPS) STRP [id number]  Check BGS daily Ell.9. May substitute to any manufacturer covered by patient's insurance. Jolinda Potter M, DO  Active   hydrocortisone  (ANUSOL -HC) 2.5 % rectal cream [id number]  PLACE 1 APPLICATION RECTALLY AS NEEDED FOR HEMORRHOID FLARES- MAY USE 7 CONSECUTIVE DAYS Jolinda Potter HERO, DO  Active   JANUVIA  100 MG tablet [id number]  TAKE 1 TABLET BY MOUTH EVERY DAY Jolinda Potter HERO ROSALEA  Active   Lancet Device MISC [id number]  Check BGs daily E11.9 May substitute to any manufacturer covered by patient's insurance. Jolinda Potter M, DO  Active   Lancets Misc. MISC [id number]  Check BGs daily  E11.9 May substitute to any manufacturer covered by patient's insurance. Jolinda Potter M, DO  Active   levothyroxine  (SYNTHROID ) 125 MCG tablet [id number]  TAKE 1 TABLET BY MOUTH EVERY DAY BEFORE BREAKFAST Jolinda Potter M, DO  Active   lidocaine  (LIDODERM ) 5 % [id number]  Place 1 patch onto the skin daily. Remove & Discard patch within 12 hours or as directed by MD Jolinda Potter HERO, DO  Active   lidocaine  (XYLOCAINE ) 2 % jelly [id number]  Apply 1 application topically 3 (three) times daily. Apply to hemorrhoid when painful Joshua Clayborne RAMAN, PA-C  Active   lisinopril  (ZESTRIL ) 2.5 MG tablet [id number]  Take 1 tablet (2.5 mg total) by mouth daily. Jolinda Potter M, DO  Active   magnesium oxide (MAG-OX) 400 MG tablet [id number]  400 mg. [provider]  Active            Med Note JUSTINO, [facility]   Thu May 21, 2013  3:36 PM) Received from: [facility]  meclizine  (ANTIVERT ) 50  MG tablet [id number]  Take 1 tablet (50 mg total) by mouth 3 (three) times daily as needed. Lavell Lye A, FNP  Active   pioglitazone  (ACTOS ) 15 MG tablet [id number]  TAKE 1 TABLET (15 MG TOTAL) BY MOUTH DAILY. FOR SUGAR. TO REPLACE METFORMIN  Jolinda Norene HERO, DO  Active   Potassium Chloride  ER 20 MEQ TBCR [id number]  Take 1 tablet (20 mEq total) by mouth daily. Jolinda Norene M, DO  Active   PREMARIN  vaginal cream [id number]  USING APPLICATOR PLACE 1 GRAM IN VAGINA EVERY OTHER NIGHT AS INSTRUCTED Jolinda Norene HERO, DO  Active   TETRAHYDROZOLINE HCL OP [id number]  Apply to eye. Reported on 01/02/2016 [provider]  Active   XARELTO  15 MG TABS tablet [id number]  TAKE 1 TABLET (15 MG TOTAL) BY MOUTH DAILY WITH SUPPER Jolinda Norene M, DO  Active               Assessment/Plan:   Diabetes: - Currently controlled at A1c of 7.3% given age; will recheck A1c on 01/10/24 - Reviewed long term cardiovascular and renal outcomes of uncontrolled blood sugar - Reviewed goal A1c, goal  fasting, and goal 2 hour post prandial glucose - Recommend to continue Januvia  and Farxiga   Unable to take GLP1s or other therapies at this time  - Patient denies personal or family history of multiple endocrine neoplasia type 2, medullary thyroid  cancer; personal history of pancreatitis or gallbladder disease. - Recommend to check glucose daily (fasting) or if symptomatic   Hyperlipidemia/ASCVD Risk Reduction: - Currently controlled. Controlled on ezetimibe  will recheck labs on Friday and optimize medications as needed; consider nexletol  is additional LDL lowering needed (statin myopathy) - Reviewed long term complications of uncontrolled cholesterol - Reviewed dietary recommendations including FOLLOWING A HEART HEALTHY DIET/HEALTHY PLATE METHOD - Recommend to continue ezetimibe ; f/u labs and plan   Follow Up Plan: 1 month, PCP 01/10/24  Mliss Tarry Griffin, PharmD, BCACP, CPP Clinical Pharmacist, [facility]

## 2024-01-10 ENCOUNTER — Ambulatory Visit (INDEPENDENT_AMBULATORY_CARE_PROVIDER_SITE_OTHER)

## 2024-01-10 ENCOUNTER — Ambulatory Visit (INDEPENDENT_AMBULATORY_CARE_PROVIDER_SITE_OTHER): Admitting: Family Medicine

## 2024-01-10 ENCOUNTER — Encounter: Payer: Self-pay | Admitting: Family Medicine

## 2024-01-10 ENCOUNTER — Ambulatory Visit: Payer: Self-pay | Admitting: Family Medicine

## 2024-01-10 VITALS — BP 138/68 | HR 60 | Temp 98.3°F | Ht 64.0 in | Wt 150.8 lb

## 2024-01-10 DIAGNOSIS — E119 Type 2 diabetes mellitus without complications: Secondary | ICD-10-CM | POA: Diagnosis not present

## 2024-01-10 DIAGNOSIS — E1159 Type 2 diabetes mellitus with other circulatory complications: Secondary | ICD-10-CM | POA: Diagnosis not present

## 2024-01-10 DIAGNOSIS — E785 Hyperlipidemia, unspecified: Secondary | ICD-10-CM

## 2024-01-10 DIAGNOSIS — R002 Palpitations: Secondary | ICD-10-CM

## 2024-01-10 DIAGNOSIS — E1169 Type 2 diabetes mellitus with other specified complication: Secondary | ICD-10-CM | POA: Diagnosis not present

## 2024-01-10 DIAGNOSIS — N182 Chronic kidney disease, stage 2 (mild): Secondary | ICD-10-CM

## 2024-01-10 DIAGNOSIS — I152 Hypertension secondary to endocrine disorders: Secondary | ICD-10-CM | POA: Diagnosis not present

## 2024-01-10 DIAGNOSIS — Z7984 Long term (current) use of oral hypoglycemic drugs: Secondary | ICD-10-CM

## 2024-01-10 DIAGNOSIS — I495 Sick sinus syndrome: Secondary | ICD-10-CM

## 2024-01-10 LAB — BAYER DCA HB A1C WAIVED: HB A1C (BAYER DCA - WAIVED): 7 % — ABNORMAL HIGH (ref 4.8–5.6)

## 2024-01-10 LAB — HM DIABETES EYE EXAM

## 2024-01-10 NOTE — Progress Notes (Unsigned)
 Arrived on 01/10/2024 and has given verbal consent to obtain images and complete their overdue diabetic retinal screening.  The images have been sent to an ophthalmologist or optometrist for review and interpretation.  Results will be sent back to [facility] for review.  Patient has been informed they will be contacted when we receive the results via telephone or MyChart.

## 2024-01-10 NOTE — Progress Notes (Signed)
 Subjective: CC:DM PCP: Jolinda Norene HERO, DO Meredith Anderson is a 83 y.o. female presenting to clinic today for:  1. Type 2 Diabetes with hypertension, hyperlipidemia w/ ckd2:  Patient is compliant with all medications.  She has been holding her Farxiga  for the last 2 days because she had an episode last night where she awakened from a nap flush and her heart was pounding and slightly racing.  EMS did come out to the home and did EKG which showed paced rhythm with heart rate at 70, blood sugar 126 and blood pressure 170/79.  She denies any changes in her normal activities, foods, meds etc.  Diabetes Health Maintenance Due  Topic Date Due   OPHTHALMOLOGY EXAM  12/19/2023   HEMOGLOBIN A1C  03/27/2024   FOOT EXAM  05/23/2024    Last A1c:  Lab Results  Component Value Date   HGBA1C 7.3 (H) 09/25/2023    ROS: She is not having any chest pain, shortness of breath, dizziness currently  ROS: Per HPI  Allergies  Allergen Reactions   Dofetilide Other (See Comments)    Passed out   Doxycycline      diarrhea    Ozempic  (0.25 Or 0.5 Mg-Dose) [Semaglutide (0.25 Or 0.5mg -Dos)] Other (See Comments)    Not tolerated   Amlodipine      Other reaction(s): Other (See Comments) Swollen ankles   Dronedarone Other (See Comments)    Increased BG   Glipizide -Metformin  Hcl     Other reaction(s): Diarrhea, Other (See Comments) Weakness   Metformin      Other reaction(s): Diarrhea   Niaspan [Niacin] Rash   Past Medical History:  Diagnosis Date   A-fib (HCC)    Allergy    seasonal   Anxiety    Arthritis    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 May substitute to any manufacturer covered by patient's insurance., Disp: 1 each, Rfl: 0   cholecalciferol (VITAMIN D ) 1000 UNITS tablet, Take 1,000 Units by mouth daily., Disp: , Rfl:    diclofenac  Sodium (VOLTAREN ) 1 % GEL, APPLY 2 GRAMS TOPICALLY 4 (FOUR) TIMES DAILY. (AS NEEDED FOR JOINT PAIN), Disp: 400 g, Rfl: 3   diltiazem  (CARDIZEM  CD) 120 MG 24 hr capsule, TAKE 1 CAPSULE BY MOUTH EVERY DAY, Disp: 90 capsule, Rfl: 1   Efinaconazole  (JUBLIA ) 10 % SOLN, Apply to affected areas daily as directed for nail fungus x48 weeks, Disp: 8 mL, Rfl: 3   ezetimibe  (ZETIA ) 10 MG tablet, TAKE 1 TABLET BY MOUTH EVERY DAY, Disp: 90 tablet, Rfl: 0   FARXIGA  10 MG TABS tablet, TAKE 1 TABLET (10 MG TOTAL) BY MOUTH DAILY BEFORE BREAKFAST. TO REPLACE JARDIANCE , Disp: 90 tablet, Rfl: 0   fluticasone  (FLONASE ) 50 MCG/ACT nasal spray, Place 2 sprays into both nostrils daily., Disp: 48 mL, Rfl: 1   furosemide  (LASIX ) 40 MG tablet, TAKE 1 TABLET (40 MG TOTAL) BY MOUTH DAILY AS NEEDED FOR FLUID OR EDEMA., Disp: 90 tablet, Rfl: 0  gabapentin  (NEURONTIN ) 100 MG capsule, TAKE 2 CAPSULES BY MOUTH 3 TIMES A DAY, Disp: 180 capsule, Rfl: 0   glucose blood (ACCU-CHEK AVIVA PLUS) test strip, CHECK BLOOD SUGAR ONCE A DAY  Dx E11.9, Disp: 100 strip, Rfl: 3   Glucose Blood (BLOOD GLUCOSE TEST STRIPS) STRP, Check BGS daily Ell.9. May substitute to any manufacturer covered by patient's insurance., Disp: 100 strip, Rfl: 4   hydrocortisone  (ANUSOL -HC) 2.5 % rectal cream, PLACE 1 APPLICATION RECTALLY AS NEEDED FOR HEMORRHOID FLARES- MAY USE 7 CONSECUTIVE DAYS, Disp: 30 g, Rfl: 1   JANUVIA  100 MG tablet, TAKE 1 TABLET BY MOUTH EVERY DAY, Disp: 90 tablet, Rfl: 0   Lancet Device MISC, Check BGs daily E11.9 May substitute to any manufacturer covered by patient's insurance., Disp: 1 each, Rfl: 0   Lancets Misc. MISC, Check BGs daily E11.9 May substitute to any  manufacturer covered by patient's insurance., Disp: 100 each, Rfl: 4   levothyroxine  (SYNTHROID ) 125 MCG tablet, TAKE 1 TABLET BY MOUTH EVERY DAY BEFORE BREAKFAST, Disp: 90 tablet, Rfl: 0   lidocaine  (LIDODERM ) 5 %, Place 1 patch onto the skin daily. Remove & Discard patch within 12 hours or as directed by MD, Disp: 30 patch, Rfl: 3   lidocaine  (XYLOCAINE ) 2 % jelly, Apply 1 application topically 3 (three) times daily. Apply to hemorrhoid when painful, Disp: 30 mL, Rfl: 1   lisinopril  (ZESTRIL ) 2.5 MG tablet, Take 1 tablet (2.5 mg total) by mouth daily., Disp: 100 tablet, Rfl: 3   magnesium oxide (MAG-OX) 400 MG tablet, 400 mg., Disp: , Rfl:    Potassium Chloride  ER 20 MEQ TBCR, Take 1 tablet (20 mEq total) by mouth daily., Disp: 90 tablet, Rfl: 0   PREMARIN  vaginal cream, USING APPLICATOR PLACE 1 GRAM IN VAGINA EVERY OTHER NIGHT AS INSTRUCTED, Disp: 30 g, Rfl: 3   TETRAHYDROZOLINE HCL OP, Apply to eye. Reported on 01/02/2016, Disp: , Rfl:    XARELTO  15 MG TABS tablet, TAKE 1 TABLET (15 MG TOTAL) BY MOUTH DAILY WITH SUPPER, Disp: 90 tablet, Rfl: 0 Social History   Socioeconomic History   Marital status: Divorced    Spouse name: Not on file   Number of children: 2   Years of education: 12   Highest education level: High school graduate  Occupational History   Occupation: Retired  Tobacco Use   Smoking status: Never   Smokeless tobacco: Never  Vaping Use   Vaping status: Never Used  Substance and Sexual Activity   Alcohol use: No   Drug use: No   Sexual activity: Not Currently  Other Topics Concern   Not on file  Social History Narrative   Lives alone. Children live nearby   Social Drivers of Health   Financial Resource Strain: Low Risk  (11/11/2023)   Overall Financial Resource Strain (CARDIA)    Difficulty of Paying Living Expenses: Not hard at all  Food Insecurity: Low Risk  (12/11/2023)   Received from [facility]   Hunger Vital Sign    Within the past 12 months, you worried  that your food would run out before you got money to buy more: Never true    Within the past 12 months, the food you bought just didn't last and you didn't have money to get more. : Never true  Transportation Needs: No Transportation Needs (12/11/2023)   Received from [facility]    In the past 12 months, has lack of reliable transportation kept you from medical  appointments, meetings, work or from getting things needed for daily living? : No  Physical Activity: Insufficiently Active (11/11/2023)   Exercise Vital Sign    Days of Exercise per Week: 7 days    Minutes of Exercise per Session: 10 min  Stress: No Stress Concern Present (11/11/2023)   [employer] of Occupational Health - Occupational Stress Questionnaire    Feeling of Stress : Not at all  Social Connections: Socially Isolated (11/11/2023)   Social Connection and Isolation Panel    Frequency of Communication with Friends and Family: More than three times a week    Frequency of Social Gatherings with Friends and Family: More than three times a week    Attends Religious Services: Never    Database administrator or Organizations: No    Attends Banker Meetings: Never    Marital Status: Divorced  Catering manager Violence: Not At Risk (11/11/2023)   Humiliation, Afraid, Rape, and Kick questionnaire    Fear of Current or Ex-Partner: No    Emotionally Abused: No    Physically Abused: No    Sexually Abused: No   Family History  Problem Relation Age of Onset   Stroke Mother    Diabetes Mother    Heart disease Father    Atrial fibrillation Father    Cancer Sister        ovarian / colon   Diabetes Sister    Heart disease Brother    Hyperlipidemia Brother    Hypertension Brother    Diabetes Brother    Heart attack Brother 26       had to perform emergency surgery   Dementia Brother    Hyperlipidemia Sister    Cancer Sister        liver   Hyperlipidemia Sister    Diabetes Sister         Boarderline DM   Cancer Sister    Diabetes Sister    Diabetes Daughter    Rheum arthritis Daughter    Psoriasis Daughter     Objective: Office vital signs reviewed. BP 138/68   Pulse 60   Temp 98.3 F (36.8 C) (Temporal)   Ht 5' 4 (1.626 m)   Wt 150 lb 12.8 oz (68.4 kg)   SpO2 98%   BMI 25.88 kg/m   Physical Examination:  General: Awake, alert, nontoxic female, No acute distress HEENT: sclera white, MMM Cardio: regular rate and rhythm, S1S2 heard, no murmurs appreciated Pulm: clear to auscultation bilaterally, no wheezes, rhonchi or rales; normal work of breathing on room air    Assessment/ Plan: 83 y.o. female   Diabetes mellitus treated with oral medication (HCC) - Plan: Bayer DCA Hb A1c Waived  Hyperlipidemia associated with type 2 diabetes mellitus (HCC) - Plan: CMP14+EGFR  Hypertension associated with diabetes (HCC) - Plan: CMP14+EGFR  Stage 2 chronic kidney disease - Plan: CMP14+EGFR, CBC  SSS (sick sinus syndrome) (HCC) - Plan: CMP14+EGFR, CBC, TSH + free T4, Magnesium  Heart palpitations - Plan: CMP14+EGFR, CBC, TSH + free T4, Magnesium  Patient has controlled blood sugar with A1c of 7.0.  She did have a recent change in manufacturer of thyroid  meds some going to check TSH and free T4 to see if maybe this is contributing.  Check magnesium, electrolytes, kidney function and liver enzyme.  Also eval for possible anemia contributing  She will continue all medications as prescribed for now.  Okay to resume use of Farxiga .  I am going to CC her cardiologist  as FYI given known sick sinus syndrome with pacemaker in place.  There is apparently plans for replacement of a battery soon of her pacemaker   Jurgen Groeneveld CHRISTELLA Fielding, DO [facility] [phone]

## 2024-01-11 LAB — CMP14+EGFR
ALT: 9 IU/L (ref 0–32)
AST: 15 IU/L (ref 0–40)
Albumin: 4.7 g/dL (ref 3.7–4.7)
Alkaline Phosphatase: 129 IU/L — ABNORMAL HIGH (ref 44–121)
BUN/Creatinine Ratio: 15 (ref 12–28)
BUN: 14 mg/dL (ref 8–27)
Bilirubin Total: 0.4 mg/dL (ref 0.0–1.2)
CO2: 22 mmol/L (ref 20–29)
Calcium: 10.2 mg/dL (ref 8.7–10.3)
Chloride: 99 mmol/L (ref 96–106)
Creatinine, Ser: 0.91 mg/dL (ref 0.57–1.00)
Globulin, Total: 2.8 g/dL (ref 1.5–4.5)
Glucose: 133 mg/dL — ABNORMAL HIGH (ref 70–99)
Potassium: 4.1 mmol/L (ref 3.5–5.2)
Sodium: 139 mmol/L (ref 134–144)
Total Protein: 7.5 g/dL (ref 6.0–8.5)
eGFR: 63 mL/min/1.73 (ref >59)

## 2024-01-11 LAB — TSH+FREE T4
Free T4: 1.43 ng/dL (ref 0.82–1.77)
TSH: 2.72 u[IU]/mL (ref 0.450–4.500)

## 2024-01-11 LAB — MAGNESIUM: Magnesium: 2.1 mg/dL (ref 1.6–2.3)

## 2024-01-11 LAB — CBC
Hematocrit: 43.7 % (ref 34.0–46.6)
Hemoglobin: 14 g/dL (ref 11.1–15.9)
MCH: 30.8 pg (ref 26.6–33.0)
MCHC: 32 g/dL (ref 31.5–35.7)
MCV: 96 fL (ref 79–97)
Platelets: 182 x10E3/uL (ref 150–450)
RBC: 4.54 x10E6/uL (ref 3.77–5.28)
RDW: 12.4 % (ref 11.7–15.4)
WBC: 4.5 x10E3/uL (ref 3.4–10.8)

## 2024-01-16 ENCOUNTER — Encounter: Payer: Self-pay | Admitting: *Deleted

## 2024-01-21 DIAGNOSIS — Z950 Presence of cardiac pacemaker: Secondary | ICD-10-CM | POA: Diagnosis not present

## 2024-01-22 ENCOUNTER — Other Ambulatory Visit: Payer: Self-pay | Admitting: Family Medicine

## 2024-01-22 ENCOUNTER — Ambulatory Visit: Payer: Self-pay | Admitting: Family Medicine

## 2024-01-22 DIAGNOSIS — E1159 Type 2 diabetes mellitus with other circulatory complications: Secondary | ICD-10-CM

## 2024-02-23 ENCOUNTER — Other Ambulatory Visit: Payer: Self-pay | Admitting: Family Medicine

## 2024-02-23 DIAGNOSIS — M609 Myositis, unspecified: Secondary | ICD-10-CM

## 2024-02-23 DIAGNOSIS — E1169 Type 2 diabetes mellitus with other specified complication: Secondary | ICD-10-CM

## 2024-02-27 ENCOUNTER — Other Ambulatory Visit: Payer: Self-pay | Admitting: Family Medicine

## 2024-02-27 DIAGNOSIS — G8929 Other chronic pain: Secondary | ICD-10-CM

## 2024-02-27 DIAGNOSIS — E1169 Type 2 diabetes mellitus with other specified complication: Secondary | ICD-10-CM

## 2024-03-16 ENCOUNTER — Other Ambulatory Visit: Payer: Self-pay | Admitting: Family Medicine

## 2024-03-16 DIAGNOSIS — E034 Atrophy of thyroid (acquired): Secondary | ICD-10-CM

## 2024-03-16 DIAGNOSIS — N1831 Chronic kidney disease, stage 3a: Secondary | ICD-10-CM

## 2024-03-17 ENCOUNTER — Other Ambulatory Visit: Payer: Self-pay | Admitting: Family Medicine

## 2024-03-17 NOTE — Telephone Encounter (Signed)
 Appt scheduled for 06/15/2024

## 2024-03-17 NOTE — Telephone Encounter (Signed)
 December would be great.

## 2024-03-17 NOTE — Telephone Encounter (Signed)
 Please make FU in Dec w/ Dr. KANDICE RFs sent to pharmacy

## 2024-03-24 DIAGNOSIS — I480 Paroxysmal atrial fibrillation: Secondary | ICD-10-CM | POA: Diagnosis not present

## 2024-03-24 DIAGNOSIS — E782 Mixed hyperlipidemia: Secondary | ICD-10-CM | POA: Diagnosis not present

## 2024-04-02 ENCOUNTER — Other Ambulatory Visit: Payer: Self-pay | Admitting: Family Medicine

## 2024-04-02 DIAGNOSIS — B351 Tinea unguium: Secondary | ICD-10-CM

## 2024-04-02 DIAGNOSIS — G8929 Other chronic pain: Secondary | ICD-10-CM

## 2024-04-02 DIAGNOSIS — E1169 Type 2 diabetes mellitus with other specified complication: Secondary | ICD-10-CM

## 2024-04-11 ENCOUNTER — Other Ambulatory Visit: Payer: Self-pay | Admitting: Family Medicine

## 2024-04-11 DIAGNOSIS — I10 Essential (primary) hypertension: Secondary | ICD-10-CM

## 2024-04-13 DIAGNOSIS — I10 Essential (primary) hypertension: Secondary | ICD-10-CM | POA: Diagnosis not present

## 2024-04-13 DIAGNOSIS — I272 Pulmonary hypertension, unspecified: Secondary | ICD-10-CM | POA: Diagnosis not present

## 2024-04-13 DIAGNOSIS — R55 Syncope and collapse: Secondary | ICD-10-CM | POA: Diagnosis not present

## 2024-04-13 DIAGNOSIS — G992 Myelopathy in diseases classified elsewhere: Secondary | ICD-10-CM | POA: Diagnosis not present

## 2024-04-13 DIAGNOSIS — I495 Sick sinus syndrome: Secondary | ICD-10-CM | POA: Diagnosis not present

## 2024-04-13 DIAGNOSIS — Z950 Presence of cardiac pacemaker: Secondary | ICD-10-CM | POA: Diagnosis not present

## 2024-04-13 DIAGNOSIS — I455 Other specified heart block: Secondary | ICD-10-CM | POA: Diagnosis not present

## 2024-04-13 DIAGNOSIS — E119 Type 2 diabetes mellitus without complications: Secondary | ICD-10-CM | POA: Diagnosis not present

## 2024-04-13 DIAGNOSIS — M4802 Spinal stenosis, cervical region: Secondary | ICD-10-CM | POA: Diagnosis not present

## 2024-04-13 DIAGNOSIS — Z9889 Other specified postprocedural states: Secondary | ICD-10-CM | POA: Diagnosis not present

## 2024-04-13 DIAGNOSIS — E039 Hypothyroidism, unspecified: Secondary | ICD-10-CM | POA: Diagnosis not present

## 2024-04-21 DIAGNOSIS — Z950 Presence of cardiac pacemaker: Secondary | ICD-10-CM | POA: Diagnosis not present

## 2024-04-25 ENCOUNTER — Other Ambulatory Visit: Payer: Self-pay | Admitting: Family Medicine

## 2024-04-25 DIAGNOSIS — I152 Hypertension secondary to endocrine disorders: Secondary | ICD-10-CM

## 2024-05-01 ENCOUNTER — Ambulatory Visit: Payer: Self-pay

## 2024-05-01 NOTE — Telephone Encounter (Signed)
 noted

## 2024-05-01 NOTE — Telephone Encounter (Signed)
 FYI Only or Action Required?: FYI only for provider: appointment scheduled on 05/05/2024 at 2:40 PM.  Patient was last seen in primary care on 01/10/2024 by Jolinda Norene HERO, DO.  Called Nurse Triage reporting Hand Pain.  Symptoms began several months ago.  Interventions attempted: Rest, hydration, or home remedies.  Symptoms are: unchanged.  Triage Disposition: See PCP Within 2 Weeks  Patient/caregiver understands and will follow disposition?: Yes  Copied from CRM [id number]. Topic: Clinical - Red Word Triage >> May 01, 2024  4:20 PM Delon HERO wrote: Red Word that prompted transfer to Nurse Triage: Patient is calling to pain in the right hand for 6 months. With swelling for a month in the knuckles and palm. Patient can use the hand Reason for Disposition  [1] MILD pain (e.g., does not interfere with normal activities) AND [2] present > 7 days  Answer Assessment - Initial Assessment Questions 1. ONSET: When did the pain start?     Started six months ago but increased recently 2. LOCATION: Where is the pain located?     Right hand 3. PAIN: How bad is the pain? (Scale 1-10; or mild, moderate, severe)     3 4. WORK OR EXERCISE: Has there been any recent work or exercise that involved this part (i.e., hand or wrist) of the body?     no 5. CAUSE: What do you think is causing the pain?     Patient is thinking this could be arthritis 6. AGGRAVATING FACTORS: What makes the pain worse? (e.g., using computer)     no 7. OTHER SYMPTOMS: Do you have any other symptoms? (e.g., fever, neck pain, numbness or tingling, rash, swelling)     Swelling, tingling  Protocols used: Hand Pain-A-AH

## 2024-05-05 ENCOUNTER — Encounter: Payer: Self-pay | Admitting: Family Medicine

## 2024-05-05 ENCOUNTER — Ambulatory Visit: Admitting: Family

## 2024-05-05 VITALS — BP 147/68 | HR 65 | Temp 97.7°F | Ht 64.0 in | Wt 153.1 lb

## 2024-05-05 DIAGNOSIS — M25541 Pain in joints of right hand: Secondary | ICD-10-CM | POA: Diagnosis not present

## 2024-05-05 DIAGNOSIS — Z23 Encounter for immunization: Secondary | ICD-10-CM | POA: Diagnosis not present

## 2024-05-05 DIAGNOSIS — N1831 Chronic kidney disease, stage 3a: Secondary | ICD-10-CM

## 2024-05-05 DIAGNOSIS — M25542 Pain in joints of left hand: Secondary | ICD-10-CM | POA: Diagnosis not present

## 2024-05-05 DIAGNOSIS — M2550 Pain in unspecified joint: Secondary | ICD-10-CM

## 2024-05-05 DIAGNOSIS — Z7901 Long term (current) use of anticoagulants: Secondary | ICD-10-CM | POA: Diagnosis not present

## 2024-05-05 MED ORDER — HYDROCODONE-ACETAMINOPHEN 5-325 MG PO TABS: 0.5000 | ORAL_TABLET | Freq: Three times a day (TID) | ORAL | 0 refills | Status: AC | PRN

## 2024-05-05 NOTE — Patient Instructions (Signed)
 I am checking you for gout and other reasons why you might have joint swelling but I think this is osteoarthritis which is run-of-the-mill arthritis you get as you get older.   Because you have kidney disease and because you are on a blood thinner you cannot take things like ibuprofen or Aleve.   Because you are not taking Tylenol  and using joint cream and nothing is really keeping the pain and swelling down I have given you a prescription for hydrocodone .   Use this very sparingly and preferably not more than 1/day.   Make sure that you take a stool softener like Colace when you do take this medicine so you do not get constipated.   You may not drive while taking this medication.  It may cause dizziness and sleepiness.

## 2024-05-05 NOTE — Progress Notes (Signed)
 Subjective: CC: Hand pain PCP: Jolinda Norene HERO, DO YEP:Meredith Anderson is a 83 y.o. female presenting to clinic today for:  Patient reports over 1 month history of bilateral hand pain, right greater than left.  She reports associated joint swelling at the MCP joints of the entire right hand.  She utilizes Tylenol  typically every 4 hours and also utilizes topical Voltaren  gel but this does not provide adequate relief.  She is anticoagulated and has history of CKD 3A so does not take oral NSAIDs.  She notes tramadol  has not agreed with her in the past.  She is not quite sure what else to do to help her joint pain but is here to discuss this further   ROS: Per HPI  Allergies  Allergen Reactions   Dofetilide Other (See Comments)    Passed out   Doxycycline      diarrhea    Ozempic  (0.25 Or 0.5 Mg-Dose) [Semaglutide (0.25 Or 0.5mg -Dos)] Other (See Comments)    Not tolerated   Amlodipine      Other reaction(s): Other (See Comments) Swollen ankles   Dronedarone Other (See Comments)    Increased BG   Glipizide -Metformin  Hcl     Other reaction(s): Diarrhea, Other (See Comments) Weakness   Metformin      Other reaction(s): Diarrhea   Niaspan [Niacin] Rash   Past Medical History:  Diagnosis Date   A-fib (HCC)    Allergy    seasonal   Anxiety    Arthritis    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 May substitute to any manufacturer covered by patient's insurance., Disp: 1 each, Rfl: 0   cholecalciferol (VITAMIN D ) 1000 UNITS tablet, Take 1,000 Units by mouth daily.,  Disp: , Rfl:    diclofenac  Sodium (VOLTAREN ) 1 % GEL, APPLY 2 GRAMS TOPICALLY 4 (FOUR) TIMES DAILY. (AS NEEDED FOR JOINT PAIN), Disp: 400 g, Rfl: 3   diltiazem  (CARDIZEM  CD) 120 MG 24 hr capsule, TAKE 1 CAPSULE BY MOUTH EVERY DAY, Disp: 90 capsule, Rfl: 1   ezetimibe  (ZETIA ) 10 MG tablet, TAKE 1 TABLET BY MOUTH EVERY DAY, Disp: 90 tablet, Rfl: 1   FARXIGA  10 MG TABS tablet, TAKE 1 TABLET (10 MG TOTAL) BY MOUTH DAILY BEFORE BREAKFAST. TO REPLACE JARDIANCE , Disp: 90 tablet, Rfl: 0   fluticasone  (FLONASE ) 50 MCG/ACT nasal spray, Place 2 sprays into both nostrils daily., Disp: 48 mL, Rfl: 1   furosemide  (LASIX ) 40 MG tablet, TAKE 1 TABLET (40 MG TOTAL) BY MOUTH DAILY AS NEEDED FOR FLUID OR EDEMA., Disp: 90 tablet, Rfl: 0   gabapentin  (NEURONTIN ) 100 MG capsule, TAKE 2 CAPSULES BY MOUTH 3 TIMES A DAY, Disp: 180 capsule, Rfl: 0   glucose blood (ACCU-CHEK AVIVA PLUS) test strip, CHECK BLOOD SUGAR ONCE A DAY  Dx E11.9, Disp: 100 strip, Rfl: 3   Glucose Blood (BLOOD GLUCOSE TEST STRIPS) STRP, Check BGS daily Ell.9. May substitute to any manufacturer covered by patient's insurance., Disp: 100 strip, Rfl: 4  hydrocortisone  (ANUSOL -HC) 2.5 % rectal cream, PLACE 1 APPLICATION RECTALLY AS NEEDED FOR HEMORRHOID FLARES- MAY USE 7 CONSECUTIVE DAYS, Disp: 30 g, Rfl: 1   JANUVIA  100 MG tablet, TAKE 1 TABLET BY MOUTH EVERY DAY, Disp: 90 tablet, Rfl: 0   JUBLIA  10 % SOLN, APPLY TO AFFECTED AREAS DAILY AS DIRECTED FOR NAIL FUNGUS X48 WEEKS, Disp: 8 mL, Rfl: 3   Lancet Device MISC, Check BGs daily E11.9 May substitute to any manufacturer covered by patient's insurance., Disp: 1 each, Rfl: 0   Lancets Misc. MISC, Check BGs daily E11.9 May substitute to any manufacturer covered by patient's insurance., Disp: 100 each, Rfl: 4   levothyroxine  (SYNTHROID ) 125 MCG tablet, TAKE 1 TABLET BY MOUTH EVERY DAY BEFORE BREAKFAST-OK TO CHANGE MANUFACTURER, Disp: 90 tablet, Rfl: 2   lisinopril  (ZESTRIL ) 2.5 MG tablet, TAKE 1 TABLET BY  MOUTH EVERY DAY, Disp: 100 tablet, Rfl: 0   magnesium oxide (MAG-OX) 400 MG tablet, 400 mg., Disp: , Rfl:    Potassium Chloride  ER 20 MEQ TBCR, TAKE 1 TABLET BY MOUTH EVERY DAY, Disp: 90 tablet, Rfl: 0   PREMARIN  vaginal cream, USING APPLICATOR PLACE 1 GRAM IN VAGINA EVERY OTHER NIGHT AS INSTRUCTED, Disp: 30 g, Rfl: 3   TETRAHYDROZOLINE HCL OP, Apply to eye. Reported on 01/02/2016, Disp: , Rfl:    XARELTO  15 MG TABS tablet, TAKE 1 TABLET (15 MG TOTAL) BY MOUTH DAILY WITH SUPPER, Disp: 90 tablet, Rfl: 0   lidocaine  (LIDODERM ) 5 %, Place 1 patch onto the skin daily. Remove & Discard patch within 12 hours or as directed by MD (Patient not taking: Reported on 05/05/2024), Disp: 30 patch, Rfl: 3   lidocaine  (XYLOCAINE ) 2 % jelly, Apply 1 application topically 3 (three) times daily. Apply to hemorrhoid when painful (Patient not taking: Reported on 05/05/2024), Disp: 30 mL, Rfl: 1 Social History   Socioeconomic History   Marital status: Divorced    Spouse name: Not on file   Number of children: 2   Years of education: 12   Highest education level: High school graduate  Occupational History   Occupation: Retired  Tobacco Use   Smoking status: Never   Smokeless tobacco: Never  Vaping Use   Vaping status: Never Used  Substance and Sexual Activity   Alcohol use: No   Drug use: No   Sexual activity: Not Currently  Other Topics Concern   Not on file  Social History Narrative   Lives alone. Children live nearby   Social Drivers of Health   Financial Resource Strain: Low Risk  (11/11/2023)   Overall Financial Resource Strain (CARDIA)    Difficulty of Paying Living Expenses: Not hard at all  Food Insecurity: Low Risk  (03/24/2024)   Received from [facility]   Hunger Vital Sign    Within the past 12 months, you worried that your food would run out before you got money to buy more: Never true    Within the past 12 months, the food you bought just didn't last and you didn't have money to get more.  : Never true  Transportation Needs: No Transportation Needs (03/24/2024)   Received from [facility]    In the past 12 months, has lack of reliable transportation kept you from medical appointments, meetings, work or from getting things needed for daily living? : No  Physical Activity: Insufficiently Active (11/11/2023)   Exercise Vital Sign    Days of Exercise per Week: 7 days  Minutes of Exercise per Session: 10 min  Stress: No Stress Concern Present (11/11/2023)   [employer] of Occupational Health - Occupational Stress Questionnaire    Feeling of Stress : Not at all  Social Connections: Socially Isolated (11/11/2023)   Social Connection and Isolation Panel    Frequency of Communication with Friends and Family: More than three times a week    Frequency of Social Gatherings with Friends and Family: More than three times a week    Attends Religious Services: Never    Database Administrator or Organizations: No    Attends Banker Meetings: Never    Marital Status: Divorced  Catering Manager Violence: Not At Risk (11/11/2023)   Humiliation, Afraid, Rape, and Kick questionnaire    Fear of Current or Ex-Partner: No    Emotionally Abused: No    Physically Abused: No    Sexually Abused: No   Family History  Problem Relation Age of Onset   Stroke Mother    Diabetes Mother    Heart disease Father    Atrial fibrillation Father    Cancer Sister        ovarian / colon   Diabetes Sister    Heart disease Brother    Hyperlipidemia Brother    Hypertension Brother    Diabetes Brother    Heart attack Brother 72       had to perform emergency surgery   Dementia Brother    Hyperlipidemia Sister    Cancer Sister        liver   Hyperlipidemia Sister    Diabetes Sister        Boarderline DM   Cancer Sister    Diabetes Sister    Diabetes Daughter    Rheum arthritis Daughter    Psoriasis Daughter     Objective: Office vital signs reviewed. BP (!)  147/68   Pulse 65   Temp 97.7 F (36.5 C)   Ht 5' 4 (1.626 m)   Wt 153 lb 2 oz (69.5 kg)   SpO2 97%   BMI 26.28 kg/m   Physical Examination:  General: Awake, alert, well nourished, No acute distress MSK: Osteoarthritic changes noted to the hands bilaterally with no appreciable deformity or deviations of the hands.  There is no gross joint swelling, erythema or warmth today.  She has full active range of motion of the fingers  Assessment/ Plan: 83 y.o. female   Polyarthralgia - Plan: C-reactive protein, Sedimentation Rate, ANA w/Reflex if Positive, Rheumatoid factor, Uric Acid, CBC with Differential, HYDROcodone -acetaminophen  (NORCO/VICODIN) 5-325 MG tablet  Stage 3a chronic kidney disease (HCC) - Plan: HYDROcodone -acetaminophen  (NORCO/VICODIN) 5-325 MG tablet  Chronic anticoagulation - Plan: HYDROcodone -acetaminophen  (NORCO/VICODIN) 5-325 MG tablet  Encounter for immunization - Plan: Flu vaccine HIGH DOSE PF(Fluzone Trivalent)   Given CKD will evaluate uric acid level and I will also order autoimmune labs just to rule this out as causation.  Sadly because of the CKD, chronic anticoagulation she is not able to take oral NSAIDs and Tylenol /topicals have not been sufficient in controlling pain.  I have given her a small amount of Norco to have and we discussed the risk of this medication as well as my recommendations for very sparing use.  She is to utilize a stool softener with each use.  Ideally start with just 1/2 tablet and may advance to 1 full tablet if needed per day.  We discussed not driving as it can cause sedation/dizziness.  She will follow-up with me  again in 2 weeks and we will plan for controlled substance contract and urine drug screen should she decide to continue this medication.  The national narcotic database was reviewed today and there were no red flags  Influenza vaccination administered   Norene CHRISTELLA Fielding, DO [facility] [phone]

## 2024-05-06 ENCOUNTER — Ambulatory Visit: Payer: Self-pay | Admitting: Family Medicine

## 2024-05-06 ENCOUNTER — Telehealth: Payer: Self-pay

## 2024-05-06 LAB — C-REACTIVE PROTEIN: CRP: 3 mg/L (ref 0–10)

## 2024-05-06 LAB — CBC WITH DIFFERENTIAL/PLATELET
Basophils Absolute: 0.1 x10E3/uL (ref 0.0–0.2)
Basos: 1 %
EOS (ABSOLUTE): 0.1 x10E3/uL (ref 0.0–0.4)
Eos: 2 %
Hematocrit: 39.1 % (ref 34.0–46.6)
Hemoglobin: 13 g/dL (ref 11.1–15.9)
Immature Grans (Abs): 0 x10E3/uL (ref 0.0–0.1)
Immature Granulocytes: 0 %
Lymphocytes Absolute: 1.7 x10E3/uL (ref 0.7–3.1)
Lymphs: 33 %
MCH: 31.3 pg (ref 26.6–33.0)
MCHC: 33.2 g/dL (ref 31.5–35.7)
MCV: 94 fL (ref 79–97)
Monocytes Absolute: 0.7 x10E3/uL (ref 0.1–0.9)
Monocytes: 13 %
Neutrophils Absolute: 2.6 x10E3/uL (ref 1.4–7.0)
Neutrophils: 51 %
Platelets: 222 x10E3/uL (ref 150–450)
RBC: 4.16 x10E6/uL (ref 3.77–5.28)
RDW: 13.1 % (ref 11.7–15.4)
WBC: 5.2 x10E3/uL (ref 3.4–10.8)

## 2024-05-06 LAB — RHEUMATOID FACTOR: Rheumatoid fact SerPl-aCnc: <10 [IU]/mL (ref <14.0)

## 2024-05-06 LAB — URIC ACID: Uric Acid: 6.5 mg/dL (ref 3.1–7.9)

## 2024-05-06 LAB — ANA W/REFLEX IF POSITIVE

## 2024-05-06 LAB — SEDIMENTATION RATE: Sed Rate: 15 mm/h (ref 0–40)

## 2024-05-06 NOTE — Telephone Encounter (Signed)
 Result encounter updated

## 2024-05-06 NOTE — Telephone Encounter (Signed)
 Copied from CRM [id number]. Topic: Clinical - Lab/Test Results >> May 06, 2024  2:52 PM Mia F wrote: Reason for CRM: Pt has called back and lab results were relayed word from word. PT had no questions or concerns at this time

## 2024-05-10 ENCOUNTER — Other Ambulatory Visit: Payer: Self-pay | Admitting: *Deleted

## 2024-05-10 DIAGNOSIS — I152 Hypertension secondary to endocrine disorders: Secondary | ICD-10-CM

## 2024-05-10 DIAGNOSIS — G8929 Other chronic pain: Secondary | ICD-10-CM

## 2024-05-15 ENCOUNTER — Other Ambulatory Visit: Payer: Self-pay | Admitting: *Deleted

## 2024-05-15 DIAGNOSIS — I152 Hypertension secondary to endocrine disorders: Secondary | ICD-10-CM

## 2024-05-20 ENCOUNTER — Ambulatory Visit (INDEPENDENT_AMBULATORY_CARE_PROVIDER_SITE_OTHER): Admitting: Family Medicine

## 2024-05-20 ENCOUNTER — Encounter: Payer: Self-pay | Admitting: Family Medicine

## 2024-05-20 VITALS — BP 159/67 | HR 67 | Temp 97.6°F | Ht 64.0 in | Wt 151.0 lb

## 2024-05-20 DIAGNOSIS — G5603 Carpal tunnel syndrome, bilateral upper limbs: Secondary | ICD-10-CM

## 2024-05-20 DIAGNOSIS — G8929 Other chronic pain: Secondary | ICD-10-CM | POA: Diagnosis not present

## 2024-05-20 DIAGNOSIS — M5441 Lumbago with sciatica, right side: Secondary | ICD-10-CM

## 2024-05-20 DIAGNOSIS — M2550 Pain in unspecified joint: Secondary | ICD-10-CM | POA: Diagnosis not present

## 2024-05-20 MED ORDER — PREDNISONE 10 MG (21) PO TBPK: ORAL_TABLET | ORAL | 0 refills | Status: DC

## 2024-05-20 NOTE — Progress Notes (Signed)
 Subjective: CC: Follow-up chronic back pain, polyarthralgia PCP: Jolinda Norene HERO, DO YEP:Ziwj H Dillin is a 83 y.o. female presenting to clinic today for:  Patient was seen 2 weeks ago for chronic back pain, polyarthralgia.  Symptoms were refractory to scheduled Tylenol , multiple OTC analgesics including NSAID topicals.  She is not a candidate for oral NSAIDs secondary to CKD and chronic anticoagulation.  Tramadol  has made her feel poorly in the past.  Autoimmune and gout arthritis workup negative.  Treated with Norco for once daily use as needed.  She is here to follow-up on usage and tolerance of medication.  She reports she never did start the medication despite having picked it up because the pharmacist scared her about the medicine and she has just been relying on Tylenol .  She would like to just maybe do a prednisone  Dosepak to see if that might help but understands it is not a long-term treatment plan.  She reports bilateral hand pain and was previously seen by Dr. Cheryle who had diagnosed her with carpal tunnel of the right hand.  She think she has this on the left side as well now   ROS: Per HPI  Allergies  Allergen Reactions   Dofetilide Other (See Comments)    Passed out   Doxycycline      diarrhea    Ozempic  (0.25 Or 0.5 Mg-Dose) [Semaglutide (0.25 Or 0.5mg -Dos)] Other (See Comments)    Not tolerated   Amlodipine      Other reaction(s): Other (See Comments) Swollen ankles   Dronedarone Other (See Comments)    Increased BG   Glipizide -Metformin  Hcl     Other reaction(s): Diarrhea, Other (See Comments) Weakness   Metformin      Other reaction(s): Diarrhea   Niaspan [Niacin] Rash   Past Medical History:  Diagnosis Date   A-fib (HCC)    Allergy    seasonal   Anxiety    Arthritis    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 May substitute to any manufacturer covered by patient's insurance., Disp: 1 each, Rfl: 0   cholecalciferol (VITAMIN D ) 1000 UNITS tablet, Take 1,000 Units by mouth daily., Disp: , Rfl:    diclofenac  Sodium (VOLTAREN ) 1 % GEL, APPLY 2 GRAMS TOPICALLY 4 (FOUR) TIMES DAILY. (AS NEEDED FOR JOINT PAIN), Disp: 400 g, Rfl: 3   diltiazem  (CARDIZEM  CD) 120 MG 24 hr capsule, TAKE 1 CAPSULE BY MOUTH EVERY DAY, Disp: 90 capsule, Rfl: 1   ezetimibe  (ZETIA ) 10 MG tablet, TAKE 1 TABLET BY MOUTH EVERY DAY, Disp: 90 tablet, Rfl: 1   FARXIGA  10 MG TABS tablet, TAKE 1 TABLET (10 MG TOTAL) BY MOUTH DAILY BEFORE BREAKFAST. TO REPLACE JARDIANCE , Disp: 90 tablet, Rfl: 0   fluticasone  (FLONASE ) 50 MCG/ACT nasal spray, Place 2 sprays into both nostrils daily., Disp: 48 mL, Rfl: 1   furosemide  (LASIX ) 40 MG tablet, TAKE 1 TABLET (40 MG TOTAL) BY MOUTH DAILY AS NEEDED FOR FLUID OR EDEMA., Disp: 90 tablet, Rfl:  0   gabapentin  (NEURONTIN ) 100 MG capsule, TAKE 2 CAPSULES BY MOUTH 3 TIMES A DAY, Disp: 180 capsule, Rfl: 0   glucose blood (ACCU-CHEK AVIVA PLUS) test strip, CHECK BLOOD SUGAR ONCE A DAY  Dx E11.9, Disp: 100 strip, Rfl: 3   Glucose Blood (BLOOD GLUCOSE TEST STRIPS) STRP, Check BGS daily Ell.9. May substitute to any manufacturer covered by patient's insurance., Disp: 100 strip, Rfl: 4   HYDROcodone -acetaminophen  (NORCO/VICODIN) 5-325 MG tablet, Take 0.5-1 tablets by mouth 3 (three) times daily as needed for severe pain (pain score 7-10) (take with stool softener)., Disp: 15 tablet, Rfl: 0   hydrocortisone  (ANUSOL -HC) 2.5 % rectal cream, PLACE 1 APPLICATION RECTALLY AS NEEDED FOR HEMORRHOID FLARES- MAY USE 7 CONSECUTIVE DAYS, Disp: 30 g, Rfl: 1    JANUVIA  100 MG tablet, TAKE 1 TABLET BY MOUTH EVERY DAY, Disp: 90 tablet, Rfl: 0   JUBLIA  10 % SOLN, APPLY TO AFFECTED AREAS DAILY AS DIRECTED FOR NAIL FUNGUS X48 WEEKS, Disp: 8 mL, Rfl: 3   Lancet Device MISC, Check BGs daily E11.9 May substitute to any manufacturer covered by patient's insurance., Disp: 1 each, Rfl: 0   Lancets Misc. MISC, Check BGs daily E11.9 May substitute to any manufacturer covered by patient's insurance., Disp: 100 each, Rfl: 4   levothyroxine  (SYNTHROID ) 125 MCG tablet, TAKE 1 TABLET BY MOUTH EVERY DAY BEFORE BREAKFAST-OK TO CHANGE MANUFACTURER, Disp: 90 tablet, Rfl: 2   lisinopril  (ZESTRIL ) 2.5 MG tablet, TAKE 1 TABLET BY MOUTH EVERY DAY, Disp: 100 tablet, Rfl: 0   magnesium oxide (MAG-OX) 400 MG tablet, 400 mg., Disp: , Rfl:    Potassium Chloride  ER 20 MEQ TBCR, TAKE 1 TABLET BY MOUTH EVERY DAY, Disp: 90 tablet, Rfl: 0   predniSONE  (STERAPRED UNI-PAK 21 TAB) 10 MG (21) TBPK tablet, As directed x 6 days, Disp: 21 tablet, Rfl: 0   PREMARIN  vaginal cream, USING APPLICATOR PLACE 1 GRAM IN VAGINA EVERY OTHER NIGHT AS INSTRUCTED, Disp: 30 g, Rfl: 3   TETRAHYDROZOLINE HCL OP, Apply to eye. Reported on 01/02/2016, Disp: , Rfl:    XARELTO  15 MG TABS tablet, TAKE 1 TABLET (15 MG TOTAL) BY MOUTH DAILY WITH SUPPER, Disp: 90 tablet, Rfl: 0   lidocaine  (LIDODERM ) 5 %, Place 1 patch onto the skin daily. Remove & Discard patch within 12 hours or as directed by MD (Patient not taking: Reported on 05/20/2024), Disp: 30 patch, Rfl: 3   lidocaine  (XYLOCAINE ) 2 % jelly, Apply 1 application topically 3 (three) times daily. Apply to hemorrhoid when painful (Patient not taking: Reported on 05/20/2024), Disp: 30 mL, Rfl: 1 Social History   Socioeconomic History   Marital status: Divorced    Spouse name: Not on file   Number of children: 2   Years of education: 12   Highest education level: High school graduate  Occupational History   Occupation: Retired  Tobacco Use   Smoking status: Never    Smokeless tobacco: Never  Vaping Use   Vaping status: Never Used  Substance and Sexual Activity   Alcohol use: No   Drug use: No   Sexual activity: Not Currently  Other Topics Concern   Not on file  Social History Narrative   Lives alone. Children live nearby   Social Drivers of Health   Financial Resource Strain: Low Risk  (11/11/2023)   Overall Financial Resource Strain (CARDIA)    Difficulty of Paying Living Expenses: Not hard at all  Food Insecurity: Low Risk  (03/24/2024)   Received  from [facility]   Hunger Vital Sign    Within the past 12 months, you worried that your food would run out before you got money to buy more: Never true    Within the past 12 months, the food you bought just didn't last and you didn't have money to get more. : Never true  Transportation Needs: No Transportation Needs (03/24/2024)   Received from [facility]    In the past 12 months, has lack of reliable transportation kept you from medical appointments, meetings, work or from getting things needed for daily living? : No  Physical Activity: Insufficiently Active (11/11/2023)   Exercise Vital Sign    Days of Exercise per Week: 7 days    Minutes of Exercise per Session: 10 min  Stress: No Stress Concern Present (11/11/2023)   [employer] of Occupational Health - Occupational Stress Questionnaire    Feeling of Stress : Not at all  Social Connections: Socially Isolated (11/11/2023)   Social Connection and Isolation Panel    Frequency of Communication with Friends and Family: More than three times a week    Frequency of Social Gatherings with Friends and Family: More than three times a week    Attends Religious Services: Never    Database Administrator or Organizations: No    Attends Banker Meetings: Never    Marital Status: Divorced  Catering Manager Violence: Not At Risk (11/11/2023)   Humiliation, Afraid, Rape, and Kick questionnaire    Fear of Current or  Ex-Partner: No    Emotionally Abused: No    Physically Abused: No    Sexually Abused: No   Family History  Problem Relation Age of Onset   Stroke Mother    Diabetes Mother    Heart disease Father    Atrial fibrillation Father    Cancer Sister        ovarian / colon   Diabetes Sister    Heart disease Brother    Hyperlipidemia Brother    Hypertension Brother    Diabetes Brother    Heart attack Brother 69       had to perform emergency surgery   Dementia Brother    Hyperlipidemia Sister    Cancer Sister        liver   Hyperlipidemia Sister    Diabetes Sister        Boarderline DM   Cancer Sister    Diabetes Sister    Diabetes Daughter    Rheum arthritis Daughter    Psoriasis Daughter     Objective: Office vital signs reviewed. BP (!) 159/67   Pulse 67   Temp 97.6 F (36.4 C)   Ht 5' 4 (1.626 m)   Wt 151 lb (68.5 kg)   SpO2 95%   BMI 25.92 kg/m   Physical Examination:  General: Awake, alert, well nourished, No acute distress HEENT: sclera white, MMM MSK: Ambulates with use of cane.  Gait is slightly antalgic.  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  Assessment/ Plan: 83 y.o. female   Chronic right-sided low back pain with right-sided sciatica - Plan: predniSONE  (STERAPRED UNI-PAK 21 TAB) 10 MG (21) TBPK tablet, Ambulatory referral to Orthopedic Surgery  Polyarthralgia - Plan: predniSONE  (STERAPRED UNI-PAK 21 TAB) 10 MG (21) TBPK tablet, Ambulatory referral to Orthopedic Surgery  Bilateral carpal tunnel syndrome - Plan: Ambulatory referral to Orthopedic Surgery   We discussed limiting use of oral corticosteroids given risk  associated with them, particularly increased risk of osteoporosis etc. I have placed referral back to her specialist for follow-up on carpal tunnel, which she apparently has seen him for before.  She still has Norco on hand if she needs it for severe pain.  She will follow-up at her next scheduled office visit  or as needed going forward   Norene CHRISTELLA Fielding, DO [facility] [phone]

## 2024-06-15 ENCOUNTER — Ambulatory Visit: Payer: Self-pay | Admitting: Family Medicine

## 2024-06-15 ENCOUNTER — Encounter: Payer: Self-pay | Admitting: Family Medicine

## 2024-06-15 VITALS — BP 162/84 | HR 72 | Temp 97.5°F | Ht 64.0 in | Wt 153.2 lb

## 2024-06-15 DIAGNOSIS — Z7901 Long term (current) use of anticoagulants: Secondary | ICD-10-CM | POA: Diagnosis not present

## 2024-06-15 DIAGNOSIS — J302 Other seasonal allergic rhinitis: Secondary | ICD-10-CM | POA: Diagnosis not present

## 2024-06-15 DIAGNOSIS — N1831 Chronic kidney disease, stage 3a: Secondary | ICD-10-CM

## 2024-06-15 DIAGNOSIS — N3000 Acute cystitis without hematuria: Secondary | ICD-10-CM | POA: Diagnosis not present

## 2024-06-15 DIAGNOSIS — I152 Hypertension secondary to endocrine disorders: Secondary | ICD-10-CM | POA: Diagnosis not present

## 2024-06-15 DIAGNOSIS — Z7984 Long term (current) use of oral hypoglycemic drugs: Secondary | ICD-10-CM | POA: Diagnosis not present

## 2024-06-15 DIAGNOSIS — E034 Atrophy of thyroid (acquired): Secondary | ICD-10-CM | POA: Diagnosis not present

## 2024-06-15 DIAGNOSIS — L84 Corns and callosities: Secondary | ICD-10-CM

## 2024-06-15 DIAGNOSIS — E1122 Type 2 diabetes mellitus with diabetic chronic kidney disease: Secondary | ICD-10-CM | POA: Diagnosis not present

## 2024-06-15 DIAGNOSIS — E785 Hyperlipidemia, unspecified: Secondary | ICD-10-CM | POA: Diagnosis not present

## 2024-06-15 DIAGNOSIS — E1169 Type 2 diabetes mellitus with other specified complication: Secondary | ICD-10-CM | POA: Diagnosis not present

## 2024-06-15 DIAGNOSIS — E1159 Type 2 diabetes mellitus with other circulatory complications: Secondary | ICD-10-CM | POA: Diagnosis not present

## 2024-06-15 LAB — URINALYSIS, ROUTINE W REFLEX MICROSCOPIC
Bilirubin, UA: NEGATIVE
Ketones, UA: NEGATIVE
Nitrite, UA: NEGATIVE
Protein,UA: NEGATIVE
Specific Gravity, UA: <=1.005 — AB (ref 1.005–1.030)
Urobilinogen, Ur: 0.2 mg/dL (ref 0.2–1.0)
pH, UA: 6 (ref 5.0–7.5)

## 2024-06-15 LAB — BAYER DCA HB A1C WAIVED: HB A1C (BAYER DCA - WAIVED): 7.1 % — ABNORMAL HIGH (ref 4.8–5.6)

## 2024-06-15 LAB — MICROSCOPIC EXAMINATION
Renal Epithel, UA: NONE SEEN /HPF
Yeast, UA: NONE SEEN

## 2024-06-15 MED ORDER — LISINOPRIL 5 MG PO TABS: 5.0000 mg | ORAL_TABLET | Freq: Every day | ORAL | 3 refills | Status: AC

## 2024-06-15 MED ORDER — POTASSIUM CHLORIDE ER 20 MEQ PO TBCR: 1.0000 | EXTENDED_RELEASE_TABLET | Freq: Every day | ORAL | 3 refills | Status: AC

## 2024-06-15 MED ORDER — RIVAROXABAN 15 MG PO TABS: 15.0000 mg | ORAL_TABLET | Freq: Every day | ORAL | 3 refills | Status: AC

## 2024-06-15 MED ORDER — EZETIMIBE 10 MG PO TABS: 10.0000 mg | ORAL_TABLET | Freq: Every day | ORAL | 3 refills | Status: AC

## 2024-06-15 MED ORDER — IPRATROPIUM BROMIDE 0.06 % NA SOLN: 2.0000 | Freq: Three times a day (TID) | NASAL | 12 refills | Status: AC | PRN

## 2024-06-15 MED ORDER — CEFDINIR 300 MG PO CAPS: 300.0000 mg | ORAL_CAPSULE | Freq: Two times a day (BID) | ORAL | 0 refills | Status: AC

## 2024-06-15 MED ORDER — DAPAGLIFLOZIN PROPANEDIOL 10 MG PO TABS: 10.0000 mg | ORAL_TABLET | Freq: Every day | ORAL | 3 refills | Status: AC

## 2024-06-15 MED ORDER — DILTIAZEM HCL ER COATED BEADS 120 MG PO CP24: 120.0000 mg | ORAL_CAPSULE | Freq: Every day | ORAL | 3 refills | Status: AC

## 2024-06-15 MED ORDER — SITAGLIPTIN PHOSPHATE 100 MG PO TABS: 100.0000 mg | ORAL_TABLET | Freq: Every day | ORAL | 3 refills | Status: AC

## 2024-06-15 MED ORDER — FLUCONAZOLE 150 MG PO TABS: 150.0000 mg | ORAL_TABLET | Freq: Once | ORAL | 0 refills | Status: AC

## 2024-06-15 NOTE — Progress Notes (Signed)
 Subjective: CC:DM PCP: Jolinda Meredith HERO, DO YEP:Meredith Anderson is a 83 y.o. female presenting to clinic today for:  Type 2 Diabetes with hypertension, hyperlipidemia w/ CKD3a:  Patient reports compliance with her Farxiga , Januvia , lisinopril , Lasix , Cardizem  and Zetia .  She thinks that maybe her A-fib may be acting up a little bit because her heart feels like it has been palpating.  She does not report any chest pain, shortness of breath, visual disturbance or bleeding with the blood thinner.  She does report some vaginitis and dysuria over the last 1 to 2 weeks.  She utilized Monistat today and was treated with Diflucan  a few weeks ago.   Diabetes Health Maintenance Due  Topic Date Due   FOOT EXAM  05/23/2024   HEMOGLOBIN A1C  07/12/2024   OPHTHALMOLOGY EXAM  01/09/2025    Main concern today is that she has had some sinus stuffiness that is refractory to Flonase .  Not taking any oral antihistamines.  No fevers reported.  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    Cataract    Depression 12/12/2015   Dyshydrosis    Hemorrhoids    Hyperlipidemia    Hypertension    Menopause    NIDDM (non-insulin dependent diabetes mellitus)    diet controlled    Osteopenia 2016   Syncope 11/08/2015   Thyroid  disease    Current Medications[2] Social History   Socioeconomic History   Marital status: Divorced    Spouse name: Not on file   Number of children: 2   Years of education: 12   Highest education level: High school graduate  Occupational History   Occupation: Retired  Tobacco Use   Smoking status: Never   Smokeless tobacco: Never  Vaping Use   Vaping status: Never Used  Substance and Sexual Activity   Alcohol use: No   Drug use: No   Sexual activity: Not Currently  Other Topics Concern   Not on file  Social History Narrative   Lives  alone. Children live nearby   Social Drivers of Health   Tobacco Use: Low Risk (05/20/2024)   Patient History    Smoking Tobacco Use: Never    Smokeless Tobacco Use: Never    Passive Exposure: Not on file  Financial Resource Strain: Low Risk (11/11/2023)   Overall Financial Resource Strain (CARDIA)    Difficulty of Paying Living Expenses: Not hard at all  Food Insecurity: Low Risk (03/24/2024)   Received from [facility]   Epic    Within the past 12 months, you worried that your food would run out before you got money to buy more: Never true    Within the past 12 months, the food you bought just didn't last and you didn't have money to get more. : Never true  Transportation Needs: No Transportation Needs (03/24/2024)   Received from [employer]    In the past 12 months, has lack of reliable transportation kept you from medical appointments, meetings, work or from getting things needed for daily living? : No  Physical Activity: Insufficiently Active (11/11/2023)   Exercise Vital Sign    Days of Exercise per Week: 7 days    Minutes of Exercise per Session: 10 min  Stress: No Stress Concern Present (11/11/2023)  [employer] of Occupational Health - Occupational Stress Questionnaire    Feeling of Stress : Not at all  Social Connections: Socially Isolated (11/11/2023)   Social Connection and Isolation Panel    Frequency of Communication with Friends and Family: More than three times a week    Frequency of Social Gatherings with Friends and Family: More than three times a week    Attends Religious Services: Never    Database Administrator or Organizations: No    Attends Banker Meetings: Never    Marital Status: Divorced  Catering Manager Violence: Not At Risk (11/11/2023)   Humiliation, Afraid, Rape, and Kick questionnaire    Fear of Current or Ex-Partner: No    Emotionally Abused: No    Physically Abused: No    Sexually Abused: No  Depression  (PHQ2-9): Low Risk (05/20/2024)   Depression (PHQ2-9)    PHQ-2 Score: 0  Alcohol Screen: Low Risk (11/11/2023)   Alcohol Screen    Last Alcohol Screening Score (AUDIT): 0  Housing: Low Risk (03/24/2024)   Received from [facility]   Epic    What is your living situation today?: I have a steady place to live    Think about the place you live. Do you have problems with any of the following? Choose all that apply:: None/None on this list  Utilities: Low Risk (03/24/2024)   Received from [facility]   Utilities    In the past 12 months has the electric, gas, oil, or water company threatened to shut off services in your home? : No  Health Literacy: Adequate Health Literacy (11/11/2023)   B1300 Health Literacy    Frequency of need for help with medical instructions: Never   Family History  Problem Relation Age of Onset   Stroke Mother    Diabetes Mother    Heart disease Father    Atrial fibrillation Father    Cancer Sister        ovarian / colon   Diabetes Sister    Heart disease Brother    Hyperlipidemia Brother    Hypertension Brother    Diabetes Brother    Heart attack Brother 85       had to perform emergency surgery   Dementia Brother    Hyperlipidemia Sister    Cancer Sister        liver   Hyperlipidemia Sister    Diabetes Sister        Boarderline DM   Cancer Sister    Diabetes Sister    Diabetes Daughter    Rheum arthritis Daughter    Psoriasis Daughter     Objective: Office vital signs reviewed. BP (!) 162/84   Pulse 72   Temp (!) 97.5 F (36.4 C)   Ht 5' 4 (1.626 m)   Wt 153 lb 4 oz (69.5 kg)   SpO2 97%   BMI 26.31 kg/m   Physical Examination:  General: Awake, alert, nontoxic-appearing female, No acute distress HEENT: Sclera white.  Moist mucous membranes Cardio: regular rate and rhythm, S1S2 heard, no murmurs appreciated Pulm: clear to auscultation bilaterally, no wheezes, rhonchi or rales; normal work of breathing on room air GU: No CVA  tenderness palpation.  Diabetic Foot Exam - Simple   Simple Foot Form Diabetic Foot exam was performed with the following findings: Yes   Visual Inspection See comments: Yes Sensation Testing Intact to touch and monofilament testing bilaterally: Yes Pulse Check Posterior Tibialis and Dorsalis pulse intact bilaterally: Yes  Comments Plantar callus noted along the left foot and a possible plantar wart noted along the mid left foot     Lab Results  Component Value Date   HGBA1C 7.0 (H) 01/10/2024    Assessment/ Plan: 83 y.o. female   Type 2 diabetes mellitus with stage 3a chronic kidney disease, without long-term current use of insulin (HCC) - Plan: Basic Metabolic Panel, Bayer DCA Hb A1c Waived, dapagliflozin  propanediol (FARXIGA ) 10 MG TABS tablet, sitaGLIPtin  (JANUVIA ) 100 MG tablet  Hyperlipidemia associated with type 2 diabetes mellitus (HCC) - Plan: Lipid Panel, ezetimibe  (ZETIA ) 10 MG tablet  Hypertension associated with diabetes (HCC) - Plan: Basic Metabolic Panel, lisinopril  (ZESTRIL ) 5 MG tablet, diltiazem  (CARDIZEM  CD) 120 MG 24 hr capsule, Potassium Chloride  ER 20 MEQ TBCR  Hypothyroidism due to acquired atrophy of thyroid  - Plan: TSH + free T4  Long term current use of anticoagulant therapy - Plan: Rivaroxaban  (XARELTO ) 15 MG TABS tablet  Acute cystitis without hematuria - Plan: Urinalysis, Routine w reflex microscopic, Urine Culture, cefdinir  (OMNICEF ) 300 MG capsule, fluconazole  (DIFLUCAN ) 150 MG tablet  Foot callus - Plan: Ambulatory referral to Podiatry  Seasonal allergic rhinitis, unspecified trigger - Plan: ipratropium (ATROVENT ) 0.06 % nasal spray  Sugar borderline but at goal for her age.  Continue medications as prescribed.  Renal sent.  Check renal function.  Foot exam performed today and demonstrated a foot callus I have referred her to podiatry.  She will continue Zetia .  Lipid panel collected.  No evidence of active A-fib currently but she should  continue blood thinner and Cardizem  as prescribed.  Blood pressure not at goal.  I advanced her lisinopril  to 5 mg daily and she will follow-up with me in the next 3 to 4 weeks, sooner if concerns arise  Check thyroid  levels  Urinalysis demonstrated white blood cells and few bacteria.  Given reports of symptomology and use of Farxiga , would like her to proceed with oral antibiotics.  Hold Farxiga  while taking.  Push oral fluids.  Diflucan  sent for as needed use  Atrovent  to replace Flonase  for seasonal allergic rhinitis.   Meredith CHRISTELLA Fielding, DO [facility] [phone]     [1]  Allergies Allergen Reactions   Dofetilide Other (See Comments)    Passed out   Doxycycline      diarrhea    Ozempic  (0.25 Or 0.5 Mg-Dose) [Semaglutide (0.25 Or 0.5mg -Dos)] Other (See Comments)    Not tolerated   Amlodipine      Other reaction(s): Other (See Comments) Swollen ankles   Dronedarone Other (See Comments)    Increased BG   Glipizide -Metformin  Hcl     Other reaction(s): Diarrhea, Other (See Comments) Weakness   Metformin      Other reaction(s): Diarrhea   Niaspan [Niacin] Rash  [2]  Current Outpatient Medications:    acetaminophen  (TYLENOL ) 500 MG tablet, Take by mouth., Disp: , Rfl:    Blood Glucose Monitoring Suppl DEVI, Check BGs daily E11.9. May substitute to any manufacturer covered by patient's insurance., Disp: 1 each, Rfl: 0   cholecalciferol (VITAMIN D ) 1000 UNITS tablet, Take 1,000 Units by mouth daily., Disp: , Rfl:    diclofenac  Sodium (VOLTAREN ) 1 % GEL, APPLY 2 GRAMS TOPICALLY 4 (FOUR) TIMES DAILY. (AS NEEDED FOR JOINT PAIN), Disp: 400 g, Rfl: 3   diltiazem  (CARDIZEM  CD) 120 MG 24 hr capsule, TAKE 1 CAPSULE BY MOUTH EVERY DAY, Disp: 90 capsule, Rfl: 1   ezetimibe  (ZETIA ) 10 MG tablet, TAKE 1 TABLET BY MOUTH EVERY DAY,  Disp: 90 tablet, Rfl: 1   FARXIGA  10 MG TABS tablet, TAKE 1 TABLET (10 MG TOTAL) BY MOUTH DAILY BEFORE BREAKFAST. TO REPLACE JARDIANCE ,  Disp: 90 tablet, Rfl: 0   fluticasone  (FLONASE ) 50 MCG/ACT nasal spray, Place 2 sprays into both nostrils daily., Disp: 48 mL, Rfl: 1   furosemide  (LASIX ) 40 MG tablet, TAKE 1 TABLET (40 MG TOTAL) BY MOUTH DAILY AS NEEDED FOR FLUID OR EDEMA., Disp: 90 tablet, Rfl: 0   gabapentin  (NEURONTIN ) 100 MG capsule, TAKE 2 CAPSULES BY MOUTH 3 TIMES A DAY, Disp: 180 capsule, Rfl: 0   glucose blood (ACCU-CHEK AVIVA PLUS) test strip, CHECK BLOOD SUGAR ONCE A DAY  Dx E11.9, Disp: 100 strip, Rfl: 3   Glucose Blood (BLOOD GLUCOSE TEST STRIPS) STRP, Check BGS daily Ell.9. May substitute to any manufacturer covered by patient's insurance., Disp: 100 strip, Rfl: 4   HYDROcodone -acetaminophen  (NORCO/VICODIN) 5-325 MG tablet, Take 0.5-1 tablets by mouth 3 (three) times daily as needed for severe pain (pain score 7-10) (take with stool softener)., Disp: 15 tablet, Rfl: 0   hydrocortisone  (ANUSOL -HC) 2.5 % rectal cream, PLACE 1 APPLICATION RECTALLY AS NEEDED FOR HEMORRHOID FLARES- MAY USE 7 CONSECUTIVE DAYS, Disp: 30 g, Rfl: 1   JANUVIA  100 MG tablet, TAKE 1 TABLET BY MOUTH EVERY DAY, Disp: 90 tablet, Rfl: 0   JUBLIA  10 % SOLN, APPLY TO AFFECTED AREAS DAILY AS DIRECTED FOR NAIL FUNGUS X48 WEEKS, Disp: 8 mL, Rfl: 3   Lancet Device MISC, Check BGs daily E11.9 May substitute to any manufacturer covered by patient's insurance., Disp: 1 each, Rfl: 0   Lancets Misc. MISC, Check BGs daily E11.9 May substitute to any manufacturer covered by patient's insurance., Disp: 100 each, Rfl: 4   levothyroxine  (SYNTHROID ) 125 MCG tablet, TAKE 1 TABLET BY MOUTH EVERY DAY BEFORE BREAKFAST-OK TO CHANGE MANUFACTURER, Disp: 90 tablet, Rfl: 2   lidocaine  (LIDODERM ) 5 %, Place 1 patch onto the skin daily. Remove & Discard patch within 12 hours or as directed by MD (Patient not taking: Reported on 05/20/2024), Disp: 30 patch, Rfl: 3   lidocaine  (XYLOCAINE ) 2 % jelly, Apply 1 application topically 3 (three) times daily. Apply to hemorrhoid when  painful (Patient not taking: Reported on 05/20/2024), Disp: 30 mL, Rfl: 1   lisinopril  (ZESTRIL ) 2.5 MG tablet, TAKE 1 TABLET BY MOUTH EVERY DAY, Disp: 100 tablet, Rfl: 0   magnesium oxide (MAG-OX) 400 MG tablet, 400 mg., Disp: , Rfl:    Potassium Chloride  ER 20 MEQ TBCR, TAKE 1 TABLET BY MOUTH EVERY DAY, Disp: 90 tablet, Rfl: 0   predniSONE  (STERAPRED UNI-PAK 21 TAB) 10 MG (21) TBPK tablet, As directed x 6 days, Disp: 21 tablet, Rfl: 0   PREMARIN  vaginal cream, USING APPLICATOR PLACE 1 GRAM IN VAGINA EVERY OTHER NIGHT AS INSTRUCTED, Disp: 30 g, Rfl: 3   TETRAHYDROZOLINE HCL OP, Apply to eye. Reported on 01/02/2016, Disp: , Rfl:    XARELTO  15 MG TABS tablet, TAKE 1 TABLET (15 MG TOTAL) BY MOUTH DAILY WITH SUPPER, Disp: 90 tablet, Rfl: 0

## 2024-06-16 ENCOUNTER — Ambulatory Visit: Payer: Self-pay | Admitting: Family Medicine

## 2024-06-16 DIAGNOSIS — E1169 Type 2 diabetes mellitus with other specified complication: Secondary | ICD-10-CM

## 2024-06-16 LAB — LIPID PANEL
Chol/HDL Ratio: 5.7 ratio — ABNORMAL HIGH (ref 0.0–4.4)
Cholesterol, Total: 193 mg/dL (ref 100–199)
HDL: 34 mg/dL — ABNORMAL LOW (ref >39)
LDL Chol Calc (NIH): 90 mg/dL (ref 0–99)
Triglycerides: 417 mg/dL — ABNORMAL HIGH (ref 0–149)
VLDL Cholesterol Cal: 69 mg/dL — ABNORMAL HIGH (ref 5–40)

## 2024-06-16 LAB — BASIC METABOLIC PANEL WITH GFR
BUN/Creatinine Ratio: 15 (ref 12–28)
BUN: 15 mg/dL (ref 8–27)
CO2: 23 mmol/L (ref 20–29)
Calcium: 10.2 mg/dL (ref 8.7–10.3)
Chloride: 98 mmol/L (ref 96–106)
Creatinine, Ser: 0.97 mg/dL (ref 0.57–1.00)
Glucose: 134 mg/dL — ABNORMAL HIGH (ref 70–99)
Potassium: 3.7 mmol/L (ref 3.5–5.2)
Sodium: 139 mmol/L (ref 134–144)
eGFR: 58 mL/min/1.73 — ABNORMAL LOW (ref >59)

## 2024-06-16 LAB — TSH+FREE T4
Free T4: 1.35 ng/dL (ref 0.82–1.77)
TSH: 2.04 u[IU]/mL (ref 0.450–4.500)

## 2024-06-16 MED ORDER — FENOFIBRATE 145 MG PO TABS: 145.0000 mg | ORAL_TABLET | Freq: Every day | ORAL | 1 refills | Status: AC

## 2024-06-18 LAB — URINE CULTURE

## 2024-06-29 ENCOUNTER — Telehealth: Payer: Self-pay | Admitting: Family Medicine

## 2024-06-29 DIAGNOSIS — Z0279 Encounter for issue of other medical certificate: Secondary | ICD-10-CM

## 2024-06-29 NOTE — Telephone Encounter (Signed)
 Pt  dropped off handicap forms to be completed and signed.  Form Fee Paid? (Y/N)       yes     If NO, form is placed on front office manager desk to hold until payment received. If YES, then form will be placed in the RX/HH Nurse Coordinators box for completion.  Form will not be processed until payment is received

## 2024-07-13 ENCOUNTER — Other Ambulatory Visit (HOSPITAL_COMMUNITY): Payer: Self-pay

## 2024-07-13 DIAGNOSIS — R200 Anesthesia of skin: Secondary | ICD-10-CM

## 2024-08-18 ENCOUNTER — Ambulatory Visit (HOSPITAL_COMMUNITY)

## 2024-09-14 ENCOUNTER — Ambulatory Visit: Admitting: Family Medicine
